# Patient Record
Sex: Male | Born: 1980 | Race: White | Hispanic: No | Marital: Single | Smoking: Former smoker
Health system: Southern US, Community
[De-identification: ages and names within clinical notes are randomized; demographics above are authoritative.]

## PROBLEM LIST (undated history)

## (undated) ENCOUNTER — Emergency Department (HOSPITAL_COMMUNITY): Admission: EM | Payer: Self-pay

## (undated) DIAGNOSIS — F99 Mental disorder, not otherwise specified: Secondary | ICD-10-CM

## (undated) DIAGNOSIS — K920 Hematemesis: Secondary | ICD-10-CM

## (undated) DIAGNOSIS — F329 Major depressive disorder, single episode, unspecified: Secondary | ICD-10-CM

## (undated) DIAGNOSIS — IMO0002 Reserved for concepts with insufficient information to code with codable children: Secondary | ICD-10-CM

## (undated) DIAGNOSIS — I10 Essential (primary) hypertension: Secondary | ICD-10-CM

## (undated) DIAGNOSIS — F32A Depression, unspecified: Secondary | ICD-10-CM

## (undated) HISTORY — PX: SHOULDER SURGERY: SHX246

## (undated) HISTORY — PX: WISDOM TOOTH EXTRACTION: SHX21

---

## 2001-12-02 ENCOUNTER — Emergency Department (HOSPITAL_COMMUNITY): Admission: EM | Admit: 2001-12-02 | Discharge: 2001-12-02 | Payer: Self-pay

## 2002-06-03 ENCOUNTER — Emergency Department (HOSPITAL_COMMUNITY): Admission: EM | Admit: 2002-06-03 | Discharge: 2002-06-03 | Payer: Self-pay | Admitting: Emergency Medicine

## 2002-06-03 ENCOUNTER — Encounter: Payer: Self-pay | Admitting: Emergency Medicine

## 2011-09-13 ENCOUNTER — Other Ambulatory Visit: Payer: Self-pay | Admitting: Physician Assistant

## 2011-09-14 NOTE — Telephone Encounter (Signed)
Please advise patient that he needs OV and labs for additional refills. csj

## 2011-09-15 NOTE — Telephone Encounter (Signed)
LMOM notifying patient msg.  Home number has been d/c'd.

## 2011-11-18 ENCOUNTER — Telehealth: Payer: Self-pay

## 2011-11-18 MED ORDER — METOPROLOL TARTRATE 50 MG PO TABS: 50.0000 mg | ORAL_TABLET | Freq: Two times a day (BID) | ORAL | Status: DC

## 2011-11-18 NOTE — Telephone Encounter (Signed)
GAVE PT MESSAGE

## 2011-11-18 NOTE — Telephone Encounter (Signed)
Pt not seen in epic. Pt came in to ask about bp rx refill. Last ov 10/01/10. Spoke with ryan dunn - pt is totally out of bp rx but cannot afford ov today. Alycia Rossetti stated to update pts info with preferred pharmacy and he would ok 1 month - before more pt will be required to see provider. Gave pt [facility] financial aid # for assistance and above information. Pt understands and appreciates help.   Pharmacy: [facility]

## 2011-11-18 NOTE — Telephone Encounter (Signed)
Med called in but we can not refill again without seeing.

## 2011-12-14 ENCOUNTER — Ambulatory Visit: Payer: Self-pay | Admitting: Emergency Medicine

## 2011-12-14 VITALS — BP 136/90 | HR 63 | Temp 98.0°F | Resp 16 | Ht 70.0 in | Wt 179.0 lb

## 2011-12-14 DIAGNOSIS — I10 Essential (primary) hypertension: Secondary | ICD-10-CM

## 2011-12-14 DIAGNOSIS — J329 Chronic sinusitis, unspecified: Secondary | ICD-10-CM

## 2011-12-14 DIAGNOSIS — R5383 Other fatigue: Secondary | ICD-10-CM

## 2011-12-14 DIAGNOSIS — K625 Hemorrhage of anus and rectum: Secondary | ICD-10-CM

## 2011-12-14 LAB — COMPREHENSIVE METABOLIC PANEL
ALT: 14 U/L (ref 0–53)
AST: 17 U/L (ref 0–37)
Albumin: 5.3 g/dL — ABNORMAL HIGH (ref 3.5–5.2)
Alkaline Phosphatase: 54 U/L (ref 39–117)
BUN: 12 mg/dL (ref 6–23)
CO2: 28 mEq/L (ref 19–32)
Calcium: 10 mg/dL (ref 8.4–10.5)
Chloride: 102 mEq/L (ref 96–112)
Creat: 0.8 mg/dL (ref 0.50–1.35)
Glucose, Bld: 86 mg/dL (ref 70–99)
Potassium: 4.4 mEq/L (ref 3.5–5.3)
Sodium: 140 mEq/L (ref 135–145)
Total Bilirubin: 0.4 mg/dL (ref 0.3–1.2)
Total Protein: 8 g/dL (ref 6.0–8.3)

## 2011-12-14 LAB — POCT CBC
Granulocyte percent: 78.3 %G (ref 37–80)
HCT, POC: 52.9 % (ref 43.5–53.7)
Hemoglobin: 18.2 g/dL — AB (ref 14.1–18.1)
Lymph, poc: 1.8 (ref 0.6–3.4)
MCH, POC: 31.7 pg — AB (ref 27–31.2)
MCHC: 34.4 g/dL (ref 31.8–35.4)
MCV: 92.2 fL (ref 80–97)
MID (cbc): 0.6 (ref 0–0.9)
MPV: 9.8 fL (ref 0–99.8)
POC Granulocyte: 8.5 — AB (ref 2–6.9)
POC LYMPH PERCENT: 16.6 %L (ref 10–50)
POC MID %: 5.1 %M (ref 0–12)
Platelet Count, POC: 257 10*3/uL (ref 142–424)
RBC: 5.74 M/uL (ref 4.69–6.13)
RDW, POC: 13.6 %
WBC: 10.9 10*3/uL — AB (ref 4.6–10.2)

## 2011-12-14 LAB — TSH: TSH: 0.69 u[IU]/mL (ref 0.350–4.500)

## 2011-12-14 LAB — IFOBT (OCCULT BLOOD): IFOBT: NEGATIVE

## 2011-12-14 MED ORDER — HYDROCORTISONE 2.5 % RE CREA: TOPICAL_CREAM | Freq: Two times a day (BID) | RECTAL | Status: AC

## 2011-12-14 MED ORDER — AMOXICILLIN 875 MG PO TABS: 875.0000 mg | ORAL_TABLET | Freq: Two times a day (BID) | ORAL | Status: AC

## 2011-12-14 NOTE — Progress Notes (Signed)
  Subjective:    Patient ID: Benjamin [name], male    DOB: 16-Apr-1981, 31 y.o.   MRN: [id number]  HPI patient enters with multiple complaints. First issue is he needs a refill of her pressure medication. Second issue is that the patient has had low energy level. He feels weak tired no energy. He feels like h and she recovered but he has continued to feel poorly.. he also has noted episodes of rectal bleeding over the last week or he states that when he wipes he sees bright red blood. These are associated with his bowel movements. e contracted a virus and his daughter also had    Review of Systemspatient is to history of heavy alcohol intake but not for a number of years.     Objective:   Physical Exam  Constitutional: He appears well-developed and well-nourished.  HENT:  Head: Normocephalic.  Eyes: Pupils are equal, round, and reactive to light.  Neck: No tracheal deviation present. No thyromegaly present.  Cardiovascular: Normal rate and regular rhythm.   Pulmonary/Chest: Effort normal and breath sounds normal. He has no wheezes. He has no rales.  Abdominal: Soft. He exhibits no distension. There is no tenderness.  Genitourinary:       Rectal exam reveals external hemorrhoids and internal hemorrhoids present. There are no masses felt on exam .    Results for orders placed in visit on 12/14/11  POCT CBC      Component Value Range   WBC 10.9 (*) 4.6 - 10.2 (K/uL)   Lymph, poc 1.8  0.6 - 3.4    POC LYMPH PERCENT 16.6  10 - 50 (%L)   MID (cbc) 0.6  0 - 0.9    POC MID % 5.1  0 - 12 (%M)   POC Granulocyte 8.5 (*) 2 - 6.9    Granulocyte percent 78.3  37 - 80 (%G)   RBC 5.74  4.69 - 6.13 (M/uL)   Hemoglobin 18.2 (*) 14.1 - 18.1 (g/dL)   HCT, POC 95.2  84.1 - 53.7 (%)   MCV 92.2  80 - 97 (fL)   MCH, POC 31.7 (*) 27 - 31.2 (pg)   MCHC 34.4  31.8 - 35.4 (g/dL)   RDW, POC 32.4     Platelet Count, POC 257  142 - 424 (K/uL)   MPV 9.8  0 - 99.8 (fL)  IFOBT (OCCULT BLOOD)      Component  Value Range   IFOBT Negative           assessement and plan        Patient most likely has a sinus infection. We'll treat with amoxicillin x10 days. We'll also give him some Anusol-HC cream to use for his rectal bleeding.

## 2011-12-15 ENCOUNTER — Encounter: Payer: Self-pay | Admitting: *Deleted

## 2011-12-17 ENCOUNTER — Other Ambulatory Visit: Payer: Self-pay

## 2011-12-17 NOTE — Telephone Encounter (Signed)
Pt came into 102 to ask about his RF for metoprolol. He was here for OV and his other Rxs were sent in to [facility], but the metoprolol that he needed new RFs for was not sent. Dr Cleta Alberts, do you want to send in RFs? I put in Rx #60 w/ 5 RFs if you want to OK that or change # of RFs and then sign?

## 2011-12-18 ENCOUNTER — Telehealth: Payer: Self-pay

## 2011-12-18 MED ORDER — METOPROLOL TARTRATE 50 MG PO TABS: 50.0000 mg | ORAL_TABLET | Freq: Two times a day (BID) | ORAL | Status: DC

## 2011-12-18 NOTE — Telephone Encounter (Signed)
Benjamin [name] is okay to refill his metoprolol tartrate 1 year.

## 2011-12-18 NOTE — Telephone Encounter (Signed)
Called [facility] and revised RFs that were sent over this morning to include 3 RFs instead of just 1 so that pt will have 1 yr of RFs.

## 2011-12-18 NOTE — Telephone Encounter (Signed)
Spoke w/pt who requests 90 day supply of metoprolol. Sent in Rx #90 1 RF to [facility]

## 2012-04-22 ENCOUNTER — Other Ambulatory Visit: Payer: Self-pay | Admitting: Physician Assistant

## 2012-06-06 ENCOUNTER — Emergency Department (HOSPITAL_COMMUNITY)
Admission: EM | Admit: 2012-06-06 | Discharge: 2012-06-06 | Disposition: A | Discharge status: Home / Self Care | Payer: Self-pay | Attending: Emergency Medicine | Admitting: Emergency Medicine

## 2012-06-06 ENCOUNTER — Encounter (HOSPITAL_COMMUNITY): Payer: Self-pay | Admitting: *Deleted

## 2012-06-06 ENCOUNTER — Emergency Department (HOSPITAL_COMMUNITY): Payer: Self-pay

## 2012-06-06 DIAGNOSIS — F411 Generalized anxiety disorder: Secondary | ICD-10-CM | POA: Insufficient documentation

## 2012-06-06 DIAGNOSIS — R072 Precordial pain: Secondary | ICD-10-CM | POA: Insufficient documentation

## 2012-06-06 DIAGNOSIS — I10 Essential (primary) hypertension: Secondary | ICD-10-CM | POA: Insufficient documentation

## 2012-06-06 DIAGNOSIS — R42 Dizziness and giddiness: Secondary | ICD-10-CM | POA: Insufficient documentation

## 2012-06-06 DIAGNOSIS — Z8739 Personal history of other diseases of the musculoskeletal system and connective tissue: Secondary | ICD-10-CM | POA: Insufficient documentation

## 2012-06-06 DIAGNOSIS — F17200 Nicotine dependence, unspecified, uncomplicated: Secondary | ICD-10-CM | POA: Insufficient documentation

## 2012-06-06 DIAGNOSIS — R9431 Abnormal electrocardiogram [ECG] [EKG]: Secondary | ICD-10-CM | POA: Insufficient documentation

## 2012-06-06 DIAGNOSIS — Z7982 Long term (current) use of aspirin: Secondary | ICD-10-CM | POA: Insufficient documentation

## 2012-06-06 DIAGNOSIS — Z79899 Other long term (current) drug therapy: Secondary | ICD-10-CM | POA: Insufficient documentation

## 2012-06-06 DIAGNOSIS — R002 Palpitations: Secondary | ICD-10-CM | POA: Insufficient documentation

## 2012-06-06 HISTORY — DX: Essential (primary) hypertension: I10

## 2012-06-06 HISTORY — DX: Reserved for concepts with insufficient information to code with codable children: IMO0002

## 2012-06-06 LAB — CBC WITH DIFFERENTIAL/PLATELET
Basophils Absolute: 0 10*3/uL (ref 0.0–0.1)
Eosinophils Absolute: 0.2 10*3/uL (ref 0.0–0.7)
Lymphocytes Relative: 18 % (ref 12–46)
Lymphs Abs: 1.9 10*3/uL (ref 0.7–4.0)
Neutrophils Relative %: 73 % (ref 43–77)
Platelets: 187 10*3/uL (ref 150–400)
RBC: 5.36 MIL/uL (ref 4.22–5.81)
RDW: 12.4 % (ref 11.5–15.5)
WBC: 10.4 10*3/uL (ref 4.0–10.5)

## 2012-06-06 LAB — BASIC METABOLIC PANEL
CO2: 25 mEq/L (ref 19–32)
GFR calc non Af Amer: >90 mL/min (ref >90)
Glucose, Bld: 104 mg/dL — ABNORMAL HIGH (ref 70–99)
Potassium: 4.1 mEq/L (ref 3.5–5.1)
Sodium: 137 mEq/L (ref 135–145)

## 2012-06-06 LAB — TROPONIN I: Troponin I: <0.3 ng/mL (ref <0.30)

## 2012-06-06 MED ORDER — ONDANSETRON HCL 4 MG/2ML IJ SOLN: 4.0000 mg | Freq: Once | INTRAMUSCULAR | Status: DC

## 2012-06-06 MED ORDER — ACETAMINOPHEN 325 MG PO TABS
650.0000 mg | ORAL_TABLET | Freq: Once | ORAL | Status: AC
  Administered 2012-06-06: 650 mg via ORAL

## 2012-06-06 MED ORDER — ACETAMINOPHEN 325 MG PO TABS
ORAL_TABLET | ORAL | Status: AC
  Filled 2012-06-06: qty 2

## 2012-06-06 MED ORDER — MECLIZINE HCL 25 MG PO TABS
25.0000 mg | ORAL_TABLET | Freq: Once | ORAL | Status: AC
  Administered 2012-06-06: 25 mg via ORAL
  Filled 2012-06-06: qty 1

## 2012-06-06 MED ORDER — OXYCODONE-ACETAMINOPHEN 5-325 MG PO TABS
1.0000 | ORAL_TABLET | Freq: Once | ORAL | Status: DC
  Filled 2012-06-06: qty 1

## 2012-06-06 NOTE — ED Notes (Signed)
Spoke with PA regarding patient CP. PA made aware of patient pain.

## 2012-06-06 NOTE — ED Provider Notes (Signed)
Patient states that for the past several weeks he felt "like I can't focus" when he moves his eyes he feels that the images not follow his eyes, though he denies blurred vision. He also reports anterior chest pain constant since awakening this morning, nonexertional and feeling of rapid heartbeat earlier today. On exam patient awake alert Glasgow Coma Score 15 heart regular rate and rhythm lungs clear auscultation neurologic cranial nerves 2 through 12 intact motor strength 5 over 5 overall finger to nose normal, tandem walk normal. Assessment chest pain highly atypical for acute coronary syndrome. Inability to "focus" may be suggestive of vertigo. Patient reports he feels like he is on a boat. MRI scan of brain offered which patient declines. I do not feel strongly the patient requires imaging. Neurologic referral given as outpatient  Doug Sou, MD 06/06/12 1625

## 2012-06-06 NOTE — ED Notes (Signed)
PT is here with lightheadedness, dizziness, and fast heart rate.  Pt sts started yesterday and states he has had this for years.

## 2012-06-06 NOTE — ED Provider Notes (Signed)
Medical screening examination/treatment/procedure(s) were conducted as a shared visit with non-physician practitioner(s) and myself.  I personally evaluated the patient during the encounter  Doug Sou, MD 06/06/12 1656

## 2012-06-06 NOTE — ED Notes (Signed)
Pt back from X-ray.  

## 2012-06-06 NOTE — ED Provider Notes (Signed)
History     CSN: [id number]  Arrival date & time 06/06/12  1044   First MD Initiated Contact with Patient 06/06/12 1116      Chief Complaint  Patient presents with  . Irregular Heart Beat  . Dizziness    (Consider location/radiation/quality/duration/timing/severity/associated sxs/prior treatment) HPI PCP: [facility]   Patient presents to the ER with complaints of dizziness for three days and heart palpitations when he woke up this morning. He says that he has felt dizzy as if he has been rocking on a boat without nausea and vomiting. He denies a history of the same. He admits that he has not been eating and drinking as much as he normally does and that he has a lot of stress "But not more than everyone else". He does not have a history of problems with anxiety.   He woke up this morning with substernal chest pain and his heart racing. The pains only lasted a few seconds as well as his heart racing. He admits he has had this before but has never gone to the hospital for it.   nad vss  Past Medical History  Diagnosis Date  . Herniated disc     x3  . Hypertension     Past Surgical History  Procedure Date  . Shoulder surgery     No family history on file.  History  Substance Use Topics  . Smoking status: Current Every Day Smoker -- 1.0 packs/day    Types: Cigarettes  . Smokeless tobacco: Not on file  . Alcohol Use: No      Review of Systems  Review of Systems  Gen: no weight loss, fevers, chills, night sweats  Eyes: no discharge or drainage, no occular pain or visual changes  Nose: no epistaxis or rhinorrhea  Mouth: no dental pain, no sore throat  Neck: no neck pain  Lungs:No wheezing, coughing or hemoptysis CV: + chest pains, + heart racing. no palpitations, dependent edema or orthopnea  Abd: no abdominal pain, nausea, vomiting  GU: no dysuria or gross hematuria  MSK:  No abnormalities  Neuro: no headache, no focal neurologic deficits, +  dizziness Skin: no abnormalities Psyche: negative.   Allergies  Review of patient's allergies indicates no known allergies.  Home Medications   Current Outpatient Rx  Name  Route  Sig  Dispense  Refill  . ASPIRIN EC 81 MG PO TBEC   Oral   Take 81 mg by mouth daily.         Marland Kitchen METOPROLOL TARTRATE 50 MG PO TABS      TAKE 1 TABLET BY MOUTH TWICE A DAY   60 tablet   2     BP 132/92  Pulse 62  Temp 97.9 F (36.6 C) (Oral)  Resp 14  SpO2 99%  Physical Exam  Nursing note and vitals reviewed. Constitutional: He is oriented to person, place, and time. He appears well-developed and well-nourished. No distress.  HENT:  Head: Normocephalic and atraumatic.  Eyes: Pupils are equal, round, and reactive to light.  Neck: Normal range of motion. Neck supple.  Cardiovascular: Normal rate and regular rhythm.   Pulmonary/Chest: Effort normal.  Abdominal: Soft.  Neurological: He is alert and oriented to person, place, and time. He has normal strength. No cranial nerve deficit or sensory deficit. He displays a negative Romberg sign.  Skin: Skin is warm and dry.    ED Course  Procedures (including critical care time)  Labs Reviewed  CBC WITH DIFFERENTIAL - Abnormal; Notable for the following:    Hemoglobin 17.1 (*)     MCHC 36.1 (*)     All other components within normal limits  BASIC METABOLIC PANEL - Abnormal; Notable for the following:    Glucose, Bld 104 (*)     All other components within normal limits  TROPONIN I   Dg Chest 2 View  06/06/2012  *RADIOLOGY REPORT*  Clinical Data: Sensation of palpitations  CHEST - 2 VIEW  Comparison: None  Findings: The heart size and mediastinal contours are within normal limits.  Both lungs are clear.  The visualized skeletal structures are unremarkable.  IMPRESSION: Negative exam.   Original Report Authenticated By: Signa Kell, M.D.      1. Dizziness       MDM   Date: 06/06/2012  Rate: 95  Rhythm: normal sinus rhythm  QRS  Axis: normal  Intervals: normal  ST/T Wave abnormalities: normal  Conduction Disutrbances:incomplete right bundle branch block  Narrative Interpretation: possible left atrial abnormality.  Old EKG Reviewed: none available  Pt given 1 L of fluids in the ER and Antivert without any resolution but his dizziness he describes as mild. He is ambulatory without difficulty and seems anxious. Dr. Ethelda Chick has seen patient has well and feels that the patient does not need any more imaging. He can come back to the ER and or follow-up with the PCP. Pt agrees with plan and is comfortable with it.  Pt has been advised of the symptoms that warrant their return to the ED. Patient has voiced understanding and has agreed to follow-up with the PCP or specialist.         Dorthula Matas, PA 06/06/12 1545

## 2012-06-06 NOTE — ED Notes (Signed)
Pt states he woke up and almost fell out of bed d/t lightheadedness. Reported 6/10 achey cp to central chest. C/o blurred vision, persistent lightheadedness, weak. Pt A&Ox4, ambulatory.

## 2012-09-05 ENCOUNTER — Emergency Department (HOSPITAL_COMMUNITY): Payer: Self-pay

## 2012-09-05 ENCOUNTER — Encounter (HOSPITAL_COMMUNITY): Payer: Self-pay

## 2012-09-05 ENCOUNTER — Emergency Department (HOSPITAL_COMMUNITY)
Admission: EM | Admit: 2012-09-05 | Discharge: 2012-09-07 | Disposition: A | Discharge status: Other Acute Inpatient Hospital | Payer: Self-pay | Attending: Emergency Medicine | Admitting: Emergency Medicine

## 2012-09-05 DIAGNOSIS — Z8739 Personal history of other diseases of the musculoskeletal system and connective tissue: Secondary | ICD-10-CM | POA: Insufficient documentation

## 2012-09-05 DIAGNOSIS — I10 Essential (primary) hypertension: Secondary | ICD-10-CM | POA: Insufficient documentation

## 2012-09-05 DIAGNOSIS — E860 Dehydration: Secondary | ICD-10-CM | POA: Insufficient documentation

## 2012-09-05 DIAGNOSIS — F329 Major depressive disorder, single episode, unspecified: Secondary | ICD-10-CM

## 2012-09-05 DIAGNOSIS — F29 Unspecified psychosis not due to a substance or known physiological condition: Secondary | ICD-10-CM | POA: Insufficient documentation

## 2012-09-05 DIAGNOSIS — F17200 Nicotine dependence, unspecified, uncomplicated: Secondary | ICD-10-CM | POA: Insufficient documentation

## 2012-09-05 DIAGNOSIS — F061 Catatonic disorder due to known physiological condition: Secondary | ICD-10-CM

## 2012-09-05 DIAGNOSIS — R29818 Other symptoms and signs involving the nervous system: Secondary | ICD-10-CM | POA: Insufficient documentation

## 2012-09-05 DIAGNOSIS — F1910 Other psychoactive substance abuse, uncomplicated: Secondary | ICD-10-CM | POA: Insufficient documentation

## 2012-09-05 DIAGNOSIS — F3289 Other specified depressive episodes: Secondary | ICD-10-CM | POA: Insufficient documentation

## 2012-09-05 LAB — COMPREHENSIVE METABOLIC PANEL
ALT: 19 U/L (ref 0–53)
AST: 29 U/L (ref 0–37)
Alkaline Phosphatase: 44 U/L (ref 39–117)
CO2: 28 mEq/L (ref 19–32)
Calcium: 10.1 mg/dL (ref 8.4–10.5)
Chloride: 100 mEq/L (ref 96–112)
GFR calc Af Amer: >90 mL/min (ref >90)
GFR calc non Af Amer: 87 mL/min — ABNORMAL LOW (ref >90)
Glucose, Bld: 89 mg/dL (ref 70–99)
Potassium: 4.6 mEq/L (ref 3.5–5.1)
Sodium: 145 mEq/L (ref 135–145)
Total Bilirubin: 1.2 mg/dL (ref 0.3–1.2)

## 2012-09-05 LAB — CBC WITH DIFFERENTIAL/PLATELET
Basophils Absolute: 0 10*3/uL (ref 0.0–0.1)
Eosinophils Relative: 1 % (ref 0–5)
Lymphocytes Relative: 20 % (ref 12–46)
Lymphs Abs: 1.3 10*3/uL (ref 0.7–4.0)
MCV: 90.4 fL (ref 78.0–100.0)
Neutro Abs: 4.6 10*3/uL (ref 1.7–7.7)
Platelets: 129 10*3/uL — ABNORMAL LOW (ref 150–400)
RBC: 6.24 MIL/uL — ABNORMAL HIGH (ref 4.22–5.81)
WBC: 6.5 10*3/uL (ref 4.0–10.5)

## 2012-09-05 LAB — URINALYSIS, ROUTINE W REFLEX MICROSCOPIC
Hgb urine dipstick: NEGATIVE
Ketones, ur: 40 mg/dL — AB
Nitrite: POSITIVE — AB
Protein, ur: 100 mg/dL — AB
Urobilinogen, UA: 1 mg/dL (ref 0.0–1.0)

## 2012-09-05 LAB — URINE MICROSCOPIC-ADD ON

## 2012-09-05 LAB — RAPID URINE DRUG SCREEN, HOSP PERFORMED
Barbiturates: NOT DETECTED
Cocaine: NOT DETECTED
Tetrahydrocannabinol: NOT DETECTED

## 2012-09-05 LAB — AMMONIA: Ammonia: 22 umol/L (ref 11–60)

## 2012-09-05 LAB — ETHANOL: Alcohol, Ethyl (B): <11 mg/dL (ref 0–11)

## 2012-09-05 MED ORDER — LORAZEPAM 2 MG/ML IJ SOLN
1.0000 mg | Freq: Once | INTRAMUSCULAR | Status: AC
  Administered 2012-09-05: 1 mg via INTRAVENOUS
  Filled 2012-09-05: qty 1

## 2012-09-05 MED ORDER — SODIUM CHLORIDE 0.9 % IV SOLN
INTRAVENOUS | Status: DC
  Administered 2012-09-05: 19:00:00 via INTRAVENOUS

## 2012-09-05 MED ORDER — ZIPRASIDONE MESYLATE 20 MG IM SOLR
10.0000 mg | Freq: Once | INTRAMUSCULAR | Status: AC
  Administered 2012-09-05: 10 mg via INTRAMUSCULAR
  Filled 2012-09-05: qty 20

## 2012-09-05 MED ORDER — SODIUM CHLORIDE 0.9 % IV BOLUS (SEPSIS)
1000.0000 mL | Freq: Once | INTRAVENOUS | Status: AC
  Administered 2012-09-05: 1000 mL via INTRAVENOUS

## 2012-09-05 NOTE — ED Notes (Signed)
Portable X-ray

## 2012-09-05 NOTE — ED Notes (Addendum)
Per EMS stated could not get a lot of information from wife. Patient is in a normal reclusive state, does not blink eyes, has a blank look to face this is all normal since Thanksgiving related to drug abuse. Today what is different patient could not or would not walk.

## 2012-09-05 NOTE — ED Notes (Signed)
ZOX:WRUE<AV> Expected date:09/05/12<BR> Expected time: 6:15 PM<BR> Means of arrival:<BR> Comments:<BR> htn

## 2012-09-05 NOTE — ED Provider Notes (Signed)
History     CSN: [id number]  Arrival date & time 09/05/12  8657   First MD Initiated Contact with Patient 09/05/12 1830      Chief Complaint  Patient presents with  . Weakness  level 5 caveat due to altered mental status.  (Consider location/radiation/quality/duration/timing/severity/associated sxs/prior treatment) Patient is a 32 y.o. male presenting with weakness. The history is provided by the patient.  Weakness  Additional symptoms include weakness.   patient was brought in for decreased mental status. Reportedly has been staring and minimally verbal since Thanksgiving. Reportedly does walk from her house and recent vital. Reportedly was due to drug abuse.  Today the patient is not walking. It is unclear if he has seen anyone for the symptoms until now.  Past Medical History  Diagnosis Date  . Herniated disc     x3  . Hypertension     Past Surgical History  Procedure Date  . Shoulder surgery     No family history on file.  History  Substance Use Topics  . Smoking status: Current Every Day Smoker -- 1.0 packs/day    Types: Cigarettes  . Smokeless tobacco: Not on file  . Alcohol Use: No      Review of Systems  Unable to perform ROS: Mental status change  Neurological: Positive for weakness.    Allergies  Review of patient's allergies indicates no known allergies.  Home Medications   Current Outpatient Rx  Name  Route  Sig  Dispense  Refill  . ASPIRIN EC 81 MG PO TBEC   Oral   Take 81 mg by mouth daily.         Marland Kitchen METOPROLOL TARTRATE 50 MG PO TABS      TAKE 1 TABLET BY MOUTH TWICE A DAY   60 tablet   2     BP 130/99  Pulse 73  Temp 98.6 F (37 C) (Oral)  Resp 14  SpO2 100%  Physical Exam  Constitutional: He appears well-developed and well-nourished.  HENT:  Head: Normocephalic.  Eyes: Pupils are equal, round, and reactive to light.       Patient will not look to voice. He does blink to visual threat.  Neck: Neck supple.   Cardiovascular: Normal rate and regular rhythm.   Pulmonary/Chest: Effort normal and breath sounds normal.  Abdominal: Soft. Bowel sounds are normal.  Musculoskeletal: Normal range of motion.  Neurological:       Patient is staring he will look around the room, however not necessary to voice. He is no response to pain. He will stop his right hand from hitting his face when it is held above his head and released. His left hand will him on the head. Toes are downgoing bilaterally. Normal patellar reflexes bilaterally. He does not have a gag reflex. He is breathing spontaneously..   Skin: Skin is warm.    ED Course  Procedures (including critical care time)  Labs Reviewed  CBC WITH DIFFERENTIAL - Abnormal; Notable for the following:    RBC 6.24 (*)     Hemoglobin 19.7 (*)     HCT 56.4 (*)     Platelets 129 (*)     All other components within normal limits  COMPREHENSIVE METABOLIC PANEL - Abnormal; Notable for the following:    BUN 36 (*)     Total Protein 8.4 (*)     GFR calc non Af Amer 87 (*)     All other components within normal limits  URINALYSIS, ROUTINE  W REFLEX MICROSCOPIC - Abnormal; Notable for the following:    Color, Urine ORANGE (*)  BIOCHEMICALS MAY BE AFFECTED BY COLOR   APPearance TURBID (*)     Specific Gravity, Urine 1.040 (*)     Bilirubin Urine LARGE (*)     Ketones, ur 40 (*)     Protein, ur 100 (*)     Nitrite POSITIVE (*)     All other components within normal limits  URINE MICROSCOPIC-ADD ON - Abnormal; Notable for the following:    Bacteria, UA MANY (*)     All other components within normal limits  URINE RAPID DRUG SCREEN (HOSP PERFORMED)  ETHANOL  AMMONIA  TSH   Ct Head Wo Contrast  09/05/2012  *RADIOLOGY REPORT*  Clinical Data: Altered mental status.  Combative.  CT HEAD WITHOUT CONTRAST  Technique:  Contiguous axial images were obtained from the base of the skull through the vertex without contrast.  Comparison: None.  Findings: No skull fracture  or intracranial hemorrhage.  No hydrocephalus.  No CT evidence of large acute infarct.  No intracranial mass lesion detected on this unenhanced exam.  Mastoid air cells, middle ear cavities and visualized sinuses are clear.  IMPRESSION: No acute abnormality.  Please see above.   Original Report Authenticated By: Lacy Duverney, M.D.    Dg Chest Port 1 View  09/05/2012  *RADIOLOGY REPORT*  Clinical Data: Altered mental status.  PORTABLE CHEST - 1 VIEW  Comparison: 06/06/2012.  Findings:  Cardiopericardial silhouette within normal limits. Mediastinal contours normal. Trachea midline.  No airspace disease or effusion.  Prominent vessel is present in the right midlung. Monitoring leads are projected over the chest.  IMPRESSION: No active cardiopulmonary disease.   Original Report Authenticated By: Andreas Newport, M.D.      1. Catatonia   2. Dehydration   3. Substance abuse       MDM  Patient presents with altered mental status. He has reportedly been confused and paranoid since Thanksgiving. Initially he was unresponsive and somewhat catatonic. Since then he is improved and will now speak. Per his wife/girlfriend he has been using bath salts and other synthetic drugs. She states it is at $80,000 on it. He is reportedly been paranoid not eating. Laboratory was some dehydration. Urine drug screen is negative. Patient initially did not have a gag reflex. Later he was taken to CT scan to not tolerate it. He woke up and moved around. 10 mg of IM Geodon and 2 mg of IV Ativan were given. His mental status and improved. He became verbal. His wife states he has not spoken in a couple months. Patient's been given more IV fluids. Started it here. He will get a telepsych consult. He is not taking care of himself. His wife states he will likely die or to himself. At this point I think he is not psychiatrically cleared and if he tries to leave he'll need an involuntary commitment.        Benjamin [name]. Benjamin Payor,  MD 09/05/12 2326

## 2012-09-05 NOTE — ED Notes (Addendum)
Pt's ex-girlfriend has arrived to ED. States that pt does "fake weed" and "bathsalts" and she suspects he needs psychological evaluation. States that pt is paranoid, locks himself in his home and that he didn't even attend the birth of his child 4 months ago. Prior to Sept girlfriend states that pt was very involved and interested in his children. [place], [employer]

## 2012-09-05 NOTE — ED Notes (Signed)
telepsych information faxed, call placed to call service and awaiting MD call back

## 2012-09-06 MED ORDER — ESCITALOPRAM OXALATE 10 MG PO TABS
10.0000 mg | ORAL_TABLET | Freq: Every day | ORAL | Status: DC
  Filled 2012-09-06: qty 1

## 2012-09-06 MED ORDER — LORAZEPAM 1 MG PO TABS: 2.0000 mg | ORAL_TABLET | Freq: Four times a day (QID) | ORAL | Status: DC | PRN

## 2012-09-06 MED ORDER — ALUM & MAG HYDROXIDE-SIMETH 200-200-20 MG/5ML PO SUSP: 30.0000 mL | ORAL | Status: DC | PRN

## 2012-09-06 MED ORDER — ACETAMINOPHEN 325 MG PO TABS: 650.0000 mg | ORAL_TABLET | ORAL | Status: DC | PRN

## 2012-09-06 MED ORDER — ZIPRASIDONE MESYLATE 20 MG IM SOLR: 20.0000 mg | Freq: Two times a day (BID) | INTRAMUSCULAR | Status: DC | PRN

## 2012-09-06 MED ORDER — NICOTINE 21 MG/24HR TD PT24: 21.0000 mg | MEDICATED_PATCH | Freq: Every day | TRANSDERMAL | Status: DC | PRN

## 2012-09-06 MED ORDER — LORAZEPAM 2 MG/ML IJ SOLN
2.0000 mg | Freq: Four times a day (QID) | INTRAMUSCULAR | Status: DC | PRN
  Filled 2012-09-06: qty 1

## 2012-09-06 MED ORDER — ONDANSETRON HCL 4 MG PO TABS: 4.0000 mg | ORAL_TABLET | Freq: Three times a day (TID) | ORAL | Status: DC | PRN

## 2012-09-06 NOTE — ED Notes (Signed)
Went in to assess patient again he would not answer any questions, looks into face/eyes but makes no statement

## 2012-09-06 NOTE — ED Provider Notes (Signed)
Telepsych eval completed by Dr. Jacky Kindle:  States pt has multiple vegetative signs/symptoms assoc with depression as well as paranoia, he believes he is being tracked by cellphones and that god is speaking to him, he has quit driving, maintaining his hygiene and not working.  Recommends IVC and inpatient psych unit admission, recommends to start:  lexapro 10mg  PO qdaily, ativan 2mg  PO/IM q6h prn anxiety, geodon 20mg  IM q12h prn agitation/psychosis.  IVC paperwork completed.  ACT/Social Worker will find placement.   Laray Anger, DO 09/06/12 [phone]

## 2012-09-06 NOTE — [facility] Counselor (Signed)
Pt pending review at [facility].  Evette Cristal, [place] Assessment Counselor

## 2012-09-06 NOTE — ED Notes (Signed)
Patient informed me that he is a vegetarian

## 2012-09-06 NOTE — ED Notes (Signed)
Patient continues to refuse to speak to staff, will shake head but will not speak.

## 2012-09-06 NOTE — ED Notes (Addendum)
Tried to do assessment, patient would not state his name, why he was here nor where he is right now. Did look out in the hallway at the ceiling trying to decide whether or not to speak, did not speak.

## 2012-09-06 NOTE — ED Notes (Addendum)
Refused to speak to mother, she was very tearful, left number for him to call. Given meal tray.

## 2012-09-06 NOTE — ED Notes (Signed)
Pt wanded by security and placed in blue scrubs.
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
## 2012-09-06 NOTE — ED Provider Notes (Signed)
Site looked through patient's labs and states he has not medically clear and that he is to hydrated and has urinary tract infection needs IV fluids and antibiotics. However I disagree with this. Patient has not been eating or drinking much and is heme concentrated few ketones in his urine however her urine is positive for nitrites but negative for white blood cells. Will recheck a UA and encourage by mouth fluids.  Vital signs have been stable without tachycardia and blood pressure in the low 100s which is unchanged.  Will hold metoprolol currently.  Gwyneth Sprout, MD 09/06/12 [id number]

## 2012-09-06 NOTE — ED Notes (Signed)
Belongings bag x 1 placed in locker #37.

## 2012-09-07 ENCOUNTER — Inpatient Hospital Stay (HOSPITAL_COMMUNITY)
Admission: AD | Admit: 2012-09-07 | Discharge: 2012-09-29 | DRG: 885 | Disposition: A | Discharge status: Home / Self Care | Payer: No Typology Code available for payment source | Source: Intra-hospital | Attending: Emergency Medicine | Admitting: Emergency Medicine

## 2012-09-07 ENCOUNTER — Encounter (HOSPITAL_COMMUNITY): Payer: Self-pay | Admitting: Behavioral Health

## 2012-09-07 DIAGNOSIS — F333 Major depressive disorder, recurrent, severe with psychotic symptoms: Principal | ICD-10-CM | POA: Diagnosis present

## 2012-09-07 DIAGNOSIS — F329 Major depressive disorder, single episode, unspecified: Secondary | ICD-10-CM

## 2012-09-07 DIAGNOSIS — F1911 Other psychoactive substance abuse, in remission: Secondary | ICD-10-CM | POA: Diagnosis present

## 2012-09-07 DIAGNOSIS — F940 Selective mutism: Secondary | ICD-10-CM | POA: Diagnosis not present

## 2012-09-07 DIAGNOSIS — I10 Essential (primary) hypertension: Secondary | ICD-10-CM | POA: Diagnosis present

## 2012-09-07 DIAGNOSIS — Z79899 Other long term (current) drug therapy: Secondary | ICD-10-CM

## 2012-09-07 DIAGNOSIS — F29 Unspecified psychosis not due to a substance or known physiological condition: Secondary | ICD-10-CM

## 2012-09-07 DIAGNOSIS — R001 Bradycardia, unspecified: Secondary | ICD-10-CM | POA: Diagnosis not present

## 2012-09-07 DIAGNOSIS — Z765 Malingerer [conscious simulation]: Secondary | ICD-10-CM

## 2012-09-07 DIAGNOSIS — F22 Delusional disorders: Secondary | ICD-10-CM | POA: Diagnosis present

## 2012-09-07 DIAGNOSIS — R638 Other symptoms and signs concerning food and fluid intake: Secondary | ICD-10-CM | POA: Diagnosis not present

## 2012-09-07 DIAGNOSIS — T730XXA Starvation, initial encounter: Secondary | ICD-10-CM

## 2012-09-07 HISTORY — DX: Major depressive disorder, single episode, unspecified: F32.9

## 2012-09-07 HISTORY — DX: Mental disorder, not otherwise specified: F99

## 2012-09-07 HISTORY — DX: Depression, unspecified: F32.A

## 2012-09-07 LAB — URINALYSIS, ROUTINE W REFLEX MICROSCOPIC
Glucose, UA: NEGATIVE mg/dL
Hgb urine dipstick: NEGATIVE
Leukocytes, UA: NEGATIVE
Protein, ur: NEGATIVE mg/dL
Specific Gravity, Urine: 1.004 — ABNORMAL LOW (ref 1.005–1.030)
Urobilinogen, UA: 0.2 mg/dL (ref 0.0–1.0)

## 2012-09-07 MED ORDER — LORAZEPAM 2 MG/ML IJ SOLN
1.0000 mg | Freq: Once | INTRAMUSCULAR | Status: AC
  Administered 2012-09-07: 1 mg via INTRAMUSCULAR

## 2012-09-07 MED ORDER — AMMONIA AROMATIC IN INHA
RESPIRATORY_TRACT | Status: AC
  Administered 2012-09-07: 13:00:00
  Filled 2012-09-07: qty 20

## 2012-09-07 MED ORDER — ESCITALOPRAM OXALATE 10 MG PO TABS: 10.0000 mg | ORAL_TABLET | Freq: Every day | ORAL | Status: DC

## 2012-09-07 MED ORDER — CIPROFLOXACIN HCL 250 MG PO TABS: 250.0000 mg | ORAL_TABLET | Freq: Two times a day (BID) | ORAL | Status: DC

## 2012-09-07 MED ORDER — LORAZEPAM 2 MG/ML IJ SOLN
1.0000 mg | Freq: Three times a day (TID) | INTRAMUSCULAR | Status: DC
  Filled 2012-09-07: qty 1

## 2012-09-07 MED ORDER — ALUM & MAG HYDROXIDE-SIMETH 200-200-20 MG/5ML PO SUSP: 30.0000 mL | ORAL | Status: DC | PRN

## 2012-09-07 MED ORDER — LORAZEPAM 2 MG/ML IJ SOLN
1.0000 mg | Freq: Three times a day (TID) | INTRAMUSCULAR | Status: DC
  Administered 2012-09-07 (×2): 1 mg via INTRAMUSCULAR
  Filled 2012-09-07: qty 1

## 2012-09-07 MED ORDER — MAGNESIUM HYDROXIDE 400 MG/5ML PO SUSP
30.0000 mL | Freq: Every day | ORAL | Status: DC | PRN
  Filled 2012-09-07: qty 30

## 2012-09-07 MED ORDER — NICOTINE 21 MG/24HR TD PT24: 21.0000 mg | MEDICATED_PATCH | Freq: Every day | TRANSDERMAL | Status: DC

## 2012-09-07 MED ORDER — ACETAMINOPHEN 325 MG PO TABS: 650.0000 mg | ORAL_TABLET | Freq: Four times a day (QID) | ORAL | Status: DC | PRN

## 2012-09-07 NOTE — ED Notes (Addendum)
Pt no  responsive to deep sternal rub as well as deep stimuli Dr Rockwell Alexandria aware.Mother called wanted to talk to EDMD # given to Dr Epifanio Lesches [phone] mother lives in [place] Mother stated that she has not talked to her son more than a week she is insisting to talk to her but son is catatonic No information given, just stated that son is unable to talk

## 2012-09-07 NOTE — ED Provider Notes (Signed)
Benjamin [name] is a 32 y.o. male who is here for evaluation of depression, vegetative symptoms, reclusive behavior and stated, delusions; he has been seen by Telepsych , who recommended starting Lexapro, and admit him to a psychiatric facility. 0830 He has been medically cleared here. TSH is normal. Urinalysis to be repeated today.  Exam- the patient appears alert. His eyes are open. He does not respond verbally or follow commands. Abdomen is soft and nontender. There is no mass in the suprapubic region. Muscular strength-he has normal tone. There is no flaccidity, rigidity. When his arms are dropped onto his face he appears to move the arm to avoid hitting his face. Oral muscle mass, asymmetry. Psychiatric-he appears depressed  He was confronted with ammonia, in each nostril; and exhibited facial grimacing and tearing, but do not remove the capsules from his nose.  [facility] is seeking placement.  12:25- the nurse is concerned that he is catatonic. I discussed the case with Dr. Elsie Saas. The patient was given Ativan 1 mg, to see if it improves his catatonic state.  16:05- the patient is now alert, and responsive, although he only speaks minimally, in answers to questions. He drank some milk, got up and went to the bathroom, then returned to his room. He asked for more milk. We will also try to get him to drink some Ensure. It will be important for him to get as close to 2000 calories in each day. I ordered Ativan, IM, q. 8 hours. Dr. Elsie Saas, would like to continue to manage him in the psychiatric ED, for now.    Flint Melter, MD 09/07/12 [phone]

## 2012-09-07 NOTE — Tx Team (Signed)
Initial Interdisciplinary Treatment Plan  PATIENT STRENGTHS: (choose at least two) Average or above average intelligence Capable of independent living General fund of knowledge Religious Affiliation Supportive family/friends  PATIENT STRESSORS: Health problems Legal issue Marital or family conflict Traumatic event   PROBLEM LIST: Problem List/Patient Goals Date to be addressed Date deferred Reason deferred Estimated date of resolution  Psychosis 09/07/2012     Anxiety 09/07/2012     Legal issues 09/07/2012                                          DISCHARGE CRITERIA:  Ability to meet basic life and health needs Adequate post-discharge living arrangements Improved stabilization in mood, thinking, and/or behavior Medical problems require only outpatient monitoring Reduction of life-threatening or endangering symptoms to within safe limits Safe-care adequate arrangements made  PRELIMINARY DISCHARGE PLAN: Attend aftercare/continuing care group Outpatient therapy Return to previous living arrangement  PATIENT/FAMIILY INVOLVEMENT: This treatment plan has been presented to and reviewed with the patient, Benjamin [name].  The patient and family have been given the opportunity to ask questions and make suggestions.  Angeline Slim M 09/07/2012, 11:44 PM

## 2012-09-07 NOTE — ED Notes (Signed)
Called report to [facility] [place], nurse Selena Batten will receive patient.  Call out to [employer] to escort patient to [facility]. Vital signs within normal limits. Awaiting officer for escort.

## 2012-09-07 NOTE — ED Notes (Signed)
Pt is catatonic refusing to eat took small sips of h20 but refusing to take any more.

## 2012-09-07 NOTE — ED Notes (Signed)
Pt was stimulated with NH3 with minimal results opening eyes and tearing no responds to verbal stimuli

## 2012-09-07 NOTE — Consult Note (Signed)
Reason for Consult: Depression with catatonia, and history of substance abuse Referring Physician: Dr. Roxy Cedar is an 32 y.o. male.  HPI: Patient was seen and chart reviewed. Patient came to the [facility] long emergency department with the involuntary commitment petitioned filed by his girlfriend and reportedly he was not feeding himself for 29 days and the drinking fluids for at least 10 days. Patient urine analysis showed ketones on nitrates as indication of for dehydration and fasting. Case was discussed with the ER physician and than recommended Ativan 1 mg intramuscular for catatonia. Patient was responded to this medication able to look at people directly blink his eyes move his head and able to take a sip of water, and the milk. Patient was able to walk without assistance to the restroom and able to use himself and than went back to his room. Reportedly patient has been catatonic, not responding until medication was given. Patient was not able to look in your eyes move any part of his body voluntarily. When his the upper extremities were held up, which has fallen down on his face and everywhere else without resistance or manipulation. Patient does not have rigid motor or waxy flexibility. Patient was unable to provide verbal history, but nodded his head saying is not feeling well and denied suicidal or homicidal ideation.  MSE: Patient was catatonic, but positively responded to intramuscular Ativan given during this afternoon. Patient will be admitted acute psychiatric floor for closer monitoring and appropriate treatment for the treatment.  Past Medical History  Diagnosis Date  . Herniated disc     x3  . Hypertension     Past Surgical History  Procedure Date  . Shoulder surgery     No family history on file.  Social History:  reports that he has been smoking Cigarettes.  He has been smoking about 1 pack per day. He does not have any smokeless tobacco history on file. He  reports that he does not drink alcohol or use illicit drugs.  Allergies: No Known Allergies  Medications: I have reviewed the patient's current medications.  Results for orders placed during the hospital encounter of 09/05/12 (from the past 48 hour(s))  CBC WITH DIFFERENTIAL     Status: Abnormal   Collection Time   09/05/12  6:30 PM      Component Value Range Comment   WBC 6.5  4.0 - 10.5 K/uL    RBC 6.24 (*) 4.22 - 5.81 MIL/uL    Hemoglobin 19.7 (*) 13.0 - 17.0 g/dL    HCT 16.1 (*) 09.6 - 52.0 %    MCV 90.4  78.0 - 100.0 fL    MCH 31.6  26.0 - 34.0 pg    MCHC 34.9  30.0 - 36.0 g/dL    RDW 04.5  40.9 - 81.1 %    Platelets 129 (*) 150 - 400 K/uL    Neutrophils Relative 71  43 - 77 %    Neutro Abs 4.6  1.7 - 7.7 K/uL    Lymphocytes Relative 20  12 - 46 %    Lymphs Abs 1.3  0.7 - 4.0 K/uL    Monocytes Relative 8  3 - 12 %    Monocytes Absolute 0.5  0.1 - 1.0 K/uL    Eosinophils Relative 1  0 - 5 %    Eosinophils Absolute 0.0  0.0 - 0.7 K/uL    Basophils Relative 0  0 - 1 %    Basophils Absolute 0.0  0.0 -  0.1 K/uL   COMPREHENSIVE METABOLIC PANEL     Status: Abnormal   Collection Time   09/05/12  6:30 PM      Component Value Range Comment   Sodium 145  135 - 145 mEq/L    Potassium 4.6  3.5 - 5.1 mEq/L    Chloride 100  96 - 112 mEq/L    CO2 28  19 - 32 mEq/L    Glucose, Bld 89  70 - 99 mg/dL    BUN 36 (*) 6 - 23 mg/dL    Creatinine, Ser 1.61  0.50 - 1.35 mg/dL    Calcium 09.6  8.4 - 10.5 mg/dL    Total Protein 8.4 (*) 6.0 - 8.3 g/dL    Albumin 5.0  3.5 - 5.2 g/dL    AST 29  0 - 37 U/L    ALT 19  0 - 53 U/L    Alkaline Phosphatase 44  39 - 117 U/L    Total Bilirubin 1.2  0.3 - 1.2 mg/dL    GFR calc non Af Amer 87 (*) >90 mL/min    GFR calc Af Amer >90  >90 mL/min   ETHANOL     Status: Normal   Collection Time   09/05/12  6:30 PM      Component Value Range Comment   Alcohol, Ethyl (B) <11  0 - 11 mg/dL   TSH     Status: Normal   Collection Time   09/05/12  6:30 PM       Component Value Range Comment   TSH 0.802  0.350 - 4.500 uIU/mL   AMMONIA     Status: Normal   Collection Time   09/05/12  6:47 PM      Component Value Range Comment   Ammonia 22  11 - 60 umol/L   URINALYSIS, ROUTINE W REFLEX MICROSCOPIC     Status: Abnormal   Collection Time   09/05/12  7:07 PM      Component Value Range Comment   Color, Urine ORANGE (*) YELLOW BIOCHEMICALS MAY BE AFFECTED BY COLOR   APPearance TURBID (*) CLEAR    Specific Gravity, Urine 1.040 (*) 1.005 - 1.030    pH 5.5  5.0 - 8.0    Glucose, UA NEGATIVE  NEGATIVE mg/dL    Hgb urine dipstick NEGATIVE  NEGATIVE    Bilirubin Urine LARGE (*) NEGATIVE    Ketones, ur 40 (*) NEGATIVE mg/dL    Protein, ur 045 (*) NEGATIVE mg/dL    Urobilinogen, UA 1.0  0.0 - 1.0 mg/dL    Nitrite POSITIVE (*) NEGATIVE    Leukocytes, UA NEGATIVE  NEGATIVE   URINE RAPID DRUG SCREEN (HOSP PERFORMED)     Status: Normal   Collection Time   09/05/12  7:07 PM      Component Value Range Comment   Opiates NONE DETECTED  NONE DETECTED    Cocaine NONE DETECTED  NONE DETECTED    Benzodiazepines NONE DETECTED  NONE DETECTED    Amphetamines NONE DETECTED  NONE DETECTED    Tetrahydrocannabinol NONE DETECTED  NONE DETECTED    Barbiturates NONE DETECTED  NONE DETECTED   URINE MICROSCOPIC-ADD ON     Status: Abnormal   Collection Time   09/05/12  7:07 PM      Component Value Range Comment   WBC, UA 0-2  <3 WBC/hpf    Bacteria, UA MANY (*) RARE    Urine-Other MUCOUS PRESENT       Ct Head  Wo Contrast  09/05/2012  *RADIOLOGY REPORT*  Clinical Data: Altered mental status.  Combative.  CT HEAD WITHOUT CONTRAST  Technique:  Contiguous axial images were obtained from the base of the skull through the vertex without contrast.  Comparison: None.  Findings: No skull fracture or intracranial hemorrhage.  No hydrocephalus.  No CT evidence of large acute infarct.  No intracranial mass lesion detected on this unenhanced exam.  Mastoid air cells, middle ear cavities and  visualized sinuses are clear.  IMPRESSION: No acute abnormality.  Please see above.   Original Report Authenticated By: Lacy Duverney, M.D.    Dg Chest Port 1 View  09/05/2012  *RADIOLOGY REPORT*  Clinical Data: Altered mental status.  PORTABLE CHEST - 1 VIEW  Comparison: 06/06/2012.  Findings:  Cardiopericardial silhouette within normal limits. Mediastinal contours normal. Trachea midline.  No airspace disease or effusion.  Prominent vessel is present in the right midlung. Monitoring leads are projected over the chest.  IMPRESSION: No active cardiopulmonary disease.   Original Report Authenticated By: Andreas Newport, M.D.     Positive for bad mood, depression, illegal drug usage and Decrease psychomotor activity and ADLs Blood pressure 102/67, pulse 64, temperature 97.8 F (36.6 C), temperature source Oral, resp. rate 18, SpO2 97.00%.   Assessment/Plan: Maj. 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 Patient was positively responded to Ativan IV given during the emergency department and recommended same medication 3 times a day along with his antidepressant medication Lexapro 10 mg a day. Recommended staff to push the fluids and soft diet.  Zarria Towell,JANARDHAHA R. 09/07/2012, 4:35 PM

## 2012-09-07 NOTE — ED Notes (Signed)
Pt  Eating and drinking and tolerating well alert and oriented

## 2012-09-07 NOTE — [facility] Counselor (Signed)
Per [facility], Pt accepted for treatment, attending--Dr. Daleen Bo, #[phone].

## 2012-09-07 NOTE — ED Notes (Signed)
Pt up walking to bathroom void refused urine at this time

## 2012-09-08 ENCOUNTER — Encounter (HOSPITAL_COMMUNITY): Payer: Self-pay | Admitting: Behavioral Health

## 2012-09-08 DIAGNOSIS — F1911 Other psychoactive substance abuse, in remission: Secondary | ICD-10-CM | POA: Diagnosis present

## 2012-09-08 DIAGNOSIS — F333 Major depressive disorder, recurrent, severe with psychotic symptoms: Secondary | ICD-10-CM | POA: Diagnosis present

## 2012-09-08 LAB — TSH: TSH: 2.908 u[IU]/mL (ref 0.350–4.500)

## 2012-09-08 MED ORDER — MAGNESIUM HYDROXIDE 400 MG/5ML PO SUSP: 30.0000 mL | Freq: Every day | ORAL | Status: DC | PRN

## 2012-09-08 MED ORDER — TRAZODONE HCL 50 MG PO TABS
50.0000 mg | ORAL_TABLET | Freq: Every evening | ORAL | Status: DC | PRN
  Filled 2012-09-08 (×12): qty 1

## 2012-09-08 MED ORDER — HALOPERIDOL 1 MG PO TABS
1.0000 mg | ORAL_TABLET | Freq: Two times a day (BID) | ORAL | Status: DC
  Administered 2012-09-08 – 2012-09-13 (×9): 1 mg via ORAL
  Filled 2012-09-08 (×12): qty 1

## 2012-09-08 MED ORDER — ENSURE COMPLETE PO LIQD
237.0000 mL | Freq: Every day | ORAL | Status: DC
  Administered 2012-09-08: 237 mL via ORAL

## 2012-09-08 MED ORDER — CITALOPRAM HYDROBROMIDE 20 MG PO TABS
20.0000 mg | ORAL_TABLET | Freq: Every day | ORAL | Status: DC
  Administered 2012-09-08 – 2012-09-14 (×7): 20 mg via ORAL
  Filled 2012-09-08 (×16): qty 1

## 2012-09-08 MED ORDER — ACETAMINOPHEN 325 MG PO TABS: 650.0000 mg | ORAL_TABLET | Freq: Four times a day (QID) | ORAL | Status: DC | PRN

## 2012-09-08 MED ORDER — BENZTROPINE MESYLATE 0.5 MG PO TABS
0.5000 mg | ORAL_TABLET | Freq: Two times a day (BID) | ORAL | Status: DC
  Administered 2012-09-08 – 2012-09-13 (×9): 0.5 mg via ORAL
  Filled 2012-09-08 (×12): qty 1

## 2012-09-08 MED ORDER — ALUM & MAG HYDROXIDE-SIMETH 200-200-20 MG/5ML PO SUSP: 30.0000 mL | ORAL | Status: DC | PRN

## 2012-09-08 MED ORDER — ADULT MULTIVITAMIN W/MINERALS CH
1.0000 | ORAL_TABLET | Freq: Every day | ORAL | Status: DC
  Administered 2012-09-08 – 2012-09-14 (×7): 1 via ORAL
  Filled 2012-09-08 (×23): qty 1

## 2012-09-08 MED ORDER — NICOTINE 14 MG/24HR TD PT24
14.0000 mg | MEDICATED_PATCH | Freq: Every day | TRANSDERMAL | Status: DC
  Filled 2012-09-08 (×3): qty 1

## 2012-09-08 MED ORDER — LORAZEPAM 1 MG PO TABS: 2.0000 mg | ORAL_TABLET | Freq: Four times a day (QID) | ORAL | Status: DC | PRN

## 2012-09-08 NOTE — Progress Notes (Signed)
The focus of this group is to help patients review their daily goal of treatment and discuss progress on daily workbooks. Pt attended the evening group but left group twice, missing most of the session, and did not speak while in group. Pt smiled during group, but hurriedly left both times upon exit.

## 2012-09-08 NOTE — Progress Notes (Signed)
32 y/o male who presents involuntarily for psychosis.  Patient states he had been fasting from food for 29 days and fasting from water for 10 days.  Patient is tangential and paranoid.  Patient is hard to follow.  Patient changes the subject frequently.  Patient states he is dealing with some legal issues but cannot articulate exactly what the legal issues are.  Patient did mention it had to do with his children.  Patient denies SI/HI and denies AVH.  POC explained, consents signed and fall safety plan reviewed, patient verbalized understanding.  Patient medical history HTN.  Patient had shoulder sx.  Foods and fluids offered and patient accepted  both.  Patient oriented and escorted to the unit by [facility] MHT.
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
## 2012-09-08 NOTE — Progress Notes (Signed)
  D) Patient pleasant and cooperative upon my assessment. Patient completed Patient Self Inventory, reports slept "fair," and  appetite is "poor." Patient rates hopeless feelings as  "always have hope"/10. Patient denies SI/HI, denies A/V hallucinations.   A) Patient offered support and encouragement, patient encouraged to discuss feelings/concerns with staff. Patient verbalized understanding. Patient monitored Q15 minutes for safety. Patient met with MD and treatment team to discuss today's goals and plan of care. Patient states "I stopped using my cell phone months ago because I know they are tracking me with it." Patient verbalizes "I don't want to talk about my magical powers."   R) Patient visible in milieu, attending groups in day room and meals in dining room. Patient appropriate with staff and peers.   Patient taking medications as ordered. Will continue to monitor.

## 2012-09-08 NOTE — Progress Notes (Signed)
[facility] Group Notes:  (Counselor/Nursing/MHT/Case Management/Adjunct)  06/18/2012 12:00 PM  Type of Therapy:  Group Therapy  Participation Level:  Active  Participation Quality:  Attentive  Affect:  Depressed  Cognitive:  Appropriate  Insight:  Improving  Engagement in Group:  Improving  Engagement in Therapy:  Improving  Modes of Intervention:  Discussion, Socialization and Support  Summary of Progress/Problems: MHA: Pt presented with depressed affect throughout group but remained engaged and interested in subject matter. Pt listened intently while speaker played guitar and thanked speaker at the end of group.   Damarys Speir N 06/18/2012, 12:00 PM

## 2012-09-08 NOTE — Treatment Plan (Signed)
Interdisciplinary Treatment Plan Update (Adult)  Date: 09/08/2012  Time Reviewed: 10:44 AM   Progress in Treatment: Attending groups: Yes Participating in groups: Yes Taking medication as prescribed: No: Yes Tolerating medication: Yes   Family/Significant other contact made:  No Patient understands diagnosis:  Yes As evidenced by asking for help with mood stabilization, paranoia Discussing patient identified problems/goals with staff:  Yes See below Medical problems stabilized or resolved:  Yes Denies suicidal/homicidal ideation: Yes  In tx team Issues/concerns per patient self-inventory:  Not filled out Other:  New problem(s) identified: N/A  Reason for Continuation of Hospitalization: Delusions  Depression Medication stabilization  Interventions implemented related to continuation of hospitalization:  Celexa, Haldol trial  Encourage group attendance and participation  Additional comments:  Estimated length of stay: 4-5 days  Discharge Plan: see below  New goal(s): N/A  Review of initial/current patient goals per problem list:   1.  Goal(s): Stabilize mood with the use of medication/therapeutic milieu  Met:  No  Target date:2/10  As evidenced ZO:[id number] currently presents as depressed.  He looks despondent, presents in a grim manner and endorses depression.  By d/c he will rate his depression at a 4 or less on a 10 scale.  2.  Goal (s): Eliminate psychosis with the use of medication/therapeutic milieu  Met:  No  Target date:2/10  As evidenced VW:[id number] is paranoid and delusional.  He believes someone is tracking him on his cell phone, and he quit driving because he believes he was being followed.  By d/c he will no longer be paranoid nor have these delusions.  3.  Goal(s): Identify dispositional plan, outpt provider  Met:  No  Target date:2/10  As evidenced JY:NWGNFA will figure out where he will stay at d/c, thus dictating where he will follow up for outpt  services  4.  Goal(s):  Met:  No  Target date:  As evidenced by:  Attendees: Patient: Benjamin [name]  09/08/2012 10:44AM  Family:     Physician:  Thedore Mins 09/08/2012 10:44 AM   Nursing:  Berneice Heinrich  09/08/2012 10:44 AM   Clinical Social Worker:  Richelle Ito 09/08/2012 10:44 AM   Extender:   09/08/2012 10:44 AM   Other:     Other:     Other:     Other:      Scribe for Treatment Team:   Ida Rogue, 09/08/2012 10:44 AM
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
## 2012-09-08 NOTE — Progress Notes (Signed)
Nutrition Brief Note  Pt meets criteria for severe MALNUTRITION in the context of social/environmental circumstances as evidenced by <50% estimated energy intake with 32.7% weight loss in the past month.  Intervention: - Will order daily multivitamin - Ensure Complete HS - Recommend MD monitor electrolytes for refeeding syndrome and replace accordingly (potassium, magnesium, and phosphorus) - potassium on 09/05/12 WNL - No further nutrition intervention indicated at this time  Patient identified on the Malnutrition Screening Tool (MST) Report  Body mass index is 23.09 kg/(m^2). Patient meets criteria for normal weight based on current BMI.   Pt reports prior to admission he was on a 30 day fast however he was only able to complete 29 days of it because he got distracted. Pt reports he was doing the fast "for a lot of reasons, I don't want to talk about it". Pt reports going for 29 days without eating and 10 days without drinking anything. Pt reports 50 pound weight loss during this time frame. Pt reports he has not fasted before. Noted pt with several empty Nutrigrain bar wrappers at bedside. Encouraged pt to gradually increase intake slowly.  Levon Hedger MS, RD, LDN [phone] Pager [phone] After Hours Pager
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
## 2012-09-09 DIAGNOSIS — F29 Unspecified psychosis not due to a substance or known physiological condition: Secondary | ICD-10-CM

## 2012-09-09 DIAGNOSIS — F22 Delusional disorders: Secondary | ICD-10-CM | POA: Diagnosis present

## 2012-09-09 MED ORDER — LORAZEPAM 2 MG/ML IJ SOLN
INTRAMUSCULAR | Status: AC
  Administered 2012-09-09: 2 mg
  Filled 2012-09-09: qty 2

## 2012-09-09 MED ORDER — LORAZEPAM 2 MG/ML IJ SOLN: 2.0000 mg | Freq: Four times a day (QID) | INTRAMUSCULAR | Status: DC | PRN

## 2012-09-09 MED ORDER — HYDROXYZINE HCL 25 MG PO TABS: 25.0000 mg | ORAL_TABLET | Freq: Four times a day (QID) | ORAL | Status: AC | PRN

## 2012-09-09 MED ORDER — DICYCLOMINE HCL 20 MG PO TABS: 20.0000 mg | ORAL_TABLET | Freq: Four times a day (QID) | ORAL | Status: AC | PRN

## 2012-09-09 MED ORDER — NAPROXEN 500 MG PO TABS: 500.0000 mg | ORAL_TABLET | Freq: Two times a day (BID) | ORAL | Status: AC | PRN

## 2012-09-09 MED ORDER — ONDANSETRON 4 MG PO TBDP: 4.0000 mg | ORAL_TABLET | Freq: Four times a day (QID) | ORAL | Status: AC | PRN

## 2012-09-09 MED ORDER — CLONIDINE HCL 0.1 MG PO TABS
0.1000 mg | ORAL_TABLET | Freq: Four times a day (QID) | ORAL | Status: DC
  Filled 2012-09-09 (×3): qty 1

## 2012-09-09 MED ORDER — METHOCARBAMOL 500 MG PO TABS: 500.0000 mg | ORAL_TABLET | Freq: Three times a day (TID) | ORAL | Status: AC | PRN

## 2012-09-09 MED ORDER — CLONIDINE HCL 0.1 MG PO TABS: 0.1000 mg | ORAL_TABLET | Freq: Every day | ORAL | Status: DC

## 2012-09-09 MED ORDER — CLONIDINE HCL 0.1 MG PO TABS: 0.1000 mg | ORAL_TABLET | ORAL | Status: DC

## 2012-09-09 MED ORDER — LOPERAMIDE HCL 2 MG PO CAPS: 2.0000 mg | ORAL_CAPSULE | ORAL | Status: AC | PRN

## 2012-09-09 MED ORDER — CLONIDINE HCL 0.1 MG PO TABS
0.1000 mg | ORAL_TABLET | Freq: Four times a day (QID) | ORAL | Status: DC
  Administered 2012-09-09: 0.1 mg via ORAL
  Filled 2012-09-09 (×8): qty 1

## 2012-09-09 NOTE — Progress Notes (Signed)
Psychoeducational Group Note  Date:  09/09/2012 Time:  0930  Group Topic/Focus:  Rediscovering Joy:   The focus of this group is to explore various ways to relieve stress in a positive manner.  Participation Level: Did Not Attend  Participation Quality:  Not Applicable  Affect:  Not Applicable  Cognitive:  Not Applicable  Insight:  Not Applicable  Engagement in Group: Not Applicable  Additional Comments:  Pt was asleep and could not attend group this morning.  Roseline Ebarb E 09/09/2012, 2:00 PM

## 2012-09-09 NOTE — Discharge Planning (Signed)
[facility] Group Notes:  (Counselor/Nursing/MHT/Case Management/Adjunct)  09/09/2012 2:35 PM   Type of Therapy:  Group Therapy  Participation Level:  Active  Participation Quality:  Attentive and Resistant  Affect:  Blunted  Cognitive:  Appropriate  Insight:  Improving  Engagement in Group:  Improving  Engagement in Therapy:  Improving  Modes of Intervention:  Activity, Discussion, Socialization and Support  Summary of Progress/Problems: Topic-Balance: The topic for group was balance in life. Pt listened intently to others as they spoke about what balance means to them, ways they fall out of balance, and ways to achieve a sense of balance. Pt participated in the ungame where he talked about a childhood memory about bedtime. He talked about how he would recite his prayers each night and the group processed ways to relate to his childhood bedtime experience.    Benjamin [name], Benjamin [name] 06/17/2012, 2:34 PM Lillianah Swartzentruber Nail, LCSW reviewed note.

## 2012-09-09 NOTE — Progress Notes (Signed)
Patient ID: Benjamin [name], male   DOB: 1980/10/06, 32 y.o.   MRN: [id number]  D:  Pt endorses AVH. Pt denies SI/HI. Pt is pleasant and cooperative.Pt  movement slow and catatonic like, but appears to be controlled by pt. Pt denies smoking anything before coming into ED, pt refused clonidine.    A: Pt was offered support and encouragement. Pt was not given scheduled medications, pt refused, pt states he does not need the medication. Pt was encourage to attend groups. Q 15 minute checks were done for safety.   R:Pt attends groups. Pt is not  taking medication. safety maintained on unit.

## 2012-09-09 NOTE — Progress Notes (Signed)
Patient ID: Benjamin [name], male   DOB: Aug 28, 1980, 32 y.o.   MRN: [id number]   D: Pt was pleasant, but affect was flat. Pt described his day as "off and on". However when the writer asked him to describe, pt stated, "I would rather not get into it". Pt did state he's been going to groups.  A: Support and encouragement was offered. 15 min checks continued for safety.  R: Pt remains safe.

## 2012-09-09 NOTE — Progress Notes (Signed)
[facility] MD Progress Note  09/09/2012 4:44 PM Benjamin [name]  MRN:  [id number] Subjective:  "I'm kinda rough"  Met with patient 1:1 to discuss his care.  He states he has been smoking large quantities of Kratom daily prior to admission.  Stopped all drugs in November. Diagnosis:  Drug induced psychosis  ADL's:  Impaired  Sleep: Poor  Appetite:  Poor  Suicidal Ideation:  denies Homicidal Ideation:  denies AEB (as evidenced by):Patient's affect, behavior and response to questions.  Psychiatric Specialty Exam: Review of Systems  Constitutional: Negative.  Negative for fever, chills, weight loss, malaise/fatigue and diaphoresis.  HENT: Negative for congestion and sore throat.   Eyes: Negative for blurred vision, double vision and photophobia.  Respiratory: Negative for cough, shortness of breath and wheezing.   Cardiovascular: Negative for chest pain, palpitations and PND.  Gastrointestinal: Negative for heartburn, nausea, vomiting, abdominal pain, diarrhea and constipation.  Musculoskeletal: Negative for myalgias, joint pain and falls.  Neurological: Negative for dizziness, tingling, tremors, sensory change, speech change, focal weakness, seizures, loss of consciousness, weakness and headaches.  Endo/Heme/Allergies: Negative for polydipsia. Does not bruise/bleed easily.  Psychiatric/Behavioral: Negative for depression, suicidal ideas, hallucinations, memory loss and substance abuse. The patient is not nervous/anxious and does not have insomnia.     Blood pressure 101/63, pulse 85, temperature 98.4 F (36.9 C), temperature source Oral, resp. rate 18, height 5' 8.25" (1.734 m), weight 69.4 kg (153 lb).Body mass index is 23.09 kg/(m^2).  General Appearance: Disheveled  Eye Solicitor::  Fair  Speech:  Clear and Coherent  Volume:  Decreased  Mood:  Anxious and Depressed  Affect:  Blunt  Thought Process:  Circumstantial  Orientation:  NA  Thought Content:  Negative  Suicidal Thoughts:  No   Homicidal Thoughts:  No  Memory:  Immediate;   Poor  Judgement:  Impaired  Insight:  Lacking  Psychomotor Activity:  Tremor  Concentration:  Poor  Recall:  Poor  Akathisia:  No  Handed:  Right  AIMS (if indicated):     Assets:  Communication Skills Desire for Improvement Housing Physical Health Social Support Talents/Skills  Sleep:  Number of Hours: 6.25    Current Medications: Current Facility-Administered Medications  Medication Dose Route Frequency Provider Last Rate Last Dose  . acetaminophen (TYLENOL) tablet 650 mg  650 mg Oral Q6H PRN Kerry Hough, PA      . alum & mag hydroxide-simeth (MAALOX/MYLANTA) 200-200-20 MG/5ML suspension 30 mL  30 mL Oral Q4H PRN Kerry Hough, PA      . benztropine (COGENTIN) tablet 0.5 mg  0.5 mg Oral BID Mojeed Akintayo   0.5 mg at 09/09/12 0727  . citalopram (CELEXA) tablet 20 mg  20 mg Oral Daily Mojeed Akintayo   20 mg at 09/09/12 0727  . feeding supplement (ENSURE COMPLETE) liquid 237 mL  237 mL Oral Q2000 Lavena Bullion, RD   237 mL at 09/08/12 2102  . haloperidol (HALDOL) tablet 1 mg  1 mg Oral BID Mojeed Akintayo   1 mg at 09/09/12 0726  . LORazepam (ATIVAN) injection 2 mg  2 mg Intramuscular Q6H PRN Mojeed Akintayo      . LORazepam (ATIVAN) tablet 2 mg  2 mg Oral Q6H PRN Mojeed Akintayo      . magnesium hydroxide (MILK OF MAGNESIA) suspension 30 mL  30 mL Oral Daily PRN Kerry Hough, PA      . multivitamin with minerals tablet 1 tablet  1 tablet Oral Daily Lavena Bullion,  RD   1 tablet at 09/09/12 0727  . traZODone (DESYREL) tablet 50 mg  50 mg Oral QHS,MR X 1 Kerry Hough, PA        Lab Results:  Results for orders placed during the hospital encounter of 09/07/12 (from the past 48 hour(s))  TSH     Status: Normal   Collection Time   09/08/12  6:22 AM      Component Value Range Comment   TSH 2.908  0.350 - 4.500 uIU/mL     Physical Findings: AIMS: Facial and Oral Movements Muscles of Facial Expression: None,  normal Lips and Perioral Area: None, normal Jaw: None, normal Tongue: None, normal,Extremity Movements Upper (arms, wrists, hands, fingers): None, normal Lower (legs, knees, ankles, toes): None, normal, Trunk Movements Neck, shoulders, hips: None, normal, Overall Severity Severity of abnormal movements (highest score from questions above): None, normal Incapacitation due to abnormal movements: None, normal Patient's awareness of abnormal movements (rate only patient's report): No Awareness, Dental Status Current problems with teeth and/or dentures?: No Does patient usually wear dentures?: No  CIWA:    COWS:     Treatment Plan Summary: Daily contact with patient to assess and evaluate symptoms and progress in treatment Medication management  Plan:  Medical Decision Making Problem Points:  Established problem, stable/improving (1), Review of last therapy session (1) and Review of psycho-social stressors (1) Data Points:  Review or order medicine tests (1) Review of medication regiment & side effects (2) 1. Continue crisis management and stabilization. 2. Medication management to reduce current symptoms to base line and improve the     patient's overall level of functioning 3. Treat health problems as indicated. 4. Develop treatment plan to decrease risk of relapse upon discharge and the need for     readmission. 5. Psycho-social education regarding relapse prevention and self care. 6. Health care follow up as needed for medical problems. 7. Restart home medications where appropriate. 8. Will start low dose Clonidine for Kratom withdrawal to help with symptoms.  I certify that inpatient services furnished can reasonably be expected to improve the patient's condition.  Rona Ravens. Liat Mayol RPAC 09/09/2012, 4:44 PM

## 2012-09-09 NOTE — Progress Notes (Signed)
[facility] LCSW Aftercare Discharge Planning Group Note  09/09/2012 9:38 AM  Participation Quality: DID NOT ATTEND    Keelie Zemanek, Ledell Peoples 09/09/2012, 9:38 AM

## 2012-09-10 MED ORDER — CLONIDINE HCL 0.1 MG PO TABS: 0.1000 mg | ORAL_TABLET | Freq: Three times a day (TID) | ORAL | Status: DC | PRN

## 2012-09-10 NOTE — Progress Notes (Signed)
  D) Patient quiet upon my assessment. Patient refusing to speak/acknowledge staff at times. Patient refused to  complete Patient Self Inventory. Patient denies SI/HI, denies A/V hallucinations.   A) Patient offered support and encouragement, patient encouraged to discuss feelings/concerns with staff. Patient verbalized understanding. Patient monitored Q15 minutes for safety. Patient met with MD  to discuss today's goals and plan of care.  R) Patient visible in milieu, attending groups in day room and meals in dining room. Patient appropriate with staff and peers.   Patient taking medications as ordered. Will continue to monitor.

## 2012-09-10 NOTE — Progress Notes (Signed)
[facility] LCSW Aftercare Discharge Planning Group Note  09/10/2012 9:25 AM  Participation Quality:  Inattentive and Resistant  Affect:  Blunted  Cognitive:  Appropriate  Insight:  Limited  Engagement in Group:  Lacking  Modes of Intervention:  Discussion, Problem-solving, Socialization and Support  Summary of Progress/Problems: Pt refused to consent when asked if SW could speak to his mother, who had left several messages. Pt unsure how his mother knew that he was at this hospital. He reported, "I'm doing," when asked how he was this morning. Pt resistant to further attempts at engaging conversation and presented with blunted affect.   Smart, Heather N 09/10/2012, 9:25 AM

## 2012-09-10 NOTE — Progress Notes (Signed)
Adult Psychoeducational Group Note  Date:  09/10/2012 Time:  11:41 AM  Group Topic/Focus:  Relapse Prevention Planning:   The focus of this group is to define relapse and discuss the need for planning to combat relapse.  Participation Level:  Did Not Attend  Participation Quality:    Affect:    Cognitive:    Insight:   Engagement in Group:    Modes of Intervention:    Additional Comments:  Pt refused to attend.  Isla Pence M 09/10/2012, 11:41 AM

## 2012-09-10 NOTE — Progress Notes (Signed)
Patient ID: [name], male   DOB: [birth year], 32 y.o.   MRN: [id number] [facility] MD Progress Note  09/10/2012 10:52 AM Benjamin [name]  MRN:  [id number]  Subjective: Patient remains depressed with low energy level, poor sleep and lack of motivation. Patient continues to report delusional thinking, he states that he does not trust anyone and does not want his information to be shared with his family. He denies psychosis today. He acknowledged he was doing a lot of drugs until November last year and did not report any withdrawal symptoms for now.  Diagnosis: MDD with psychosis                    History of polysubstance abuse.  ADL's:  Impaired  Sleep: fair  Appetite:  Poor  Suicidal Ideation:  denies Homicidal Ideation:  denies AEB (as evidenced by):Patient's affect, behavior and response to questions.  Psychiatric Specialty Exam: Review of Systems  Constitutional: Negative.  Negative for fever, chills, weight loss, malaise/fatigue and diaphoresis.  HENT: Negative for congestion and sore throat.   Eyes: Negative for blurred vision, double vision and photophobia.  Respiratory: Negative for cough, shortness of breath and wheezing.   Cardiovascular: Negative for chest pain, palpitations and PND.  Gastrointestinal: Negative for heartburn, nausea, vomiting, abdominal pain, diarrhea and constipation.  Musculoskeletal: Negative for myalgias, joint pain and falls.  Neurological: Negative for dizziness, tingling, tremors, sensory change, speech change, focal weakness, seizures, loss of consciousness, weakness and headaches.  Endo/Heme/Allergies: Negative for polydipsia. Does not bruise/bleed easily.  Psychiatric/Behavioral: Negative for depression, suicidal ideas, hallucinations, memory loss and substance abuse. The patient is not nervous/anxious and does not have insomnia.     Blood pressure 117/76, pulse 91, temperature 97.6 F (36.4 C), temperature source Oral, resp. rate 16, height 5'  8.25" (1.734 m), weight 69.4 kg (153 lb).Body mass index is 23.09 kg/(m^2).  General Appearance: Disheveled  Eye Solicitor::  Fair  Speech:  Clear and Coherent  Volume:  Decreased  Mood:  Anxious and Depressed  Affect:  Blunt  Thought Process:  Circumstantial  Orientation:  NA  Thought Content:  Negative  Suicidal Thoughts:  No  Homicidal Thoughts:  No  Memory:  Immediate;   Poor  Judgement:  Impaired  Insight:  Lacking  Psychomotor Activity:  Tremor  Concentration:  Poor  Recall:  Poor  Akathisia:  No  Handed:  Right  AIMS (if indicated):     Assets:  Communication Skills Desire for Improvement Housing Physical Health Social Support Talents/Skills  Sleep:  Number of Hours: 6    Current Medications: Current Facility-Administered Medications  Medication Dose Route Frequency Provider Last Rate Last Dose  . acetaminophen (TYLENOL) tablet 650 mg  650 mg Oral Q6H PRN Kerry Hough, PA      . alum & mag hydroxide-simeth (MAALOX/MYLANTA) 200-200-20 MG/5ML suspension 30 mL  30 mL Oral Q4H PRN Kerry Hough, PA      . benztropine (COGENTIN) tablet 0.5 mg  0.5 mg Oral BID Paden Kuras   0.5 mg at 09/10/12 0800  . citalopram (CELEXA) tablet 20 mg  20 mg Oral Daily Concepcion Gillott   20 mg at 09/10/12 0800  . cloNIDine (CATAPRES) tablet 0.1 mg  0.1 mg Oral Q8H PRN Deshon Koslowski      . dicyclomine (BENTYL) tablet 20 mg  20 mg Oral Q6H PRN Verne Spurr, PA-C      . feeding supplement (ENSURE COMPLETE) liquid 237 mL  237 mL Oral Q2000 Heather  Rutha Bouchard, RD   237 mL at 09/08/12 2102  . haloperidol (HALDOL) tablet 1 mg  1 mg Oral BID Eivin Mascio   1 mg at 09/10/12 0800  . hydrOXYzine (ATARAX/VISTARIL) tablet 25 mg  25 mg Oral Q6H PRN Verne Spurr, PA-C      . loperamide (IMODIUM) capsule 2-4 mg  2-4 mg Oral PRN Verne Spurr, PA-C      . LORazepam (ATIVAN) injection 2 mg  2 mg Intramuscular Q6H PRN Shanen Norris      . LORazepam (ATIVAN) tablet 2 mg  2 mg Oral Q6H PRN Takuya Lariccia      . magnesium hydroxide (MILK OF MAGNESIA) suspension 30 mL  30 mL Oral Daily PRN Kerry Hough, PA      . methocarbamol (ROBAXIN) tablet 500 mg  500 mg Oral Q8H PRN Verne Spurr, PA-C      . multivitamin with minerals tablet 1 tablet  1 tablet Oral Daily Lavena Bullion, RD   1 tablet at 09/10/12 0800  . naproxen (NAPROSYN) tablet 500 mg  500 mg Oral BID PRN Verne Spurr, PA-C      . ondansetron (ZOFRAN-ODT) disintegrating tablet 4 mg  4 mg Oral Q6H PRN Verne Spurr, PA-C      . traZODone (DESYREL) tablet 50 mg  50 mg Oral QHS,MR X 1 Kerry Hough, PA        Lab Results:  No results found for this or any previous visit (from the past 48 hour(s)).  Physical Findings: AIMS: Facial and Oral Movements Muscles of Facial Expression: None, normal Lips and Perioral Area: None, normal Jaw: None, normal Tongue: None, normal,Extremity Movements Upper (arms, wrists, hands, fingers): None, normal Lower (legs, knees, ankles, toes): None, normal, Trunk Movements Neck, shoulders, hips: None, normal, Overall Severity Severity of abnormal movements (highest score from questions above): None, normal Incapacitation due to abnormal movements: None, normal Patient's awareness of abnormal movements (rate only patient's report): No Awareness, Dental Status Current problems with teeth and/or dentures?: No Does patient usually wear dentures?: No  CIWA:  CIWA-Ar Total: 0  COWS:     Treatment Plan Summary: Daily contact with patient to assess and evaluate symptoms and progress in treatment Medication management  Plan:  Medical Decision Making Problem Points:  Established problem, stable/improving (1), Review of last therapy session (1) and Review of psycho-social stressors (1) Data Points:  Review or order medicine tests (1) Review of medication regiment & side effects (2) 1. Continue crisis management and stabilization. 2. Medication management to reduce current symptoms to base line  and improve the     patient's overall level of functioning 3. Treat health problems as indicated. 4. Develop treatment plan to decrease risk of relapse upon discharge and the need for     readmission. 5. Psycho-social education regarding relapse prevention and self care. 6. Health care follow up as needed for medical problems. 7. Restart home medications where appropriate.   I certify that inpatient services furnished can reasonably be expected to improve the patient's condition.  Benjamin Lamarque,MD 09/10/2012, 10:52 AM

## 2012-09-10 NOTE — Clinical Social Work Note (Signed)
[facility] LCSW Group Therapy  09/10/2012 1:49 PM   Type of Therapy:  Group Therapy  Participation Level:  Active  Participation Quality:  Attentive  Affect:  Appropriate  Cognitive:  Appropriate  Insight:  Improving  Engagement in Therapy:  Engaged  Modes of Intervention:  Clarification, Education, Exploration and Socialization  Summary of Progress/Problems: Today's group focused on relapse prevention.  We defined the term, and then brainstormed on ways to prevent relapse. Josh reluctantly came to group.  He did not partcipate spontaneously, and answered one or two questions with one word answers.  Appeared grim, disconnected.  He left at one point, returned 15 minutes later. Josh declined the opportunity to participate in game of charades but did watch as others played.   Daryel Gerald B 09/10/2012 , 1:49 PM

## 2012-09-10 NOTE — Progress Notes (Signed)
Patient ID: Benjamin [name], male   DOB: Dec 22, 1980, 32 y.o.   MRN: [id number]   D: Pt has remained sleep the majority of the shift. Pt observed sleeping in bed with eyes closed. RR even and unlabored. No distress noted  .  A: Q 15 minute checks were done for safety.  R: safety maintained on unit.

## 2012-09-10 NOTE — Progress Notes (Signed)
  D) Patient quiet and withdrawn upon my assessment. Patient states "I just don't want to talk to anybody." Patient completed Patient Self Inventory, reports slept "well," and  appetite is "poor." Patient rates depression as  1 /10, patient rates hopeless feelings as 1 /10. Patient denies SI/HI, denies A/V hallucinations.   A) Patient offered support and encouragement, patient encouraged to discuss feelings/concerns with staff. Patient verbalized understanding. Patient monitored Q15 minutes for safety. Patient met with MD  to discuss today's goals and plan of care.  R) Patient visible in milieu, attending groups in day room and meals in dining room. Patient guarded and suspicious appropriate with staff and peers.   Patient taking medications with maximum encouragement from staff.  Will continue to monitor.

## 2012-09-11 DIAGNOSIS — F323 Major depressive disorder, single episode, severe with psychotic features: Secondary | ICD-10-CM

## 2012-09-11 NOTE — Progress Notes (Signed)
Psychoeducational Group Note  Date:  09/11/2012 Time:  0945 am  Group Topic/Focus:  Identifying Needs:   The focus of this group is to help patients identify their personal needs that have been historically problematic and identify healthy behaviors to address their needs.  Participation Level:  Did Not Attend   Andrena Mews 09/11/2012,11:54 AM

## 2012-09-11 NOTE — Progress Notes (Signed)
D   Patient has isolated in his room all day   He just lays there staring up at the ceiling   He does respond to voice commands   He refused to take his medications from this rn this morning but did take them from a male rn  When asked questions he does not answer and this writer has not heard him utter a word  He has been refusing to eat A   Verbal support given   Medications administered and effectiveness monitored   Q 15 min checks   Offer snacks and other diet choices and ensure R   Pt safe at present

## 2012-09-11 NOTE — Progress Notes (Addendum)
Patient ID: Benjamin [name], male   DOB: 1980-11-01, 32 y.o.   MRN: [id number] [facility] MD Progress Note  09/11/2012 1:40 PM Benjamin [name]  MRN:  [id number]  Subjective:  Benjamin [name] was seen in his room lying in bed. He did not respond to assessment questions. He did not make eye contact. However, there were no obvious distress noted   ADL's:  Impaired  Sleep: 6.5 per documentation.  Appetite:  Poor  Suicidal Ideation:  Did not respond to the questions. N/a Homicidal Ideation: Did not respond to the questions. N/a AEB (as evidenced by):Patient's affect, behavior and mannerism.Marland Kitchen  Psychiatric Specialty Exam: Review of Systems  Constitutional: Negative.  Negative for fever, chills, weight loss, malaise/fatigue and diaphoresis.  HENT: Negative for congestion and sore throat.   Eyes: Negative for blurred vision, double vision and photophobia.  Respiratory: Negative for cough, shortness of breath and wheezing.   Cardiovascular: Negative for chest pain, palpitations and PND.  Gastrointestinal: Negative for heartburn, nausea, vomiting, abdominal pain, diarrhea and constipation.  Musculoskeletal: Negative for myalgias, joint pain and falls.  Neurological: Negative for dizziness, tingling, tremors, sensory change, speech change, focal weakness, seizures, loss of consciousness, weakness and headaches.  Endo/Heme/Allergies: Negative for polydipsia. Does not bruise/bleed easily.  Psychiatric/Behavioral: Negative for depression, suicidal ideas, hallucinations, memory loss and substance abuse. The patient is not nervous/anxious and does not have insomnia.     Blood pressure 107/70, pulse 90, temperature 97 F (36.1 C), temperature source Oral, resp. rate 20, height 5' 8.25" (1.734 m), weight 69.4 kg (153 lb).Body mass index is 23.08 kg/(m^2).  General Appearance: Disheveled  Eye Contact::  Fair  Speech:  Patient did not speak  Volume:  None  Mood:  No facial expressions.  Affect:  Restricted   Thought Process:  Incoherent  Orientation:  NA  Thought Content:  Negative  Suicidal Thoughts:  No  Homicidal Thoughts:  No  Memory:  Immediate;   Poor  Judgement:  Impaired  Insight:  Lacking  Psychomotor Activity:  Quiet, still  Concentration:  Poor  Recall:  None  Akathisia:  No  Handed:  Right  AIMS (if indicated):     Assets:  Communication Skills Desire for Improvement Housing Physical Health Social Support Talents/Skills  Sleep:  Number of Hours: 6.5   Current Medications: Current Facility-Administered Medications  Medication Dose Route Frequency Provider Last Rate Last Dose  . acetaminophen (TYLENOL) tablet 650 mg  650 mg Oral Q6H PRN Kerry Hough, PA      . alum & mag hydroxide-simeth (MAALOX/MYLANTA) 200-200-20 MG/5ML suspension 30 mL  30 mL Oral Q4H PRN Kerry Hough, PA      . benztropine (COGENTIN) tablet 0.5 mg  0.5 mg Oral BID Mojeed Akintayo   0.5 mg at 09/11/12 1015  . citalopram (CELEXA) tablet 20 mg  20 mg Oral Daily Mojeed Akintayo   20 mg at 09/11/12 1016  . cloNIDine (CATAPRES) tablet 0.1 mg  0.1 mg Oral Q8H PRN Mojeed Akintayo      . dicyclomine (BENTYL) tablet 20 mg  20 mg Oral Q6H PRN Verne Spurr, PA-C      . feeding supplement (ENSURE COMPLETE) liquid 237 mL  237 mL Oral Q2000 Lavena Bullion, RD   237 mL at 09/08/12 2102  . haloperidol (HALDOL) tablet 1 mg  1 mg Oral BID Mojeed Akintayo   1 mg at 09/11/12 1016  . hydrOXYzine (ATARAX/VISTARIL) tablet 25 mg  25 mg Oral Q6H PRN Verne Spurr, PA-C      .  loperamide (IMODIUM) capsule 2-4 mg  2-4 mg Oral PRN Verne Spurr, PA-C      . LORazepam (ATIVAN) injection 2 mg  2 mg Intramuscular Q6H PRN Mojeed Akintayo      . LORazepam (ATIVAN) tablet 2 mg  2 mg Oral Q6H PRN Mojeed Akintayo      . magnesium hydroxide (MILK OF MAGNESIA) suspension 30 mL  30 mL Oral Daily PRN Kerry Hough, PA      . methocarbamol (ROBAXIN) tablet 500 mg  500 mg Oral Q8H PRN Verne Spurr, PA-C      . multivitamin with  minerals tablet 1 tablet  1 tablet Oral Daily Lavena Bullion, RD   1 tablet at 09/11/12 1016  . naproxen (NAPROSYN) tablet 500 mg  500 mg Oral BID PRN Verne Spurr, PA-C      . ondansetron (ZOFRAN-ODT) disintegrating tablet 4 mg  4 mg Oral Q6H PRN Verne Spurr, PA-C      . traZODone (DESYREL) tablet 50 mg  50 mg Oral QHS,MR X 1 Kerry Hough, PA        Lab Results:  No results found for this or any previous visit (from the past 48 hour(s)).  Physical Findings: AIMS: Facial and Oral Movements Muscles of Facial Expression: None, normal Lips and Perioral Area: None, normal Jaw: None, normal Tongue: None, normal,Extremity Movements Upper (arms, wrists, hands, fingers): None, normal Lower (legs, knees, ankles, toes): None, normal, Trunk Movements Neck, shoulders, hips: None, normal, Overall Severity Severity of abnormal movements (highest score from questions above): None, normal Incapacitation due to abnormal movements: None, normal Patient's awareness of abnormal movements (rate only patient's report): No Awareness, Dental Status Current problems with teeth and/or dentures?: No Does patient usually wear dentures?: No  CIWA:  CIWA-Ar Total: 0 COWS:  COWS Total Score: 1  Treatment Plan Summary: Daily contact with patient to assess and evaluate symptoms and progress in treatment Medication management  Plan: Supportive approach/coping skills/stablization.. Encouraged out of room, participation in group sessions and application of coping skills when distressed. Will continue to monitor response to/adverse effects of medications in use to assure effectiveness. Continue to monitor mood, behavior and interaction with staff and other patients. Continue current plan of care.  Medical Decision Making Problem Points:  Established problem, stable/improving (1), Review of last therapy session (1) and Review of psycho-social stressors (1) Data Points:  Review or order medicine tests  (1) Review of medication regiment & side effects (2)  I certify that inpatient services furnished can reasonably be expected to improve the patient's condition.  Armandina Stammer  09/11/2012, 1:40 PM

## 2012-09-11 NOTE — Progress Notes (Signed)
Psychoeducational Group Note  Date:  09/11/2012 Time:0930am  Group Topic/Focus:  Identifying Needs:   The focus of this group is to help patients identify their personal needs that have been historically problematic and identify healthy behaviors to address their needs.  Participation Level:  Did Not Attend  Participation Quality:    Affect:   Cognitive:   Insight:  Engagement in Group:  Additional Comments:  Inventory group   [name] 09/11/2012,10:18 AM

## 2012-09-11 NOTE — Clinical Social Work Note (Signed)
[facility] Group Notes: (Clinical Social Work)   09/11/2012      Type of Therapy:  Group Therapy   Participation Level:  Did Not Attend    Ambrose Mantle, LCSW 09/11/2012, 1:01 PM

## 2012-09-12 NOTE — Progress Notes (Signed)
Patient ID: Benjamin [name], male   DOB: Dec 01, 1980, 32 y.o.   MRN: [id number] D. The patient laid in his bed all evening. No interaction in the milieu or with staff. Remains electively mute. Shook his head to respond instead of speaking. A. Encouraged to attend evening wrap up group. Offered evening snack. Attempted to administer HS medication. R. Remained in bed all evening. Did not attend group. Refused food and drinks. Refused medications.

## 2012-09-12 NOTE — Progress Notes (Signed)
Patient ID: Benjamin [name], male   DOB: [birth year], 32 y.o.   MRN: [id number] Patient ID: Benjamin [name], male   DOB: [birth year], 32 y.o.   MRN: [id number] [facility] MD Progress Note  09/12/2012 12:06 PM Maximum Reiland  MRN:  [id number]  Subjective:  Mr. Benjamin [name] was seen while sitting up on his bed in his room. He made no eye contact. He made no attempt to speak and or respond to assessment questions.   ADL's:  Impaired  Sleep: 6.75 per documentation.  Appetite:  Poor  Suicidal Ideation:  Did not respond to the questions. N/a Homicidal Ideation: Did not respond to the questions. N/a AEB (as evidenced by):Patient's affect, behavior and mannerism.Marland Kitchen  Psychiatric Specialty Exam: Review of Systems  Constitutional: Negative.  Negative for fever, chills, weight loss, malaise/fatigue and diaphoresis.  HENT: Negative for congestion and sore throat.   Eyes: Negative for blurred vision, double vision and photophobia.  Respiratory: Negative for cough, shortness of breath and wheezing.   Cardiovascular: Negative for chest pain, palpitations and PND.  Gastrointestinal: Negative for heartburn, nausea, vomiting, abdominal pain, diarrhea and constipation.  Musculoskeletal: Negative for myalgias, joint pain and falls.  Neurological: Negative for dizziness, tingling, tremors, sensory change, speech change, focal weakness, seizures, loss of consciousness, weakness and headaches.  Endo/Heme/Allergies: Negative for polydipsia. Does not bruise/bleed easily.  Psychiatric/Behavioral: Negative for depression, suicidal ideas, hallucinations, memory loss and substance abuse. The patient is not nervous/anxious and does not have insomnia.     Blood pressure 99/64, pulse 92, temperature 98.7 F (37.1 C), temperature source Oral, resp. rate 16, height 5' 8.25" (1.734 m), weight 69.4 kg (153 lb).Body mass index is 23.08 kg/(m^2).  General Appearance: Disheveled  Eye Contact::  Fair  Speech:  Patient did not speak   Volume:  None  Mood:  No facial expressions.  Affect:  Restricted  Thought Process:  Incoherent  Orientation:  NA  Thought Content:  Negative  Suicidal Thoughts:  No  Homicidal Thoughts:  No  Memory:  Immediate;   Poor  Judgement:  Impaired  Insight:  Lacking  Psychomotor Activity:  Quiet, still  Concentration:  Poor  Recall:  None  Akathisia:  No  Handed:  Right  AIMS (if indicated):     Assets:  Communication Skills Desire for Improvement Housing Physical Health Social Support Talents/Skills  Sleep:  Number of Hours: 6.75   Current Medications: Current Facility-Administered Medications  Medication Dose Route Frequency Provider Last Rate Last Dose  . acetaminophen (TYLENOL) tablet 650 mg  650 mg Oral Q6H PRN Kerry Hough, PA      . alum & mag hydroxide-simeth (MAALOX/MYLANTA) 200-200-20 MG/5ML suspension 30 mL  30 mL Oral Q4H PRN Kerry Hough, PA      . benztropine (COGENTIN) tablet 0.5 mg  0.5 mg Oral BID Mojeed Akintayo   0.5 mg at 09/12/12 0750  . citalopram (CELEXA) tablet 20 mg  20 mg Oral Daily Mojeed Akintayo   20 mg at 09/12/12 0750  . cloNIDine (CATAPRES) tablet 0.1 mg  0.1 mg Oral Q8H PRN Mojeed Akintayo      . dicyclomine (BENTYL) tablet 20 mg  20 mg Oral Q6H PRN Verne Spurr, PA-C      . feeding supplement (ENSURE COMPLETE) liquid 237 mL  237 mL Oral Q2000 Lavena Bullion, RD   237 mL at 09/08/12 2102  . haloperidol (HALDOL) tablet 1 mg  1 mg Oral BID Mojeed Akintayo   1 mg at 09/12/12 0751  . hydrOXYzine (  ATARAX/VISTARIL) tablet 25 mg  25 mg Oral Q6H PRN Verne Spurr, PA-C      . loperamide (IMODIUM) capsule 2-4 mg  2-4 mg Oral PRN Verne Spurr, PA-C      . LORazepam (ATIVAN) injection 2 mg  2 mg Intramuscular Q6H PRN Mojeed Akintayo      . LORazepam (ATIVAN) tablet 2 mg  2 mg Oral Q6H PRN Mojeed Akintayo      . magnesium hydroxide (MILK OF MAGNESIA) suspension 30 mL  30 mL Oral Daily PRN Kerry Hough, PA      . methocarbamol (ROBAXIN) tablet 500  mg  500 mg Oral Q8H PRN Verne Spurr, PA-C      . multivitamin with minerals tablet 1 tablet  1 tablet Oral Daily Lavena Bullion, RD   1 tablet at 09/12/12 0751  . naproxen (NAPROSYN) tablet 500 mg  500 mg Oral BID PRN Verne Spurr, PA-C      . ondansetron (ZOFRAN-ODT) disintegrating tablet 4 mg  4 mg Oral Q6H PRN Verne Spurr, PA-C      . traZODone (DESYREL) tablet 50 mg  50 mg Oral QHS,MR X 1 Kerry Hough, PA        Lab Results:  No results found for this or any previous visit (from the past 48 hour(s)).  Physical Findings: AIMS: Facial and Oral Movements Muscles of Facial Expression: None, normal Lips and Perioral Area: None, normal Jaw: None, normal Tongue: None, normal,Extremity Movements Upper (arms, wrists, hands, fingers): None, normal Lower (legs, knees, ankles, toes): None, normal, Trunk Movements Neck, shoulders, hips: None, normal, Overall Severity Severity of abnormal movements (highest score from questions above): None, normal Incapacitation due to abnormal movements: None, normal Patient's awareness of abnormal movements (rate only patient's report): No Awareness, Dental Status Current problems with teeth and/or dentures?: No Does patient usually wear dentures?: No  CIWA:  CIWA-Ar Total: 0 COWS:  COWS Total Score: 1  Treatment Plan Summary: Daily contact with patient to assess and evaluate symptoms and progress in treatment Medication management  Plan: Supportive approach/coping skills/stablization.. Encouraged out of room, participation in group sessions and application of coping skills when distressed. Will continue to monitor response to/adverse effects of medications in use to assure effectiveness. Continue to monitor mood, behavior and interaction with staff and other patients. Continue current plan of care.  Medical Decision Making Problem Points:  Established problem, stable/improving (1), Review of last therapy session (1) and Review of  psycho-social stressors (1) Data Points:  Review or order medicine tests (1) Review of medication regiment & side effects (2)  I certify that inpatient services furnished can reasonably be expected to improve the patient's condition.  Sanjuana Kava  09/12/2012, 12:06 PM

## 2012-09-12 NOTE — Progress Notes (Signed)
D   Pt isolates in his room  He does not interact with anyone  He does go to meals and eats   He does not talk to staff but does understand what you say to him because he was told that if he continues to refuse po medications the doctor would administered shots and he took the pills right away A   Verbal support given  Medications administered and effectiveness monitored   Q 15 min checks  R   Pt safe at present

## 2012-09-12 NOTE — Progress Notes (Signed)
[facility] Group Notes:  (Nursing/MHT/Case Management/Adjunct)  Date:  09/12/2012  Time:  2:20 PM  Type of Therapy:  MHT group   Participation Level:  Did Not Attend  Summary of Progress/Problems:  Andrena Mews 09/12/2012, 2:20 PM

## 2012-09-12 NOTE — Progress Notes (Signed)
Psychoeducational Group Note  Date:  09/12/2012 Time:  0945 am  Group Topic/Focus:  Making Healthy Choices:   The focus of this group is to help patients identify negative/unhealthy choices they were using prior to admission and identify positive/healthier coping strategies to replace them upon discharge.  Participation Level:  Did Not Attend  Andrena Mews 09/12/2012, 10:29 AM

## 2012-09-12 NOTE — Progress Notes (Signed)
Patient ID: Benjamin [name], male   DOB: May 05, 1981, 32 y.o.   MRN: [id number] Psychoeducational Group Note  Date:  09/12/2012 Time:  1000am  Group Topic/Focus:  Making Healthy Choices:   The focus of this group is to help patients identify negative/unhealthy choices they were using prior to admission and identify positive/healthier coping strategies to replace them upon discharge.  Participation Level:  None  Participation Quality:  Inattentive and Resistant  Affect:  Depressed  Cognitive:  Lacking  Insight:  None  Engagement in Group:  None  Additional Comments:  Inventory group   [name] 09/12/2012,9:54 AM

## 2012-09-12 NOTE — Clinical Social Work Note (Signed)
[facility] Group Notes: (Clinical Social Work)   09/12/2012      Type of Therapy:  Group Therapy   Participation Level:  Did Not Attend    Ambrose Mantle, LCSW 09/12/2012, 12:46 PM

## 2012-09-13 MED ORDER — HALOPERIDOL 2 MG PO TABS
2.0000 mg | ORAL_TABLET | Freq: Two times a day (BID) | ORAL | Status: DC
Start: 2012-09-13 — End: 2012-09-14
  Administered 2012-09-13 – 2012-09-14 (×2): 2 mg via ORAL
  Filled 2012-09-13 (×6): qty 1

## 2012-09-13 MED ORDER — TRAZODONE HCL 50 MG PO TABS: 50.0000 mg | ORAL_TABLET | Freq: Every evening | ORAL | Status: DC | PRN

## 2012-09-13 MED ORDER — BENZTROPINE MESYLATE 1 MG PO TABS
1.0000 mg | ORAL_TABLET | Freq: Two times a day (BID) | ORAL | Status: DC
  Administered 2012-09-13 – 2012-09-14 (×3): 1 mg via ORAL
  Filled 2012-09-13 (×30): qty 1

## 2012-09-13 NOTE — Progress Notes (Signed)
[facility] LCSW Aftercare Discharge Planning Group Note  09/13/2012 9:24 AM  Participation Quality:  Resistant  Affect:  Depressed  Cognitive:  Unknown  Did not speak  Insight:  Limited and Unknown  Did not speak  Engagement in Group:  Resistant  Modes of Intervention:  Discussion and Socialization  Summary of Progress/Problems: Declined to ask any questions.  Wearing hospital gowns-not showered or groomed.  Other patients state he has not been speaking to anyone, and that his brother visited over the weekend.  Daryel Gerald B 09/13/2012, 9:24 AM

## 2012-09-13 NOTE — Progress Notes (Signed)
Patient ID: Benjamin [name], male   DOB: 22-Nov-1980, 32 y.o.   MRN: [id number] D. The patient isolated in his room in bed all evening. No interaction in the milieu. When approached by staff, he makes eye contact and smiles, but remains mute. Answers questions by shaking his head with his lips tightly pressed together to emphasize he is not speaking.  A. Met with patient 1:1. Encouraged to join milieu in dayroom for evening group. Told he did not have to speak in group if he did not want to. Offered medication. R. Did not attend evening group. Refused both his Ensure and Trazodone. Will continue to monitor.

## 2012-09-13 NOTE — Clinical Social Work Note (Signed)
  Type of Therapy: Process Group Therapy  Participation Level:  Did Not Attend    Summary of Progress/Problems: Today's group addressed the issue of overcoming obstacles.  Patients were asked to identify their biggest obstacle post d/c that stands in the way of their on-going success, and then problem solve as to how to manage this.       Daryel Gerald B 09/13/2012   1:53 PM

## 2012-09-13 NOTE — Progress Notes (Signed)
Patient ID: Benjamin [name], male   DOB: 1981/07/08, 32 y.o.   MRN: [id number] [facility] MD Progress Note  09/13/2012 10:31 AM Benjamin [name]  MRN:  [id number]  Subjective: Patient continues to reports depressive symptoms, lack of motivation and poor energy level. He is socially withdrawn with minimal contact with peers and staffs. He reports that he does not trust anyone, he has been avoiding some food offered to him for fear of being "poisoned". He agreed to eat a lot of fruits and vegetables.  ADL's:  Impaired  Sleep: 6.75 per documentation.  Appetite:  Poor  Suicidal Ideation:  Did not respond to the questions. N/a Homicidal Ideation: Did not respond to the questions. N/a AEB (as evidenced by):Patient's affect, behavior and mannerism.Marland Kitchen  Psychiatric Specialty Exam: Review of Systems  Constitutional: Negative.  Negative for fever, chills, weight loss, malaise/fatigue and diaphoresis.  HENT: Negative for congestion and sore throat.   Eyes: Negative for blurred vision, double vision and photophobia.  Respiratory: Negative for cough, shortness of breath and wheezing.   Cardiovascular: Negative for chest pain, palpitations and PND.  Gastrointestinal: Negative for heartburn, nausea, vomiting, abdominal pain, diarrhea and constipation.  Musculoskeletal: Negative for myalgias, joint pain and falls.  Neurological: Negative for dizziness, tingling, tremors, sensory change, speech change, focal weakness, seizures, loss of consciousness, weakness and headaches.  Endo/Heme/Allergies: Negative for polydipsia. Does not bruise/bleed easily.  Psychiatric/Behavioral: Positive for depression. Negative for suicidal ideas, hallucinations, memory loss and substance abuse. The patient is nervous/anxious. The patient does not have insomnia.     Blood pressure 101/65, pulse 96, temperature 98.2 F (36.8 C), temperature source Oral, resp. rate 17, height 5' 8.25" (1.734 m), weight 69.4 kg (153 lb).Body mass index  is 23.08 kg/(m^2).  General Appearance: poor grooming  Eye Contact::  Fair  Speech:  minimal  Volume:  decreased  Mood:  depressed  Affect:  Restricted  Thought Process:  Incoherent  Orientation:  NA  Thought Content:  Negative  Suicidal Thoughts:  No  Homicidal Thoughts:  No  Memory:  Immediate;   Poor  Judgement:  Impaired  Insight:  Lacking  Psychomotor Activity:  Quiet, still  Concentration:  Poor  Recall:  None  Akathisia:  No  Handed:  Right  AIMS (if indicated):     Assets:  Communication Skills Desire for Improvement Housing Physical Health Social Support Talents/Skills  Sleep:  Number of Hours: 6.75   Current Medications: Current Facility-Administered Medications  Medication Dose Route Frequency Provider Last Rate Last Dose  . acetaminophen (TYLENOL) tablet 650 mg  650 mg Oral Q6H PRN Kerry Hough, PA      . alum & mag hydroxide-simeth (MAALOX/MYLANTA) 200-200-20 MG/5ML suspension 30 mL  30 mL Oral Q4H PRN Kerry Hough, PA      . benztropine (COGENTIN) tablet 1 mg  1 mg Oral BID Sacramento Monds      . citalopram (CELEXA) tablet 20 mg  20 mg Oral Daily Cassidie Veiga   20 mg at 09/13/12 0814  . cloNIDine (CATAPRES) tablet 0.1 mg  0.1 mg Oral Q8H PRN Esaw Knippel      . dicyclomine (BENTYL) tablet 20 mg  20 mg Oral Q6H PRN Verne Spurr, PA-C      . feeding supplement (ENSURE COMPLETE) liquid 237 mL  237 mL Oral Q2000 Lavena Bullion, RD   237 mL at 09/08/12 2102  . haloperidol (HALDOL) tablet 2 mg  2 mg Oral BID Jordyn Doane      .  hydrOXYzine (ATARAX/VISTARIL) tablet 25 mg  25 mg Oral Q6H PRN Verne Spurr, PA-C      . loperamide (IMODIUM) capsule 2-4 mg  2-4 mg Oral PRN Verne Spurr, PA-C      . LORazepam (ATIVAN) injection 2 mg  2 mg Intramuscular Q6H PRN Jeray Shugart      . LORazepam (ATIVAN) tablet 2 mg  2 mg Oral Q6H PRN Samik Balkcom      . magnesium hydroxide (MILK OF MAGNESIA) suspension 30 mL  30 mL Oral Daily PRN Kerry Hough, PA       . methocarbamol (ROBAXIN) tablet 500 mg  500 mg Oral Q8H PRN Verne Spurr, PA-C      . multivitamin with minerals tablet 1 tablet  1 tablet Oral Daily Lavena Bullion, RD   1 tablet at 09/13/12 1610  . naproxen (NAPROSYN) tablet 500 mg  500 mg Oral BID PRN Verne Spurr, PA-C      . ondansetron (ZOFRAN-ODT) disintegrating tablet 4 mg  4 mg Oral Q6H PRN Verne Spurr, PA-C      . traZODone (DESYREL) tablet 50 mg  50 mg Oral QHS PRN Phiona Ramnauth        Lab Results:  No results found for this or any previous visit (from the past 48 hour(s)).  Physical Findings: AIMS: Facial and Oral Movements Muscles of Facial Expression: None, normal Lips and Perioral Area: None, normal Jaw: None, normal Tongue: None, normal,Extremity Movements Upper (arms, wrists, hands, fingers): None, normal Lower (legs, knees, ankles, toes): None, normal, Trunk Movements Neck, shoulders, hips: None, normal, Overall Severity Severity of abnormal movements (highest score from questions above): None, normal Incapacitation due to abnormal movements: None, normal Patient's awareness of abnormal movements (rate only patient's report): No Awareness, Dental Status Current problems with teeth and/or dentures?: No Does patient usually wear dentures?: No  CIWA:  CIWA-Ar Total: 0 COWS:  COWS Total Score: 1  Treatment Plan Summary: Daily contact with patient to assess and evaluate symptoms and progress in treatment Medication management  Plan: Supportive approach/coping skills/stablization.. Encouraged out of room, participation in group sessions and application of coping skills when distressed. Will continue to monitor response to/adverse effects of medications in use to assure effectiveness. Continue to monitor mood, behavior and interaction with staff and other patients. Increase Haldol to 2mg  po BID for delusions.  Medical Decision Making Problem Points:  Established problem, stable/improving (1), Review of last  therapy session (1) and Review of psycho-social stressors (1) Data Points:  Review or order medicine tests (1) Review of medication regiment & side effects (2)  I certify that inpatient services furnished can reasonably be expected to improve the patient's condition.  Thedore Mins, MD 09/13/2012, 10:31 AM

## 2012-09-13 NOTE — Progress Notes (Signed)
Recreation Therapy Notes  09/13/2012         Time: 9:30am       Group Topic/Focus: Exercise Education/Wellness   Participation Level: Did not attend    Troyce Febo L Katlyn Muldrew, LRT/CTRS Chenelle Benning L 09/13/2012 12:04 PM 

## 2012-09-13 NOTE — Progress Notes (Signed)
Adult Psychoeducational Group Note  Date:  09/13/2012 Time:  2005  Group Topic/Focus:  Wrap-Up Group:   The focus of this group is to help patients review their daily goal of treatment and discuss progress on daily workbooks.  Participation Level:  None  Participation Quality:  None  Affect:  Flat  Cognitive:  None  Insight: None  Engagement in Group:  None  Modes of Intervention:  Discussion and Support  Additional Comments:  Pt. Did not participate in group discussion   Gwenevere Ghazi Patience 09/13/2012, 11:02 PM

## 2012-09-13 NOTE — Treatment Plan (Signed)
Interdisciplinary Treatment Plan Update (Adult)  Date: 09/13/2012  Time Reviewed: 12:55 PM   Progress in Treatment: Attending groups: Yes Participating in groups: No Taking medication as prescribed: No: Yes Tolerating medication: Yes   Family/Significant other contact made:  Yes  Mother and brother Patient understands diagnosis:  Yes As evidenced by asking for help with mood stabilization, paranoia Discussing patient identified problems/goals with staff:  Yes See below Medical problems stabilized or resolved:  Yes Denies suicidal/homicidal ideation: Yes  In tx team Issues/concerns per patient self-inventory:  Not filled out Other:  New problem(s) identified: N/A  Reason for Continuation of Hospitalization: Delusions  Depression Medication stabilization  Interventions implemented related to continuation of hospitalization:  Celexa, Double Haldol today Encourage group attendance and participation  Additional comments:  Estimated length of stay: 4-5 days  Discharge Plan: see below  New goal(s): N/A  Review of initial/current patient goals per problem list:   1.  Goal(s): Stabilize mood with the use of medication/therapeutic milieu  Met:  No  Target date:2/14  As evidenced IO:NGEXBM currently presents as depressed.  He looks despondent, presents in a grim manner and endorses depression.  By d/c he will rate his depression at a 4 or less on a 10 scale.  2.  Goal (s): Eliminate psychosis with the use of medication/therapeutic milieu  Met:  No  Target date:2/14 As evidenced WU:XLKGMW is paranoid and delusional.   He believes that staff are conspiring against him, so will only talk to the Dr.  He also is avoiding certain foods because he believes they are poison. 3.  Goal(s): Identify dispositional plan, outpt provider  Met:  No  Target date:2/14  As evidenced NU:[id number] will figure out where he will stay at d/c, thus dictating where he will follow up for outpt  services  4.  Goal(s):  Met:  No  Target date:  As evidenced by:  Attendees: Patient: Benjamin [name]  09/13/2012 10:44AM  Family:     Physician:  Thedore Mins 09/13/2012 12:55 PM   Nursing:  Liborio Nixon  09/13/2012 12:55 PM   Clinical Social Worker:  Richelle Ito 09/13/2012 12:55 PM   Extender:   09/13/2012 12:55 PM   Other:     Other:     Other:     Other:      Scribe for Treatment Team:   Ida Rogue, 09/13/2012 12:55 PM

## 2012-09-13 NOTE — Progress Notes (Signed)
D- Patient continues to exhibit paranoid behavior and is electively mute.  Unwilling to participate in group therapy.  Compliant with scheduled medications and no prn's requested.  Going to cafeteria with peers.  A- Guarded and paranoid. Adequate po intake.  Continue 15' checks for safety.

## 2012-09-14 MED ORDER — HALOPERIDOL 2 MG PO TABS
3.0000 mg | ORAL_TABLET | Freq: Every day | ORAL | Status: DC
  Filled 2012-09-14 (×2): qty 1

## 2012-09-14 MED ORDER — HALOPERIDOL 2 MG PO TABS
2.0000 mg | ORAL_TABLET | ORAL | Status: DC
  Filled 2012-09-14 (×2): qty 1

## 2012-09-14 NOTE — Clinical Social Work Note (Signed)
[facility] LCSW Group Therapy  09/14/2012 , 1:17 PM   Type of Therapy:  Group Therapy  Participation Level:  Did not attend    Summary of Progress/Problems: Today's group focused on the term Diagnosis.  Participants were asked to define the term, and then pronounce whether it is a negative, positive or neutral term.  Benjamin [name] B 09/14/2012 , 1:17 PM

## 2012-09-14 NOTE — Progress Notes (Signed)
Patient ID: Benjamin [name], male   DOB: August 15, 1980, 32 y.o.   MRN: [id number] Patient has not responded to staff today; he elects to be mute.  He sat in with nurse and PA for a meeting and did not respond to any questions.  He got up and walked out electing not to participate in meeting.  He has been compliant with his medication.  He does not attend group; does not participate in his care.  Cannot assess SI/HI/AVH at this time.

## 2012-09-14 NOTE — Progress Notes (Signed)
Patient ID: Benjamin [name], male   DOB: Feb 14, 1981, 32 y.o.   MRN: [id number] [facility] MD Progress Note  09/14/2012 1:50 PM Benjamin [name]  MRN:  [id number]  Subjective: Woke Benjamin [name] to speak with him 1:1 today. He came to the conference room without difficulty, but made poor eye contact, would not acknowledge this provider, and did not speak. After 5 minutes or so he left the room. ADL's:  Impaired disheveled  Sleep: 6.75 per documentation.  Appetite:  Poor  Suicidal Ideation:  Did not respond to the questions. N/a Homicidal Ideation: Did not respond to the questions. N/a AEB (as evidenced by):Patient's affect, behavior and mannerism.Marland Kitchen  Psychiatric Specialty Exam: Review of Systems  Constitutional: Negative.  Negative for fever, chills, weight loss, malaise/fatigue and diaphoresis.  HENT: Negative for congestion and sore throat.   Eyes: Negative for blurred vision, double vision and photophobia.  Respiratory: Negative for cough, shortness of breath and wheezing.   Cardiovascular: Negative for chest pain, palpitations and PND.  Gastrointestinal: Negative for heartburn, nausea, vomiting, abdominal pain, diarrhea and constipation.  Musculoskeletal: Negative for myalgias, joint pain and falls.  Neurological: Negative for dizziness, tingling, tremors, sensory change, speech change, focal weakness, seizures, loss of consciousness, weakness and headaches.  Endo/Heme/Allergies: Negative for polydipsia. Does not bruise/bleed easily.  Psychiatric/Behavioral: Positive for depression. Negative for suicidal ideas, hallucinations, memory loss and substance abuse. The patient is nervous/anxious. The patient does not have insomnia.     Blood pressure 98/67, pulse 101, temperature 98.7 F (37.1 C), temperature source Oral, resp. rate 16, height 5' 8.25" (1.734 m), weight 69.4 kg (153 lb).Body mass index is 23.08 kg/(m^2).  General Appearance: poor grooming  Eye Contact:: minimal  Speech:  minimal   Volume:  decreased  Mood:  depressed  Affect:  Restricted  Thought Process:  Incoherent  Orientation:  NA  Thought Content:  Negative  Suicidal Thoughts:  No  Homicidal Thoughts:  No  Memory:  Immediate;   Poor  Judgement:  Impaired  Insight:  Lacking  Psychomotor Activity:  Quiet, still  Concentration:  Poor  Recall:  None  Akathisia:  No  Handed:  Right  AIMS (if indicated):     Assets:  Communication Skills Desire for Improvement Housing Physical Health Social Support Talents/Skills  Sleep:  Number of Hours: 6.75   Current Medications: Current Facility-Administered Medications  Medication Dose Route Frequency Provider Last Rate Last Dose  . acetaminophen (TYLENOL) tablet 650 mg  650 mg Oral Q6H PRN Kerry Hough, PA      . alum & mag hydroxide-simeth (MAALOX/MYLANTA) 200-200-20 MG/5ML suspension 30 mL  30 mL Oral Q4H PRN Kerry Hough, PA      . benztropine (COGENTIN) tablet 1 mg  1 mg Oral BID Mojeed Akintayo   1 mg at 09/14/12 0834  . citalopram (CELEXA) tablet 20 mg  20 mg Oral Daily Mojeed Akintayo   20 mg at 09/14/12 0834  . cloNIDine (CATAPRES) tablet 0.1 mg  0.1 mg Oral Q8H PRN Mojeed Akintayo      . dicyclomine (BENTYL) tablet 20 mg  20 mg Oral Q6H PRN Verne Spurr, PA-C      . feeding supplement (ENSURE COMPLETE) liquid 237 mL  237 mL Oral Q2000 Lavena Bullion, RD   237 mL at 09/08/12 2102  . haloperidol (HALDOL) tablet 2 mg  2 mg Oral BID Mojeed Akintayo   2 mg at 09/14/12 0834  . hydrOXYzine (ATARAX/VISTARIL) tablet 25 mg  25 mg Oral Q6H PRN Lloyd Huger  Tahmir Kleckner, PA-C      . loperamide (IMODIUM) capsule 2-4 mg  2-4 mg Oral PRN Verne Spurr, PA-C      . LORazepam (ATIVAN) injection 2 mg  2 mg Intramuscular Q6H PRN Mojeed Akintayo      . LORazepam (ATIVAN) tablet 2 mg  2 mg Oral Q6H PRN Mojeed Akintayo      . magnesium hydroxide (MILK OF MAGNESIA) suspension 30 mL  30 mL Oral Daily PRN Kerry Hough, PA      . methocarbamol (ROBAXIN) tablet 500 mg  500 mg Oral  Q8H PRN Verne Spurr, PA-C      . multivitamin with minerals tablet 1 tablet  1 tablet Oral Daily Lavena Bullion, RD   1 tablet at 09/14/12 1610  . naproxen (NAPROSYN) tablet 500 mg  500 mg Oral BID PRN Verne Spurr, PA-C      . ondansetron (ZOFRAN-ODT) disintegrating tablet 4 mg  4 mg Oral Q6H PRN Verne Spurr, PA-C      . traZODone (DESYREL) tablet 50 mg  50 mg Oral QHS PRN Mojeed Akintayo        Lab Results:  No results found for this or any previous visit (from the past 48 hour(s)).  Physical Findings: AIMS: Facial and Oral Movements Muscles of Facial Expression: None, normal Lips and Perioral Area: None, normal Jaw: None, normal Tongue: None, normal,Extremity Movements Upper (arms, wrists, hands, fingers): None, normal Lower (legs, knees, ankles, toes): None, normal, Trunk Movements Neck, shoulders, hips: None, normal, Overall Severity Severity of abnormal movements (highest score from questions above): None, normal Incapacitation due to abnormal movements: None, normal Patient's awareness of abnormal movements (rate only patient's report): No Awareness, Dental Status Current problems with teeth and/or dentures?: No Does patient usually wear dentures?: No  CIWA:  CIWA-Ar Total: 0 COWS:  COWS Total Score: 1  Treatment Plan Summary: Daily contact with patient to assess and evaluate symptoms and progress in treatment Medication management  Plan: Supportive approach/coping skills/stablization.. Encouraged out of room, participation in group sessions and application of coping skills when distressed. Will continue to monitor response to/adverse effects of medications in use to assure effectiveness. Continue to monitor mood, behavior and interaction with staff and other patients. Increase Haldol to 2mg  po BID for delusions.  Medical Decision Making Problem Points:  Established problem, stable/improving (1), Review of last therapy session (1) and Review of psycho-social  stressors (1) Data Points:  Review or order medicine tests (1) Review of medication regiment & side effects (2)  I certify that inpatient services furnished can reasonably be expected to improve the patient's condition.   Rona Ravens. Della Homan RPAC 09/14/2012, 1:50 PM

## 2012-09-14 NOTE — Progress Notes (Signed)
Patient ID: Benjamin [name], male   DOB: 08/20/1980, 32 y.o.   MRN: [id number]  D:  Pt would not participate during the assessment. Pt lay on his back in bed with his eyes closed. Pt refused to answer questions or come up for scheduled ensure. However, MHT was able to get pt to briefly open his eyes. It was reported that pt attended evening group.  A: Encouragement and support was offered. 15 min checks continued for safety.  R: Pt remains safe.

## 2012-09-14 NOTE — Progress Notes (Signed)
Psychoeducational Group Note  Date:  09/14/2012 Time:  2000  Group Topic/Focus:  Wrap-Up Group:   The focus of this group is to help patients review their daily goal of treatment and discuss progress on daily workbooks.  Participation Level: Did Not Attend  Participation Quality:  Not Applicable  Affect:  Not Applicable  Cognitive:  Not Applicable  Insight:  Not Applicable  Engagement in Group: Not Applicable  Additional Comments:  Pt was invited to group, but did not respond when spoken to by the Writer.  Christ Kick 09/14/2012, 8:45 PM

## 2012-09-14 NOTE — Progress Notes (Signed)
[facility] LCSW Aftercare Discharge Planning Group Note  09/14/2012 1:15 PM  Participation Quality:  Resistant  Affect:  Blunted  Cognitive:  Unable to assess  Insight:  Unable to assess  Engagement in Group:  Resistant  Modes of Intervention:  Discussion, Exploration and Socialization  Summary of Progress/Problems: Crandall sat in group in silence.  He did not respond to my verbal questions, nor did he give me eye contact when I was talking to him.  He left about half way through group, and did not return.  Daryel Gerald B 09/14/2012, 1:15 PM

## 2012-09-14 NOTE — Progress Notes (Signed)
Patient ID: Unique Searfoss, male   DOB: 16-Jun-1981, 32 y.o.   MRN: [id number]   D: Initially pt lay in bed with eyes closed. Pt would not speak or look at the writer. However, after several minutes the writer re-entered the room and pt did open his eyes. However, pt still would not speak to the Clinical research associate. Writer attempted to encourage the pt to attend group.  A: Support and encouragement was offered. 15 min checks continued for safety.  R: Pt remains safe.

## 2012-09-15 DIAGNOSIS — F329 Major depressive disorder, single episode, unspecified: Secondary | ICD-10-CM

## 2012-09-15 DIAGNOSIS — F3289 Other specified depressive episodes: Secondary | ICD-10-CM

## 2012-09-15 MED ORDER — HALOPERIDOL 5 MG PO TABS
5.0000 mg | ORAL_TABLET | Freq: Two times a day (BID) | ORAL | Status: DC
  Filled 2012-09-15 (×13): qty 1

## 2012-09-15 NOTE — Progress Notes (Signed)
Psychoeducational Group Note  Date:  09/15/2012 Time: 2010  Group Topic/Focus:  Wrap-Up Group:   The focus of this group is to help patients review their daily goal of treatment and discuss progress on daily workbooks.  Participation Level: Did Not Attend  Participation Quality:  Not Applicable  Affect:  Not Applicable  Cognitive:  Not Applicable  Insight:  Not Applicable  Engagement in Group: Not Applicable  Additional Comments:    Benjamin [name] 09/15/2012, 11:31 PM

## 2012-09-15 NOTE — Progress Notes (Signed)
Recreation Therapy Notes  09/15/2012  Time: 9:30am   Group Topic/Focus: Time management   Participation Level:  Did not attend   Jearl Klinefelter, LRT/CTRS    Wyatt Thorstenson L 09/15/2012 10:08 AM

## 2012-09-15 NOTE — Progress Notes (Signed)
Pt is currently not speaking to staff. Pt avoids eye contact and will not acknowledge anyone. Pt in bed all morning. Pt is noncompliant with treatment. Pt do not appear to be in distress. Respirations are even and unlabored.   Medications offered as ordered per MD. Verbal support given. Pt encouraged to attend groups. 15 minute checks performed for safety.  Pt avoids staff. UTA pt mood. Depressed affect.

## 2012-09-15 NOTE — Progress Notes (Signed)
Patient ID: Benjamin [name], male   DOB: 1980/12/15, 32 y.o.   MRN: [id number] [facility] MD Progress Note  09/15/2012 10:41 AM Benjamin [name]  MRN:  [id number]  Subjective: Patient is non verbal today and only answer questions by shrugging his shoulders. Patient appears depressed, he is self isolated and withdrawn with minimal interaction with staffs and peer. Patient told me yesterday that he does not trust anyone including his family. He remains delusional.  ADL's:  Impaired  Sleep: 6.75 per documentation.  Appetite:  Poor  Suicidal Ideation:  Did not respond to the questions. N/a Homicidal Ideation: Did not respond to the questions. N/a AEB (as evidenced by):Patient's affect, behavior and mannerism.Marland Kitchen  Psychiatric Specialty Exam: Review of Systems  Constitutional: Negative.  Negative for fever, chills, weight loss, malaise/fatigue and diaphoresis.  HENT: Negative for congestion and sore throat.   Eyes: Negative for blurred vision, double vision and photophobia.  Respiratory: Negative for cough, shortness of breath and wheezing.   Cardiovascular: Negative for chest pain, palpitations and PND.  Gastrointestinal: Negative for heartburn, nausea, vomiting, abdominal pain, diarrhea and constipation.  Musculoskeletal: Negative for myalgias, joint pain and falls.  Neurological: Negative for dizziness, tingling, tremors, sensory change, speech change, focal weakness, seizures, loss of consciousness, weakness and headaches.  Endo/Heme/Allergies: Negative for polydipsia. Does not bruise/bleed easily.  Psychiatric/Behavioral: Positive for depression. Negative for suicidal ideas, hallucinations, memory loss and substance abuse. The patient is nervous/anxious. The patient does not have insomnia.     Blood pressure 111/64, pulse 52, temperature 98 F (36.7 C), temperature source Oral, resp. rate 16, height 5' 8.25" (1.734 m), weight 69.4 kg (153 lb).Body mass index is 23.08 kg/(m^2).  General  Appearance: poor grooming  Eye Contact::  Fair  Speech:  minimal  Volume:  decreased  Mood:  depressed  Affect:  Restricted  Thought Process:  Incoherent  Orientation:  NA  Thought Content:  Negative  Suicidal Thoughts:  No  Homicidal Thoughts:  No  Memory:  Immediate;   Poor  Judgement:  Impaired  Insight:  Lacking  Psychomotor Activity:  Quiet, still  Concentration:  Poor  Recall:  None  Akathisia:  No  Handed:  Right  AIMS (if indicated):     Assets:  Communication Skills Desire for Improvement Housing Physical Health Social Support Talents/Skills  Sleep:  Number of Hours: 6.75   Current Medications: Current Facility-Administered Medications  Medication Dose Route Frequency Provider Last Rate Last Dose  . acetaminophen (TYLENOL) tablet 650 mg  650 mg Oral Q6H PRN Kerry Hough, PA      . alum & mag hydroxide-simeth (MAALOX/MYLANTA) 200-200-20 MG/5ML suspension 30 mL  30 mL Oral Q4H PRN Kerry Hough, PA      . benztropine (COGENTIN) tablet 1 mg  1 mg Oral BID Taquana Bartley   1 mg at 09/14/12 1639  . citalopram (CELEXA) tablet 20 mg  20 mg Oral Daily Sapir Lavey   20 mg at 09/14/12 0834  . cloNIDine (CATAPRES) tablet 0.1 mg  0.1 mg Oral Q8H PRN Nhung Danko      . feeding supplement (ENSURE COMPLETE) liquid 237 mL  237 mL Oral Q2000 Lavena Bullion, RD   237 mL at 09/08/12 2102  . haloperidol (HALDOL) tablet 5 mg  5 mg Oral BID Brenan Modesto      . LORazepam (ATIVAN) injection 2 mg  2 mg Intramuscular Q6H PRN [name] Zetina      . LORazepam (ATIVAN) tablet 2 mg  2 mg Oral  Q6H PRN Luzmaria Devaux      . magnesium hydroxide (MILK OF MAGNESIA) suspension 30 mL  30 mL Oral Daily PRN Kerry Hough, PA      . multivitamin with minerals tablet 1 tablet  1 tablet Oral Daily Lavena Bullion, RD   1 tablet at 09/14/12 0834  . traZODone (DESYREL) tablet 50 mg  50 mg Oral QHS PRN Dajour Pierpoint        Lab Results:  No results found for this or any previous visit  (from the past 48 hour(s)).  Physical Findings: AIMS: Facial and Oral Movements Muscles of Facial Expression: None, normal Lips and Perioral Area: None, normal Jaw: None, normal Tongue: None, normal,Extremity Movements Upper (arms, wrists, hands, fingers): None, normal Lower (legs, knees, ankles, toes): None, normal, Trunk Movements Neck, shoulders, hips: None, normal, Overall Severity Severity of abnormal movements (highest score from questions above): None, normal Incapacitation due to abnormal movements: None, normal Patient's awareness of abnormal movements (rate only patient's report): No Awareness, Dental Status Current problems with teeth and/or dentures?: No Does patient usually wear dentures?: No  CIWA:  CIWA-Ar Total: 0 COWS:  COWS Total Score: 1  Treatment Plan Summary: Daily contact with patient to assess and evaluate symptoms and progress in treatment Medication management  Plan: Supportive approach/coping skills/stablization.. Encouraged out of room, participation in group sessions and application of coping skills when distressed. Will continue to monitor response to/adverse effects of medications in use to assure effectiveness. Continue to monitor mood, behavior and interaction with staff and other patients. Increase Haldol to 5mg  po BID for delusions.  Medical Decision Making Problem Points:  Established problem, stable/improving (1), Review of last therapy session (1) and Review of psycho-social stressors (1) Data Points:  Review or order medicine tests (1) Review of medication regiment & side effects (2)  I certify that inpatient services furnished can reasonably be expected to improve the patient's condition.  Thedore Mins, MD 09/15/2012, 10:41 AM

## 2012-09-15 NOTE — Progress Notes (Signed)
[facility] LCSW Aftercare Discharge Planning Group Note  09/15/2012 11:41 AM  Participation Quality:  Did not attend    Ida Rogue 09/15/2012, 11:41 AM

## 2012-09-15 NOTE — Progress Notes (Signed)
Patient ID: Benjamin [name], male   DOB: Apr 13, 1981, 32 y.o.   MRN: [id number]  D: Pt continues to lay in bed each evening during the assessment. Writer was unable to get pt to verbally answer questions, however pt did shake his head "no" to getting any meds. Pt turned to his side away from the writer.   A:  Support and encouragement was offered. 15 min checks continued for safety.  R: Pt remains safe.

## 2012-09-15 NOTE — Clinical Social Work Note (Signed)
[facility] Group Notes:  (Counselor/Nursing/MHT/Case Management/Adjunct)  06/18/2012 12:00 PM  Type of Therapy:  Group Therapy  Participation Level:  Did Not Attend  Smart, Heather N 06/18/2012, 12:00 PM  

## 2012-09-16 NOTE — Clinical Social Work Note (Signed)
[facility] Group Notes:  (Counselor/Nursing/MHT/Case Management/Adjunct)  06/17/2012 2:20 PM  Type of Therapy:  Group Therapy  Participation Level:  Did Not Attend    Summary of Progress/Problems: .balance: The topic for group was balance in life.  Pt participated in the discussion about when their life was in balance and out of balance and how this feels.  Pt discussed ways to get back in balance and short term goals they can work on to get where they want to be.    Vanessa Alesi B 06/17/2012, 2:20 PM  

## 2012-09-16 NOTE — Progress Notes (Signed)
[facility] Group Notes:  (Nursing/MHT/Case Management/Adjunct)  Date:  09/16/2012  Time:  4:12 PM  Type of Therapy:  Psychoeducational Skills  Participation Level:  Did Not Attend   Summary of Progress/Problems: Benjamin [name] did not attend group on sharing activity and support systems.  Wandra Scot 09/16/2012, 4:12 PM

## 2012-09-16 NOTE — Treatment Plan (Signed)
Interdisciplinary Treatment Plan Update (Adult)  Date: 09/16/2012  Time Reviewed: 11:17 AM   Progress in Treatment: Attending groups: No Participating in groups: No Taking medication as prescribed: No: No Tolerating medication: No   Family/Significant other contact made:  Yes  Mother and brother Patient understands diagnosis:  Yes As evidenced by asking for help with mood stabilization, paranoia Discussing patient identified problems/goals with staff:  Yes See below Medical problems stabilized or resolved:  Yes Denies suicidal/homicidal ideation: Unable to assess Issues/concerns per patient self-inventory:  Not filled out Other:  New problem(s) identified: N/A  Reason for Continuation of Hospitalization: Delusions  Depression Medication stabilization  Interventions implemented related to continuation of hospitalization:  Currently unresponsive verbally.  Staying in bed.  Leaving room only to go to cafeteria   Encourage group attendance and participation  Encourage medication by PO    Additional comments: Dr to ask for second opinion to force meds.  CSW to initiate referral to [facility]  Estimated length of stay: 4-5 days  Discharge Plan: see below  New goal(s): N/A  Review of initial/current patient goals per problem list:   1.  Goal(s): Stabilize mood with the use of medication/therapeutic milieu  Met:  No  Target date:2/18  As evidenced [id number] currently presents as depressed.  He looks despondent, presents in a grim manner and endorses depression.  By d/c he will rate his depression at a 4 or less on a 10 scale.  2.  Goal (s): Eliminate psychosis with the use of medication/therapeutic milieu  Met:  No  Target date:2/18 As evidenced [id number] is paranoid and delusional.   He believes that staff are conspiring against him, and is now not even talking to Dr    He also is avoiding certain foods because he believes they are poison. 3.  Goal(s): Identify dispositional plan,  outpt provider  Met:  No  Target date:2/18  As evidenced [id number] will figure out where he will stay at d/c, thus dictating where he will follow up for outpt services  He may go to [facility] from here  4.  Goal(s):  Met:  No  Target date:  As evidenced by:  Attendees: Patient: Benjamin [name]  09/16/2012 10:44AM  Family:     Physician:  Thedore Mins 09/16/2012 11:17 AM   Nursing:  Joslyn Devon  09/16/2012 11:17 AM   Clinical Social Worker:  Richelle Ito 09/16/2012 11:17 AM   Extender:   09/16/2012 11:17 AM   Other:     Other:     Other:     Other:      Scribe for Treatment Team:   Ida Rogue, 09/16/2012 11:17 AM

## 2012-09-16 NOTE — Progress Notes (Signed)
Patient ID: Benjamin [name], male   DOB: 27-Jul-1981, 32 y.o.   MRN: [id number] [facility] MD Progress Note  09/16/2012 12:07 PM Benjamin [name]  MRN:  [id number]  Subjective: Patient continues to be  non verbal  and only answer questions by shrugging his shoulders. He reports paranoid ideations on admission and  appears depressed, he is self isolated, and withdrawn with minimal interaction with staffs and peer.  ADL's:  Impaired  Sleep: 6.75 per documentation.  Appetite:  Poor  Suicidal Ideation:  Did not respond to the questions. N/a Homicidal Ideation: Did not respond to the questions. N/a AEB (as evidenced by):Patient's affect, behavior and mannerism.Marland Kitchen  Psychiatric Specialty Exam: Review of Systems  Constitutional: Negative.  Negative for fever, chills, weight loss, malaise/fatigue and diaphoresis.  HENT: Negative for congestion and sore throat.   Eyes: Negative for blurred vision, double vision and photophobia.  Respiratory: Negative for cough, shortness of breath and wheezing.   Cardiovascular: Negative for chest pain, palpitations and PND.  Gastrointestinal: Negative for heartburn, nausea, vomiting, abdominal pain, diarrhea and constipation.  Musculoskeletal: Negative for myalgias, joint pain and falls.  Neurological: Negative for dizziness, tingling, tremors, sensory change, speech change, focal weakness, seizures, loss of consciousness, weakness and headaches.  Endo/Heme/Allergies: Negative for polydipsia. Does not bruise/bleed easily.  Psychiatric/Behavioral: Positive for depression. Negative for suicidal ideas, hallucinations, memory loss and substance abuse. The patient is nervous/anxious. The patient does not have insomnia.     Blood pressure 98/67, pulse 87, temperature 98.7 F (37.1 C), temperature source Oral, resp. rate 14, height 5' 8.25" (1.734 m), weight 69.4 kg (153 lb).Body mass index is 23.08 kg/(m^2).  General Appearance: poor grooming  Eye Contact::  Fair  Speech:   minimal  Volume:  decreased  Mood:  depressed  Affect:  Restricted  Thought Process:  Incoherent  Orientation:  NA  Thought Content:  Negative  Suicidal Thoughts:  No  Homicidal Thoughts:  No  Memory:  Immediate;   Poor  Judgement:  Impaired  Insight:  Lacking  Psychomotor Activity:  Quiet, still  Concentration:  Poor  Recall:  None  Akathisia:  No  Handed:  Right  AIMS (if indicated):     Assets:  Communication Skills Desire for Improvement Housing Physical Health Social Support Talents/Skills  Sleep:  Number of Hours: 6.75   Current Medications: Current Facility-Administered Medications  Medication Dose Route Frequency Provider Last Rate Last Dose  . acetaminophen (TYLENOL) tablet 650 mg  650 mg Oral Q6H PRN Kerry Hough, PA      . alum & mag hydroxide-simeth (MAALOX/MYLANTA) 200-200-20 MG/5ML suspension 30 mL  30 mL Oral Q4H PRN Kerry Hough, PA      . benztropine (COGENTIN) tablet 1 mg  1 mg Oral BID Mekai Wilkinson   1 mg at 09/14/12 1639  . citalopram (CELEXA) tablet 20 mg  20 mg Oral Daily Joshu Furukawa   20 mg at 09/14/12 0834  . cloNIDine (CATAPRES) tablet 0.1 mg  0.1 mg Oral Q8H PRN Torion Hulgan      . feeding supplement (ENSURE COMPLETE) liquid 237 mL  237 mL Oral Q2000 Lavena Bullion, RD   237 mL at 09/08/12 2102  . haloperidol (HALDOL) tablet 5 mg  5 mg Oral BID Jacelyn Cuen      . LORazepam (ATIVAN) injection 2 mg  2 mg Intramuscular Q6H PRN Janine Reller      . LORazepam (ATIVAN) tablet 2 mg  2 mg Oral Q6H PRN Brian Kocourek      .  magnesium hydroxide (MILK OF MAGNESIA) suspension 30 mL  30 mL Oral Daily PRN Kerry Hough, PA      . multivitamin with minerals tablet 1 tablet  1 tablet Oral Daily Lavena Bullion, RD   1 tablet at 09/14/12 0834  . traZODone (DESYREL) tablet 50 mg  50 mg Oral QHS PRN Aris Moman        Lab Results:  No results found for this or any previous visit (from the past 48 hour(s)).  Physical Findings: AIMS:  Facial and Oral Movements Muscles of Facial Expression: None, normal Lips and Perioral Area: None, normal Jaw: None, normal Tongue: None, normal,Extremity Movements Upper (arms, wrists, hands, fingers): None, normal Lower (legs, knees, ankles, toes): None, normal, Trunk Movements Neck, shoulders, hips: None, normal, Overall Severity Severity of abnormal movements (highest score from questions above): None, normal Incapacitation due to abnormal movements: None, normal Patient's awareness of abnormal movements (rate only patient's report): No Awareness, Dental Status Current problems with teeth and/or dentures?: No Does patient usually wear dentures?: No  CIWA:  CIWA-Ar Total: 0 COWS:  COWS Total Score: 1  Treatment Plan Summary: Daily contact with patient to assess and evaluate symptoms and progress in treatment Medication management  Plan: Supportive approach/coping skills/stablization.. Encouraged out of room, participation in group sessions and application of coping skills when distressed. Will continue to monitor response to/adverse effects of medications in use to assure effectiveness. Continue to monitor mood, behavior and interaction with staff and other patients. Increase Haldol to 5mg  po BID for delusions.  Medical Decision Making Problem Points:  Established problem, stable/improving (1), Review of last therapy session (1) and Review of psycho-social stressors (1) Data Points:  Review or order medicine tests (1) Review of medication regiment & side effects (2)  I certify that inpatient services furnished can reasonably be expected to improve the patient's condition.  Thedore Mins, MD 09/16/2012, 12:07 PM

## 2012-09-16 NOTE — Progress Notes (Signed)
Patient ID: Benjamin [name], male   DOB: 05-18-1981, 32 y.o.   MRN: [id number] Patient remains unresponsive to staff with no interaction.  He is isolative to room and does not participate in any activities on the unit.  He has refused all medications today.  Patient remains in room with no interaction with staff or peers.  Patient's brother called and patient would not come to the phone.  His mother called, however, was not able to give any information due no consent on file.  Patient is refusing some of his meals.  Continue to assess and monitor medication management.  Safety checks continued every 15 minutes per protocol.  Patient cannot be assessed for SI/HI/AVH due to unresponsiveness.

## 2012-09-17 DIAGNOSIS — R638 Other symptoms and signs concerning food and fluid intake: Secondary | ICD-10-CM

## 2012-09-17 LAB — CBC
MCV: 88.9 fL (ref 78.0–100.0)
Platelets: 228 10*3/uL (ref 150–400)
RBC: 4.67 MIL/uL (ref 4.22–5.81)
RDW: 12.9 % (ref 11.5–15.5)
WBC: 6.1 10*3/uL (ref 4.0–10.5)

## 2012-09-17 LAB — COMPREHENSIVE METABOLIC PANEL
ALT: 27 U/L (ref 0–53)
AST: 15 U/L (ref 0–37)
Albumin: 3.6 g/dL (ref 3.5–5.2)
Alkaline Phosphatase: 31 U/L — ABNORMAL LOW (ref 39–117)
CO2: 26 mEq/L (ref 19–32)
Chloride: 102 mEq/L (ref 96–112)
GFR calc non Af Amer: >90 mL/min (ref >90)
Potassium: 3.6 mEq/L (ref 3.5–5.1)
Sodium: 138 mEq/L (ref 135–145)
Total Bilirubin: 0.4 mg/dL (ref 0.3–1.2)

## 2012-09-17 MED ORDER — ENSURE COMPLETE PO LIQD: 237.0000 mL | Freq: Two times a day (BID) | ORAL | Status: DC

## 2012-09-17 MED ORDER — ENSURE COMPLETE PO LIQD
237.0000 mL | Freq: Three times a day (TID) | ORAL | Status: DC
  Filled 2012-09-17 (×3): qty 237

## 2012-09-17 NOTE — Progress Notes (Signed)
D: Pt continues to acknowledge presence of others by opening eyes when initially spoken to but remains unresponsive. Implicitly refused nutritional supplement at 2000 by not speaking.  Facial expression is blank with absent affect.  Unable to assess for SI/HI and AVH due to refusal to respond to questions.  Remained in bed throughout evening shift except to use bathroom, at which time some of physical assessment was able to be completed through observation.  A: Pt encouraged to communicate in any way he wishes, with no response.  Was reminded that he may be discharged from [facility] if he continues to be resistant to all care.  No medications administered.  Q15 minute safety checks maintained as per unit protocol.  RN checked on Pt Q30 minutes as well due to lack of contract for safety.  R: Pt impervious to staff overtures.  Safety maintained. Dion Saucier RN

## 2012-09-17 NOTE — Clinical Social Work Note (Signed)
[facility] Group Notes:  (Counselor/Nursing/MHT/Case Management/Adjunct)  06/18/2012 12:00 PM  Type of Therapy:  Group Therapy  Participation Level:  Did Not Attend  Smart, Heather N 06/18/2012, 12:00 PM  

## 2012-09-17 NOTE — Progress Notes (Signed)
Psychoeducational Group Note  Date:  09/17/2012 Time: 2000 Group Topic/Focus:  Wrap-Up Group:   The focus of this group is to help patients review their daily goal of treatment and discuss progress on daily workbooks.  Participation Level: Did Not Attend  Participation Quality:  Not Applicable  Affect:  Not Applicable  Cognitive:  Not Applicable  Insight:  Not Applicable  Engagement in Group: Not Applicable  Additional Comments:  Pt. refused to attend wrap up group  Gwenevere Ghazi Patience 09/17/2012, 9:42 PM

## 2012-09-17 NOTE — Progress Notes (Signed)
Patient ID: Benjamin [name], male   DOB: 25-May-1981, 32 y.o.   MRN: [id number] [facility] MD Progress Note  09/17/2012 9:49 AM Merle Cirelli  MRN:  [id number]  Subjective: Patient is non-compliant with his medications and has not been attending other units activities. He is socially withdrawn with minimal interactions with staffs and peers. Patient remains depressed and paranoid, only eating fruits and drinking milk avoiding other food for "fear of being poisoned". He refused to sign consent for me to talk to his family members.  ADL's:  Impaired  Sleep: 6.75 per documentation.  Appetite:  Poor  Suicidal Ideation:  Did not respond to the questions. N/a Homicidal Ideation: Did not respond to the questions. N/a AEB (as evidenced by):Patient's affect, behavior and mannerism.Marland Kitchen  Psychiatric Specialty Exam: Review of Systems  Constitutional: Negative.  Negative for fever, chills, weight loss, malaise/fatigue and diaphoresis.  HENT: Negative for congestion and sore throat.   Eyes: Negative for blurred vision, double vision and photophobia.  Respiratory: Negative for cough, shortness of breath and wheezing.   Cardiovascular: Negative for chest pain, palpitations and PND.  Gastrointestinal: Negative for heartburn, nausea, vomiting, abdominal pain, diarrhea and constipation.  Musculoskeletal: Negative for myalgias, joint pain and falls.  Neurological: Negative for dizziness, tingling, tremors, sensory change, speech change, focal weakness, seizures, loss of consciousness, weakness and headaches.  Endo/Heme/Allergies: Negative for polydipsia. Does not bruise/bleed easily.  Psychiatric/Behavioral: Positive for depression. Negative for suicidal ideas, hallucinations, memory loss and substance abuse. The patient is nervous/anxious. The patient does not have insomnia.     Blood pressure 98/68, pulse 86, temperature 98.1 F (36.7 C), temperature source Oral, resp. rate 17, height 5' 8.25" (1.734 m), weight  69.4 kg (153 lb).Body mass index is 23.08 kg/(m^2).  General Appearance: poor grooming  Eye Contact::  Fair  Speech:  minimal  Volume:  decreased  Mood:  depressed  Affect:  Restricted  Thought Process:  Incoherent  Orientation:  NA  Thought Content:  paranoia  Suicidal Thoughts:  No  Homicidal Thoughts:  No  Memory:  Immediate;   Poor  Judgement:  Impaired  Insight:  Lacking  Psychomotor Activity:  Quiet, still  Concentration:  Poor  Recall:  None  Akathisia:  No  Handed:  Right  AIMS (if indicated):     Assets:  Communication Skills Desire for Improvement Housing Physical Health Social Support Talents/Skills  Sleep:  Number of Hours: 6.75   Current Medications: Current Facility-Administered Medications  Medication Dose Route Frequency Provider Last Rate Last Dose  . acetaminophen (TYLENOL) tablet 650 mg  650 mg Oral Q6H PRN Kerry Hough, PA      . alum & mag hydroxide-simeth (MAALOX/MYLANTA) 200-200-20 MG/5ML suspension 30 mL  30 mL Oral Q4H PRN Kerry Hough, PA      . benztropine (COGENTIN) tablet 1 mg  1 mg Oral BID Mcgregor Tinnon   1 mg at 09/14/12 1639  . citalopram (CELEXA) tablet 20 mg  20 mg Oral Daily Paitynn Mikus   20 mg at 09/14/12 0834  . cloNIDine (CATAPRES) tablet 0.1 mg  0.1 mg Oral Q8H PRN Jabari Swoveland      . feeding supplement (ENSURE COMPLETE) liquid 237 mL  237 mL Oral Q2000 Lavena Bullion, RD   237 mL at 09/08/12 2102  . haloperidol (HALDOL) tablet 5 mg  5 mg Oral BID Robel Wuertz      . LORazepam (ATIVAN) injection 2 mg  2 mg Intramuscular Q6H PRN Malli Falotico      .  LORazepam (ATIVAN) tablet 2 mg  2 mg Oral Q6H PRN Braylee Lal      . magnesium hydroxide (MILK OF MAGNESIA) suspension 30 mL  30 mL Oral Daily PRN Kerry Hough, PA      . multivitamin with minerals tablet 1 tablet  1 tablet Oral Daily Lavena Bullion, RD   1 tablet at 09/14/12 0834  . traZODone (DESYREL) tablet 50 mg  50 mg Oral QHS PRN Tahje Borawski         Lab Results:  No results found for this or any previous visit (from the past 48 hour(s)).  Physical Findings: AIMS: Facial and Oral Movements Muscles of Facial Expression: None, normal Lips and Perioral Area: None, normal Jaw: None, normal Tongue: None, normal,Extremity Movements Upper (arms, wrists, hands, fingers): None, normal Lower (legs, knees, ankles, toes): None, normal, Trunk Movements Neck, shoulders, hips: None, normal, Overall Severity Severity of abnormal movements (highest score from questions above): None, normal Incapacitation due to abnormal movements: None, normal Patient's awareness of abnormal movements (rate only patient's report): No Awareness, Dental Status Current problems with teeth and/or dentures?: No Does patient usually wear dentures?: No  CIWA:  CIWA-Ar Total: 0 COWS:  COWS Total Score: 1  Treatment Plan Summary: Daily contact with patient to assess and evaluate symptoms and progress in treatment Medication management  Plan: Supportive approach/coping skills/stablization.. Will continue to monitor response to/adverse effects of medications in use to assure effectiveness. Continue to monitor mood, behavior and interaction with staff and other patients. Continue Haldol  5mg  po BID for delusions. Encourage patient to attend group and other unit milieu. Medical Decision Making Problem Points:  Established problem, worsening (1), Review of last therapy session (1) and Review of psycho-social stressors (1) Data Points:  Review of medication regiment & side effects (2)  I certify that inpatient services furnished can reasonably be expected to improve the patient's condition.  Thedore Mins, MD 09/17/2012, 9:49 AM

## 2012-09-17 NOTE — Progress Notes (Signed)
Patient's mother, Jostin Rue, called.  Stated she did not have a code number but knew patient was already here. States patient has been here a while and that his admission was confirmed earlier in his stay.  Mother upset, states she has not spoken to anyone at [facility] and has not spoken to her son. Wants to speak to son. Told mother I could not give her any information about the patient without a code number.  I agreed to call mother back. Gave mother my name and phone number.  Spoke with Dr. Jannifer Franklin who states patient told him he did not want mother or brother to receive any information. Spoke with patient's RN and MHT. Both spoke with patient this am, telling him mother had called and would like to speak to him. Patient refused to respond. Per patient's RN, she spoke with patient's mother this am and explained to her patient was not responding to their request for patient to speak with her.   Attempted to call mother back to let her know patient has not given consent for treatment team to speak with her. Three attempts made to number provided by mother. No answer.   Rosey Bath, RN

## 2012-09-17 NOTE — Progress Notes (Signed)
Recreation Therapy Notes  09/17/2012  Time: 9:30am   Group Topic/Focus: Exercise   Participation Level:  Did not attend   Jearl Klinefelter, LRT/CTRS    Arica Bevilacqua L 09/17/2012 10:12 AM
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
## 2012-09-17 NOTE — Consult Note (Signed)
[facility] Medical Consultation  Benjamin [name] NGE:[id number] DOB: 11-11-1980 DOA: 09/07/2012 PCP: No primary provider on file.   Requesting physician: Dr Jannifer Franklin Date of consultation: 09/17/12 Reason for consultation: Patient refusing to eat/ nuitritional status  Impression/Recommendations Principal Problem:   Major depressive disorder, recurrent episode, severe, specified as with psychotic behavior Active Problems:   History of substance abuse   Psychosis   Decreased oral intake  #1. Decreased Oral intake Likely secondary to MDD with psychosis. Patient refusing to speak with decreased oral intake and only eating fruit and milk due to fear of poisioning per medical record. CMET from 09/05/12 with albumin of 5.0. CBC from 09/05/12 with nl WBC. TSH from 09/08/12 is 2.908 and WNL. Will repeat CBC, CMET. Check UA. Patient with no respiratory symptoms. Will recommend ensure with medications TID, Ice cream, increase fruit intake. Will also recommend a dietician consult. If patient not eating for few weeks may consider ethics consult.  #2. MDD with psychosis Per primary team.  #3. Hx substance abuse Per primary team.   Nothing further to add at this time. Will sign off,  please call with questions.   Thank you for this consultation.  Chief Complaint: Depression  HPI:  Patient refusing to speak and as such hx obtained from medical records and NP. Benjamin [name] is a 31yo WM with hx of depression, HTN, mental disorder who was admitted to [facility] on 09/08/12 for MDD with pshycosis. We were consulted to assess patient as over past few days he has been refusing to eat, and only eating fruit and milk due to fear of being poisoned. Patient refusing to speak, however ff commands.  Review of Systems:  Unable to assess as patient not talking.  Past Medical History  Diagnosis Date  . Herniated disc     x3  . Hypertension   . Mental disorder   . Depression    Past Surgical History  Procedure  Laterality Date  . Shoulder surgery     Social History:  reports that he quit smoking about 5 months ago. His smoking use included Cigarettes. He smoked 1.00 pack per day. He does not have any smokeless tobacco history on file. He reports that he does not drink alcohol or use illicit drugs.  No Known Allergies History reviewed. No pertinent family history.  Prior to Admission medications   Not on File   Physical Exam: Blood pressure 98/68, pulse 86, temperature 98.1 F (36.7 C), temperature source Oral, resp. rate 17, height 5' 8.25" (1.734 m), weight 69.4 kg (153 lb). Filed Vitals:   09/16/12 0740 09/17/12 0743 09/17/12 0744 09/17/12 0820  BP: 98/67 94/63 86/51  98/68  Pulse: 87 83 86   Temp:  98.1 F (36.7 C)    TempSrc:  Oral    Resp:  17    Height:      Weight:         General:  WDWN in NAD. Not speaking  Eyes: PEERLA. EOMI.  ENT: Orophyrnx clear, no lesions, no exudate.  Neck: Supple with no LAD. No JVD.  Cardiovascular: RRR no m/r/g. No LE edema  Respiratory: CTAB  Abdomen: Soft/NT/ND/+BS  Skin: No rashes or lesions  Musculoskeletal: 5/5 BUE strength, 5/5 BLE strength  Psychiatric: Depressed. Mute, flat affect. Depressed mood. Poor insight, poor judgement  Neurologic: Alert. Mute. CN 2-12 grossly intact. Moving extremities spontaneously.  Labs on Admission:  Basic Metabolic Panel: No results found for this basename: NA, K, CL, CO2, GLUCOSE, BUN, CREATININE, CALCIUM, MG, PHOS,  in the last 168 hours Liver Function Tests: No results found for this basename: AST, ALT, ALKPHOS, BILITOT, PROT, ALBUMIN,  in the last 168 hours No results found for this basename: LIPASE, AMYLASE,  in the last 168 hours No results found for this basename: AMMONIA,  in the last 168 hours CBC: No results found for this basename: WBC, NEUTROABS, HGB, HCT, MCV, PLT,  in the last 168 hours Cardiac Enzymes: No results found for this basename: CKTOTAL, CKMB, CKMBINDEX, TROPONINI,  in  the last 168 hours BNP: No components found with this basename: POCBNP,  CBG: No results found for this basename: GLUCAP,  in the last 168 hours  Radiological Exams on Admission: No results found.  EKG: None  Time spent: 50 mins  [facility] Pager [phone]  If 7PM-7AM, please contact night-coverage [email or web address] Password [facility] 09/17/2012, 1:38 PM

## 2012-09-17 NOTE — Progress Notes (Signed)
Nutrition Note  Consult.  Patient last seen by RD on admit 2/5.  Dx with Severe Malnutrition at that time.  There are no new weights.    According to chart patient is refusing to eat because he is afraid that someone is trying to poison him.  Will only eat fruit and drink milk.  Considering Ethics consult if patient continues not to eat.  Per RD note 2/5, patient reported that he had been fasting for 29 days prior to admit.  Attempted to see patient x 2 today but patient was sleeping.  Called patient's name and patient opened eyes but would not respond.    Will increase Ensure Complete po to four times daily, each supplement provides 350 kcal and 13 grams of protein.  Continue MVI.    Please consult if further nutrition needs. Will begin to follow. Consider Enteral Nutrition if patient continues to refuse meals and supplements. Recommend obtain a current weight.  Oran Rein, RD, LDN Clinical Inpatient Dietitian Pager:  [phone] Weekend and after hours pager:  [phone]

## 2012-09-17 NOTE — Progress Notes (Signed)
Pt in room sitting on side of the bed. Pt continues to not respond to Clinical research associate or any other staff on the unit.   Medications offered as ordered per MD. B/p rechecked manually by writer. Verbal support given. Pt encouraged to drink fluids. MD made aware of pt B/p this morning. B/p 98/68.   Pt avoids staff and isolates in his room. Pt noncompliant with treatment. Pt do attend meals.

## 2012-09-18 DIAGNOSIS — R001 Bradycardia, unspecified: Secondary | ICD-10-CM | POA: Diagnosis not present

## 2012-09-18 NOTE — Progress Notes (Signed)
Patient ID: Benjamin [name], male   DOB: [birth year], 32 y.o.   MRN: [id number] D. The patient remained in bed all evening. He avoids any eye contact and does not speak. When he did not get out of bed to have his blood drawn, the lab tech was brought to his room.   A. Attempt made to engage patient in a 1:1 conversation. Spoke to patient about the necessity of having his blood drawn. Offered the patient Ensure as well as other options for snack. R. After speaking to the patient about having his blood drawn, he held his arm out straight and cooperated with the lab tech. He refused Ensure and did not consume any other food or beverage. Will continue to monitor.

## 2012-09-18 NOTE — Progress Notes (Signed)
D: pt in bed resting quietly with eyes closed on approach. Opened eyes spontaneously to name. Appears obtunded and is mute. He did not acknowledge writers assessment questions and refused to take his medications.  A: Safety has been maintained with Q15 minute observation. Support and encouragement provided. Encouraged conversation and medication compliance. Offered Ensure but he refused.  R: Pt remains safe. Will continue Q15 minute obs, medication reinforcement and current POC.

## 2012-09-18 NOTE — Progress Notes (Signed)
Patient ID: Benjamin [name], male   DOB: 1980/10/22, 32 y.o.   MRN: [id number] Psychoeducational Group Note  Date:  09/18/2012 Time:0930am  Group Topic/Focus:  Identifying Needs:   The focus of this group is to help patients identify their personal needs that have been historically problematic and identify healthy behaviors to address their needs.  Participation Level:  Did Not Attend  Participation Quality:    Affect:  Cognitive:   Insight:  Engagement in Group:  Additional Comments:  Inventory group   [name] 09/18/2012,10:19 AM

## 2012-09-18 NOTE — Progress Notes (Signed)
Adult Psychoeducational Group Note  Date:  09/18/2012 Time:  10:20  Group Topic/Focus:  Healthy Coping Skills  Participation Level:  Did Not Attend  Participation Quality:    Affect:   Cognitive:    Insight:   Engagement in Group:    Modes of Intervention:    Additional Comments:   Kinleigh Nault Sean 09/18/2012, 10:40 AM 

## 2012-09-18 NOTE — Progress Notes (Signed)
Psychoeducational Group Note  Date:  09/18/2012 Time:  2000  Group Topic/Focus:  Wrap-Up Group:   The focus of this group is to help patients review their daily goal of treatment and discuss progress on daily workbooks.  Participation Level: Did Not Attend  Participation Quality:  Not Applicable  Affect:  Not Applicable  Cognitive:  Not Applicable  Insight:  Not Applicable  Engagement in Group: Not Applicable  Additional Comments:  The patient slept in his bed while group was being conducted.   Amana Bouska S 09/18/2012, 10:14 PM

## 2012-09-18 NOTE — Clinical Social Work Note (Signed)
[facility] Group Notes: (Clinical Social Work)   09/18/2012      Type of Therapy:  Group Therapy   Participation Level:  Did Not Attend    Ambrose Mantle, LCSW 09/18/2012, 1:18 PM

## 2012-09-18 NOTE — Progress Notes (Signed)
I certify that inpatient services furnished can reasonably be expected to improve the patient's condition.  Furthermore, forced medications are in order.  Form finished. Orson Aloe, MD, [facility]

## 2012-09-18 NOTE — Progress Notes (Addendum)
Patient ID: Benjamin [name], male   DOB: 1981-03-25, 32 y.o.   MRN: [id number] [facility] MD Progress Note  09/18/2012 11:33 AM Benjamin [name]  MRN:  [id number]  Subjective: Met with the patient 1:1 today to discuss his progress and reviewed his chart. He is non-verbal, with poor eye contact. He does not speak at all and stares into space. He has not been taking any medication and does not attend groups. He was evaluated yesterday by IM due to his poor oral intake. Labs were ordered and reviewed and all are within normal limits. Today his vitals show a Pulse of 48 this morning. On retake it was 46. Patient was examined.  ADL's:  Impaired  Sleep: 6.75 per documentation.  Appetite:  Poor  Suicidal Ideation:  Did not respond to the questions. N/a Homicidal Ideation: Did not respond to the questions. N/a AEB (as evidenced by):Patient's affect, behavior and mannerism.Marland Kitchen  Psychiatric Specialty Exam: Review of Systems  Constitutional: Negative.  Negative for fever, chills, weight loss, malaise/fatigue and diaphoresis.  HENT: Negative for congestion and sore throat.   Eyes: Negative for blurred vision, double vision and photophobia.  Respiratory: Negative for cough, shortness of breath and wheezing.   Cardiovascular: Negative for chest pain, palpitations and PND.  Gastrointestinal: Negative for heartburn, nausea, vomiting, abdominal pain, diarrhea and constipation.  Musculoskeletal: Negative for myalgias, joint pain and falls.  Neurological: Negative for dizziness, tingling, tremors, sensory change, speech change, focal weakness, seizures, loss of consciousness, weakness and headaches.  Endo/Heme/Allergies: Negative for polydipsia. Does not bruise/bleed easily.  Psychiatric/Behavioral: Positive for depression. Negative for suicidal ideas, hallucinations, memory loss and substance abuse. The patient is nervous/anxious. The patient does not have insomnia.     Blood pressure 106/66, pulse 48, temperature  97.7 F (36.5 C), temperature source Oral, resp. rate 16, height 5' 8.25" (1.734 m), weight 69.4 kg (153 lb).Body mass index is 23.08 kg/(m^2).  General Appearance: poor grooming  Eye Contact:: poor stares into space  Speech:  none  Volume:  none  Mood:  depressed  Affect:  Restricted  Thought Process:  Unable to assess does not appear to respond to internal stimulation.  Orientation:  NA  Thought Content:  paranoia  Suicidal Thoughts: no answer  Homicidal Thoughts: no answer  Memory:  Immediate;   Poor  Judgement:  Impaired  Insight:  Lacking  Psychomotor Activity:  + for psychomotor retardation  Concentration:  Poor  Recall:  None  Akathisia:  No  Handed:  Right  AIMS (if indicated):     Assets:  Communication Skills Desire for Improvement Housing Physical Health Social Support Talents/Skills  Sleep:  Number of Hours: 6.75   Current Medications: Current Facility-Administered Medications  Medication Dose Route Frequency Provider Last Rate Last Dose  . acetaminophen (TYLENOL) tablet 650 mg  650 mg Oral Q6H PRN Kerry Hough, PA      . alum & mag hydroxide-simeth (MAALOX/MYLANTA) 200-200-20 MG/5ML suspension 30 mL  30 mL Oral Q4H PRN Kerry Hough, PA      . benztropine (COGENTIN) tablet 1 mg  1 mg Oral BID Mojeed Akintayo   1 mg at 09/14/12 1639  . citalopram (CELEXA) tablet 20 mg  20 mg Oral Daily Mojeed Akintayo   20 mg at 09/14/12 0834  . cloNIDine (CATAPRES) tablet 0.1 mg  0.1 mg Oral Q8H PRN Mojeed Akintayo      . feeding supplement (ENSURE COMPLETE) liquid 237 mL  237 mL Oral TID WC & HS Jeoffrey Massed,  RD      . haloperidol (HALDOL) tablet 5 mg  5 mg Oral BID Mojeed Akintayo      . LORazepam (ATIVAN) injection 2 mg  2 mg Intramuscular Q6H PRN Mojeed Akintayo      . LORazepam (ATIVAN) tablet 2 mg  2 mg Oral Q6H PRN Mojeed Akintayo      . magnesium hydroxide (MILK OF MAGNESIA) suspension 30 mL  30 mL Oral Daily PRN Kerry Hough, PA      . multivitamin with  minerals tablet 1 tablet  1 tablet Oral Daily Lavena Bullion, RD   1 tablet at 09/14/12 0834  . traZODone (DESYREL) tablet 50 mg  50 mg Oral QHS PRN Mojeed Akintayo        Lab Results:  Results for orders placed during the [name] encounter of 09/07/12 (from the past 48 hour(s))  CBC     Status: None   Collection Time    09/17/12  7:56 PM      Result Value Range   WBC 6.1  4.0 - 10.5 K/uL   RBC 4.67  4.22 - 5.81 MIL/uL   Hemoglobin 14.6  13.0 - 17.0 g/dL   HCT 16.1  09.6 - 04.5 %   MCV 88.9  78.0 - 100.0 fL   MCH 31.3  26.0 - 34.0 pg   MCHC 35.2  30.0 - 36.0 g/dL   RDW 40.9  81.1 - 91.4 %   Platelets 228  150 - 400 K/uL  COMPREHENSIVE METABOLIC PANEL     Status: Abnormal   Collection Time    09/17/12  7:56 PM      Result Value Range   Sodium 138  135 - 145 mEq/L   Potassium 3.6  3.5 - 5.1 mEq/L   Chloride 102  96 - 112 mEq/L   CO2 26  19 - 32 mEq/L   Glucose, Bld 82  70 - 99 mg/dL   BUN 17  6 - 23 mg/dL   Creatinine, Ser 7.82  0.50 - 1.35 mg/dL   Calcium 9.1  8.4 - 95.6 mg/dL   Total Protein 6.7  6.0 - 8.3 g/dL   Albumin 3.6  3.5 - 5.2 g/dL   AST 15  0 - 37 U/L   ALT 27  0 - 53 U/L   Alkaline Phosphatase 31 (*) 39 - 117 U/L   Total Bilirubin 0.4  0.3 - 1.2 mg/dL   GFR calc non Af Amer >90  >90 mL/min   GFR calc Af Amer >90  >90 mL/min   Comment:            The eGFR has been calculated     using the CKD EPI equation.     This calculation has not been     validated in all clinical     situations.     eGFR's persistently     <90 mL/min signify     possible Chronic Kidney Disease.    Physical Findings: COR: Regular rate but slow without RMBG. AIMS: Facial and Oral Movements Muscles of Facial Expression: None, normal Lips and Perioral Area: None, normal Jaw: None, normal Tongue: None, normal,Extremity Movements Upper (arms, wrists, hands, fingers): None, normal Lower (legs, knees, ankles, toes): None, normal, Trunk Movements Neck, shoulders, hips: None, normal,  Overall Severity Severity of abnormal movements (highest score from questions above): None, normal Incapacitation due to abnormal movements: None, normal Patient's awareness of abnormal movements (rate only patient's report): No Awareness, Dental  Status Current problems with teeth and/or dentures?: No Does patient usually wear dentures?: No  CIWA:  CIWA-Ar Total: 0 COWS:  COWS Total Score: 1  Treatment Plan Summary: Daily contact with patient to assess and evaluate symptoms and progress in treatment Medication management  Plan:  Will get EKG to document bradycardia. Will get 2nd opinion to force medication as symptoms are worsening Supportive approach/coping skills/stablization.. Will continue to monitor response to/adverse effects of medications in use to assure effectiveness. Continue to monitor mood, behavior and interaction with staff and other patients. Will leave current medications on chart and add to force meds after 2nd opinion is completed. Continue Haldol  5mg  po BID for delusions. Encourage patient to attend group and other unit milieu. Medical Decision Making Problem Points:  Established problem, worsening (1), Review of last therapy session (1) and Review of psycho-social stressors (1) Data Points:  Review of medication regiment & side effects (2)  I certify that inpatient services furnished can reasonably be expected to improve the patient's condition.   Rona Ravens. Hisayo Delossantos RPAC 09/18/2012, 11:33 AM

## 2012-09-19 MED ORDER — LORAZEPAM 1 MG PO TABS: 1.0000 mg | ORAL_TABLET | Freq: Once | ORAL | Status: AC

## 2012-09-19 MED ORDER — OLANZAPINE 10 MG PO TBDP
10.0000 mg | ORAL_TABLET | ORAL | Status: AC
  Filled 2012-09-19 (×2): qty 1

## 2012-09-19 MED ORDER — OLANZAPINE 10 MG IM SOLR
10.0000 mg | INTRAMUSCULAR | Status: AC
  Administered 2012-09-19: 10 mg via INTRAMUSCULAR
  Filled 2012-09-19 (×2): qty 10

## 2012-09-19 MED ORDER — RISPERIDONE 0.5 MG PO TBDP
ORAL_TABLET | ORAL | Status: AC
  Filled 2012-09-19: qty 1

## 2012-09-19 MED ORDER — LORAZEPAM 2 MG/ML IJ SOLN
1.0000 mg | Freq: Once | INTRAMUSCULAR | Status: AC
  Administered 2012-09-19: 1 mg via INTRAMUSCULAR

## 2012-09-19 MED ORDER — RISPERIDONE 0.5 MG PO TBDP
0.5000 mg | ORAL_TABLET | Freq: Every day | ORAL | Status: DC
  Filled 2012-09-19 (×3): qty 1

## 2012-09-19 NOTE — Progress Notes (Signed)
D- Patient is in bed in room and is unable/unwilling to ambulate.  No solids/fluids taken and I&O initiated.  Skin check done to assess breakdown and is WNL.  Unable to assess cognition. A- Report previous findings tp provider.  Continue I&O,15' checks for safety and encouragement of solids/fluids.  Continue current POC and evaluation of treatment goals.  R- No changes from previous assessment.

## 2012-09-19 NOTE — Consult Note (Signed)
MEDICATION RELATED CONSULT NOTE - INITIAL   Pharmacy Consult for Medications that do not cause bradycardia   No Known Allergies  Patient Measurements: Height: 5' 8.25" (173.4 cm) Weight: 153 lb (69.4 kg) IBW/kg (Calculated) : 68.98   Vital Signs: Temp: 97 F (36.1 C) (02/16 0752) Temp src: Oral (02/16 0752) BP: 102/61 mmHg (02/16 0753) Pulse Rate: 58 (02/16 0753) Intake/Output from previous day:   Intake/Output from this shift:    Labs:  Recent Labs  09/17/12 1956  WBC 6.1  HGB 14.6  HCT 41.5  PLT 228  CREATININE 0.63  ALBUMIN 3.6  PROT 6.7  AST 15  ALT 27  ALKPHOS 31*  BILITOT 0.4   Estimated Creatinine Clearance: 128.2 ml/min (by C-G formula based on Cr of 0.63).   Microbiology: No results found for this or any previous visit (from the past 720 hour(s)).  Medical History: Past Medical History  Diagnosis Date  . Herniated disc     x3  . Hypertension   . Mental disorder   . Depression     Medications:  Scheduled:  . benztropine  1 mg Oral BID  . citalopram  20 mg Oral Daily  . feeding supplement  237 mL Oral TID WC & HS  . haloperidol  5 mg Oral BID  . multivitamin with minerals  1 tablet Oral Daily    Assessment:  Both Haldol and Trazadone can cause  arrhythmias   Goal of Therapy:  Medications that will not cause bradycardia  Plan:  Zyprexa and Risperidone both can also cause tachycardia and severe hypotension.  Pamala Duffel L 09/19/2012,11:56 AM

## 2012-09-19 NOTE — Progress Notes (Addendum)
Patient ID: Benjamin [name], male   DOB: [birth year], 32 y.o.   MRN: [id number] [facility] MD Progress Note  09/19/2012 12:32 PM Benjamin [name]  MRN:  [id number]  Subjective: Met with the patient 1:1 today to discuss his progress and reviewed his chart. He remains secluded in his room, selectively mute, with poor eye contact.  Objective: He was evaluated yesterday by IM due to his poor oral intake, and a force medication 2nd opinion was completed and is on the chart.   He has developed a bradycardia in the last 36 hours, and has received an EKG, which showed normal sinus brady rhythm.  Anti-psychotic medication was not started.  Consulted with IM again this AM, who encouraged physical activity, but the patient refuses to get out of bed continuing to stare into space. ADL's:  Impaired  Sleep: 6.75 per documentation.  Appetite:  Poor  Suicidal Ideation:  Did not respond to the questions. N/a Homicidal Ideation: Did not respond to the questions. N/a AEB (as evidenced by):Patient's affect, behavior and mannerism.Marland Kitchen  Psychiatric Specialty Exam: Review of Systems  Constitutional: Negative.  Negative for fever, chills, weight loss, malaise/fatigue and diaphoresis.  HENT: Negative for congestion and sore throat.   Eyes: Negative for blurred vision, double vision and photophobia.  Respiratory: Negative for cough, shortness of breath and wheezing.   Cardiovascular: Negative for chest pain, palpitations and PND.  Gastrointestinal: Negative for heartburn, nausea, vomiting, abdominal pain, diarrhea and constipation.  Musculoskeletal: Negative for myalgias, joint pain and falls.  Neurological: Negative for dizziness, tingling, tremors, sensory change, speech change, focal weakness, seizures, loss of consciousness, weakness and headaches.  Endo/Heme/Allergies: Negative for polydipsia. Does not bruise/bleed easily.  Psychiatric/Behavioral: Positive for depression. Negative for suicidal ideas, hallucinations,  memory loss and substance abuse. The patient is nervous/anxious. The patient does not have insomnia.     Blood pressure 102/61, pulse 58, temperature 97 F (36.1 C), temperature source Oral, resp. rate 16, height 5' 8.25" (1.734 m), weight 69.4 kg (153 lb).Body mass index is 23.08 kg/(m^2).  General Appearance: poor grooming  Eye Contact:: poor stares into space  Speech:  none  Volume:  none  Mood:  depressed  Affect:  Restricted  Thought Process:  Unable to assess does not appear to respond to internal stimulation.  Orientation:  NA  Thought Content:  paranoia  Suicidal Thoughts: no answer  Homicidal Thoughts: no answer  Memory:  Immediate;   Poor  Judgement:  Impaired  Insight:  Lacking  Psychomotor Activity:  + for psychomotor retardation  Concentration:  Poor  Recall:  None  Akathisia:  No  Handed:  Right  AIMS (if indicated):     Assets:  Communication Skills Desire for Improvement Housing Physical Health Social Support Talents/Skills  Sleep:  Number of Hours: 6.25   Current Medications: Current Facility-Administered Medications  Medication Dose Route Frequency Provider Last Rate Last Dose  . acetaminophen (TYLENOL) tablet 650 mg  650 mg Oral Q6H PRN Kerry Hough, PA      . alum & mag hydroxide-simeth (MAALOX/MYLANTA) 200-200-20 MG/5ML suspension 30 mL  30 mL Oral Q4H PRN Kerry Hough, PA      . benztropine (COGENTIN) tablet 1 mg  1 mg Oral BID Mojeed Akintayo   1 mg at 09/14/12 1639  . citalopram (CELEXA) tablet 20 mg  20 mg Oral Daily Mojeed Akintayo   20 mg at 09/14/12 0834  . feeding supplement (ENSURE COMPLETE) liquid 237 mL  237 mL Oral TID WC & HS  Jeoffrey Massed, RD      . haloperidol (HALDOL) tablet 5 mg  5 mg Oral BID Mojeed Akintayo      . LORazepam (ATIVAN) injection 2 mg  2 mg Intramuscular Q6H PRN Mojeed Akintayo      . LORazepam (ATIVAN) tablet 2 mg  2 mg Oral Q6H PRN Mojeed Akintayo      . magnesium hydroxide (MILK OF MAGNESIA) suspension 30 mL  30  mL Oral Daily PRN Kerry Hough, PA      . multivitamin with minerals tablet 1 tablet  1 tablet Oral Daily Lavena Bullion, RD   1 tablet at 09/14/12 0834  . traZODone (DESYREL) tablet 50 mg  50 mg Oral QHS PRN Mojeed Akintayo        Lab Results:  Results for orders placed during the hospital encounter of 09/07/12 (from the past 48 hour(s))  CBC     Status: None   Collection Time    09/17/12  7:56 PM      Result Value Range   WBC 6.1  4.0 - 10.5 K/uL   RBC 4.67  4.22 - 5.81 MIL/uL   Hemoglobin 14.6  13.0 - 17.0 g/dL   HCT 45.4  09.8 - 11.9 %   MCV 88.9  78.0 - 100.0 fL   MCH 31.3  26.0 - 34.0 pg   MCHC 35.2  30.0 - 36.0 g/dL   RDW 14.7  82.9 - 56.2 %   Platelets 228  150 - 400 K/uL  COMPREHENSIVE METABOLIC PANEL     Status: Abnormal   Collection Time    09/17/12  7:56 PM      Result Value Range   Sodium 138  135 - 145 mEq/L   Potassium 3.6  3.5 - 5.1 mEq/L   Chloride 102  96 - 112 mEq/L   CO2 26  19 - 32 mEq/L   Glucose, Bld 82  70 - 99 mg/dL   BUN 17  6 - 23 mg/dL   Creatinine, Ser 1.30  0.50 - 1.35 mg/dL   Calcium 9.1  8.4 - 86.5 mg/dL   Total Protein 6.7  6.0 - 8.3 g/dL   Albumin 3.6  3.5 - 5.2 g/dL   AST 15  0 - 37 U/L   ALT 27  0 - 53 U/L   Alkaline Phosphatase 31 (*) 39 - 117 U/L   Total Bilirubin 0.4  0.3 - 1.2 mg/dL   GFR calc non Af Amer >90  >90 mL/min   GFR calc Af Amer >90  >90 mL/min   Comment:            The eGFR has been calculated     using the CKD EPI equation.     This calculation has not been     validated in all clinical     situations.     eGFR's persistently     <90 mL/min signify     possible Chronic Kidney Disease.    Physical Findings: COR: Regular rate but slow without RMBG. AIMS: Facial and Oral Movements Muscles of Facial Expression: None, normal Lips and Perioral Area: None, normal Jaw: None, normal Tongue: None, normal,Extremity Movements Upper (arms, wrists, hands, fingers): None, normal Lower (legs, knees, ankles, toes):  None, normal, Trunk Movements Neck, shoulders, hips: None, normal, Overall Severity Severity of abnormal movements (highest score from questions above): None, normal Incapacitation due to abnormal movements: None, normal Patient's awareness of abnormal movements (rate only patient's report):  No Awareness, Dental Status Current problems with teeth and/or dentures?: No Does patient usually wear dentures?: No  CIWA:  CIWA-Ar Total: 0 COWS:  COWS Total Score: 1  Treatment Plan Summary: Daily contact with patient to assess and evaluate symptoms and progress in treatment Medication management  Assessment: 1. 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 2. Oral intake is worsening. Plan:  1. I will discuss this issue again with IM MD and suggest that he be medically admitted for IV fluid rehydration and I&O monitoring. I spoke with Dr. Janee Morn who felt that the Zyprexa wouldn't be problem given that it likely could cause Tachycardia. He also felt that the patient should get up and walk to increase his heart rate. And finally if it seems that he is dehydrated that he could be taken to the ED for IV fluids if we felt that it was necessary.  Dr. Janee Morn will continue to monitor the patient and vital signs. 2. Had a pharmacy consult done for the use of antipsychotics primarily risperdal, haldol, and zyprexa. But am reluctant to start any medication due to the bradycardia and the risk of arrhythmia with anti psychotics, and his poor oral intake. Supportive approach/coping skills/stablization.. Will continue to monitor response to/adverse effects of medications in use to assure effectiveness. Continue to monitor mood, behavior and interaction with staff and other patients. Medical Decision Making Problem Points:  Established problem, worsening (1), Review of last therapy session (1) and Review of psycho-social  stressors (1) Data Points:  Review of medication regiment & side effects (2)  I certify that inpatient services furnished can reasonably be expected to improve the patient's condition.   Benjamin [name]. Sloan Galentine RPAC 09/19/2012, 12:32 PM

## 2012-09-19 NOTE — Progress Notes (Signed)
2300-0700 RN Note   D: Pt resting in bed, eyes closed, respirations even and unlabored.   A: Will continue to monitor. Continue Q15 minute safety checks as per unit protocol.  R: In no apparent distress. Shaindy Reader RN 

## 2012-09-19 NOTE — Progress Notes (Signed)
Psychoeducational Group Note  Date:  09/19/2012 Time:  2000  Group Topic/Focus:  Managing Feelings:   The focus of this group is to identify what feelings patients have difficulty handling and develop a plan to handle them in a healthier way upon discharge. Wrap-Up Group:   The focus of this group is to help patients review their daily goal of treatment and discuss progress on daily workbooks.  Participation Level: Did Not Attend  Participation Quality:  Not Applicable  Affect:  Not Applicable  Cognitive:  Not Applicable  Insight:  Not Applicable  Engagement in Group: Not Applicable  Additional Comments:  The patient rested in his bed during group.   Hazle Coca S 09/19/2012, 9:06 PM

## 2012-09-19 NOTE — Clinical Social Work Note (Signed)
[facility] Group Notes: (Clinical Social Work)   09/19/2012      Type of Therapy:  Group Therapy   Participation Level:  Did Not Attend    Ambrose Mantle, LCSW 09/19/2012, 12:06 PM

## 2012-09-19 NOTE — Progress Notes (Signed)
Patient ID: Benjamin [name], male   DOB: [birth year], 32 y.o.   MRN: [id number] Psychoeducational Group Note  Date:  09/19/2012 Time:  1000am  Group Topic/Focus:  Making Healthy Choices:   The focus of this group is to help patients identify negative/unhealthy choices they were using prior to admission and identify positive/healthier coping strategies to replace them upon discharge.  Participation Level:  Did Not Attend  Participation Quality:    Affect:   Cognitive:  Insight:   Engagement in Group:  Additional Comments:  Inventory group   [name] 09/19/2012,9:59 AM

## 2012-09-19 NOTE — Progress Notes (Addendum)
Patient ID: Benjamin [name], male   DOB: 1981/03/19, 32 y.o.   MRN: [id number]  Addendum:  I have spoken with Dr. Janee Morn who is monitoring the patient's vitals via EMR, and also Dr. Dan Humphreys to discuss options for treating this patient.  Durelle was unwilling to get up without assistance, but once seated on the side of the bed with legs down to floor, his heart rate did increase to 88 taken manually.  He was given juice with a straw and refused to take any oral liquids.  He has not been out of bed all day, and has not eaten or voided.  Discussed the situation again with Dr. Janee Morn who felt that this is all psychiatric and it is fine to give antipsychotic medications.  I spoke with Dr. Dan Humphreys who recommended Risperdone 0.5 IM, which is not available, so risperdal 0.5mg  M tab will be given.  Ativan 1 mg IM will be given as well.  Karam will be taken to the ED for hydration with IV fluids at [facility] when we have more staff available to accompany him.  Rona Ravens. Jamoni Broadfoot [facility] 09/19/2012  5:13 PM  09/19/2012 Patient was given 1mg  IM by RN with assistance to maintain patient safety. He was responsive and forceful but received the injection without incident. He would not take the 0.5 Risperdal M tab. He will be offered this again when the medication has had some effect.  Due to his current ability IV fluids are not warranted at this time. Will consider IM Zyprexa for psychosis if patient remains in the current state. Rona Ravens. Sumire Halbleib PAC

## 2012-09-20 ENCOUNTER — Other Ambulatory Visit: Payer: Self-pay

## 2012-09-20 ENCOUNTER — Encounter (HOSPITAL_COMMUNITY): Payer: Self-pay | Admitting: Emergency Medicine

## 2012-09-20 DIAGNOSIS — E860 Dehydration: Secondary | ICD-10-CM | POA: Insufficient documentation

## 2012-09-20 DIAGNOSIS — F5000 Anorexia nervosa, unspecified: Secondary | ICD-10-CM | POA: Insufficient documentation

## 2012-09-20 DIAGNOSIS — Z87891 Personal history of nicotine dependence: Secondary | ICD-10-CM | POA: Insufficient documentation

## 2012-09-20 DIAGNOSIS — I498 Other specified cardiac arrhythmias: Secondary | ICD-10-CM | POA: Insufficient documentation

## 2012-09-20 LAB — COMPREHENSIVE METABOLIC PANEL
BUN: 21 mg/dL (ref 6–23)
Calcium: 9.1 mg/dL (ref 8.4–10.5)
Creatinine, Ser: 0.75 mg/dL (ref 0.50–1.35)
GFR calc Af Amer: >90 mL/min (ref >90)
Glucose, Bld: 65 mg/dL — ABNORMAL LOW (ref 70–99)
Total Protein: 7.2 g/dL (ref 6.0–8.3)

## 2012-09-20 LAB — POCT I-STAT, CHEM 8
BUN: 26 mg/dL — ABNORMAL HIGH (ref 6–23)
Calcium, Ion: 1.11 mmol/L — ABNORMAL LOW (ref 1.12–1.23)
Creatinine, Ser: 1 mg/dL (ref 0.50–1.35)
TCO2: 25 mmol/L (ref 0–100)

## 2012-09-20 LAB — MAGNESIUM: Magnesium: 2.3 mg/dL (ref 1.5–2.5)

## 2012-09-20 MED ORDER — LORAZEPAM 2 MG/ML IJ SOLN
1.0000 mg | Freq: Once | INTRAMUSCULAR | Status: AC
  Administered 2012-09-20: 10:00:00 via INTRAMUSCULAR
  Filled 2012-09-20: qty 1

## 2012-09-20 MED ORDER — LORAZEPAM 1 MG PO TABS
1.0000 mg | ORAL_TABLET | Freq: Three times a day (TID) | ORAL | Status: DC | PRN
  Administered 2012-09-20: 1 mg via ORAL
  Filled 2012-09-20: qty 1

## 2012-09-20 MED ORDER — SODIUM CHLORIDE 0.9 % IV SOLN
1000.0000 mL | Freq: Once | INTRAVENOUS | Status: AC
  Administered 2012-09-20: 1000 mL via INTRAVENOUS

## 2012-09-20 MED ORDER — SODIUM CHLORIDE 0.9 % IV SOLN: 1000.0000 mL | INTRAVENOUS | Status: DC

## 2012-09-20 MED ORDER — SODIUM CHLORIDE 0.9 % IV SOLN: 1000.0000 mL | Freq: Once | INTRAVENOUS | Status: DC

## 2012-09-20 NOTE — Progress Notes (Signed)
PHARMACIST - PHYSICIAN COMMUNICATION DR:   Lynelle Doctor CONCERNING:  Consult for medication review for induced bradycardia  Pt seen in ED 2/2 for catatonia, tx to [facility] 2/4, now back in [facility] for concern for bradycardia, weightloss (weight 153-->145lb).   PMHx: HTN, herniated disc, MDD  HR: 49 (documented 40s-50s since 2/15)  Pt has been refusing all medications since 2/11. Only documented administrations were as follows:   2/16,17: Ativan1mg  IM x 1 2/16: Olanzapine 10mg  IM x1  Neither drug includes bradycardia as a specific ADE.    Per Lexi-comp, ADEs related to cardivascular/GI include:  Ativan:1-10%: hypotension, may cause changes in appetite, nausea Olanzapine: Somnolence (dose dependent), 1-10% CP, HTN, orthostatic hypotension  Please call with further questions or clarifications.    Thanks! Haynes Hoehn, PharmD 09/20/2012 12:39 PM  Pager: [phone]

## 2012-09-20 NOTE — ED Notes (Deleted)
Pt from [facility] here via ems .Staff stated that pt has not been eating or drinking and had low heart of 40s pt sent here  to be evaluated medically.Pysch Hx

## 2012-09-20 NOTE — ED Notes (Signed)
[id number]> Expected date:<BR> Expected time:<BR> Means of arrival:<BR> Comments:<BR> 31 yom bradycardia hypotension

## 2012-09-20 NOTE — ED Notes (Signed)
Patient given a total of 4 milks(patient states he drinks a lot of milk)and peanut butter and graham crackers.

## 2012-09-20 NOTE — ED Notes (Signed)
Awake talking to aunt on the phone

## 2012-09-20 NOTE — ED Provider Notes (Signed)
History     CSN: [id number]  Arrival date & time 09/20/12  4098   First MD Initiated Contact with Patient 09/20/12 [phone]      Chief Complaint  Patient presents with  . Bradycardia  . Medical Clearance   Level V caveat for catatonia  (Consider location/radiation/quality/duration/timing/severity/associated sxs/prior treatment) HPI  Patient was originally seen in the emergency department on February 2 for catatonia, he was transferred to behavior health on February 4. Evidently he has not been eating or drinking.It was felt he has lost some weight, however he has not been weighed. There was also some concern that his heart rate has been getting into the 40s. When I enter the room patient just stares ahead. He will not follow commands or verbally acknowledge anyone .  Past Medical History  Diagnosis Date  . Herniated disc     x3  . Hypertension   . Mental disorder   . Depression     Past Surgical History  Procedure Laterality Date  . Shoulder surgery      History reviewed. No pertinent family history.  History  Substance Use Topics  . Smoking status: Former Smoker -- 1.00 packs/day    Types: Cigarettes    Quit date: 04/04/2012  . Smokeless tobacco: Not on file  . Alcohol Use: No   Unable to obtain   Review of Systems  Unable to perform ROS: Psychiatric disorder    Allergies  Review of patient's allergies indicates no known allergies.  Home Medications  No current outpatient prescriptions on file.  BP 109/71  Pulse 49  Temp(Src) 97.9 F (36.6 C) (Axillary)  Resp 18  Ht 5' 8.25" (1.734 m)  Wt 145 lb (65.772 kg)  BMI 21.87 kg/m2  SpO2 98%  Vital signs normal except bradycardia   Physical Exam  Nursing note and vitals reviewed. Constitutional: He appears well-developed and well-nourished.  Non-toxic appearance. He does not appear ill. No distress.  HENT:  Head: Normocephalic and atraumatic.  Right Ear: External ear normal.  Left Ear: External ear  normal.  Nose: Nose normal. No mucosal edema or rhinorrhea.  Mouth/Throat: Mucous membranes are normal. No dental abscesses or edematous.  Patient refuses to open his mouth  Eyes: Conjunctivae and EOM are normal. Pupils are equal, round, and reactive to light.  Neck: Normal range of motion and full passive range of motion without pain. Neck supple.  Cardiovascular: Regular rhythm and normal heart sounds.  Bradycardia present.  Exam reveals no gallop and no friction rub.   No murmur heard. Pulmonary/Chest: Effort normal and breath sounds normal. No respiratory distress. He has no wheezes. He has no rhonchi. He has no rales. He exhibits no tenderness and no crepitus.  Abdominal: Soft. Normal appearance and bowel sounds are normal. He exhibits no distension. There is no tenderness. There is no rebound and no guarding.  Musculoskeletal: Normal range of motion. He exhibits no edema and no tenderness.  Moves all extremities well.   Neurological: He is alert. He has normal strength.  Patient will not cooperate for neuro exam. He does not answer any orientation questions  Skin: Skin is warm, dry and intact. No rash noted. No erythema. No pallor.  Psychiatric: His speech is normal. His mood appears not anxious.  Flat affect with minimal movement    ED Course  Procedures (including critical care time)  Medications  0.9 %  sodium chloride infusion (not administered)    Followed by  0.9 %  sodium chloride infusion (1,000  mLs Intravenous New Bag/Given 09/20/12 1127)    Followed by  0.9 %  sodium chloride infusion (not administered)  atiavan 1 mg IV   On the cardiac monitor patient heart rate has been in the mid to low 50s with some brief episodes were he drops into the high 40s. His blood pressure however has been stable.  Nurse tech reports his prior weight is was listed as 153 pounds, today he weighs 145 pounds.  Recheck 10:30 pt now moves his eyes to look around room, has some minor  movement  Recheck 12:35 pt alert, talking, has been eating. HR now 75, states he had been fasting for 30 days prior to coming to the ED on 2/2 and he had stopped eating again the past week. States he had ringing in his ears earlier today and couldn't hear anything. He is getting his second liter of IV fluids. I have asked the pharmacist to review his medications to make sure they are not causing the bradycardia.  He should be able to be transferred back to [facility].   Pharmacy reviewed patient's medications and it appears that none of his medications would cause bradycardia. Patient's bradycardia may have been a metabolic slowing due to his self-induced starvation. He has improved greatly with IV fluids. He is felt ready to go back to behavioral health to complete his psychiatric care.  I spent to Aggie,  psychiatric PA at 1354 she is accepting patient back to their facility for Dr Corky Crafts  COMPREHENSIVE METABOLIC PANEL      Result Value Range   Sodium 137  135 - 145 mEq/L   Potassium 4.4  3.5 - 5.1 mEq/L   Chloride 102  96 - 112 mEq/L   CO2 23  19 - 32 mEq/L   Glucose, Bld 65 (*) 70 - 99 mg/dL   BUN 21  6 - 23 mg/dL   Creatinine, Ser 1.61  0.50 - 1.35 mg/dL   Calcium 9.1  8.4 - 09.6 mg/dL   Total Protein 7.2  6.0 - 8.3 g/dL   Albumin 3.8  3.5 - 5.2 g/dL   AST 22  0 - 37 U/L   ALT 25  0 - 53 U/L   Alkaline Phosphatase 32 (*) 39 - 117 U/L   Total Bilirubin 0.6  0.3 - 1.2 mg/dL   GFR calc non Af Amer >90  >90 mL/min   GFR calc Af Amer >90  >90 mL/min  MAGNESIUM      Result Value Range   Magnesium 2.3  1.5 - 2.5 mg/dL  POCT I-STAT, CHEM 8      Result Value Range   Sodium 141  135 - 145 mEq/L   Potassium 4.4  3.5 - 5.1 mEq/L   Chloride 108  96 - 112 mEq/L   BUN 26 (*) 6 - 23 mg/dL   Creatinine, Ser 0.45  0.50 - 1.35 mg/dL   Glucose, Bld 71  70 - 99 mg/dL   Calcium, Ion 4.09 (*) 1.12 - 1.23 mmol/L   TCO2 25  0 - 100 mmol/L   Hemoglobin 16.0  13.0 - 17.0 g/dL   HCT 81.1  91.4 - 78.2 %    Laboratory interpretation all normal except low glucose     Date: 09/20/2012  Rate: 52  Rhythm: sinus bradycardia  QRS Axis: normal  Intervals: normal  ST/T Wave abnormalities: normal  Conduction Disutrbances:none  Narrative Interpretation:   Old EKG Reviewed: unchanged from 09/19/2011, but changed from 06/06/2012 when had  HR 95 and IRBBB     1. Major depressive disorder, recurrent episode, severe, specified as with psychotic behavior   2. History of substance abuse   3. Psychosis   4. Decreased oral intake   5. Bradycardia   6. Starvation     Plan return to [facility]  Devoria Albe, MD, FACEP   MDM          Ward Givens, MD 09/20/12 [phone]

## 2012-09-20 NOTE — Progress Notes (Addendum)
Patient ID: Benjamin [name], male   DOB: January 27, 1981, 32 y.o.   MRN: [id number] Patient returned from ED.  Patient is ambulatory; requested milk.  He reports some dizziness and ringing in the ears.  Patient received fluids and nutrition in the ED.  He received an injection of ativan for his catatonia.  Patient is cooperative and responsive to staff.  He is requesting another injection of ativan.  Spoke with NP and received order for ativan 1 mg po q 8 h prn.  Continue to assess patient and provide fluids and nutrition.  Patient is receptive to treatment. Patient given 1 mg ativan po at 1603.  1700: patient refused scheduled medications.  Refused ensure.

## 2012-09-20 NOTE — Progress Notes (Signed)
Patient ID: Benjamin [name], male   DOB: 05/19/1981, 32 y.o.   MRN: [id number] Patient continues to have low HR; this morning his apical pulse was 46.  His bp was 107/71.  Patient continues to refuse to eat or drink.  He appears frail and malnurished.  He is refusing his medications except for the forced medication injections.  From the nursing report this morning, there has been a concern over the weekend of his low heart rate and blood pressure.  Patient has been unwilling to get out of bed for an assessment; does not respond to staff in any manner.  He elects to be mute and unwilling to participate.  Spoke with Dr. Jannifer Franklin this morning and received order to immediately transfer patient to ED for evaluation due to low HR and patient refusing fluids.  Called [phone] nonemergent and EMS arrived to transport patient to ED.  Report called to [facility] charge nurse French Ana.

## 2012-09-20 NOTE — ED Notes (Signed)
Patient states he is unable to urinate at this time. 

## 2012-09-20 NOTE — ED Notes (Signed)
Patient weight in system prior to arrival said 153 lbs, I weighed patient and patient now weighs 145 lbs

## 2012-09-20 NOTE — ED Notes (Signed)
Talking eating and drinking

## 2012-09-20 NOTE — Progress Notes (Signed)
D: Pt remained in bed throughout shift.  No intake of food or fluids observed.  Pt remains electively mute and virtually nonresponsive.  Avoids eye contact with blank facial expression.  Pt shows no affect whatsoever and mood is apathetic.  Unable to assess speech or thought process or content due to elective mutism.  Pt is essentially totally resistant to care.  When presented with PO Zyprexa, Pt refused.  However, accepted Zyprexa 10mg  IM injection with resignation and no resistance @ 2042.  (Skin check performed during injection; skin on back and buttocks remains intact with no signs of breakdown.  However, with gown removed, Pt appears very thin.)  Pt continues to be bradycardic; VS @2115  99/58, HR 45, RR 16, T 97.73F.  MD on call notified of VS.  Manual radial pulse x1 minute @ 2400=48; @0105 =40.  Apical pulse taken x 1 minute @0115 , HR=40.  MD on call notified of HR @0120 .  Unable to assess Pt for SI/HI due to elective mutism; Pt has made no attempts to physically harm himself, was not admitted with SI.    A: Pt given much verbal support and encouragement from multiple staff members to take nourishment of choice as well as medication via PO route.  Refused scheduled  Nutritional supplement multiple times.  Observed frequently throughout shift by RN's to monitor HR; Q15 minute safety checks also maintained as per unit protocol.  Pt not observed to void on shift, appeared to be in same position in bed each time observed except for after moved for apical pulse. Pt is now MODERATE FALL RISK; all preventive measures in place.  R: Pt remains resistant to care, declining all food and liquids and ignoring ADLs.

## 2012-09-21 DIAGNOSIS — F940 Selective mutism: Secondary | ICD-10-CM | POA: Diagnosis not present

## 2012-09-21 MED ORDER — OLANZAPINE 10 MG IM SOLR
10.0000 mg | Freq: Two times a day (BID) | INTRAMUSCULAR | Status: DC
  Administered 2012-09-22 – 2012-09-27 (×11): 10 mg via INTRAMUSCULAR
  Filled 2012-09-21 (×15): qty 10

## 2012-09-21 MED ORDER — OLANZAPINE 10 MG IM SOLR
10.0000 mg | Freq: Two times a day (BID) | INTRAMUSCULAR | Status: DC
  Administered 2012-09-21: 10 mg via INTRAMUSCULAR
  Filled 2012-09-21 (×6): qty 10

## 2012-09-21 MED ORDER — LORAZEPAM 2 MG/ML IJ SOLN
1.0000 mg | Freq: Two times a day (BID) | INTRAMUSCULAR | Status: DC
  Filled 2012-09-21: qty 1

## 2012-09-21 MED ORDER — LORAZEPAM 1 MG PO TABS: 1.0000 mg | ORAL_TABLET | Freq: Two times a day (BID) | ORAL | Status: DC

## 2012-09-21 MED ORDER — LORAZEPAM 2 MG/ML IJ SOLN
1.0000 mg | Freq: Two times a day (BID) | INTRAMUSCULAR | Status: DC
Start: 2012-09-21 — End: 2012-09-21
  Administered 2012-09-21: 1 mg via INTRAMUSCULAR
  Filled 2012-09-21: qty 1

## 2012-09-21 MED ORDER — LORAZEPAM 1 MG PO TABS
1.0000 mg | ORAL_TABLET | Freq: Two times a day (BID) | ORAL | Status: DC
  Filled 2012-09-21: qty 1

## 2012-09-21 NOTE — Progress Notes (Addendum)
D:Patient in his room in bed and appears to be asleep.  Patient respirations even and unlabored and appears to be in no apparent distress.  Patient does start to arouse while writer is in the room but he does not Printmaker.  Patient did later get up out of bed and walk in the hallway.  Patient was awake and was seen sitting on the side of his bed.  Patient was asked by writer if he needed anything and patient did shake his head no.  Patient would not speak and patient got back in the bed.  Patient would not answer if he was having suicidal ideations, homicidal ideations or auditory/visual hallucinations.  A: Staff to monitor Q 15 mins for safety.  Encouragement and support offered.  No medications administered tonight.  Patients night time dose of Zyprexa was discontinued.  Patient refusing everything.   R: Patient remains safe on the unit.  Patient in bed and still remain catatonic.  Patient will not speak with staff.  Patient will not accept medications.  Patient did not have group tonight.

## 2012-09-21 NOTE — Progress Notes (Signed)
[facility] LCSW Aftercare Discharge Planning Group Note  09/21/2012 10:29 AM  Participation Quality:  Did not attend    Ida Rogue 09/21/2012, 10:29 AM

## 2012-09-21 NOTE — Progress Notes (Signed)
Patient ID: Crosley Stejskal, male   DOB: 05/03/1981, 32 y.o.   MRN: [id number] Patient lying in bed this am non responsive to staff.  He has his eyes closed and will not open his eyes to acknowledge that he is being spoken to.  He is refusing his medications by not responding to staff.  Patient has forced medication order with injectables.  He was given IM 10 mg zyprexa at 1045 without incident.  His po medications were cancelled due to refusals.  Patient is also refusing his ensures.  Cannot assess his SI/HI/AVH status due to unresponsiveness.    Continue to assess patient and monitor medication management.  Safety checks continued every 15 minutes per protocol.  Encourage and support patient as necessary.

## 2012-09-21 NOTE — Progress Notes (Signed)
[facility] MD Progress Note  09/21/2012 10:19 AM Benjamin [name]  MRN:  [id number] Subjective:  Patient lying in bed, staring at ceiling. Refuses to respond.  Chart review shows that he went to the ED for IV fluids and was given an injection of ativan. He was up talking to staff and responding to questions, was even able to use the phone to call his aunt.  Upon returning to the unit the patient was asking about his next ativan injection and when he would receive it. Today he is as noted. His vital signs are stable and his pulse is in the 70's. 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  Suicidal Ideation:  Patient does not respond Homicidal Ideation:  Patient does not respond AEB (as evidenced by): patient's behavior, affect, and verbal response.  Psychiatric Specialty Exam: Review of Systems  Unable to perform ROS: acuity of condition  Constitutional: Negative.  Negative for fever, chills, weight loss, malaise/fatigue and diaphoresis.  HENT: Negative for congestion and sore throat.   Eyes: Negative for blurred vision, double vision and photophobia.  Respiratory: Negative for cough, shortness of breath and wheezing.   Cardiovascular: Negative for chest pain, palpitations and PND.  Gastrointestinal: Negative for heartburn, nausea, vomiting, abdominal pain, diarrhea and constipation.  Musculoskeletal: Negative for myalgias, joint pain and falls.  Neurological: Negative for dizziness, tingling, tremors, sensory change, speech change, focal weakness, seizures, loss of consciousness, weakness and headaches.  Endo/Heme/Allergies: Negative for polydipsia. Does not bruise/bleed easily.  Psychiatric/Behavioral: Negative for depression, suicidal ideas, hallucinations, memory loss and substance abuse. The patient is not nervous/anxious and  does not have insomnia.     Blood pressure 118/74, pulse 78, temperature 97.9 F (36.6 C), temperature source Axillary, resp. rate 18, height 5' 8.25" (1.734 m), weight 65.772 kg (145 lb), SpO2 100.00%.Body mass index is 21.87 kg/(m^2).  General Appearance: Disheveled and laying in bed with blank stare refuses to speak.  Eye Contact::  None  Speech:  none  Volume:  none  Mood:  flat  Affect:  Flat  Thought Process:  Negative  Orientation:  Negative  Thought Content:  Negative  Suicidal Thoughts:  Unable to assess  Homicidal Thoughts:  Unable to assess  Memory:  Unable to assess  Judgement:  Impaired  Insight:  Lacking  Psychomotor Activity:  Decreased  Concentration:  unable to assess  Recall:  NA  Akathisia:  No  Handed:  Right  AIMS (if indicated):     Assets:    Sleep:  Number of Hours: 6.75   Current Medications: Current Facility-Administered Medications  Medication Dose Route Frequency Provider Last Rate Last Dose  . acetaminophen (TYLENOL) tablet 650 mg  650 mg Oral Q6H PRN Kerry Hough, PA      . alum & mag hydroxide-simeth (MAALOX/MYLANTA) 200-200-20 MG/5ML suspension 30 mL  30 mL Oral Q4H PRN Kerry Hough, PA      . benztropine (COGENTIN) tablet 1 mg  1 mg Oral BID Mojeed Akintayo   1 mg at 09/14/12 1639  . citalopram (CELEXA) tablet 20 mg  20 mg Oral Daily Mojeed Akintayo   20 mg at 09/14/12 0834  . feeding supplement (ENSURE COMPLETE) liquid 237 mL  237 mL Oral TID WC & HS  Jeoffrey Massed, RD      . LORazepam (ATIVAN) tablet 1 mg  1 mg Oral Q8H PRN Sanjuana Kava, NP   1 mg at 09/20/12 1603  . magnesium hydroxide (MILK OF MAGNESIA) suspension 30 mL  30 mL Oral Daily PRN Kerry Hough, PA      . multivitamin with minerals tablet 1 tablet  1 tablet Oral Daily Lavena Bullion, RD   1 tablet at 09/14/12 0834  . risperiDONE (RISPERDAL M-TABS) disintegrating tablet 0.5 mg  0.5 mg Oral Daily Verne Spurr, PA-C      . traZODone (DESYREL) tablet 50 mg  50 mg Oral QHS  PRN Mojeed Akintayo        Lab Results:  Results for orders placed during the hospital encounter of 09/07/12 (from the past 48 hour(s))  COMPREHENSIVE METABOLIC PANEL     Status: Abnormal   Collection Time    09/20/12  9:55 AM      Result Value Range   Sodium 137  135 - 145 mEq/L   Potassium 4.4  3.5 - 5.1 mEq/L   Comment: SLIGHT HEMOLYSIS     HEMOLYSIS AT THIS LEVEL MAY AFFECT RESULT   Chloride 102  96 - 112 mEq/L   CO2 23  19 - 32 mEq/L   Glucose, Bld 65 (*) 70 - 99 mg/dL   BUN 21  6 - 23 mg/dL   Creatinine, Ser 1.19  0.50 - 1.35 mg/dL   Calcium 9.1  8.4 - 14.7 mg/dL   Total Protein 7.2  6.0 - 8.3 g/dL   Albumin 3.8  3.5 - 5.2 g/dL   AST 22  0 - 37 U/L   ALT 25  0 - 53 U/L   Alkaline Phosphatase 32 (*) 39 - 117 U/L   Total Bilirubin 0.6  0.3 - 1.2 mg/dL   GFR calc non Af Amer >90  >90 mL/min   GFR calc Af Amer >90  >90 mL/min   Comment:            The eGFR has been calculated     using the CKD EPI equation.     This calculation has not been     validated in all clinical     situations.     eGFR's persistently     <90 mL/min signify     possible Chronic Kidney Disease.  MAGNESIUM     Status: None   Collection Time    09/20/12  9:55 AM      Result Value Range   Magnesium 2.3  1.5 - 2.5 mg/dL  POCT I-STAT, CHEM 8     Status: Abnormal   Collection Time    09/20/12 10:13 AM      Result Value Range   Sodium 141  135 - 145 mEq/L   Potassium 4.4  3.5 - 5.1 mEq/L   Chloride 108  96 - 112 mEq/L   BUN 26 (*) 6 - 23 mg/dL   Creatinine, Ser 8.29  0.50 - 1.35 mg/dL   Glucose, Bld 71  70 - 99 mg/dL   Calcium, Ion 5.62 (*) 1.12 - 1.23 mmol/L   TCO2 25  0 - 100 mmol/L   Hemoglobin 16.0  13.0 - 17.0 g/dL   HCT 13.0  86.5 - 78.4 %    Physical Findings: AIMS: Facial and Oral Movements Muscles of Facial Expression: None, normal Lips and Perioral Area: None, normal Jaw: None, normal Tongue: None, normal,Extremity Movements Upper (arms, wrists,  hands, fingers): None,  normal Lower (legs, knees, ankles, toes): None, normal, Trunk Movements Neck, shoulders, hips: None, normal, Overall Severity Severity of abnormal movements (highest score from questions above): None, normal Incapacitation due to abnormal movements: None, normal Patient's awareness of abnormal movements (rate only patient's report): No Awareness, Dental Status Current problems with teeth and/or dentures?: No Does patient usually wear dentures?: No  CIWA:  CIWA-Ar Total: 0 COWS:  COWS Total Score: 1  Treatment Plan Summary: Daily contact with patient to assess and evaluate symptoms and progress in treatment Medication management  Plan: 1. All medications have been reviewed. 2. Will give Zyprexa 10mg  IM BID for psychosis. 3. Will give Ativan 1mg  IM BID for psychosis. 4. Will continue to offer food and liquids, nutritional supplements including Ensure. 5. Will assess for a higher level of care if continues to decline. 6. Will continue to monitor vitals.  Medical Decision Making Problem Points:  Established problem, stable/improving (1) Data Points:  Review or order medicine tests (1) Review and summation of old records (2)  I certify that inpatient services furnished can reasonably be expected to improve the patient's condition.    Rona Ravens. Judah Carchi [facility] 09/21/2012 11:24 AM

## 2012-09-22 NOTE — Progress Notes (Signed)
Psychoeducational Group Note  Date:  09/22/2012 Time:  2000  Group Topic/Focus:  Wrap-Up Group:   The focus of this group is to help patients review their daily goal of treatment and discuss progress on daily workbooks.  Participation Level: Did Not Attend  Participation Quality:  Not Applicable  Affect:  Not Applicable  Cognitive:  Not Applicable  Insight:  Not Applicable  Engagement in Group: Not Applicable  Additional Comments:  Pt did not attend group, still not responding to staff.  Christ Kick 09/22/2012, 9:00 PM

## 2012-09-22 NOTE — Progress Notes (Signed)
Patient is electively being mute with the staff and peers and he responds if you ask him to open his eyes; patient declined self inventory  Patient has been offered fluids and food repeatedly; patient was administered intramuscular injection per physician order; patient encouraged to participate with staff in order to help with treatment  Patient has only responded by opening his eyes on commands; patient has refused all oral medications; patient uncooperative with treatment at this time; nurse will continue to monitor intake and output and has made PA aware of concern

## 2012-09-22 NOTE — Progress Notes (Addendum)
D: Patient in bed resting with eyes closed on first approach.  Writer called patients named and patient opened his eyes a looked a Clinical research associate.  Writer explained the medications patient would be getting tonight.  Patient closed his eyes. After writer administered Zyprexa 10mg  IM patient got out of bed and walked to his trash can and pulled out an empty carton of milk to show Clinical research associate.  Patient was smiling at the time so writer asked patient if there was anything else he wanted and patient pointed to graham crackers and peanut butter.  When writer can back to patients room with his snack patient was sitting up in bed smiling but remained mute but would shake his head yes and no to questions about food.  Patient drank two cartons on milk at this time and patient ate 3 packs of of crackers and two small containers of peanut butter.  Patient smiled and Clinical research associate and did make some sound but did not articulate words.   Blood pressure had to be taken manually around 2200.  B/P lying 84/48 heartrate 56.  Spencer PA notified.  Patient was given an additional carton of milk to drink and Clinical research associate offered Gatorade to patient but patient refused.  Patient would not respond when RN asked him if he had SI/HI or AVH.    A: Staff to monitor Q 15 mins for safety.  Encouragement and support offered.  Scheduled Zyprexa administered per orders.  Ativan not administered per [facility] PA patient blood pressures low along with his heart rate.  Blood pressure had to be taken manually.  B/P lying 84/48 heart rate 56.  Patient refused ensure but would drink milk.  Patient encouraged to notify staff with concerns.  R: Patient remains safe on the unit.  Patient did not attend group.  Patient remains nonverbal/mute, blunted, depressed. Patient  Does smile occasionally but will not speak.  Patient followed commands of the nurse.  Patient turned over for his IM injection as well as he sat up in bed when RN asked him to to drink his milk.  RN/Writer has not  seen patient get up to use the rest room will continue to observe and monitor.

## 2012-09-22 NOTE — Progress Notes (Signed)
Patient ID: Benjamin [name], male   DOB: 1980-12-31, 32 y.o.   MRN: [id number] [facility] MD Progress Note  09/22/2012 10:39 AM Mateo Overbeck  MRN:  [id number] Subjective: Patient remains withdrawn in his room, refusing to interact with staff and peers. He refuses to answer questions, he is oppositional and selectively mute. However, it was reported that when patient was transported to the emergency room on Monday for treatment of bradycardia he was talking, joking with people and making phone calls. His vital signs are stable and his pulse is in the 70's.  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  Sleep: Good  Appetite:  Poor patient continues to have poor oral intake.  Suicidal Ideation:  Patient does not respond Homicidal Ideation:  Patient does not respond AEB (as evidenced by): patient's behavior, affect, and verbal response.  Psychiatric Specialty Exam: Review of Systems  Unable to perform ROS: acuity of condition  Constitutional: Negative.  Negative for fever, chills, weight loss, malaise/fatigue and diaphoresis.  HENT: Negative for congestion and sore throat.   Eyes: Negative for blurred vision, double vision and photophobia.  Respiratory: Negative for cough, shortness of breath and wheezing.   Cardiovascular: Negative for chest pain, palpitations and PND.  Gastrointestinal: Negative for heartburn, nausea, vomiting, abdominal pain, diarrhea and constipation.  Musculoskeletal: Negative for myalgias, joint pain and falls.  Neurological: Negative for dizziness, tingling, tremors, sensory change, speech change, focal weakness, seizures, loss of consciousness, weakness and headaches.  Endo/Heme/Allergies: Negative for polydipsia. Does not bruise/bleed easily.  Psychiatric/Behavioral: Negative for depression, suicidal ideas, hallucinations, memory loss and substance abuse. The  patient is not nervous/anxious and does not have insomnia.     Blood pressure 104/68, pulse 84, temperature 97.6 F (36.4 C), temperature source Oral, resp. rate 18, height 5' 8.25" (1.734 m), weight 65.772 kg (145 lb), SpO2 100.00%.Body mass index is 21.87 kg/(m^2).  General Appearance: Disheveled and laying in bed with blank stare refuses to speak.  Eye Contact::  None  Speech:  none  Volume:  none  Mood:  flat  Affect:  Flat  Thought Process:  Negative  Orientation:  Negative  Thought Content:  Negative  Suicidal Thoughts:  Unable to assess  Homicidal Thoughts:  Unable to assess  Memory:  Unable to assess  Judgement:  Impaired  Insight:  Lacking  Psychomotor Activity:  Decreased  Concentration:  unable to assess  Recall:  NA  Akathisia:  No  Handed:  Right  AIMS (if indicated):     Assets:    Sleep:  Number of Hours: 6.75   Current Medications: Current Facility-Administered Medications  Medication Dose Route Frequency Provider Last Rate Last Dose  . acetaminophen (TYLENOL) tablet 650 mg  650 mg Oral Q6H PRN Kerry Hough, PA      . alum & mag hydroxide-simeth (MAALOX/MYLANTA) 200-200-20 MG/5ML suspension 30 mL  30 mL Oral Q4H PRN Kerry Hough, PA      . benztropine (COGENTIN) tablet 1 mg  1 mg Oral BID Adreanne Yono   1 mg at 09/14/12 1639  . feeding supplement (ENSURE COMPLETE) liquid 237 mL  237 mL Oral TID WC & HS Jeoffrey Massed, RD      . LORazepam (ATIVAN) tablet 1 mg  1 mg Oral Q12H Verne Spurr, PA-C  Or  . LORazepam (ATIVAN) injection 1 mg  1 mg Intramuscular Q12H Verne Spurr, PA-C      . magnesium hydroxide (MILK OF MAGNESIA) suspension 30 mL  30 mL Oral Daily PRN Kerry Hough, PA      . multivitamin with minerals tablet 1 tablet  1 tablet Oral Daily Lavena Bullion, RD   1 tablet at 09/14/12 0834  . OLANZapine (ZYPREXA) injection 10 mg  10 mg Intramuscular BID Verne Spurr, PA-C   10 mg at 09/22/12 0905  . traZODone (DESYREL) tablet 50 mg  50 mg  Oral QHS PRN Raelene Trew        Lab Results:  No results found for this or any previous visit (from the past 48 hour(s)).  Physical Findings: AIMS: Facial and Oral Movements Muscles of Facial Expression: None, normal Lips and Perioral Area: None, normal Jaw: None, normal Tongue: None, normal,Extremity Movements Upper (arms, wrists, hands, fingers): None, normal Lower (legs, knees, ankles, toes): None, normal, Trunk Movements Neck, shoulders, hips: None, normal, Overall Severity Severity of abnormal movements (highest score from questions above): None, normal Incapacitation due to abnormal movements: None, normal Patient's awareness of abnormal movements (rate only patient's report): No Awareness, Dental Status Current problems with teeth and/or dentures?: No Does patient usually wear dentures?: No  CIWA:  CIWA-Ar Total: 0 COWS:  COWS Total Score: 1  Treatment Plan Summary: Daily contact with patient to assess and evaluate symptoms and progress in treatment Medication management  Plan: 1. All medications have been reviewed. 2. Will continue Zyprexa 10mg  PO/ IM BID for psychosis. 3. Will continue Ativan 1mg  PO/ IM BID . 4. Will continue to offer food and liquids, nutritional supplements including Ensure. 5. Will assess for a higher level of care if continues to decline. 6. Will continue to monitor vitals.  Medical Decision Making Problem Points:  Established problem, stable/improving (1) Data Points:  Review or order medicine tests (1) Review and summation of old records (2)  I certify that inpatient services furnished can reasonably be expected to improve the patient's condition.    Shirlette Scarber,MD 09/22/2012 10:39 AM

## 2012-09-22 NOTE — Progress Notes (Signed)
[facility] LCSW Aftercare Discharge Planning Group Note  09/22/2012 11:37 AM  Participation Quality:  Did not attend   Ida Rogue 09/22/2012, 11:37 AM

## 2012-09-22 NOTE — Clinical Social Work Note (Signed)
[facility] Group Notes:  (Counselor/Nursing/MHT/Case Management/Adjunct)  06/18/2012 12:00 PM  Type of Therapy:  Group Therapy  Participation Level:  Did Not Attend  Smart, Heather N 06/18/2012, 12:00 PM  

## 2012-09-22 NOTE — Treatment Plan (Signed)
  Interdisciplinary Treatment Plan Update   Date Reviewed:  09/22/2012  Time Reviewed:  10:00 AM  Progress in Treatment:   Attending groups: No Participating in groups: No Taking medication as prescribed: No,  Forced meds Tolerating medication: Yes Family/Significant other contact made: Yes  Patient understands diagnosis: Yes    Discussing patient identified problems/goals with staff: Yes Medical problems stabilized or resolved:  Denies suicidal/homicidal ideation: No  Selective mutism   Patient has not harmed self or others: Yes  For review of initial/current patient goals, please see plan of care.  Estimated Length of Stay:  2-3 days  Reason for Continuation of Hospitalization: Medication stabilization  New Problems/Goals identified:  N/A  Discharge Plan or Barriers:   Unknown  Additional Comments:  Shawndell was sent to the ED on Monday due to lack of nutritional intake.  He responded positively there; interacting, eating, drinking and making a phone call.  He returned and is once again staying in bed and selectively mute.  Attendees:  Signature: Thedore Mins, MD 09/22/2012 10:00 AM   Signature: Richelle Ito, LCSW 09/22/2012 10:00 AM  Signature: Verne Spurr, PA 09/22/2012 10:00 AM  Signature: Nestor Ramp, RN 09/22/2012 10:00 AM  Signature: Leighton Parody 09/22/2012 10:00 AM  Signature:  09/22/2012 10:00 AM  Signature:   09/22/2012 10:00 AM  Signature:    Signature:    Signature:    Signature:    Signature:    Signature:      Scribe for Treatment Team:   Richelle Ito, LCSW  09/22/2012 10:00 AM

## 2012-09-22 NOTE — Progress Notes (Signed)
[facility] ED CM provided a referral to [facility] for community care liaison , Benjamin [name]

## 2012-09-22 NOTE — Progress Notes (Signed)
Recreation Therapy Notes  Date: 02.19.2014  Time: 9:30am      Group Topic/Focus: Leisure Education  Participation Level: Did not attend   Znya Albino L Christinamarie Tall, LRT/CTRS 

## 2012-09-22 NOTE — Progress Notes (Signed)
Nutrition Follow-up  Diet Regular with Ensure Complete four times daily.    Patient is oppositional and selectively mute.  Talked and joked during recent ER visit Monday.  Patient opened eyes but would not speak to me today.  Continues to refuse PO intake except for occasional milk.  Patient was on a 29 day fast without food and 8.5 days without water prior to admit.  Patient has now been admitted for 15 days with minimal intake.  Wt:  09/07/12=153 lbs.   Wt Readings from Last 10 Encounters:  09/20/12 145 lb (65.772 kg)  12/14/11 179 lb (81.194 kg)   5% weight loss in the past 2 weeks, 19% weight loss in the past 9 months.  Patient remains severely malnourished.  CMP     Component Value Date/Time   NA 141 09/20/2012 1013   K 4.4 09/20/2012 1013   CL 108 09/20/2012 1013   CO2 23 09/20/2012 0955   GLUCOSE 71 09/20/2012 1013   BUN 26* 09/20/2012 1013   CREATININE 1.00 09/20/2012 1013   CREATININE 0.80 12/14/2011 1306   CALCIUM 9.1 09/20/2012 0955   PROT 7.2 09/20/2012 0955   ALBUMIN 3.8 09/20/2012 0955   AST 22 09/20/2012 0955   ALT 25 09/20/2012 0955   ALKPHOS 32* 09/20/2012 0955   BILITOT 0.6 09/20/2012 0955   GFRNONAA >90 09/20/2012 0955   GFRAA >90 09/20/2012 0955    Will continue to monitor.  Please consult if more aggressive treatment warranted.  Oran Rein, RD, LDN Clinical Inpatient Dietitian Pager:  [phone] Weekend and after hours pager:  [phone]

## 2012-09-23 NOTE — Progress Notes (Signed)
Patient refused labs this am.  Patient kept jerking his arm away from the phlebotomist.  Benjamin [name] notified and Labs rescheduled for tomorrow 09/24/12 in the AM.

## 2012-09-23 NOTE — Progress Notes (Signed)
Patient ID: Benjamin [name], male   DOB: August 18, 1980, 32 y.o.   MRN: [id number] Patient continues to be electively mute; does not respond to staff.  He has come up to staff pointing to packets of peanut butter.  He takes the packets of peanut butter out of his scrubs to show that he wants some more.  He has been eating peanut butter and graham crackers today.  He has had one injection of zyprexa 10 mg im.  The ativan im has been discontinued; he continues to refuse po medications and labs.  Discharge planning is for Monday morning.  Continue to assess and maintain safety.

## 2012-09-23 NOTE — Progress Notes (Signed)
Dustin MHTnotifiedwriter that patient came out of his room and pointed to a carton of milk so dustin MHT gave patient a carton of milk.  When writer went in patient's room around 2200 patient had an empty carton of milk on his nightstand.

## 2012-09-23 NOTE — Progress Notes (Signed)
Patient ID: Benjamin [name], male   DOB: 1981/04/12, 32 y.o.   MRN: [id number]  D: Pt denies SI/HI/AVH. 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 Pt communicates by having empty containers of what he wants. Pt behavior appears to be selective. Pt just nods head when asking questions.   A: Pt was offered support and encouragement. Pt was given scheduled medications. Pt was encourage to attend groups. Q 15 minute checks were done for safety.   R:. Pt is taking medication .Pt receptive to treatment and safety maintained on unit.

## 2012-09-23 NOTE — Progress Notes (Signed)
Psychoeducational Group Note  Date:  09/23/2012 Time:  0930  Group Topic/Focus:  Self Esteem Action Plan:   The focus of this group is to help patients create a plan to continue to build self-esteem after discharge.  Participation Level: Did Not Attend  Participation Quality:  Not Applicable  Affect:  Not Applicable  Cognitive:  Not Applicable  Insight:  Not Applicable  Engagement in Group: Not Applicable  Additional Comments:  Pt did not respond when prompted to attend group.  Dasie Chancellor E 09/23/2012, 11:29 AM

## 2012-09-23 NOTE — Clinical Social Work Note (Signed)
Spoke to brother about lack of progress.  He expressed concern, but is not able to be helpful to the pt.  He lives in [place] and alluded to the fact that he is having to do some degree of care taking for pt's mother as she has not been doing well.  He wants [place] [facility], which I assured him we had done, but also explained that he has only been on the wait list for 4 days, which means at least another 28 days with [place], which the Dr is not willing to do, given that some of his behaviors appear to be volitional.  His only other suggestion was to contact the mother of pt's children-will discuss in tx team.

## 2012-09-23 NOTE — Progress Notes (Signed)
Patient ID: Benjamin [name], male   DOB: 1981-06-10, 32 y.o.   MRN: [id number] [facility] MD Progress Note  09/23/2012 4:26 PM Benjamin [name]  MRN:  [id number] Subjective: Patient remains withdrawn in his room, refusing to interact with staff and peers. He refuses to answer questions, he is oppositional and selectively mute. He is still able to eat and drink and gestures to graham crackers and milk, he holds on to a peanut butter packet today and demonstrates intentional and spontaneous movement. He will not make eye contact.  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  Sleep: Good  Appetite:  Poor patient continues to have poor oral intake.  Suicidal Ideation:  Patient does not respond Homicidal Ideation:  Patient does not respond AEB (as evidenced by): patient's behavior, affect, and verbal response.  Psychiatric Specialty Exam: Review of Systems  Unable to perform ROS: acuity of condition  Constitutional: Negative.  Negative for fever, chills, weight loss, malaise/fatigue and diaphoresis.  HENT: Negative for congestion and sore throat.   Eyes: Negative for blurred vision, double vision and photophobia.  Respiratory: Negative for cough, shortness of breath and wheezing.   Cardiovascular: Negative for chest pain, palpitations and PND.  Gastrointestinal: Negative for heartburn, nausea, vomiting, abdominal pain, diarrhea and constipation.  Musculoskeletal: Negative for myalgias, joint pain and falls.  Neurological: Negative for dizziness, tingling, tremors, sensory change, speech change, focal weakness, seizures, loss of consciousness, weakness and headaches.  Endo/Heme/Allergies: Negative for polydipsia. Does not bruise/bleed easily.  Psychiatric/Behavioral: Negative for depression, suicidal ideas, hallucinations, memory loss and substance abuse. The patient is not nervous/anxious  and does not have insomnia.     Blood pressure 99/60, pulse 52, temperature 97.3 F (36.3 C), temperature source Oral, resp. rate 18, height 5' 8.25" (1.734 m), weight 65.772 kg (145 lb), SpO2 100.00%.Body mass index is 21.87 kg/(m^2).  General Appearance: Disheveled and laying in bed with blank stare refuses to speak.  Eye Contact::  None  Speech:  none  Volume:  none  Mood:  flat  Affect:  Flat  Thought Process:  Negative  Orientation:  Negative  Thought Content:  Negative  Suicidal Thoughts:  Unable to assess  Homicidal Thoughts:  Unable to assess  Memory:  Unable to assess  Judgement:  Impaired  Insight:  Lacking  Psychomotor Activity:  Spontaneous and intentional today.  Concentration:  unable to assess  Recall:  NA  Akathisia:  No  Handed:  Right  AIMS (if indicated):     Assets:    Sleep:  Number of Hours: 6.75   Current Medications: Current Facility-Administered Medications  Medication Dose Route Frequency Provider Last Rate Last Dose  . acetaminophen (TYLENOL) tablet 650 mg  650 mg Oral Q6H PRN Kerry Hough, PA      . alum & mag hydroxide-simeth (MAALOX/MYLANTA) 200-200-20 MG/5ML suspension 30 mL  30 mL Oral Q4H PRN Kerry Hough, PA      . benztropine (COGENTIN) tablet 1 mg  1 mg Oral BID Mojeed Akintayo   1 mg at 09/14/12 1639  . feeding supplement (ENSURE COMPLETE) liquid 237 mL  237 mL Oral TID WC & HS Jeoffrey Massed, RD      . magnesium hydroxide (MILK OF MAGNESIA) suspension 30 mL  30 mL Oral Daily PRN Kerry Hough, PA      .  multivitamin with minerals tablet 1 tablet  1 tablet Oral Daily Lavena Bullion, RD   1 tablet at 09/14/12 0834  . OLANZapine (ZYPREXA) injection 10 mg  10 mg Intramuscular BID Verne Spurr, PA-C   10 mg at 09/23/12 1610  . traZODone (DESYREL) tablet 50 mg  50 mg Oral QHS PRN Mojeed Akintayo        Lab Results:  No results found for this or any previous visit (from the past 48 hour(s)).  Physical Findings: AIMS: Facial and Oral  Movements Muscles of Facial Expression: None, normal Lips and Perioral Area: None, normal Jaw: None, normal Tongue: None, normal,Extremity Movements Upper (arms, wrists, hands, fingers): None, normal Lower (legs, knees, ankles, toes): None, normal, Trunk Movements Neck, shoulders, hips: None, normal, Overall Severity Severity of abnormal movements (highest score from questions above): None, normal Incapacitation due to abnormal movements: None, normal Patient's awareness of abnormal movements (rate only patient's report): No Awareness, Dental Status Current problems with teeth and/or dentures?: No Does patient usually wear dentures?: No  CIWA:  CIWA-Ar Total: 0 COWS:  COWS Total Score: 1  Treatment Plan Summary: Daily contact with patient to assess and evaluate symptoms and progress in treatment Medication management  Plan: 1. All medications have been reviewed. 2. Will continue Zyprexa 10mg  PO/ IM BID for psychosis. 3. Will discontinue Ativan 1mg  PO/ IM BID . 4. Will continue to offer food and liquids, nutritional supplements including Ensure. 5. Will follow improvement with a goal of discharge. 6. Will continue to monitor vitals.  Medical Decision Making Problem Points:  Established problem, stable/improving (1) Data Points:  Review or order medicine tests (1) Review and summation of old records (2)  I certify that inpatient services furnished can reasonably be expected to improve the patient's condition.   Rona Ravens. Iktan Aikman RPAC 4:29 PM 09/23/2012

## 2012-09-23 NOTE — Progress Notes (Signed)
[facility] LCSW Aftercare Discharge Planning Group Note  09/23/2012 2:19 PM  Participation Quality:  Did not attend    Ida Rogue 09/23/2012, 2:19 PM

## 2012-09-23 NOTE — Clinical Social Work Note (Signed)
[facility] Group Notes:  (Counselor/Nursing/MHT/Case Management/Adjunct)  06/17/2012 2:20 PM  Type of Therapy:  Group Therapy  Participation Level:  Did Not Attend    Summary of Progress/Problems: .balance: The topic for group was balance in life.  Pt participated in the discussion about when their life was in balance and out of balance and how this feels.  Pt discussed ways to get back in balance and short term goals they can work on to get where they want to be.    Vivianne Carles B 06/17/2012, 2:20 PM  

## 2012-09-24 NOTE — Progress Notes (Signed)
[facility] LCSW Aftercare Discharge Planning Group Note  09/24/2012 9:14 AM  Participation Quality:  Invited, but did not attend    Ida Rogue 09/24/2012, 9:14 AM

## 2012-09-24 NOTE — Progress Notes (Signed)
D) Pt's affect is flat and has poor eye contact. Pt did allow this writer to sit on his bed and moved over so this writer could be comfortable. Pt then grabbed a peanut butter cup and pointed to it. Never said anything but was pointing to the peanut butter cup and indicated he wanted more. He then stood up and walked with this Clinical research associate to the snack room. Pt took some milk and several peanut butter cups back to his room with him. At one point Pt did grab unto this writers arm to steady himself. A) Given support. Talking with Pt in a quiet, gentle voice and giving much encouragement. Different attempts made at engaging Pt to talk without success. R) Pt did respond minimally to this writer, without words, but got his point across.

## 2012-09-24 NOTE — Clinical Social Work Note (Signed)
[facility] LCSW Group Therapy  09/24/2012 2:13 PM   Type of Therapy:  Group Therapy  Participation Level:  Did not attend      Summary of Progress/Problems: Today's group focused on relapse prevention.  We defined the term, and then brainstormed on ways to prevent relapse.  Daryel Gerald B 09/24/2012 , 2:13 PM

## 2012-09-24 NOTE — Progress Notes (Signed)
Recreation Therapy Notes  09/24/2012  Time: 9:30am   Group Topic/Focus: Self Expression   Participation Level:  Did not attend    Jearl Klinefelter, LRT/CTRS

## 2012-09-24 NOTE — Progress Notes (Signed)
D-Patient went down to eat dinner this evening. He continues to refuse the speak with staff. Patient will attempt to respond to some questions using his nonverbal communication.  A-Patient encouraged to speak with Clinical research associate. Patient did come to medication window when asked but went back to room before medication could be given. He then refused it when writer brought to his room. Patient did allow zyprexa injection to be given this evening without resistance. R-Will attempt to engage patient in his treatment at every opportunity.

## 2012-09-24 NOTE — Progress Notes (Signed)
Patient ID: Benjamin [name], male   DOB: Nov 01, 1980, 32 y.o.   MRN: [id number] [facility] MD Progress Note  09/24/2012 9:39 AM Benjamin [name]  MRN:  [id number] Subjective: Patient remains withdrawn in his room, refusing to interact with staff and peers. He refuses to answer questions, he is oppositional and selectively mute. He sleeps well, able to eat but has refused self care and grooming. I am unable to determine the patient current symptoms as he only answer questions by shrugging his shoulders. He has not report any adverse reactions to his current medications.  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  Sleep: Good  Appetite:  Poor patient continues to have poor oral intake.  Suicidal Ideation:  Patient does not respond Homicidal Ideation:  Patient does not respond AEB (as evidenced by): patient's behavior, affect, and verbal response.  Psychiatric Specialty Exam: Review of Systems  Unable to perform ROS: acuity of condition  Constitutional: Negative.  Negative for fever, chills, weight loss, malaise/fatigue and diaphoresis.  HENT: Negative for congestion and sore throat.   Eyes: Negative for blurred vision, double vision and photophobia.  Respiratory: Negative for cough, shortness of breath and wheezing.   Cardiovascular: Negative for chest pain, palpitations and PND.  Gastrointestinal: Negative for heartburn, nausea, vomiting, abdominal pain, diarrhea and constipation.  Musculoskeletal: Negative for myalgias, joint pain and falls.  Neurological: Negative for dizziness, tingling, tremors, sensory change, speech change, focal weakness, seizures, loss of consciousness, weakness and headaches.  Endo/Heme/Allergies: Negative for polydipsia. Does not bruise/bleed easily.  Psychiatric/Behavioral: Negative for depression, suicidal ideas, hallucinations, memory loss and substance  abuse. The patient is not nervous/anxious and does not have insomnia.     Blood pressure 116/74, pulse 86, temperature 98 F (36.7 C), temperature source Oral, resp. rate 16, height 5' 8.25" (1.734 m), weight 65.772 kg (145 lb), SpO2 100.00%.Body mass index is 21.87 kg/(m^2).  General Appearance: Disheveled and laying in bed with blank stare refuses to speak.  Eye Contact::  None  Speech:  none  Volume:  none  Mood:  flat  Affect:  Flat  Thought Process:  Negative  Orientation:  Negative  Thought Content:  Negative  Suicidal Thoughts:  Unable to assess  Homicidal Thoughts:  Unable to assess  Memory:  Unable to assess  Judgement:  Impaired  Insight:  Lacking  Psychomotor Activity:  Spontaneous and intentional today.  Concentration:  unable to assess  Recall:  NA  Akathisia:  No  Handed:  Right  AIMS (if indicated):     Assets:    Sleep:  Number of Hours: 6.75   Current Medications: Current Facility-Administered Medications  Medication Dose Route Frequency Provider Last Rate Last Dose  . acetaminophen (TYLENOL) tablet 650 mg  650 mg Oral Q6H PRN Kerry Hough, PA      . alum & mag hydroxide-simeth (MAALOX/MYLANTA) 200-200-20 MG/5ML suspension 30 mL  30 mL Oral Q4H PRN Kerry Hough, PA      . benztropine (COGENTIN) tablet 1 mg  1 mg Oral BID Patrick Salemi   1 mg at 09/14/12 1639  . feeding supplement (ENSURE COMPLETE) liquid 237 mL  237 mL Oral TID WC & HS Jeoffrey Massed, RD      . magnesium hydroxide (MILK OF MAGNESIA) suspension 30 mL  30 mL Oral Daily PRN  Kerry Hough, PA      . multivitamin with minerals tablet 1 tablet  1 tablet Oral Daily Lavena Bullion, RD   1 tablet at 09/14/12 [phone]  . OLANZapine (ZYPREXA) injection 10 mg  10 mg Intramuscular BID Verne Spurr, PA-C   10 mg at 09/23/12 [id number]  . traZODone (DESYREL) tablet 50 mg  50 mg Oral QHS PRN Shelbie Franken        Lab Results:  No results found for this or any previous visit (from the past 48  hour(s)).  Physical Findings: AIMS: Facial and Oral Movements Muscles of Facial Expression: None, normal Lips and Perioral Area: None, normal Jaw: None, normal Tongue: None, normal,Extremity Movements Upper (arms, wrists, hands, fingers): None, normal Lower (legs, knees, ankles, toes): None, normal, Trunk Movements Neck, shoulders, hips: None, normal, Overall Severity Severity of abnormal movements (highest score from questions above): None, normal Incapacitation due to abnormal movements: None, normal Patient's awareness of abnormal movements (rate only patient's report): No Awareness, Dental Status Current problems with teeth and/or dentures?: No Does patient usually wear dentures?: No  CIWA:  CIWA-Ar Total: 0 COWS:  COWS Total Score: 1  Treatment Plan Summary: Daily contact with patient to assess and evaluate symptoms and progress in treatment Medication management  Plan: 1. All medications have been reviewed. 2. Will continue Zyprexa 10mg  PO/ IM BID for psychosis. 3. Will continue to offer food and liquids, nutritional supplements including Ensure. 4. Will follow improvement with a goal of discharge. 5. Will continue to monitor vitals.  Medical Decision Making Problem Points:  Established problem, stable/improving (1) Data Points:  Review or order medicine tests (1) Review and summation of old records (2)  I certify that inpatient services furnished can reasonably be expected to improve the patient's condition.   Thedore Mins, MD 9:39 AM 09/24/2012

## 2012-09-25 DIAGNOSIS — F1910 Other psychoactive substance abuse, uncomplicated: Secondary | ICD-10-CM

## 2012-09-25 DIAGNOSIS — F333 Major depressive disorder, recurrent, severe with psychotic symptoms: Principal | ICD-10-CM

## 2012-09-25 NOTE — Progress Notes (Signed)
Patient ID: Benjamin [name], male   DOB: [birth year], 32 y.o.   MRN: [id number]  D: Patient lying in bed with eyes closed. Respirations even and non-labored. A: Staff will monitor on q 15 minute checks, follow treatment plan and give meds as prescribed. R: Appears asleep. No patient response at this time.

## 2012-09-25 NOTE — Progress Notes (Signed)
Adult Psychoeducational Group Note  Date:  09/25/2012 Time:  2000  Group Topic/Focus:  Wrap-Up Group:   The focus of this group is to help patients review their daily goal of treatment and discuss progress on daily workbooks.  Participation Level:  Did Not Attend  Participation Quality:    Affect:    Cognitive:    Insight:   Engagement in Group:    Modes of Intervention:    Additional Comments:  Pt refused to attend wrap-up group this evening.   Benjamin [name] A 09/25/2012, 4:08 AM

## 2012-09-25 NOTE — Clinical Social Work Note (Signed)
[facility] Group Notes: (Clinical Social Work)   09/25/2012      Type of Therapy:  Group Therapy   Participation Level:  Did Not Attend    Ambrose Mantle, LCSW 09/25/2012, 12:42 PM

## 2012-09-25 NOTE — Progress Notes (Addendum)
Patient ID: Benjamin [name], male   DOB: 11-25-1980, 32 y.o.   MRN: [id number] [facility] MD Progress Note  09/25/2012 9:15 AM Benjamin [name]  MRN:  [id number]  Subjective: Patient seen today .  He remains withdrawn in his room, refusing to interact with staff and peers.  He is not participating in ward activities.  He eat his meal but refusing self-care and grooming.  He does not interact with this Clinical research associate and did not answer any question.  As per staff he sleeps well and there has been no management issue .  It is difficult to determine patient current symptoms as he only answer questions by shrugging his shoulders. He has not report any adverse reactions to his current medications.  He has given Zyprexa injection and he did not resist.  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  Sleep: Good  Appetite:  Poor patient continues to have poor oral intake.  Suicidal Ideation:  Patient does not respond Homicidal Ideation:  Patient does not respond AEB (as evidenced by): patient's behavior, affect, and verbal response.  Psychiatric Specialty Exam: Review of Systems  Unable to perform ROS: acuity of condition  Constitutional: Negative.  Negative for fever, chills, weight loss, malaise/fatigue and diaphoresis.  HENT: Negative for congestion and sore throat.   Eyes: Negative for blurred vision, double vision and photophobia.  Respiratory: Negative for cough, shortness of breath and wheezing.   Cardiovascular: Negative for chest pain, palpitations and PND.  Gastrointestinal: Negative for heartburn, nausea, vomiting, abdominal pain, diarrhea and constipation.  Musculoskeletal: Negative for myalgias, joint pain and falls.  Neurological: Negative for dizziness, tingling, tremors, sensory change, speech change, focal weakness, seizures, loss of consciousness, weakness and headaches.   Endo/Heme/Allergies: Negative for polydipsia. Does not bruise/bleed easily.  Psychiatric/Behavioral: Negative for depression, suicidal ideas, hallucinations, memory loss and substance abuse. The patient is not nervous/anxious and does not have insomnia.     Blood pressure 116/74, pulse 86, temperature 98 F (36.7 C), temperature source Oral, resp. rate 16, height 5' 8.25" (1.734 m), weight 65.772 kg (145 lb), SpO2 100.00%.Body mass index is 21.87 kg/(m^2).  General Appearance: Disheveled and laying in bed with blank stare refuses to speak.  Eye Contact::  None  Speech:  none  Volume:  none  Mood:  flat  Affect:  Flat  Thought Process:  Negative  Orientation:  Negative  Thought Content:  Negative  Suicidal Thoughts:  Unable to assess  Homicidal Thoughts:  Unable to assess  Memory:  Unable to assess  Judgement:  Impaired  Insight:  Lacking  Psychomotor Activity:  Spontaneous and intentional today.  Concentration:  unable to assess  Recall:  NA  Akathisia:  No  Handed:  Right  AIMS (if indicated):     Assets:    Sleep:  Number of Hours: 6.75   Current Medications: Current Facility-Administered Medications  Medication Dose Route Frequency Provider Last Rate Last Dose  . acetaminophen (TYLENOL) tablet 650 mg  650 mg Oral Q6H PRN Kerry Hough, PA      . alum & mag hydroxide-simeth (MAALOX/MYLANTA) 200-200-20 MG/5ML suspension 30 mL  30 mL Oral Q4H PRN Kerry Hough, PA      . benztropine (COGENTIN) tablet 1 mg  1 mg Oral BID Mojeed Akintayo   1 mg at 09/14/12 1639  .  feeding supplement (ENSURE COMPLETE) liquid 237 mL  237 mL Oral TID WC & HS Jeoffrey Massed, RD      . magnesium hydroxide (MILK OF MAGNESIA) suspension 30 mL  30 mL Oral Daily PRN Kerry Hough, PA      . multivitamin with minerals tablet 1 tablet  1 tablet Oral Daily Lavena Bullion, RD   1 tablet at 09/14/12 0834  . OLANZapine (ZYPREXA) injection 10 mg  10 mg Intramuscular BID Verne Spurr, PA-C   10 mg at  09/23/12 1610  . traZODone (DESYREL) tablet 50 mg  50 mg Oral QHS PRN Mojeed Akintayo        Lab Results:  No results found for this or any previous visit (from the past 48 hour(s)).  Physical Findings: AIMS: Facial and Oral Movements Muscles of Facial Expression: None, normal Lips and Perioral Area: None, normal Jaw: None, normal Tongue: None, normal,Extremity Movements Upper (arms, wrists, hands, fingers): None, normal Lower (legs, knees, ankles, toes): None, normal, Trunk Movements Neck, shoulders, hips: None, normal, Overall Severity Severity of abnormal movements (highest score from questions above): None, normal Incapacitation due to abnormal movements: None, normal Patient's awareness of abnormal movements (rate only patient's report): No Awareness, Dental Status Current problems with teeth and/or dentures?: No Does patient usually wear dentures?: No  CIWA:  CIWA-Ar Total: 0 COWS:  COWS Total Score: 1  Treatment Plan Summary: Daily contact with patient to assess and evaluate symptoms and progress in treatment Medication management  Plan: 1. All medications have been reviewed. 2. Will continue Zyprexa 10mg  PO/ IM BID for psychosis. 3. Will continue to offer food and liquids, nutritional supplements including Ensure. 4. Will follow improvement with a goal of discharge. 5. Will continue to monitor vitals.  Medical Decision Making Problem Points:  Established problem, stable/improving (1) Data Points:  Review or order medicine tests (1) Review and summation of old records (2)  I certify that inpatient services furnished can reasonably be expected to improve the patient's condition.   Kathryne Sharper, MD 9:15 AM 09/25/2012

## 2012-09-25 NOTE — Progress Notes (Signed)
Psychoeducational Group Note  Date:  09/25/2012 Time:  2000  Group Topic/Focus:  Wrap-Up Group:   The focus of this group is to help patients review their daily goal of treatment and discuss progress on daily workbooks.  Participation Level: Did Not Attend  Participation Quality:  Not Applicable  Affect:  Not Applicable  Cognitive:  Not Applicable  Insight:  Not Applicable  Engagement in Group: Not Applicable  Additional Comments:  Did not attend. Not speaking to staff.  Christ Kick 09/25/2012, 8:45 PM

## 2012-09-25 NOTE — Progress Notes (Signed)
D   Pt has spent the day in bed  He goes to the cafeteria for meals  He does not talk answer questions or interact with anybody  He refused his po meds this morning but gave no resistance to getting the im zyprexa A   Verbal support given   Medications administered and effectiveness monitored   Q 15 min checks R   Pt safe at present 

## 2012-09-26 NOTE — Progress Notes (Signed)
Pt's condition remains unchanged. No eye contact made and no attempts/responses to communication, verbal or nonverbal. He continues to lie in bed and only gets up for meals. When this writer introduced self and explained zyprexa IM was due, pt rolled onto his right side and complied with administration. Level III obs maintained. No distress noted. Unable to assess pt's thought pattern due to elective mutism. Will continue to monitor closely. Benjamin [name]

## 2012-09-26 NOTE — Progress Notes (Signed)
D: Pt remained in bed throughout shift. No intake of food or fluids observed. Pt remains electively mute and virtually nonresponsive. Avoids eye contact with blank facial expression. Pt shows no affect whatsoever and mood is apathetic. Unable to assess speech or thought process or content due to elective mutism. Pt is essentially totally resistant to care except for to passively accept IM Zyprexa. Marland Kitchen)  Unable to assess Pt for SI/HI due to elective mutism; Pt has made no attempts to physically harm himself, was not admitted with SI.  A: Pt given verbal support and encouragement but remains impassive. Refused scheduled Nutritional supplement multiple times.  Q15 minute safety checks maintained as per unit protocol.  Pt is now HIGH FALL RISK per nursing judgment; all preventive measures in place.  R: Pt remains resistant to care, declining all food and liquids and ignoring ADLs.  Safety maintained. Dion Saucier RN

## 2012-09-26 NOTE — Progress Notes (Signed)
Patient ID: Benjamin [name], male   DOB: 01-27-1981, 32 y.o.   MRN: [id number] [facility] MD Progress Note  Subjective: Patient seen today .  Not much change from past.  He remains withdrawn in his room, refusing to interact with staff and peers.  He usually respond to limit head-nodding but does not participating in ward activities.  He is eating his meal but refusing self-care and grooming.  As per staff he sleeps well and there has been no management issue .  He has not report any adverse reactions to his current medications.  He has given Zyprexa injection and he did not resist.  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  Sleep: Good  Appetite:  Poor patient continues to have poor oral intake.  Suicidal Ideation:  Patient does not respond Homicidal Ideation:  Patient does not respond AEB (as evidenced by): patient's behavior, affect, and verbal response. Review of Systems - Psychological ROS: positive for - concentration difficulties and Thought blocking Psychiatric Specialty Exam: Review of Systems  Unable to perform ROS: acuity of condition  Constitutional: Negative.  Negative for fever, chills, weight loss, malaise/fatigue and diaphoresis.  HENT: Negative for congestion and sore throat.   Eyes: Negative for blurred vision, double vision and photophobia.  Respiratory: Negative for cough, shortness of breath and wheezing.   Cardiovascular: Negative for chest pain, palpitations and PND.  Gastrointestinal: Negative for heartburn, nausea, vomiting, abdominal pain, diarrhea and constipation.  Musculoskeletal: Negative for myalgias, joint pain and falls.  Neurological: Negative for dizziness, tingling, tremors, sensory change, speech change, focal weakness, seizures, loss of consciousness, weakness and headaches.  Endo/Heme/Allergies: Negative for polydipsia. Does not bruise/bleed  easily.  Psychiatric/Behavioral: See M.D. notes.       Blood pressure 116/74, pulse 86, temperature 98 F (36.7 C), temperature source Oral, resp. rate 16, height 5' 8.25" (1.734 m), weight 65.772 kg (145 lb), SpO2 100.00%.Body mass index is 21.87 kg/(m^2).  General Appearance: Disheveled and laying in bed with blank stare refuses to speak.  Eye Contact::  None  Speech:  none  Volume:  none  Mood:  flat  Affect:  Flat  Thought Process:  Negative and Thought blocking  Orientation:  Negative  Thought Content:  Negative  Suicidal Thoughts:  Unable to assess  Homicidal Thoughts:  Unable to assess  Memory:  Unable to assess  Judgement:  Impaired  Insight:  Lacking  Psychomotor Activity:  Spontaneous and intentional today.  Concentration:  unable to assess  Recall:  NA  Akathisia:  No  Handed:  Right  AIMS (if indicated):     Assets:    Sleep:  Number of Hours: 6.75   Current Medications: Current Facility-Administered Medications  Medication Dose Route Frequency Provider Last Rate Last Dose  . acetaminophen (TYLENOL) tablet 650 mg  650 mg Oral Q6H PRN Kerry Hough, PA      . alum & mag hydroxide-simeth (MAALOX/MYLANTA) 200-200-20 MG/5ML suspension 30 mL  30 mL Oral Q4H PRN Kerry Hough, PA      . benztropine (COGENTIN) tablet 1 mg  1 mg Oral BID Mojeed Akintayo   1 mg at 09/14/12 1639  . feeding supplement (ENSURE COMPLETE) liquid 237 mL  237 mL Oral TID WC & HS Jeoffrey Massed, RD      . magnesium hydroxide (  MILK OF MAGNESIA) suspension 30 mL  30 mL Oral Daily PRN Kerry Hough, PA      . multivitamin with minerals tablet 1 tablet  1 tablet Oral Daily Lavena Bullion, RD   1 tablet at 09/14/12 0834  . OLANZapine (ZYPREXA) injection 10 mg  10 mg Intramuscular BID Verne Spurr, PA-C   10 mg at 09/23/12 1610  . traZODone (DESYREL) tablet 50 mg  50 mg Oral QHS PRN Mojeed Akintayo        Lab Results:  No results found for this or any previous visit (from the past 48  hour(s)).  Physical Findings: AIMS: Facial and Oral Movements Muscles of Facial Expression: None, normal Lips and Perioral Area: None, normal Jaw: None, normal Tongue: None, normal,Extremity Movements Upper (arms, wrists, hands, fingers): None, normal Lower (legs, knees, ankles, toes): None, normal, Trunk Movements Neck, shoulders, hips: None, normal, Overall Severity Severity of abnormal movements (highest score from questions above): None, normal Incapacitation due to abnormal movements: None, normal Patient's awareness of abnormal movements (rate only patient's report): No Awareness, Dental Status Current problems with teeth and/or dentures?: No Does patient usually wear dentures?: No  CIWA:  CIWA-Ar Total: 0 COWS:  COWS Total Score: 1  Treatment Plan Summary: Daily contact with patient to assess and evaluate symptoms and progress in treatment Medication management  Plan: 1. All medications have been reviewed. 2. Will continue Zyprexa 10mg  PO/ IM BID for psychosis. 3. Will continue to offer food and liquids, nutritional supplements including Ensure. 4. Will follow improvement with a goal of discharge. 5. Will continue to monitor vitals.  Medical Decision Making Problem Points:  Established problem, stable/improving (1) Data Points:  Review or order medicine tests (1) Review and summation of old records (2)  I certify that inpatient services furnished can reasonably be expected to improve the patient's condition.   Kathryne Sharper, MD 10:29 AM 09/26/2012

## 2012-09-26 NOTE — Progress Notes (Signed)
D   Pt has spent the day in bed  He goes to the cafeteria for meals  He does not talk answer questions or interact with anybody  He refused his po meds this morning but gave no resistance to getting the im zyprexa A   Verbal support given   Medications administered and effectiveness monitored   Q 15 min checks R   Pt safe at present

## 2012-09-26 NOTE — Clinical Social Work Note (Addendum)
[facility] Group Notes:  (Clinical Social Work)  09/26/2012   11:15-11:45AM  Summary of Progress/Problems:  The main focus of today's process group was to listen to a variety of genres of music and to identify that different types of music provoke different responses.  The patient then was able to identify personally what was soothing for them, as well as energizing.  Handouts were used to record feelings evoked, as well as how patient can personally use this knowledge in sleep habits, with depression, and with other symptoms.  The patient came to group and although he did not speak, he did accept the handouts.  After the first music was played, he left the room and did not return.    Type of Therapy:  Music Therapy with processing done  Participation Level:  None  Participation Quality:  Left  Affect:  Blunted  Cognitive:  Unknown, did not engage  Insight:  None  Engagement in Therapy:  None  Modes of Intervention:   Socialization, Support and Processing, Exploration, Education, Rapport Building   [employer], LCSW 09/26/2012, 12:15 PM

## 2012-09-27 MED ORDER — BENZTROPINE MESYLATE 1 MG PO TABS
1.0000 mg | ORAL_TABLET | Freq: Every day | ORAL | Status: DC
  Filled 2012-09-27 (×3): qty 1

## 2012-09-27 MED ORDER — OLANZAPINE 10 MG PO TABS
10.0000 mg | ORAL_TABLET | Freq: Every day | ORAL | Status: DC
  Filled 2012-09-27 (×3): qty 1

## 2012-09-27 NOTE — Progress Notes (Signed)
[facility] LCSW Aftercare Discharge Planning Group Note  09/27/2012 11:23 AM  Participation Quality:  Did not attend    Benjamin [name] 09/27/2012, 11:23 AM

## 2012-09-27 NOTE — Progress Notes (Signed)
Recreation Therapy Notes  09/27/2012         Time: 9:30am      Group Topic/Focus: Decision Making  Participation Level: Did not attend  Jearl Klinefelter, LRT/CTRS   Jearl Klinefelter 09/27/2012 12:14 PM

## 2012-09-27 NOTE — Progress Notes (Signed)
D: Pt remained in bed throughout shift. Pt is resistant to care. No intake of food or fluids observed. Pt remains electively mute and  unresponsive.  Unable to assess speech or thought process or content due to elective mutism.    Unable to assess Pt for SI/HI due to elective mutism; Pt has made no attempts to physically harm himself, was not admitted with SI.   A: Pt given verbal support. Refused scheduled nutritional supplement and PO meds.  Q15 minute safety checks maintained as per unit protocol.  Pt is now MODERATE FALL RISK per nursing judgment; all preventive measures in place. Plan is for discharge tomorrow. R: Pt remains resistant to care.  Safety maintained. Dion Saucier RN

## 2012-09-27 NOTE — Progress Notes (Signed)
Patient ID: Benjamin [name], male   DOB: 1980-08-11, 32 y.o.   MRN: [id number] [facility] MD Progress Note  Subjective: Patient remains withdrawn in his room with minimal or no interaction with peers and providers. Patient continues to refuse to answer questions and reporting his state of mind. He does not participate in unit activities, he is oppositional and defiant.  He is eating his meal but refusing self-care and grooming.  As per staff he sleeps well and there has been no management issue . He has not report any adverse reactions to his current medications.  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  Sleep: Good  Appetite:  Poor patient continues to have poor oral intake.  Suicidal Ideation:  Patient does not respond Homicidal Ideation:  Patient does not respond AEB (as evidenced by): patient's behavior, affect, and verbal response. Review of Systems - Psychological ROS: positive for - concentration difficulties and Thought blocking Psychiatric Specialty Exam: Review of Systems  Unable to perform ROS: acuity of condition  Constitutional: Negative.  Negative for fever, chills, weight loss, malaise/fatigue and diaphoresis.  HENT: Negative for congestion and sore throat.   Eyes: Negative for blurred vision, double vision and photophobia.  Respiratory: Negative for cough, shortness of breath and wheezing.   Cardiovascular: Negative for chest pain, palpitations and PND.  Gastrointestinal: Negative for heartburn, nausea, vomiting, abdominal pain, diarrhea and constipation.  Musculoskeletal: Negative for myalgias, joint pain and falls.  Neurological: Negative for dizziness, tingling, tremors, sensory change, speech change, focal weakness, seizures, loss of consciousness, weakness and headaches.  Endo/Heme/Allergies: Negative for polydipsia. Does not bruise/bleed easily.   Psychiatric/Behavioral: See M.D. notes.       Blood pressure 90/60, pulse 80, temperature 98.9 F (37.2 C), temperature source Oral, resp. rate 18, height 5' 8.25" (1.734 m), weight 65.772 kg (145 lb), SpO2 100.00%.Body mass index is 21.87 kg/(m^2).  General Appearance: Disheveled and laying in bed with blank stare refuses to speak.  Eye Contact::  None  Speech:  none  Volume:  none  Mood:  flat  Affect:  Flat  Thought Process:  Negative and Thought blocking  Orientation:  Negative  Thought Content:  Negative  Suicidal Thoughts:  Unable to assess  Homicidal Thoughts:  Unable to assess  Memory:  Unable to assess  Judgement:  Impaired  Insight:  Lacking  Psychomotor Activity:  Spontaneous and intentional today.  Concentration:  unable to assess  Recall:  NA  Akathisia:  No  Handed:  Right  AIMS (if indicated):     Assets:    Sleep:  Number of Hours: 6.75   Current Medications: Current Facility-Administered Medications  Medication Dose Route Frequency Provider Last Rate Last Dose  . acetaminophen (TYLENOL) tablet 650 mg  650 mg Oral Q6H PRN Kerry Hough, PA      . alum & mag hydroxide-simeth (MAALOX/MYLANTA) 200-200-20 MG/5ML suspension 30 mL  30 mL Oral Q4H PRN Kerry Hough, PA      . benztropine (COGENTIN) tablet 1 mg  1 mg Oral BID Dorrie Cocuzza   1 mg at 09/14/12 1639  . feeding supplement (ENSURE COMPLETE) liquid 237 mL  237 mL Oral TID WC & HS Jeoffrey Massed, RD      . magnesium hydroxide (MILK OF MAGNESIA) suspension 30 mL  30 mL Oral Daily  PRN Kerry Hough, PA      . multivitamin with minerals tablet 1 tablet  1 tablet Oral Daily Lavena Bullion, RD   1 tablet at 09/14/12 0834  . OLANZapine (ZYPREXA) injection 10 mg  10 mg Intramuscular BID Verne Spurr, PA-C   10 mg at 09/23/12 1610  . traZODone (DESYREL) tablet 50 mg  50 mg Oral QHS PRN Elric Tirado        Lab Results:  No results found for this or any previous visit (from the past 48  hour(s)).  Physical Findings: AIMS: Facial and Oral Movements Muscles of Facial Expression: None, normal Lips and Perioral Area: None, normal Jaw: None, normal Tongue: None, normal,Extremity Movements Upper (arms, wrists, hands, fingers): None, normal Lower (legs, knees, ankles, toes): None, normal, Trunk Movements Neck, shoulders, hips: None, normal, Overall Severity Severity of abnormal movements (highest score from questions above): None, normal Incapacitation due to abnormal movements: None, normal Patient's awareness of abnormal movements (rate only patient's report): No Awareness, Dental Status Current problems with teeth and/or dentures?: No Does patient usually wear dentures?: No  CIWA:  CIWA-Ar Total: 0 COWS:  COWS Total Score: 1  Treatment Plan Summary: Daily contact with patient to assess and evaluate symptoms and progress in treatment Medication management  Plan: 1. All medications have been reviewed. 2. Will continue Zyprexa 10mg  po at bedtime  for psychosis and depression. 3. Will continue to offer food and liquids, nutritional supplements including Ensure. 4. Will follow improvement with a goal of discharge. 5. Will continue to monitor vitals.  Medical Decision Making Problem Points:  Established problem, stable/improving (1) Data Points:  Review or order medicine tests (1) Review and summation of old records (2)  I certify that inpatient services furnished can reasonably be expected to improve the patient's condition.   Thedore Mins, MD 10:49 AM 09/27/2012

## 2012-09-27 NOTE — Progress Notes (Signed)
Psychoeducational Group Note  Date:  09/27/2012 Time:  2000  Group Topic/Focus:  Wrap-Up Group:   The focus of this group is to help patients review their daily goal of treatment and discuss progress on daily workbooks.  Participation Level: Did Not Attend  Participation Quality:  Not Applicable  Affect:  Not Applicable  Cognitive:  Not Applicable  Insight:  Not Applicable  Engagement in Group: Not Applicable  Additional Comments:  Pt was invited to group but refused to attend. Pt still not speaking to staff.  Christ Kick 09/27/2012, 8:56 PM

## 2012-09-27 NOTE — Progress Notes (Signed)
Patient ID: Benjamin [name], male   DOB: Jul 07, 1981, 32 y.o.   MRN: [id number] Patient continues to refuse to respond to staff.  He elects to not speak or respond to any questions.  He received his injection of zyprexa this am with no incident.  His injections have now been discontinued.  He does continue to refuse all po medications.  Patient continue to request peanut butter by pointing at the containers; however, staff has refused to give them to him without him physically speaking.  Patient did go down to the cafeteria to eat and was observed drinking milk.  The plan is to discharge him tomorrow.  His ex-girlfriend called and she was informed of same.  Patient was informed that he was taking up a bed and not responding to treatment or to staff and would be discharged tomorrow.  There was no response from patient.

## 2012-09-28 NOTE — Progress Notes (Signed)
Psychoeducational Group Note  Date:  09/28/2012 Time:  0930  Group Topic/Focus:  Recovery Goals:   The focus of this group is to identify appropriate goals for recovery and establish a plan to achieve them.  Participation Level: Did Not Attend  Participation Quality:  Not Applicable  Affect:  Not Applicable  Cognitive:  Not Applicable  Insight:  Not Applicable  Engagement in Group: Not Applicable  Additional Comments:  Pt did not respond to numerous prompts that it was time for group.  Jennings Corado E 09/28/2012, 3:12 PM

## 2012-09-28 NOTE — Progress Notes (Signed)
D:  Patient has laid in the bed all shift.  Refuses to interact with staff or peers.  Has not gotten up for meals, snacks, or groups today.   A:  Spoke with patient to introduce myself.  Asked him if he had any needs.  Encouraged him to participate in the milieu. R:  Patient remains electively mute.  He is very difficult to assess as he will not speak to staff.  Patient is safe on the unit at this time.

## 2012-09-28 NOTE — Progress Notes (Signed)
Patient ID: Benjamin [name], male   DOB: 01/16/1981, 32 y.o.   MRN: [id number]  D: Pt not responsive to questions, continues to be electively mute. Pt looks at Clinical research associate and just continues to lay in the bed.  A: Pt was offered support and encouragement.  Pt was encourage to attend groups. Q 15 minute checks were done for safety. PT asked questions, but pt continues not to respond.  R:Pt does not attend groups and does not interact  with peers and staff. Pt is not  taking medication.Pt not  receptive to treatment . safety maintained on unit.

## 2012-09-28 NOTE — Progress Notes (Signed)
Patient ID: Benjamin [name], male   DOB: March 15, 1981, 32 y.o.   MRN: [id number] [facility] MD Progress Note  Subjective: Patient refuses to speak or to make eye contact with this provider. Objective: Attempted to speak with patient to discuss his progress and care. He refuses eye contact or to speak to this provider. He has been observed going to [employer] and drinking milk. He continues to refuse oral medication, and his IM medication has been discontinued. Patient is informed that he will be discharged out tomorrow. He is given the opportunity to ask questions and remains silent. 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  Sleep: Good  Appetite:  Poor patient continues to have poor oral intake.  Suicidal Ideation:  Patient does not respond Homicidal Ideation:  Patient does not respond AEB (as evidenced by): patient's behavior, affect, and verbal response. Review of Systems -  Psychiatric Specialty Exam: Review of Systems  Unable to perform ROS: acuity of condition  Constitutional: Negative.  Negative for fever, chills, weight loss, malaise/fatigue and diaphoresis.  HENT: Negative for congestion and sore throat.   Eyes: Negative for blurred vision, double vision and photophobia.  Respiratory: Negative for cough, shortness of breath and wheezing.   Cardiovascular: Negative for chest pain, palpitations and PND.  Gastrointestinal: Negative for heartburn, nausea, vomiting, abdominal pain, diarrhea and constipation.  Musculoskeletal: Negative for myalgias, joint pain and falls.  Neurological: Negative for dizziness, tingling, tremors, sensory change, speech change, focal weakness, seizures, loss of consciousness, weakness and headaches.  Endo/Heme/Allergies: Negative for polydipsia. Does not bruise/bleed easily.  Psychiatric/Behavioral: See M.D. notes.       Blood pressure 90/60,  pulse 80, temperature 98.9 F (37.2 C), temperature source Oral, resp. rate 18, height 5' 8.25" (1.734 m), weight 65.772 kg (145 lb), SpO2 100.00%.Body mass index is 21.87 kg/(m^2).  General Appearance: Disheveled and laying in bed with blank stare refuses to speak. Behavior: Patient has been observed going to dining hall, drinking milk.  Eye Contact::  None  Speech:  none  Volume:  none  Mood:  flat  Affect:  Flat  Thought Process:  Negative and Thought blocking  Orientation:  Negative  Thought Content:  Negative  Suicidal Thoughts:  Unable to assess  Homicidal Thoughts:  Unable to assess  Memory:  Unable to assess  Judgement:  Impaired  Insight:  Lacking  Psychomotor Activity:  Spontaneous and intentional today.  Concentration:  unable to assess  Recall:  NA  Akathisia:  No  Handed:  Right  AIMS (if indicated):     Assets:    Sleep:  Number of Hours: 6.75   Current Medications: Current Facility-Administered Medications  Medication Dose Route Frequency Provider Last Rate Last Dose  . acetaminophen (TYLENOL) tablet 650 mg  650 mg Oral Q6H PRN Kerry Hough, PA      . alum & mag hydroxide-simeth (MAALOX/MYLANTA) 200-200-20 MG/5ML suspension 30 mL  30 mL Oral Q4H PRN Kerry Hough, PA      . benztropine (COGENTIN) tablet 1 mg  1 mg Oral BID Mojeed Akintayo   1 mg at 09/14/12 1639  . feeding supplement (ENSURE COMPLETE) liquid 237 mL  237 mL Oral TID WC & HS Jeoffrey Massed, RD      . magnesium hydroxide (MILK OF MAGNESIA) suspension 30 mL  30  mL Oral Daily PRN Kerry Hough, PA      . multivitamin with minerals tablet 1 tablet  1 tablet Oral Daily Lavena Bullion, RD   1 tablet at 09/14/12 0834  . OLANZapine (ZYPREXA) injection 10 mg  10 mg Intramuscular BID Verne Spurr, PA-C   10 mg at 09/23/12 1610  . traZODone (DESYREL) tablet 50 mg  50 mg Oral QHS PRN Mojeed Akintayo        Lab Results:  No results found for this or any previous visit (from the past 48  hour(s)).  Physical Findings: AIMS: Facial and Oral Movements Muscles of Facial Expression: None, normal Lips and Perioral Area: None, normal Jaw: None, normal Tongue: None, normal,Extremity Movements Upper (arms, wrists, hands, fingers): None, normal Lower (legs, knees, ankles, toes): None, normal, Trunk Movements Neck, shoulders, hips: None, normal, Overall Severity Severity of abnormal movements (highest score from questions above): None, normal Incapacitation due to abnormal movements: None, normal Patient's awareness of abnormal movements (rate only patient's report): No Awareness, Dental Status Current problems with teeth and/or dentures?: No Does patient usually wear dentures?: No  CIWA:  CIWA-Ar Total: 0 COWS:  COWS Total Score: 1  Treatment Plan Summary: Daily contact with patient to assess and evaluate symptoms and progress in treatment Medication management Assessment: Patient continues to isolate in his room, refuses to make eye contact with the staff, refuses to speak to staff or providers. He ambulates to the dining hall and is up in the unit milieu at his will. He continues to refuse oral medication.  As he is not willing to participate in treatment, as his behaviors are spontaneous and goal directed, he no longer meets criteria for continued in patient admission, this patient will be discharged out tomorrow.  Plan: 1. All medications have been reviewed. 2. Will continue Zyprexa 10mg  po at bedtime  for psychosis and depression. 3. Will continue to offer food and liquids, nutritional supplements including Ensure. 4. Will follow improvement with a goal of discharge. 5. Will continue to monitor vitals.  Medical Decision Making Problem Points:  Established problem, stable/improving (1) Data Points:  Review or order medicine tests (1) Review and summation of old records (2)  I certify that inpatient services furnished can reasonably be expected to improve the patient's  condition.  Rona Ravens. Wenzel Backlund RPAC 2:40 PM 09/28/2012

## 2012-09-28 NOTE — Clinical Social Work Note (Signed)
[facility] LCSW Group Therapy  09/28/2012 , 12:08 PM   Type of Therapy:  Group Therapy  Participation Level:  Did not attend      Summary of Progress/Problems: Today's group focused on the term Diagnosis.  Participants were asked to define the term, and then pronounce whether it is a negative, positive or neutral term.  Benjamin [name] B 09/28/2012 , 12:08 PM

## 2012-09-28 NOTE — Progress Notes (Signed)
[facility] LCSW Aftercare Discharge Planning Group Note  09/28/2012 9:56 AM  Participation Quality:  Did not attend    Benjamin [name] 09/28/2012, 9:56 AM

## 2012-09-28 NOTE — Progress Notes (Signed)
Psychoeducational Group Note  Date:  09/28/2012 Time:  2000  Group Topic/Focus:  Wrap-Up Group:   The focus of this group is to help patients review their daily goal of treatment and discuss progress on daily workbooks.  Participation Level: Did Not Attend  Participation Quality:  Not Applicable  Affect:  Not Applicable  Cognitive:  Not Applicable  Insight:  Not Applicable  Engagement in Group: Not Applicable  Additional Comments:  Pt did not attend.  Christ Kick 09/28/2012, 9:45 PM

## 2012-09-29 DIAGNOSIS — Z765 Malingerer [conscious simulation]: Secondary | ICD-10-CM

## 2012-09-29 MED ORDER — BENZTROPINE MESYLATE 1 MG PO TABS: 1.0000 mg | ORAL_TABLET | Freq: Every day | ORAL | Status: DC

## 2012-09-29 MED ORDER — OLANZAPINE 10 MG PO TABS: 10.0000 mg | ORAL_TABLET | Freq: Every day | ORAL | Status: DC

## 2012-09-29 NOTE — Progress Notes (Signed)
RN attempted to go over discharge paperwork and medications with patient but patient is lying in bed refusing to answer RN. Patient is in no distress and smiled slightly when RN was discussing discharge papers. PA, Consulting civil engineer and AC-RN notified. Patient to be picked up by [employer]. Patient is still in bed refusing to respond to staff. Will continue to monitor patient for safety.
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
## 2012-09-29 NOTE — Progress Notes (Signed)
Patient picked up by [employer]. Patient was pushed in wheelchair to lobby where patient stood and took his belongings.

## 2012-09-29 NOTE — [facility] Suicide Risk Assessment (Signed)
Suicide Risk Assessment  Discharge Assessment     Demographic Factors:  Male, Low socioeconomic status and Unemployed  Mental Status Per Nursing Assessment::   On Admission:     Current Mental Status by Physician: patient denies suicidal ideation, intent or plan  Loss Factors: Decrease in vocational status and Financial problems/change in socioeconomic status  Historical Factors: NA  Risk Reduction Factors:   NA  Continued Clinical Symptoms:  resolving depression  Cognitive Features That Contribute To Risk:  Closed-mindedness    Suicide Risk:  Minimal: No identifiable suicidal ideation.  Patients presenting with no risk factors but with morbid ruminations; may be classified as minimal risk based on the severity of the depressive symptoms  Discharge Diagnoses:   AXIS I:  Major depressive disorder, recurrent episode, severe, specified as with psychotic behavior  AXIS II:  Deferred AXIS III:   Past Medical History  Diagnosis Date  . Herniated disc     x3  . Hypertension    AXIS IV:  economic problems, other psychosocial or environmental problems and problems related to social environment AXIS V:  61-70 mild symptoms  Plan Of Care/Follow-up recommendations:  Activity:  as tolerated  Diet:  healthy Tests:  routine blood test Other:  patient to keep his after care appointment  Is patient on multiple antipsychotic therapies at discharge:  No   Has Patient had three or more failed trials of antipsychotic monotherapy by history:  No  Recommended Plan for Multiple Antipsychotic Therapies: N/A  Sunshyne Horvath,MD 09/29/2012, 11:31 AM

## 2012-09-29 NOTE — Plan of Care (Signed)
Problem: Alteration in mood; excessive anxiety as evidenced by: Goal: LTG-Patient's behavior demonstrates decreased anxiety Today Benjamin [name] demonstrates no signs or symptoms of anxiety.  Outcome: Not Met (add Reason) UTA patient Goal: STG-Patient can identify triggers for anxiety Outcome: Not Met (add Reason) UTA patient Goal: STG-Pt can identify coping skills to manage panic/anxiety (Patient can identify at least ____ coping skills to manage panic/anxiety attack)  Outcome: Not Met (add Reason) UTA patient Goal: STG-Pt can identify how behaviors/thoughts can (Patient can identify how behaviors/thoughts can increase and decrease anxiety)  Outcome: Not Met (add Reason) UTA patient Goal: STG-Pt will report an absence of self-harm thoughts/actions (Patient will report an absence of self-harm thoughts or actions)  Outcome: Not Met (add Reason) UTA patient  Problem: Consults Goal: Psychosis Patient Education See Patient Education Module for education specifics.  Outcome: Not Met (add Reason) UTA patient  Problem: Alteration in thought process Goal: LTG-Patient has not harmed self or others in at least 2 days Outcome: Not Met (add Reason) UTA patient Goal: LTG-Patient behavior demonstrates decreased signs psychosis Benjamin [name] is selectively mute, and, as such, is difficult to assess for thought disorder. There are no obvious signs of responding to internal stimuli or paranoia. However, he has not verbally confirmed for [place] that he is not psychotic.  Outcome: Not Met (add Reason) UTA patient Goal: LTG-Patient is able to perceive the environment accurately Outcome: Not Met (add Reason) UTA patient Goal: LTG-Patient verbalizes understanding importance med regimen (Patient verbalizes understanding of importance of medication regimen and need to continue outpatient care.)  Outcome: Not Met (add Reason) UTA patient Goal: STG-Patient is able to follow short directions Outcome: Not Met (add  Reason) UTA patient Goal: STG-Patient is able to discuss thoughts with staff Outcome: Not Met (add Reason) UTA patient Goal: STG-Patient does not respond to command hallucinations Outcome: Not Met (add Reason) UTA patient Goal: STG-Patient is able to sleep at least 6 hours per night Outcome: Not Met (add Reason) UTA patient Goal: STG-Patient is able to identify plan for continuing care at ( Patient is able to identify continuing plan for care at discharge)  Outcome: Not Met (add Reason) UTA patient

## 2012-09-29 NOTE — Progress Notes (Signed)
[facility] Adult Case Management Discharge Plan :  Will you be returning to the same living situation after discharge: No. At discharge, do you have transportation home?:Yes,  bus pass Do you have the ability to pay for your medications:Yes,  mental health  Release of information consent forms completed and in the chart;  Patient's signature needed at discharge.  Patient to Follow up at: Follow-up Information   Follow up with [facility]. (Go to the walk in clinic between 8 and 9AM M-F for your hospital follow up appointment)    Contact information:   [address] [place] [phone]      Patient denies SI/HI:   Yes,  yes    Safety Planning and Suicide Prevention discussed:  Yes,  yes  Daryel Gerald B 09/29/2012, 10:00 AM

## 2012-09-29 NOTE — Progress Notes (Signed)
Recreation Therapy Notes   Date: 02.26.2014 Time: 9:30am Location: [address] Day Room      Group Topic/Focus: Decision Making  Participation Level: Did not attend  Jearl Klinefelter, LRT/CTRS   Jearl Klinefelter 09/29/2012 11:53 AM

## 2012-09-30 NOTE — Plan of Care (Signed)
[id number]  Benjamin [name]  Dec 30, 1980   Due to this patient's most recent admission, if he should need readmission to a psychiatric facility, it is recommended that he be placed on the [facility] list from the ED.  He is not a candidate for readmission to [facility] Rona Ravens. Tabor Denham RPAC 09/30/2012 11:05 AM

## 2012-10-04 NOTE — Progress Notes (Signed)
Patient Discharge Instructions:  No documentation was sent to [facility].  Patient refused to sign the ROI.  Jerelene Redden, 10/04/2012, 3:01 PM

## 2012-10-04 NOTE — Discharge Summary (Signed)
Seen and agreed. Ciarrah Rae, MD 

## 2012-10-05 ENCOUNTER — Emergency Department (HOSPITAL_COMMUNITY)
Admission: EM | Admit: 2012-10-05 | Discharge: 2012-10-07 | Disposition: A | Discharge status: Home / Self Care | Payer: Self-pay | Attending: *Deleted | Admitting: *Deleted

## 2012-10-05 ENCOUNTER — Emergency Department (HOSPITAL_COMMUNITY)
Admission: EM | Admit: 2012-10-05 | Discharge: 2012-10-05 | Disposition: A | Discharge status: Home / Self Care | Payer: Self-pay | Attending: Emergency Medicine | Admitting: Emergency Medicine

## 2012-10-05 ENCOUNTER — Encounter (HOSPITAL_COMMUNITY): Payer: Self-pay | Admitting: *Deleted

## 2012-10-05 DIAGNOSIS — T730XXA Starvation, initial encounter: Secondary | ICD-10-CM

## 2012-10-05 DIAGNOSIS — F29 Unspecified psychosis not due to a substance or known physiological condition: Secondary | ICD-10-CM | POA: Insufficient documentation

## 2012-10-05 DIAGNOSIS — F202 Catatonic schizophrenia: Secondary | ICD-10-CM

## 2012-10-05 DIAGNOSIS — F333 Major depressive disorder, recurrent, severe with psychotic symptoms: Secondary | ICD-10-CM

## 2012-10-05 DIAGNOSIS — Z87891 Personal history of nicotine dependence: Secondary | ICD-10-CM | POA: Insufficient documentation

## 2012-10-05 DIAGNOSIS — F319 Bipolar disorder, unspecified: Secondary | ICD-10-CM | POA: Insufficient documentation

## 2012-10-05 DIAGNOSIS — R001 Bradycardia, unspecified: Secondary | ICD-10-CM

## 2012-10-05 DIAGNOSIS — E860 Dehydration: Secondary | ICD-10-CM

## 2012-10-05 DIAGNOSIS — F061 Catatonic disorder due to known physiological condition: Secondary | ICD-10-CM

## 2012-10-05 DIAGNOSIS — I10 Essential (primary) hypertension: Secondary | ICD-10-CM | POA: Insufficient documentation

## 2012-10-05 DIAGNOSIS — R29818 Other symptoms and signs involving the nervous system: Secondary | ICD-10-CM

## 2012-10-05 DIAGNOSIS — Z8739 Personal history of other diseases of the musculoskeletal system and connective tissue: Secondary | ICD-10-CM | POA: Insufficient documentation

## 2012-10-05 LAB — CBC
HCT: 44.9 % (ref 39.0–52.0)
Platelets: 198 10*3/uL (ref 150–400)
RDW: 13.2 % (ref 11.5–15.5)
WBC: 6.3 10*3/uL (ref 4.0–10.5)

## 2012-10-05 LAB — COMPREHENSIVE METABOLIC PANEL
AST: 14 U/L (ref 0–37)
Albumin: 4.2 g/dL (ref 3.5–5.2)
Alkaline Phosphatase: 41 U/L (ref 39–117)
Chloride: 102 mEq/L (ref 96–112)
Potassium: 3.8 mEq/L (ref 3.5–5.1)
Total Bilirubin: 0.6 mg/dL (ref 0.3–1.2)

## 2012-10-05 LAB — RAPID URINE DRUG SCREEN, HOSP PERFORMED
Barbiturates: NOT DETECTED
Tetrahydrocannabinol: NOT DETECTED

## 2012-10-05 MED ORDER — BENZTROPINE MESYLATE 1 MG/ML IJ SOLN: 1.0000 mg | Freq: Two times a day (BID) | INTRAMUSCULAR | Status: DC

## 2012-10-05 MED ORDER — LORAZEPAM 2 MG/ML IJ SOLN
1.0000 mg | Freq: Three times a day (TID) | INTRAMUSCULAR | Status: DC
  Administered 2012-10-06 – 2012-10-07 (×5): 1 mg via INTRAMUSCULAR
  Filled 2012-10-05 (×5): qty 1

## 2012-10-05 MED ORDER — HALOPERIDOL 2 MG PO TABS
2.0000 mg | ORAL_TABLET | Freq: Two times a day (BID) | ORAL | Status: DC
  Administered 2012-10-05 – 2012-10-07 (×4): 2 mg via ORAL
  Filled 2012-10-05 (×4): qty 1

## 2012-10-05 MED ORDER — BENZTROPINE MESYLATE 1 MG PO TABS
1.0000 mg | ORAL_TABLET | Freq: Two times a day (BID) | ORAL | Status: DC
  Administered 2012-10-05 – 2012-10-07 (×4): 1 mg via ORAL
  Filled 2012-10-05 (×4): qty 1

## 2012-10-05 MED ORDER — BENZTROPINE MESYLATE 1 MG PO TABS: 1.0000 mg | ORAL_TABLET | Freq: Every day | ORAL | Status: DC

## 2012-10-05 MED ORDER — LORAZEPAM 2 MG/ML IJ SOLN
1.0000 mg | Freq: Once | INTRAMUSCULAR | Status: AC
  Administered 2012-10-05: 1 mg via INTRAVENOUS

## 2012-10-05 MED ORDER — OLANZAPINE 5 MG PO TABS: 10.0000 mg | ORAL_TABLET | Freq: Every day | ORAL | Status: DC

## 2012-10-05 MED ORDER — LORAZEPAM 1 MG PO TABS
1.0000 mg | ORAL_TABLET | Freq: Once | ORAL | Status: DC
  Filled 2012-10-05: qty 1

## 2012-10-05 MED ORDER — LORAZEPAM 1 MG PO TABS: 1.0000 mg | ORAL_TABLET | Freq: Three times a day (TID) | ORAL | Status: DC | PRN

## 2012-10-05 MED ORDER — DEXTROSE 50 % IV SOLN
1.0000 | Freq: Once | INTRAVENOUS | Status: AC
  Administered 2012-10-05: 50 mL via INTRAVENOUS

## 2012-10-05 MED ORDER — HALOPERIDOL LACTATE 5 MG/ML IJ SOLN: 2.0000 mg | Freq: Two times a day (BID) | INTRAMUSCULAR | Status: DC

## 2012-10-05 NOTE — ED Notes (Signed)
One bag in locker 39 

## 2012-10-05 NOTE — ED Notes (Signed)
Patient made aware of need for urine. Patient unable to urinate at this time.

## 2012-10-05 NOTE — ED Notes (Signed)
Pt removed IV; uncooperative at time of restart; d50% given; will recheck cbg shortly

## 2012-10-05 NOTE — ED Notes (Addendum)
Pt transferred from [facility] to room 23. Pt undressed into hospital gown. Pt still hasn't spoke but was cooperative and allowing to be undressed from his clothes and into hospital gown.

## 2012-10-05 NOTE — ED Notes (Signed)
Per EMS, pt found catatonic-has been in the ED for same in the past.  Pt is non-verbal.

## 2012-10-05 NOTE — ED Provider Notes (Addendum)
History     CSN: [id number]  Arrival date & time 10/05/12  1318   First MD Initiated Contact with Patient 10/05/12 1411      Chief Complaint  Patient presents with  . Medical Clearance    (Consider location/radiation/quality/duration/timing/severity/associated sxs/prior treatment) The history is provided by the patient and the EMS personnel. The history is limited by the condition of the patient.  pt w hx catatonic behavior, psychosis, ?bipolar disorder w psychosis, presents via ems. Pt noted be withdrawn, catatonic. Pt not responding verbally to questions - level 5 caveat.      Past Medical History  Diagnosis Date  . Herniated disc     x3  . Hypertension   . Mental disorder   . Depression     Past Surgical History  Procedure Laterality Date  . Shoulder surgery      No family history on file.  History  Substance Use Topics  . Smoking status: Former Smoker -- 1.00 packs/day    Types: Cigarettes    Quit date: 04/04/2012  . Smokeless tobacco: Not on file  . Alcohol Use: No      Review of Systems  Unable to perform ROS: Psychiatric disorder  level 5 caveat  Allergies  Review of patient's allergies indicates no known allergies.  Home Medications   Current Outpatient Rx  Name  Route  Sig  Dispense  Refill  . benztropine (COGENTIN) 1 MG tablet   Oral   Take 1 tablet (1 mg total) by mouth at bedtime. For possible side effects.   30 tablet   0   . OLANZapine (ZYPREXA) 10 MG tablet   Oral   Take 1 tablet (10 mg total) by mouth at bedtime. For psychosis and depression.   30 tablet   0     BP 114/68  Pulse 68  Temp(Src) 0 F (-17.8 C)  Resp 16  SpO2 99%  Physical Exam  Nursing note and vitals reviewed. Constitutional: He appears well-developed and well-nourished. No distress.  HENT:  Head: Atraumatic.  Nose: Nose normal.  Mouth/Throat: Oropharynx is clear and moist.  Eyes: Conjunctivae are normal. Pupils are equal, round, and reactive to  light. No scleral icterus.  Neck: Neck supple. No tracheal deviation present.  No stiffness or rigidity  Cardiovascular: Normal rate, regular rhythm, normal heart sounds and intact distal pulses.   Pulmonary/Chest: Effort normal and breath sounds normal. No accessory muscle usage. No respiratory distress.  Abdominal: Soft. He exhibits no distension. There is no tenderness.  Musculoskeletal: Normal range of motion. He exhibits no edema and no tenderness.  Neurological: He is alert.  Alert, sitting upright, arms folded, stares forward. Makes purposeful movement w bil extremities but not cooperative w exam, not following commands, not verbally responsive  Skin: Skin is warm and dry. He is not diaphoretic.  Psychiatric:  Alert appearing, content. Catatonic.     ED Course  Procedures (including critical care time)     MDM  Labs.  Act team called.  Reviewed nursing notes and prior charts for additional history.   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   Ativan 1 mg po.   Labs still pending. Pt calm, alert. Act eval pending.   Signed out to Dr Ranae Palms to check labs when back, recheck pt, f/u with act team, dispo appropriately.        Suzi Roots, MD 10/05/12 1554  Suzi Roots, MD 10/11/12 [phone]

## 2012-10-05 NOTE — BH Assessment (Signed)
Assessment Note   Benjamin [name] is an 32 y.o. male. Patient with history of catatonic behavior presents to [facility] via EMS. Information given is limited as patient appears to be in a catatonic state. Writer attempted to assess patient, however; their was no eye contact or body movement. Patient laid in the hospital not making any eye contact. He did not answer any questions nor make any gestures to communicate. Unable to confirm/deny if patient is SI, HI, or experiencing any AVH's. Patient was recently hospitalized at [facility] due to the similar behaviors. Patients drug use is unknown as he will not provide details regarding use if any at all. He also refuses to provide a urine specimen, therefore; his UDS is incomplete.   Previous notes from [facility] assessment completed 09/06/2012. The following information was added in order to provide more details and insight of patients history and symptoms: "Pt was brought in via EMS accompanied by ex-girlfriend and was put under IVC by ED Physician. Per ED notes, ex-g/f reports pt has no longer maintains hygiene, isn't eating, and no longer spends time with his small children. She says he has lost 50 lbs in 3 mos and has quit driving. She reports he hasn't spoken in a couple of months. Pt reports he hasn't eaten for 29 days and has had any liquids in 10 days. He says his reason for fasting is"I'm trying to get focused. Be a better person". "I wanted a new start". Pt gives vague answers and provides no detail despite additional questioning. He denies SI and HI. He describes mood as "very peaceful" and his affect is blunted. He denies any depressive symptoms. Pt oriented to self but thinks it is January of 2013. 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 Pt reports VH during his time of fasting. He says he saw Jesus and "signs of what to do". When  asked if anyone was out to harm him, pt replies yes and "too much has gone on". When questioned further, pt says "no one gives straight answers" and "you can move from state to state and the same things happen". He says he last showered one week ago. Pt reports no hx of outpatient MH treatment."   Axis I: Psychotic Disorder NOS Axis II: Deferred Axis III:  Past Medical History  Diagnosis Date  . Herniated disc     x3  . Hypertension   . Mental disorder   . Depression    Axis IV: other psychosocial or environmental problems, problems related to social environment, problems with access to health care services and problems with primary support group Axis V: 21-30 behavior considerably influenced by delusions or hallucinations OR serious impairment in judgment, communication OR inability to function in almost all areas  Past Medical History:  Past Medical History  Diagnosis Date  . Herniated disc     x3  . Hypertension   . Mental disorder   . Depression     Past Surgical History  Procedure Laterality Date  . Shoulder surgery      Family History: No family history on file.  Social History:  reports that he quit smoking about 6 months ago. His smoking use included Cigarettes. He smoked 1.00 pack per day. He does not have any smokeless tobacco history on file. He reports that he does not drink alcohol or use illicit drugs.  Additional Social History:  Alcohol / Drug Use Pain Medications: SEE MAR Prescriptions: SEE MAR Over the Counter: SEE MAR History of alcohol / drug use?: Yes Substance #1 Name of Substance 1: Patient appears catatonic and will not respond when asked about alcohol or drug use. Prevous notes from 09/06/2012 indicate that patient has a history of using bath salts and synthetic THC.  1 - Age of First Use: n/a 1 - Amount (size/oz): n/a 1 - Frequency: n/a 1 - Duration: n/a 1 - Last Use / Amount: prevous notes state that patient has not used bath salts or synthetic  marijuana since 04/2012.  CIWA: CIWA-Ar BP: 114/68 mmHg Pulse Rate: 68 COWS:    Allergies: No Known Allergies  Home Medications:  (Not in a hospital admission)  OB/GYN Status:  No LMP for male patient.  General Assessment Data Location of Assessment: [facility] Living Arrangements: Alone;Other (Comment) (per previous notes) Can pt return to current living arrangement?: Yes Admission Status: Involuntary Is patient capable of signing voluntary admission?: Yes Transfer from: Acute Hospital Referral Source: Self/Family/Friend  Education Status Is patient currently in school?: No  Risk to self Suicidal Ideation: No (unable to determine; pt is in a catatonic state) Suicidal Intent: No Is patient at risk for suicide?: No Suicidal Plan?: No Access to Means: No What has been your use of drugs/alcohol within the last 12 months?:  (n/a) Previous Attempts/Gestures:  (unk; patient is in a catatonic state, none noted in hx) How many times?:  (0) Other Self Harm Risks:  (n/a) Triggers for Past Attempts: Other (Comment) (n/a) Intentional Self Injurious Behavior: None Family Suicide History: No;See progress notes (patient unable to respond he is in a catatonic state) Recent stressful life event(s): Other (Comment) (unk; patient does not provide a answer) Persecutory voices/beliefs?: No Depression: No (patient does not provide a response; he is in a catatonic st) Depression Symptoms:  (none reported nor observed) Substance abuse history and/or treatment for substance abuse?: No Suicide prevention information given to non-admitted patients: Not applicable  Risk to Others Homicidal Ideation: No (patient unable to confirm or deny; he is in a catatonic stat) Thoughts of Harm to Others: No Current Homicidal Intent: No Current Homicidal Plan: No Access to Homicidal Means: No Identified Victim:  (none reported) History of harm to others?: No Assessment of Violence: None Noted Violent Behavior  Description:  (patient is calm and cooperative) Does patient have access to weapons?: No Criminal Charges Pending?: No Does patient have a court date: No  Psychosis Hallucinations: None noted (unable to determine; pt does not provide a answer) Delusions: None noted (prevous hx of visual and persecutory dellusions)  Mental Status Report Appear/Hygiene: Disheveled Eye Contact: Poor Motor Activity: Unable to assess (p does not move during the assessment; catatonic) Speech: Elective mutism Level of Consciousness: Quiet/awake Mood: Other (Comment) (patient has elective mutism and unable to respond) Affect: Blunted Anxiety Level: None Thought Processes: Coherent Judgement: Impaired Orientation: Unable to assess (patient is in a catatonic state) Obsessive Compulsive Thoughts/Behaviors: None  Cognitive Functioning Concentration: Decreased Memory: Recent Impaired;Remote Impaired IQ: Average Insight: Poor Impulse Control:  (unable to determine) Appetite:  (unable to determine) Weight Loss:  (none reported) Weight Gain:  (none reported) Sleep:  (unable to determine) Total Hours of Sleep:  (unk) Vegetative Symptoms: None  ADLScreening [facility]) Patient's cognitive ability adequate to safely complete daily activities?: No Patient able to express need for assistance with ADLs?: No Independently performs ADLs?: No  Abuse/Neglect [facility]) Physical Abuse:  (unk; patient doesn't  respond to questions asked) Verbal Abuse:  (unk; patient does not respond to questions asked) Sexual Abuse: Denies (unk; patient does not respond to questions asked)  Prior Inpatient Therapy Prior Inpatient Therapy: Yes Prior Therapy Dates:  (patient unable to provide; per  EPIC notes 09/2012) Prior Therapy Facilty/Hosanna Betley(s): [facility] ([facility]; [facility] 09/2012) Reason for Treatment: treatment for alcohol and methadone  Prior Outpatient Therapy Prior Outpatient  Therapy: No Prior Therapy Dates: na Prior Therapy Facilty/Maudry Zeidan(s): na Reason for Treatment: na  ADL Screening (condition at time of admission) Patient's cognitive ability adequate to safely complete daily activities?: No Patient able to express need for assistance with ADLs?: No Independently performs ADLs?: No Communication:  (Patient is in a catotonic state and not responding.) Dressing (OT): Needs assistance (Per ED notes staff assisted pt with changing upon arrival) Feeding: Independent Bathing:  (unk; patient is in a catotonic state; unable to provide info) Toileting: Independent (per previous notes patient ambulated to the bathroom ok) In/Out Bed: Independent (no issues noted by nurse or EDP) Walks in Home: Independent (no issues noted by nurse or EDP) Weakness of Legs: None Weakness of Arms/Hands: None  Home Assistive Devices/Equipment Home Assistive Devices/Equipment: None    Abuse/Neglect Assessment (Assessment to be complete while patient is alone) Physical Abuse:  (unk; patient doesn't respond to questions asked) Verbal Abuse:  (unk; patient does not respond to questions asked) Sexual Abuse: Denies (unk; patient does not respond to questions asked) Exploitation of patient/patient's resources: Denies Self-Neglect: Denies Values / Beliefs Cultural Requests During Hospitalization: None Spiritual Requests During Hospitalization: None Consults Spiritual Care Consult Needed: No Social Work Consult Needed: No Merchant navy officer (For Healthcare) Advance Directive: Patient does not have advance directive (patient unable to respond when asked due to current mental h) Nutrition Screen- [facility] Adult/[facility] Patient's home diet: Regular Have you recently lost weight without trying?: Patient is unsure (patient does not respond as he appears to be catatonic) Have you been eating poorly because of a decreased appetite?:  (patient does not respond to this question but has a hx  )  Additional Information 1:1 In Past 12 Months?: No CIRT Risk: No Elopement Risk: No Does patient have medical clearance?: Yes     Disposition:  Disposition Initial Assessment Completed: Yes Disposition of Patient: Inpatient treatment program Type of inpatient treatment program: Adult  On Site Evaluation by:   Reviewed with Physician:     Octaviano Batty 10/05/2012 7:30 PM

## 2012-10-05 NOTE — ED Notes (Signed)
Patient refused blood draw for labs.RN Carrie made aware 

## 2012-10-05 NOTE — Treatment Plan (Addendum)
Pt is declined by [facility] administration secondary to malingering.  Please contact pt's mother so she may come pick him up as she has been looking for him.  Her name is Sports administrator and her telephone number is [phone]

## 2012-10-05 NOTE — ED Provider Notes (Signed)
Agree with IM dosing of medication per psychiatry recommendations   Loren Racer, MD 10/05/12 [phone]

## 2012-10-05 NOTE — Consult Note (Signed)
Reason for Consult: Catatonia and schizophrenia questionable noncompliant with medication Referring Physician: Dr. [name] is an 32 y.o. male.  HPI: Patient was seen and chart reviewed. Patient was known to this provider from his previous Benjamin [name] long emergency visits. Patient was unable to verbally respond unable to hold hand in a and has slow motion of uncrossing his legs.   MSE: Patient was catatonic lying down on his back eyes were open but not falling the finger dissection. He has a no or limited response to the verbal stimuli. Patient does not have any distress.  Past Medical History  Diagnosis Date  . Herniated disc     x3  . Hypertension   . Mental disorder   . Depression     Past Surgical History  Procedure Laterality Date  . Shoulder surgery      No family history on file.  Social History:  reports that he quit smoking about 6 months ago. His smoking use included Cigarettes. He smoked 1.00 pack per day. He does not have any smokeless tobacco history on file. He reports that he does not drink alcohol or use illicit drugs.  Allergies: No Known Allergies  Medications: I have reviewed the patient's current medications.  No results found for this or any previous visit (from the past 48 hour(s)).  No results found.  Positive for catatonia Blood pressure 114/68, pulse 68, temperature 0 F (-17.8 C), resp. rate 16, SpO2 99.00%.   Assessment/Plan: Catatonic schizophrenia, recurrence Questionable compliance with medication  Recommended forced medication Haldol 2 mg IM or by mouth twice daily and Ativan 1 mg intramuscular 2 times daily for catatonia. Patient needed acute psychiatric hospitalization for crisis stabilization and treatment. Spoke with doctor Ranae Palms who agreed to second opinion for forced medication.  [name],Benjamin R. 10/05/2012, 5:59 PM

## 2012-10-05 NOTE — ED Notes (Signed)
Staff attempted to take pt's vitals and was not successful because pt refused to have vitals taken.

## 2012-10-06 DIAGNOSIS — F39 Unspecified mood [affective] disorder: Secondary | ICD-10-CM

## 2012-10-06 NOTE — Clinical Social Work Note (Signed)
CSW spoke to pt mom, [place]oy [place] at [phone].  Mom very anxious and afraid that pt had committed suicide.  Eber Jones stated pt dad committed suicide aprox 12 years ago.  Carolynn has not heard from pt in aprox 1 month.  Per mom, pt is from [place], visiting/moved to [place] to be with his 2 small children.  Pt mother reports relationship between children's mom and pt is toxic.  Mom states she is willing to accept responsibility of pt care if/when medically/psychiatrically stable to d/c.  Pt mom states that she will support with housing, transportation, f/u psychiatric care, whatever the pt needs.  Eber Jones requests pt to call her.  CSW will relay this message.  Pt mother thankful and appreciative of CSW call to confirm pt's whereabouts.    Vickii Penna, LCSWA [phone]  Clinical Social Work

## 2012-10-06 NOTE — ED Notes (Signed)
Pt awake and alert this morning, pt able to verbalize needs and has asked for juice, milk, peanut butter and crackers. Pt received medications without incident. Pt has been up to use the bathroom and has not verbalized any complaints.

## 2012-10-06 NOTE — Clinical Social Work Note (Signed)
CSW faxed pt information for review to:  [facility] ph: [phone]  [place] ph: [phone]  [place] ph: [phone]  [facility] Fetter ph: [phone]  Yvetta Coder [phone]   The following hospitals did not have beds available:  [facility] ph: [phone]  [facility] ph: [phone]  [facility] ph: [phone]  [place]: [phone]  [facility] ph: [phone]  [facility] ph: [phone]  [facility] ph: [phone]  Vickii Penna, LCSWA [phone]  Clinical Social Work

## 2012-10-06 NOTE — Clinical Social Work Note (Signed)
CSW assessed pt at bedside.  Pt sat up and was speaking to this CSW.  Pt was alert, appropriate and oritented x4.  Pt provided clear insight into his situation and spoke about his future as hopeful in "turning around".  Pt stated that he "was against" medication until he realized the meds given to him last night and this morning "worked".  Pt verbalized the meds may be a little strong bec pt stated "they knocked me out".  Pt stated that his mother was a great support for him and may be a good d/c plan for him.  Pt stated he was willing to go to [place] and f/u with outpatient psychiatric care.  Pt stated that his "whole life is turned upside down".  Though he only gave CSW very little insight into what he considers to be "turned upside down", the pt provided positive insight and reasoning into his situation.  CSW encouraged pt to concentrate on the smaller picture vs. The grand picture of all his problems.  Pt agreeable that it would help to concentrate on his greatest need: self-care, being compliant with his meds, obtaining stability in housing/employment.  CSW gave pt his mom's phone number and encouraged pt to call.  Pt excited to have this information and thanked CSW for everything.  Vickii Penna, LCSWA [phone]  Clinical Social Work

## 2012-10-06 NOTE — ED Notes (Signed)
Patient talking time he came into department. Patient asking for milk and grape juice. Patient responds appropriately to writers questions.

## 2012-10-06 NOTE — Consult Note (Signed)
Reason for Consult: Catatonia noncompliance and schizophrenia Referring Physician: Dr. Shon Hough is an 32 y.o. male.  HPI: Patient was appeared sitting in his bed and eating his snack drinking his milk. Patient was able to communicate well without having difficulties and he has no symptoms of full catatonia or psychotic symptoms. Patient reported his medications helping him especially at bedtime but to haldol making him sedated. Patient stated the left to the behavioral health hospital and went to the motel and staying there and started fasting to complete 40 days of fasting which he started earlier. He is not taking his medication since he discharged from the hospital. Patient reported he does not believing in his medication but now willing to take his medication which are helping him. Patient stated he broke up with his four years old daughter's mother about few months ago and not able to Access the home when he went back. Patient has a history for polysubstance abuse versus dependence. Reportedly smoking legal herbal chemical. Patient was never received outpatient psychiatry services from the [facility] as scheduled or planned.   MSE: Patient appeared skinny young Caucasian male who is disheveled and unshaven beard. Patient stated mood is some feeling better mouth and his affect was appropriate bright. Patient has normal rate rhythm and volume of speech his thought process linear and goal-directed. Patient has no suicidal onset ideation or evidence of psychotic symptoms.  Past Medical History  Diagnosis Date  . Herniated disc     x3  . Hypertension   . Mental disorder   . Depression     Past Surgical History  Procedure Laterality Date  . Shoulder surgery      No family history on file.  Social History:  reports that he quit smoking about 6 months ago. His smoking use included Cigarettes. He smoked 1.00 pack per day. He does not have any smokeless tobacco history on file. He  reports that he does not drink alcohol or use illicit drugs.  Allergies: No Known Allergies  Medications: I have reviewed the patient's current medications.  Results for orders placed during the hospital encounter of 10/05/12 (from the past 48 hour(s))  CBC     Status: None   Collection Time    10/05/12  5:55 PM      Result Value Range   WBC 6.3  4.0 - 10.5 K/uL   RBC 4.90  4.22 - 5.81 MIL/uL   Hemoglobin 15.6  13.0 - 17.0 g/dL   HCT 16.1  09.6 - 04.5 %   MCV 91.6  78.0 - 100.0 fL   MCH 31.8  26.0 - 34.0 pg   MCHC 34.7  30.0 - 36.0 g/dL   RDW 40.9  81.1 - 91.4 %   Platelets 198  150 - 400 K/uL  COMPREHENSIVE METABOLIC PANEL     Status: Abnormal   Collection Time    10/05/12  5:55 PM      Result Value Range   Sodium 142  135 - 145 mEq/L   Potassium 3.8  3.5 - 5.1 mEq/L   Chloride 102  96 - 112 mEq/L   CO2 23  19 - 32 mEq/L   Glucose, Bld 63 (*) 70 - 99 mg/dL   BUN 24 (*) 6 - 23 mg/dL   Creatinine, Ser 7.82  0.50 - 1.35 mg/dL   Calcium 9.7  8.4 - 95.6 mg/dL   Total Protein 7.6  6.0 - 8.3 g/dL   Albumin 4.2  3.5 -  5.2 g/dL   AST 14  0 - 37 U/L   ALT 18  0 - 53 U/L   Alkaline Phosphatase 41  39 - 117 U/L   Total Bilirubin 0.6  0.3 - 1.2 mg/dL   GFR calc non Af Amer >90  >90 mL/min   GFR calc Af Amer >90  >90 mL/min   Comment:            The eGFR has been calculated     using the CKD EPI equation.     This calculation has not been     validated in all clinical     situations.     eGFR's persistently     <90 mL/min signify     possible Chronic Kidney Disease.  ETHANOL     Status: None   Collection Time    10/05/12  5:55 PM      Result Value Range   Alcohol, Ethyl (B) <11  0 - 11 mg/dL   Comment:            LOWEST DETECTABLE LIMIT FOR     SERUM ALCOHOL IS 11 mg/dL     FOR MEDICAL PURPOSES ONLY  GLUCOSE, CAPILLARY     Status: Abnormal   Collection Time    10/05/12  9:12 PM      Result Value Range   Glucose-Capillary 182 (*) 70 - 99 mg/dL  URINE RAPID DRUG SCREEN  (HOSP PERFORMED)     Status: None   Collection Time    10/05/12  9:49 PM      Result Value Range   Opiates NONE DETECTED  NONE DETECTED   Cocaine NONE DETECTED  NONE DETECTED   Benzodiazepines NONE DETECTED  NONE DETECTED   Amphetamines NONE DETECTED  NONE DETECTED   Tetrahydrocannabinol NONE DETECTED  NONE DETECTED   Barbiturates NONE DETECTED  NONE DETECTED   Comment:            DRUG SCREEN FOR MEDICAL PURPOSES     ONLY.  IF CONFIRMATION IS NEEDED     FOR ANY PURPOSE, NOTIFY LAB     WITHIN 5 DAYS.                LOWEST DETECTABLE LIMITS     FOR URINE DRUG SCREEN     Drug Class       Cutoff (ng/mL)     Amphetamine      1000     Barbiturate      200     Benzodiazepine   200     Tricyclics       300     Opiates          300     Cocaine          300     THC              50    No results found.  Positive for anorexia, anxiety, bad mood, behavior problems, illegal drug usage, sleep disturbance and Catatonia. Questionable malingering Blood pressure 108/70, pulse 78, temperature 98 F (36.7 C), temperature source Oral, resp. rate 16, SpO2 98.00%.   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 Patient does not meet criteria for acute psychiatric hospitalization at this time. Patient will be discharged as soon as disposition plans were made appropriately, social services is working  with the patient mother who was supportive and lives in [place]. No medication changes made during this visit.   Amalio Loe,JANARDHAHA R. 10/06/2012, 4:19 PM

## 2012-10-06 NOTE — Progress Notes (Addendum)
CSW met with pt at bedside to discuss pt discharge plans. Pt shared that he is interested in going back home to [place] and staying with patient mother.   Pt stated that he realized the importance of having medications and fasting in the future that allowed him to continue to remain compliant with medications. Pt reports that he feels better on the medications because they scare him, however the other paranoid thoughts he experienced before were worse. Pt reports that if God asks him to fast again, he will however hopes to have a fasting plan that allows him to remain stable and healthy.   Pt reported that he had not spoken to his mother and stated that he wanted to talk to her tomorrow. Pt gave CSW permission to speak with mom regarding a discharge plan for tomorrow. CSW attempted 4x to reach pt mother however no answer and unable to leave a message.  CSW will continue to try to reach patient mother in order to help with an appropriate discharge plan.   CSW spoke with [facility] regarding patient returning to [place] and needing follow up care. Day CSW to call in the morning to schedule a discharge appointment in order for patient to have follow up psychiatric care and medication assistance in [place].   Catha Gosselin, LCSWA  [phone] .10/06/2012 1758pm

## 2012-10-06 NOTE — ED Provider Notes (Signed)
Patient seen in [facility] ED.  Patient is sleeping, resting comfortably without issues.  Awaiting repeat psychiatric evaluation to determine disposition/need for placement.   Gilda Crease, MD 10/06/12 [phone]

## 2012-10-07 NOTE — Progress Notes (Signed)
Patient discharged home to patient mother. Pt plans to follow up with previous psychiatric outpatient resources that patient mother is very familiar with. CSW and pt also discuss [facility] center that can assist with medications. Patient has 2 week supply of medications from previous [facility] admission and prescription with discharge medications. Pt transportation provided by [facility].   .No further Clinical Social Work needs, signing off.   Catha Gosselin, LCSWA  [phone] .10/07/2012 1705pm

## 2012-10-07 NOTE — Clinical Social Work Note (Addendum)
2:11pm- CSW received a call from [facility] stating their pickup time will be between 2:45pm and 3pm.  CSW made RN and EDP aware.    1:20pm- [facility] requested CSW to email the transportation request to [email or web address].  CSW fulfilled this request.  CSW is waiting on a call back from [facility] to advise of departure time.  12:54pm- CSW met with pt at bedside to confirm d/c plans to [place].  Pt requests to be transferred to [place], but states he has no money or means to get there.  CSW stated that we would be assisting with this need, but advised this assistance is only available this one time.  Pt was agreeable and acknowledged understanding.  CSW called [facility] at [phone] to request transportation.  Chanetta Marshall will need to speak to his accountant and call me back regarding the billing process.     CSW spoke with pt at bedside re: disposition.  CSW informed pt that pt will soon be up for d/c and wanted to make sure that we have appropriate d/c plans in place for the pt.  Per Chief Executive Officer, CSW department is willing to transport the pt to [employer] via [employer].   CSW will continue to assist with these plans.  Vickii Penna, LCSWA [phone]  Clinical Social Work

## 2012-10-07 NOTE — ED Provider Notes (Signed)
8:18 AM Patient sleeping on AM rounds.  Per RN, there were no notable events overnight.  He continues to have catatonia-like behavior.  Filed Vitals:   10/07/12 0540  BP: 99/53  Pulse: 65  Temp: 97.7 F (36.5 C)  Resp: 18     The patient's plan is being evaluated by SW and his mother. Dispo pending.  Gerhard Munch, MD 10/07/12 [phone]

## 2012-10-07 NOTE — BHH Counselor (Signed)
The mother of patients children called stating, "I need to speak with Ivin Booty.Marland KitchenMarland KitchenWe need to discuss his legal issues and these children". She was informed that phone calls start at 9am and he would be given the message to call her asap.  The mother of patients children also stated that patients mother wanted to speak with him. Writer asked her to notify patients mother and ask her to call ACT or SW regarding patients discharge arrangements. SW has made multiple calls to get in touch with patients mother with no success.

## 2012-10-07 NOTE — Clinical Social Work Note (Addendum)
12:35pm - CSW attempted to call pt mom for a 4th time (number listed below).  Carolyn answered.  CSW transferred call to nurse's station where pt could speak to mother about coming to [place].    11:02am- CSW attempted for a 3rd time to contact mom at the number listed below.  Not able to leave message.  ACT team also attempted to contact mom- no success.  9:17am- CSW attempted for a 2nd time to contact mom at the number listed below.  Not able to leave message.   CSW attempted to contact mom at [phone].  There is no option to leave a message.  CSW will continue to f/u with mom and pt re: d/c plan.  Vickii Penna, LCSWA [phone]  Clinical Social Work

## 2016-07-08 ENCOUNTER — Emergency Department (HOSPITAL_COMMUNITY)
Admission: EM | Admit: 2016-07-08 | Discharge: 2016-07-11 | Disposition: A | Discharge status: Home / Self Care | Payer: No Typology Code available for payment source | Attending: Emergency Medicine | Admitting: Emergency Medicine

## 2016-07-08 DIAGNOSIS — Z79899 Other long term (current) drug therapy: Secondary | ICD-10-CM | POA: Insufficient documentation

## 2016-07-08 DIAGNOSIS — F333 Major depressive disorder, recurrent, severe with psychotic symptoms: Secondary | ICD-10-CM | POA: Diagnosis present

## 2016-07-08 DIAGNOSIS — F061 Catatonic disorder due to known physiological condition: Secondary | ICD-10-CM | POA: Diagnosis present

## 2016-07-08 DIAGNOSIS — F339 Major depressive disorder, recurrent, unspecified: Secondary | ICD-10-CM

## 2016-07-08 DIAGNOSIS — R401 Stupor: Secondary | ICD-10-CM | POA: Diagnosis present

## 2016-07-08 DIAGNOSIS — F332 Major depressive disorder, recurrent severe without psychotic features: Secondary | ICD-10-CM

## 2016-07-08 DIAGNOSIS — I10 Essential (primary) hypertension: Secondary | ICD-10-CM | POA: Insufficient documentation

## 2016-07-08 DIAGNOSIS — Z87891 Personal history of nicotine dependence: Secondary | ICD-10-CM | POA: Insufficient documentation

## 2016-07-08 NOTE — ED Triage Notes (Signed)
BP 122/78, HR 60 BPM O2 Sat 95% room air CBG 77 pupils PERRLA. Pt no responsive to verbal pt had made previous statement to family member about death and the after life current in semi catatonic state and has minimal response to all stimuli.

## 2016-07-08 NOTE — ED Notes (Signed)
Bed: [id number] Expected date:  Expected time:  Means of arrival:  Comments: EMS  IVC

## 2016-07-09 ENCOUNTER — Encounter (HOSPITAL_COMMUNITY): Payer: Self-pay | Admitting: Emergency Medicine

## 2016-07-09 ENCOUNTER — Inpatient Hospital Stay (HOSPITAL_COMMUNITY): Admission: AD | Admit: 2016-07-09 | Payer: No Typology Code available for payment source | Admitting: Psychiatry

## 2016-07-09 DIAGNOSIS — F332 Major depressive disorder, recurrent severe without psychotic features: Secondary | ICD-10-CM | POA: Diagnosis not present

## 2016-07-09 DIAGNOSIS — Z9889 Other specified postprocedural states: Secondary | ICD-10-CM | POA: Diagnosis not present

## 2016-07-09 DIAGNOSIS — F061 Catatonic disorder due to known physiological condition: Secondary | ICD-10-CM | POA: Diagnosis not present

## 2016-07-09 DIAGNOSIS — R401 Stupor: Secondary | ICD-10-CM | POA: Diagnosis present

## 2016-07-09 DIAGNOSIS — Z87891 Personal history of nicotine dependence: Secondary | ICD-10-CM | POA: Diagnosis not present

## 2016-07-09 LAB — COMPREHENSIVE METABOLIC PANEL
ALK PHOS: 41 U/L (ref 38–126)
ALT: 26 U/L (ref 17–63)
ANION GAP: 13 (ref 5–15)
AST: 22 U/L (ref 15–41)
Albumin: 5.3 g/dL — ABNORMAL HIGH (ref 3.5–5.0)
BUN: 25 mg/dL — ABNORMAL HIGH (ref 6–20)
CALCIUM: 9.9 mg/dL (ref 8.9–10.3)
CHLORIDE: 106 mmol/L (ref 101–111)
CO2: 22 mmol/L (ref 22–32)
CREATININE: 0.94 mg/dL (ref 0.61–1.24)
Glucose, Bld: 82 mg/dL (ref 65–99)
Potassium: 3.7 mmol/L (ref 3.5–5.1)
SODIUM: 141 mmol/L (ref 135–145)
Total Bilirubin: 1.3 mg/dL — ABNORMAL HIGH (ref 0.3–1.2)
Total Protein: 8.3 g/dL — ABNORMAL HIGH (ref 6.5–8.1)

## 2016-07-09 LAB — RAPID URINE DRUG SCREEN, HOSP PERFORMED
AMPHETAMINES: NOT DETECTED
BENZODIAZEPINES: NOT DETECTED
Barbiturates: NOT DETECTED
COCAINE: NOT DETECTED
Opiates: NOT DETECTED
Tetrahydrocannabinol: POSITIVE — AB

## 2016-07-09 LAB — URINALYSIS, ROUTINE W REFLEX MICROSCOPIC
BACTERIA UA: NONE SEEN
Bilirubin Urine: NEGATIVE
Glucose, UA: NEGATIVE mg/dL
Hgb urine dipstick: NEGATIVE
KETONES UR: 80 mg/dL — AB
Leukocytes, UA: NEGATIVE
Nitrite: NEGATIVE
PROTEIN: 30 mg/dL — AB
SQUAMOUS EPITHELIAL / LPF: NONE SEEN
Specific Gravity, Urine: 1.03 (ref 1.005–1.030)
pH: 5 (ref 5.0–8.0)

## 2016-07-09 LAB — I-STAT CHEM 8, ED
BUN: 24 mg/dL — ABNORMAL HIGH (ref 6–20)
BUN: 20 mg/dL (ref 6–20)
CALCIUM ION: 1.22 mmol/L (ref 1.15–1.40)
CREATININE: 0.9 mg/dL (ref 0.61–1.24)
Calcium, Ion: 1.24 mmol/L (ref 1.15–1.40)
Chloride: 105 mmol/L (ref 101–111)
Chloride: 108 mmol/L (ref 101–111)
Creatinine, Ser: 0.9 mg/dL (ref 0.61–1.24)
GLUCOSE: 82 mg/dL (ref 65–99)
Glucose, Bld: 78 mg/dL (ref 65–99)
HCT: 49 % (ref 39.0–52.0)
HEMATOCRIT: 50 % (ref 39.0–52.0)
HEMOGLOBIN: 17 g/dL (ref 13.0–17.0)
HEMOGLOBIN: 16.7 g/dL (ref 13.0–17.0)
POTASSIUM: 3.7 mmol/L (ref 3.5–5.1)
Potassium: 4.1 mmol/L (ref 3.5–5.1)
SODIUM: 145 mmol/L (ref 135–145)
Sodium: 145 mmol/L (ref 135–145)
TCO2: 22 mmol/L (ref 0–100)
TCO2: 20 mmol/L (ref 0–100)

## 2016-07-09 LAB — CBC WITH DIFFERENTIAL/PLATELET
Basophils Absolute: 0 10*3/uL (ref 0.0–0.1)
Basophils Relative: 0 %
EOS ABS: 0.1 10*3/uL (ref 0.0–0.7)
EOS PCT: 1 %
HCT: 50.4 % (ref 39.0–52.0)
Hemoglobin: 17.4 g/dL — ABNORMAL HIGH (ref 13.0–17.0)
LYMPHS ABS: 1.7 10*3/uL (ref 0.7–4.0)
LYMPHS PCT: 17 %
MCH: 30.9 pg (ref 26.0–34.0)
MCHC: 34.5 g/dL (ref 30.0–36.0)
MCV: 89.5 fL (ref 78.0–100.0)
MONOS PCT: 6 %
Monocytes Absolute: 0.6 10*3/uL (ref 0.1–1.0)
Neutro Abs: 7.9 10*3/uL — ABNORMAL HIGH (ref 1.7–7.7)
Neutrophils Relative %: 76 %
PLATELETS: 244 10*3/uL (ref 150–400)
RBC: 5.63 MIL/uL (ref 4.22–5.81)
RDW: 12.8 % (ref 11.5–15.5)
WBC: 10.3 10*3/uL (ref 4.0–10.5)

## 2016-07-09 LAB — ETHANOL

## 2016-07-09 LAB — ACETAMINOPHEN LEVEL

## 2016-07-09 LAB — SALICYLATE LEVEL

## 2016-07-09 MED ORDER — LORAZEPAM 2 MG/ML IJ SOLN
2.0000 mg | Freq: Once | INTRAMUSCULAR | Status: AC
  Administered 2016-07-09: 2 mg via INTRAMUSCULAR
  Filled 2016-07-09: qty 1

## 2016-07-09 MED ORDER — ONDANSETRON HCL 4 MG PO TABS
4.0000 mg | ORAL_TABLET | Freq: Three times a day (TID) | ORAL | Status: DC | PRN
Start: 2016-07-09 — End: 2016-07-11

## 2016-07-09 MED ORDER — DEXTROSE 5 % AND 0.45 % NACL IV BOLUS
1000.0000 mL | Freq: Once | INTRAVENOUS | Status: AC
  Administered 2016-07-09: 1000 mL via INTRAVENOUS

## 2016-07-09 MED ORDER — OLANZAPINE 10 MG PO TABS: 10.0000 mg | ORAL_TABLET | Freq: Every day | ORAL | Status: DC

## 2016-07-09 MED ORDER — LORAZEPAM 2 MG/ML IJ SOLN
2.0000 mg | INTRAMUSCULAR | Status: DC
  Administered 2016-07-09: 2 mg via INTRAVENOUS
  Filled 2016-07-09: qty 1

## 2016-07-09 MED ORDER — NALOXONE HCL 2 MG/2ML IJ SOSY
2.0000 mg | PREFILLED_SYRINGE | Freq: Once | INTRAMUSCULAR | Status: DC
  Filled 2016-07-09: qty 2

## 2016-07-09 MED ORDER — OLANZAPINE 5 MG PO TABS: 5.0000 mg | ORAL_TABLET | Freq: Two times a day (BID) | ORAL | Status: DC

## 2016-07-09 MED ORDER — ALUM & MAG HYDROXIDE-SIMETH 200-200-20 MG/5ML PO SUSP: 30.0000 mL | ORAL | Status: DC | PRN

## 2016-07-09 MED ORDER — LORAZEPAM 0.5 MG PO TABS: 0.5000 mg | ORAL_TABLET | Freq: Three times a day (TID) | ORAL | Status: DC

## 2016-07-09 MED ORDER — DEXTROSE-NACL 5-0.45 % IV SOLN
INTRAVENOUS | Status: DC
  Administered 2016-07-09: 16:00:00 via INTRAVENOUS

## 2016-07-09 MED ORDER — AMMONIA AROMATIC IN INHA
RESPIRATORY_TRACT | Status: AC
  Administered 2016-07-09: 01:00:00
  Filled 2016-07-09: qty 20

## 2016-07-09 MED ORDER — ZOLPIDEM TARTRATE 5 MG PO TABS: 5.0000 mg | ORAL_TABLET | Freq: Every evening | ORAL | Status: DC | PRN

## 2016-07-09 MED ORDER — LORAZEPAM 1 MG PO TABS
2.0000 mg | ORAL_TABLET | ORAL | Status: DC
Start: 2016-07-09 — End: 2016-07-10
  Administered 2016-07-09 – 2016-07-10 (×2): 2 mg via ORAL
  Filled 2016-07-09 (×2): qty 2

## 2016-07-09 MED ORDER — IBUPROFEN 200 MG PO TABS: 600.0000 mg | ORAL_TABLET | Freq: Three times a day (TID) | ORAL | Status: DC | PRN

## 2016-07-09 MED ORDER — BENZTROPINE MESYLATE 1 MG PO TABS: 1.0000 mg | ORAL_TABLET | Freq: Every day | ORAL | Status: DC

## 2016-07-09 MED ORDER — LORAZEPAM 2 MG/ML IJ SOLN: 2.0000 mg | INTRAMUSCULAR | Status: DC

## 2016-07-09 MED ORDER — ARTIFICIAL TEARS OP OINT
TOPICAL_OINTMENT | Freq: Once | OPHTHALMIC | Status: AC
  Administered 2016-07-09: 16:00:00 via OPHTHALMIC
  Filled 2016-07-09: qty 3.5

## 2016-07-09 MED ORDER — ACETAMINOPHEN 325 MG PO TABS: 650.0000 mg | ORAL_TABLET | ORAL | Status: DC | PRN

## 2016-07-09 MED ORDER — SODIUM CHLORIDE 0.9 % IV BOLUS (SEPSIS)
1000.0000 mL | Freq: Once | INTRAVENOUS | Status: AC
  Administered 2016-07-09: 1000 mL via INTRAVENOUS

## 2016-07-09 MED ORDER — CITALOPRAM HYDROBROMIDE 10 MG PO TABS: 10.0000 mg | ORAL_TABLET | Freq: Every day | ORAL | Status: DC

## 2016-07-09 NOTE — ED Notes (Signed)
Pt attempted to be verbal. Responded to voice. Pt able to use urinal by self.

## 2016-07-09 NOTE — Progress Notes (Signed)
ED CM left pt uninsured [place] resources in his locker #32 CM spoke with pt who confirms uninsured [place] resident with no pcp.  CM provided written information to assist pt with determining choice for uninsured accepting pcps, discussed the importance of pcp vs EDP services for f/u care, www.needymeds.org, www.goodrx.com, discounted pharmacies and other [place] resources such as [facility] , [facility], affordable care act, financial assistance, uninsured dental services, [place] med assist, DSS and  health department  Provided resources for [place] uninsured accepting pcps like [facility], family medicine at [address], [facility], [facility], local urgent care centers, [facility], [facility], general medical clinics, [facility], [facility] plus others, medication resources, [facility] out patient pharmacies and housing Provided [facility] contact information  

## 2016-07-09 NOTE — BHH Counselor (Signed)
Dr. Nicanor AlconPalumbo was in agreement that a CPS report needed to be made. Clinician called CPS after hours abuse hotline to make a report. Clinician received a call back from [place], at DSS to make the report. The CPS reported was made because in the IVC paperwork, it noted that the pt has not been tending to his children who are 4 & 8 and the children has been taking care of themselves for the past two days. Lannette DonathKacie wanted the office number in the event she had additional questions about the children.   Gwinda Passereylese D Bennett, MS, [facility], [facility] Triage Specialist [phone]

## 2016-07-09 NOTE — ED Notes (Addendum)
Pt came in catatonic. Pt is now blinking on own, and occasionally moves extremities. Still non-verbal, and not eating/ drinking. Pt calm, NAD noted. Vitals stable.

## 2016-07-09 NOTE — BH Assessment (Signed)
BHH Assessment Progress Note  Per Thedore MinsMojeed Akintayo, MD, this pt requires psychiatric hospitalization.  Lillia AbedLindsay, RN, [id number]; the bed is currently occupied, and Lillia AbedLindsay will call when it is available.  Pt presents under IVC which Dr Jannifer FranklinAkintayo has upheld, and [facility].  Pt's nurse, Verlon AuLeslie, has been notified, and agrees to call report to [phone] when the time comes.  Pt is to be transported via [employer].   Doylene Canninghomas Maudine Kluesner, MA Triage Specialist [phone]

## 2016-07-09 NOTE — BHH Counselor (Signed)
Benjamin [name] (children's maternal grandmother) contacted clinician to give an update on pt's behaviors. Per Benjamin, pt's was despondent, it started Sunday after Thanksgiving she called and left a message. Per Benjamin, she could tell the pt was anxious, when picking up the children for school because pt had to go to work. Benjamin reported, the pt wouldn't speak face to face he would just look at you. Cancace reported she sent the pt a text message checking in and telling him no one is upset with him. Benjamin reported, she never heard back from the pt. Benjamin reported, this Monday and Tuesday her grandchildren did not go to school/daycare and she received a call from the Director of her grandsons daycare. Benjamin reported, she tried calling her granddaughters school to see if she went but was not given any information. Benjamin reported, there is an ongoing CPS case against her daughter (childrens mother), it was noted her daughter has a 50B. Benjamin reported, daycare called CPS to do a welfare check however the pt did not answer the door. Benjamin reported, the police were called and the pt still did not come to the door. Benjamin reported, the super at their apartment complex opened the door the pt was reading a book. Benjamin reported, the pt would not respond to anyone and it was recommended she IVC the pt. Benjamin reported, her grandchildren were eating peanut better and jelly sandwiches. Benjamin reported, this occurred in 2014 when the pt were catatonic and unresponsive.   Benjamin Passereylese D Bennett, MS, [facility], [facility] Triage Specialist [phone]

## 2016-07-09 NOTE — ED Notes (Signed)
This RN spoke with Bunn[facility] Pionori[facility]. It is unclear if patient is still appropriate to [facility] now that patient is no longer in a catatonic state. [facility]HH is holding a bed for patient pending another psychiatric evaluation in the morning.

## 2016-07-09 NOTE — Consult Note (Signed)
  ECT: Spoke by telephone with psychiatric nurse practitioner. Chart reviewed. 35 year old man with a past diagnosis of schizophrenia and a history of recurrent episodes of catatonia who is presenting with what appears to be other acute catatonic episode. Nothing in the chart suggests any contraindication to ECT. The request was made to [facility] with a plan to proceed with ECT treatment. I am happy to be of assistance. The earliest we would be able to begin ECT is Monday, December 11. If patient were transferred to our hospital currently he would need to be on the medical service because of his need for intravenous hydration and nursing care. He should continue on appropriate medication in the meanwhile. If he is still catatonic on Monday we could certainly get started with treatment then. He would be best if we could speak to some relative who is capable of giving consent. If the transfer proceeds we will try to pursue this with social work. Thank you for the consult.

## 2016-07-09 NOTE — ED Notes (Signed)
Introduced self to patient.  Pt staring straight ahead with no response to verbal stimulus.  No response to pain.  Pt eyes are equal and reactive and corneal reflex intact.  Flaccid arm and leg.  No resistance to falling.  Does not follow commands.  Pt not voiding, eating, drinking or taking meds.  Notified Dr. Rhunette CroftNanavati.  No change in condition with collaboration with previous RN assessment and care. Continue to monitor.

## 2016-07-09 NOTE — ED Provider Notes (Signed)
Notified by nursing that patient is now eating and moving after Ativan. His catatonia seems to have markedly improved. I have re-consult TTS as patient likely no longer needs [facility].   Shaune Pollackameron Sheera Illingworth, MD 07/09/16 2329

## 2016-07-09 NOTE — BHH Counselor (Addendum)
At 0350, clinician contacted the petitioner [name](Benjamin [name]) of the IVC paperwork to gather more information. Clinician left a HIPPA compliant message.  Benjamin Passereylese D Bennett, MS, [facility], [facility] Triage Specialist [phone]

## 2016-07-09 NOTE — BH Assessment (Addendum)
Tele Assessment Note   Benjamin [name] is an 35 y.o. male, who presents involuntarily and unaccompanied to [facility]. Pt did not speak to clinician during assessment, information obtained through IVC paperwork and chart notes.   Pts father of grandchilden took out IVC paperwork. Per IVC paperwork: "Respondent has been in the bed for at least two days. He will not tend to his children who are 4 & 8. The children are taking care of themselves. He is not talking, eating, drinking or bathing. He speaks of death in the afterlife. He will not put the bible and continue to read from it for the last 2 days."   Per Dr. Sharen Hones note: "This is a chronic problem. The current episode started more than 1 week ago. The problem occurs constantly. The problem has been rapidly worsening. Pertinent negatives include no abdominal pain. Nothing aggravates the symptoms. Nothing relieves the symptoms. He has tried nothing for the symptoms. The treatment provided no relief. Benjamin [name] is a 35 y.o. male who presents to the Emergency Department, here due to SI and unresponsiveness. Per EMS, [employer] and medical records, pt is mentally ill, under IVC taken out by family member, and has been in bed for 2 days not tending to children, not talking, eating, drinking or bathing, and has spoken about death and the after life. Per [employer], pt had to be dragged from his bed and did not speak."  Clinician was unable to assess the pt's abuse history. Pt's UDS is positive for marijuana. Clinician was unable to assess if pt has previous inpatient admissions.   Pt was lying in the bed looking at the ceiling in scrubs with elective mutism. Pt's eye contact was poor. Clinician was unable to assess pt's judgement, thought process, orientation, concentration, memory, insight and impulse control.   Diagnosis: Selective mutism  Past Medical History:  Past Medical History:  Diagnosis Date  . Depression   . Herniated disc    x3  . Hypertension   .  Mental disorder     Past Surgical History:  Procedure Laterality Date  . SHOULDER SURGERY      Family History: No family history on file.  Social History:  reports that he quit smoking about 4 years ago. His smoking use included Cigarettes. He smoked 1.00 pack per day. He has never used smokeless tobacco. He reports that he does not drink alcohol or use drugs.  Additional Social History:  Alcohol / Drug Use Pain Medications: See MAR Prescriptions: See MAR Over the Counter: See MAR History of alcohol / drug use?: No history of alcohol / drug abuse  CIWA: CIWA-Ar BP: 132/80 Pulse Rate: 67 COWS:    PATIENT STRENGTHS: (choose at least two) Average or above average intelligence Supportive family/friends  Allergies: No Known Allergies  Home Medications:  (Not in a hospital admission)  OB/GYN Status:  No LMP for male patient.  General Assessment Data Location of Assessment: [facility] TTS Assessment: In system Is this a Tele or Face-to-Face Assessment?: Face-to-Face Is this an Initial Assessment or a Re-assessment for this encounter?: Initial Assessment Marital status:  Rich Reining) Maiden name: NA Is patient pregnant?: No Pregnancy Status: No Living Arrangements:  (UTA) Can pt return to current living arrangement?: Yes Admission Status: Involuntary Referral Source:  ([employer]) Insurance type: Self-pay     Crisis Care Plan Living Arrangements:  (UTA) Legal Guardian: Other: (Self) Name of Psychiatrist: UTA Name of Therapist: UTA  Education Status Is patient currently in school?:  (UTA) Current Grade:  NA Highest grade of school patient has completed: NA Name of school: NA Contact person: NA  Risk to self with the past 6 months Suicidal Ideation: Yes-Currently Present (Per pt's chart.) Has patient been a risk to self within the past 6 months prior to admission? :  (UTA) Suicidal Intent:  (UTA) Has patient had any suicidal intent within the past 6 months prior to admission? :  Other (comment) (UTA) Is patient at risk for suicide?: Yes Suicidal Plan?: No Has patient had any suicidal plan within the past 6 months prior to admission? : Other (comment) (UTA) Access to Means:  (UTA) What has been your use of drugs/alcohol within the last 12 months?: Pt's UDS is positive for THC. Previous Attempts/Gestures:  (UTA) How many times?:  (UTA) Other Self Harm Risks: UTA Triggers for Past Attempts: Unknown Intentional Self Injurious Behavior:  (UTA) Family Suicide History: Unable to assess Recent stressful life event(s): Other (Comment) (UTA) Persecutory voices/beliefs?:  (UTA) Depression: Yes (Per chart) Substance abuse history and/or treatment for substance abuse?:  (UTA) Suicide prevention information given to non-admitted patients: Not applicable  Risk to Others within the past 6 months Homicidal Ideation:  (UTA) Does patient have any lifetime risk of violence toward others beyond the six months prior to admission? : Unknown Thoughts of Harm to Others:  (UTA) Current Homicidal Intent:  (UTA) Current Homicidal Plan:  (UTA) Access to Homicidal Means:  (UTA) Identified Victim: NA History of harm to others?:  (UTA) Assessment of Violence:  (UTA) Violent Behavior Description: NA Does patient have access to weapons?:  (UTA) Criminal Charges Pending?:  (UTA) Does patient have a court date:  (UTA) Is patient on probation?: Unknown  Psychosis Hallucinations:  (UTA) Delusions:  (UTA)  Mental Status Report Appearance/Hygiene: In scrubs Eye Contact: Poor Motor Activity: Unable to assess Speech: Elective mutism Level of Consciousness: Unresponsive (To pain or command) Mood:  (UTA) Affect: Flat Anxiety Level:  (UTA) Thought Processes: Unable to Assess Judgement: Unable to Assess Orientation: Unable to assess Obsessive Compulsive Thoughts/Behaviors: Unable to Assess  Cognitive Functioning Concentration: Unable to Assess Memory: Unable to Assess IQ:  Average Insight: Unable to Assess Impulse Control: Unable to Assess Appetite:  (UTA) Weight Loss:  (UTA) Weight Gain:  (UTA) Sleep: Unable to Assess Total Hours of Sleep:  (UTA) Vegetative Symptoms: Unable to Assess  ADLScreening [facility] Assessment Services) Patient's cognitive ability adequate to safely complete daily activities?: Yes Patient able to express need for assistance with ADLs?: Yes Independently performs ADLs?: Yes (appropriate for developmental age)  Prior Inpatient Therapy Prior Inpatient Therapy:  (UTA) Prior Therapy Dates: UTA Prior Therapy Facilty/Provider(s): UTA Reason for Treatment: UTA  Prior Outpatient Therapy Prior Outpatient Therapy:  (UTA) Prior Therapy Dates: UTA Prior Therapy Facilty/Provider(s): UTA Reason for Treatment: UTA Does patient have an ACCT team?: Unknown Does patient have Intensive In-House Services?  : Unknown Does patient have [facility] services? : Unknown Does patient have P4CC services?: Unknown  ADL Screening (condition at time of admission) Patient's cognitive ability adequate to safely complete daily activities?: Yes Is the patient deaf or have difficulty hearing?:  (UTA) Does the patient have difficulty seeing, even when wearing glasses/contacts?:  (UTA) Does the patient have difficulty concentrating, remembering, or making decisions?:  (UTA) Patient able to express need for assistance with ADLs?: Yes Does the patient have difficulty dressing or bathing?:  (UTA) Independently performs ADLs?: Yes (appropriate for developmental age) Does the patient have difficulty walking or climbing stairs?:  (UTA) Weakness of Legs:  (UTA)  Weakness of Arms/Hands:  (UTA)       Abuse/Neglect Assessment (Assessment to be complete while patient is alone) Physical Abuse:  (UTA) Verbal Abuse:  (UTA) Sexual Abuse:  (UTA) Exploitation of patient/patient's resources:  Industrial/product designer(UTA)     Merchant navy officerAdvance Directives (For Healthcare) Does Patient Have a Medical  Advance Directive?: No    Additional Information 1:1 In Past 12 Months?: No CIRT Risk: No Elopement Risk: No Does patient have medical clearance?: Yes     Disposition: Donell SievertSpencer Simon, PA recommends inpatient treatment. Disposition discussed with Dr. Nicanor AlconPalumbo and Mardella LaymanLindsey, RN. [facility] T[facility] to seek placement.  Disposition Initial Assessment Completed for this Encounter: Yes Disposition of Patient: Inpatient treatment program Type of inpatient treatment program: Adult  Gwinda Passereylese D Bennett 07/09/2016 4:06 AM   Gwinda Passereylese D Bennett, MS, [facility], [facility] Triage Specialist [phone]

## 2016-07-09 NOTE — ED Notes (Signed)
Pt finished meal tray and is now verbal and able to move extremities x4. Pt calm, and cooperative. NAD noted.

## 2016-07-09 NOTE — ED Triage Notes (Signed)
Pt is IVC for med clearance and psych eval.

## 2016-07-09 NOTE — ED Provider Notes (Signed)
[facility]-EMERGENCY DEPT Provider Note   CSN: [id number] Arrival date & time: 07/08/16  2353   By signing my name below, I, Teofilo PodMatthew P. Jamison, attest that this documentation has been prepared under the direction and in the presence of Bryor Rami, MD . Electronically Signed: Teofilo PodMatthew P. Jamison, ED Scribe. 07/09/2016. 12:27 AM.   History   Chief Complaint Chief Complaint  Patient presents with  . Suicidal   LEVEL 5 CAVEAT DUE TO mutism The history is provided by the EMS personnel, the police and medical records. No language interpreter was used.  Depression  This is a chronic problem. The current episode started more than 1 week ago. The problem occurs constantly. The problem has been rapidly worsening. Pertinent negatives include no abdominal pain. Nothing aggravates the symptoms. Nothing relieves the symptoms. He has tried nothing for the symptoms. The treatment provided no relief.   HPI Comments:  Benjamin [name] is a 35 y.o. male who presents to the Emergency Department, here due to SI and unresponsiveness. Per EMS, [employer] and medical records, pt is mentally ill, under IVC taken out by family member, and has been in bed for 2 days not tending to children, not talking, eating, drinking or bathing, and has spoken about death and the after life. Per [employer], pt had to be dragged from his bed and did not speak.    Past Medical History:  Diagnosis Date  . Depression   . Herniated disc    x3  . Hypertension   . Mental disorder     Patient Active Problem List   Diagnosis Date Noted  . Malingering 09/29/2012  . 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 Bradycardia 09/18/2012  . Decreased oral intake 09/17/2012  . Psychosis 09/09/2012  . Major depressive disorder, recurrent episode, severe, specified as with psychotic behavior 09/08/2012  . History of substance abuse 09/08/2012    Past Surgical History:  Procedure Laterality Date  . SHOULDER SURGERY         Home Medications    Prior to  Admission medications   Medication Sig Start Date End Date Taking? Authorizing Provider  benztropine (COGENTIN) 1 MG tablet Take 1 tablet (1 mg total) by mouth at bedtime. For possible side effects. 09/29/12   Tamala JulianNeil T Mashburn, PA-C  OLANZapine (ZYPREXA) 10 MG tablet Take 1 tablet (10 mg total) by mouth at bedtime. For psychosis and depression. 09/29/12   Tamala JulianNeil T Mashburn, PA-C    Family History No family history on file.  Social History Social History  Substance Use Topics  . Smoking status: Former Smoker    Packs/day: 1.00    Types: Cigarettes    Quit date: 04/04/2012  . Smokeless tobacco: Not on file  . Alcohol use No     Allergies   Patient has no known allergies.   Review of Systems Review of Systems  Unable to perform ROS: Psychiatric disorder  Gastrointestinal: Negative for abdominal pain.  Psychiatric/Behavioral: Positive for depression.   LEVEL 5 CAVEAT DUE TO UNRESPONSIVENESS   Physical Exam Updated Vital Signs BP 144/87 (BP Location: Right Arm)   Pulse 62   Temp 98.3 F (36.8 C) (Oral)   Resp 14   SpO2 98%   Physical Exam  Constitutional: He appears well-developed and well-nourished. No distress.  HENT:  Head: Normocephalic and atraumatic.  Nose: Nose normal.  Mouth/Throat: Oropharynx is clear and moist.  Trachea midline  Eyes: Conjunctivae and EOM are normal. Pupils are equal, round, and reactive to light.  Pupils borderline pinpoint  Neck: Trachea normal and normal range of motion. Neck supple. No JVD present. Carotid bruit is not present.  Cardiovascular: Normal rate, regular rhythm, normal heart sounds and intact distal pulses.  Exam reveals no gallop and no friction rub.   No murmur heard. Pulmonary/Chest: Effort normal and breath sounds normal. No stridor. He has no wheezes. He has no rales.  Lungs clear.  Abdominal: Soft. Bowel sounds are normal. He exhibits no mass. There is no tenderness. There is no rebound and no guarding.  No hepatomegaly or  splenomegaly.   Musculoskeletal: Normal range of motion.  Lymphadenopathy:    He has no cervical adenopathy.  Neurological: He is alert. He has normal reflexes. He displays normal reflexes. No cranial nerve deficit. He exhibits normal muscle tone. Coordination normal. GCS eye subscore is 4. GCS verbal subscore is 1. GCS motor subscore is 6.  Cranial nerves 2-12 intact  Skin: Skin is warm and dry. Capillary refill takes less than 2 seconds. He is not diaphoretic.  Psychiatric: He has a normal mood and affect. His behavior is normal.     ED Treatments / Results   Vitals:   07/09/16 0100 07/09/16 0200  BP: 143/84 131/78  Pulse: 60   Resp:  15  Temp:      DIAGNOSTIC STUDIES:  Oxygen Saturation is 100% on RA, normal by my interpretation.   Results for orders placed or performed during the hospital encounter of 07/08/16  CBC with Differential/Platelet  Result Value Ref Range   WBC 10.3 4.0 - 10.5 K/uL   RBC 5.63 4.22 - 5.81 MIL/uL   Hemoglobin 17.4 (H) 13.0 - 17.0 g/dL   HCT 16.150.4 09.639.0 - 04.552.0 %   MCV 89.5 78.0 - 100.0 fL   MCH 30.9 26.0 - 34.0 pg   MCHC 34.5 30.0 - 36.0 g/dL   RDW 40.912.8 81.111.5 - 91.415.5 %   Platelets 244 150 - 400 K/uL   Neutrophils Relative % 76 %   Neutro Abs 7.9 (H) 1.7 - 7.7 K/uL   Lymphocytes Relative 17 %   Lymphs Abs 1.7 0.7 - 4.0 K/uL   Monocytes Relative 6 %   Monocytes Absolute 0.6 0.1 - 1.0 K/uL   Eosinophils Relative 1 %   Eosinophils Absolute 0.1 0.0 - 0.7 K/uL   Basophils Relative 0 %   Basophils Absolute 0.0 0.0 - 0.1 K/uL  Comprehensive metabolic panel  Result Value Ref Range   Sodium 141 135 - 145 mmol/L   Potassium 3.7 3.5 - 5.1 mmol/L   Chloride 106 101 - 111 mmol/L   CO2 22 22 - 32 mmol/L   Glucose, Bld 82 65 - 99 mg/dL   BUN 25 (H) 6 - 20 mg/dL   Creatinine, Ser 7.820.94 0.61 - 1.24 mg/dL   Calcium 9.9 8.9 - 95.610.3 mg/dL   Total Protein 8.3 (H) 6.5 - 8.1 g/dL   Albumin 5.3 (H) 3.5 - 5.0 g/dL   AST 22 15 - 41 U/L   ALT 26 17 - 63 U/L    Alkaline Phosphatase 41 38 - 126 U/L   Total Bilirubin 1.3 (H) 0.3 - 1.2 mg/dL   GFR calc non Af Amer >60 >60 mL/min   GFR calc Af Amer >60 >60 mL/min   Anion gap 13 5 - 15  Rapid urine drug screen (hospital performed)  Result Value Ref Range   Opiates NONE DETECTED NONE DETECTED   Cocaine NONE DETECTED NONE DETECTED   Benzodiazepines NONE DETECTED NONE DETECTED  Amphetamines NONE DETECTED NONE DETECTED   Tetrahydrocannabinol POSITIVE (A) NONE DETECTED   Barbiturates NONE DETECTED NONE DETECTED  Urinalysis, Routine w reflex microscopic  Result Value Ref Range   Color, Urine YELLOW YELLOW   APPearance HAZY (A) CLEAR   Specific Gravity, Urine 1.030 1.005 - 1.030   pH 5.0 5.0 - 8.0   Glucose, UA NEGATIVE NEGATIVE mg/dL   Hgb urine dipstick NEGATIVE NEGATIVE   Bilirubin Urine NEGATIVE NEGATIVE   Ketones, ur 80 (A) NEGATIVE mg/dL   Protein, ur 30 (A) NEGATIVE mg/dL   Nitrite NEGATIVE NEGATIVE   Leukocytes, UA NEGATIVE NEGATIVE   RBC / HPF 0-5 0 - 5 RBC/hpf   WBC, UA 0-5 0 - 5 WBC/hpf   Bacteria, UA NONE SEEN NONE SEEN   Squamous Epithelial / LPF NONE SEEN NONE SEEN   Mucous PRESENT   Ethanol  Result Value Ref Range   Alcohol, Ethyl (B) <5 <5 mg/dL  Acetaminophen level  Result Value Ref Range   Acetaminophen (Tylenol), Serum <10 (L) 10 - 30 ug/mL  Salicylate level  Result Value Ref Range   Salicylate Lvl <7.0 2.8 - 30.0 mg/dL  I-stat chem 8, ed  Result Value Ref Range   Sodium 145 135 - 145 mmol/L   Potassium 3.7 3.5 - 5.1 mmol/L   Chloride 105 101 - 111 mmol/L   BUN 24 (H) 6 - 20 mg/dL   Creatinine, Ser 1.61 0.61 - 1.24 mg/dL   Glucose, Bld 78 65 - 99 mg/dL   Calcium, Ion 0.96 0.45 - 1.40 mmol/L   TCO2 22 0 - 100 mmol/L   Hemoglobin 17.0 13.0 - 17.0 g/dL   HCT 40.9 81.1 - 91.4 %   No results found.  COORDINATION OF CARE:  12:26 AM Pt was unresponsive and was unable to agree to plan.     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  Patient is under IVC by family and will need inpatient stabilization.  He is medically clear    I personally performed the services described in this documentation, which was scribed in my presence. The recorded information has been reviewed and is accurate.       Cy Blamer, MD 07/09/16 [phone]

## 2016-07-09 NOTE — [facility] Counselor (Addendum)
Clinician noted from [place], [facility] per Donell SievertSpencer Simon, PA recommends pt is observed overnight and seen in the morning by psychiatry, if inpatient treatment is recommended [facility]. Clinician provided an updated disposition to Irving BurtonEmily, Charity fundraiserN. Clinician expressed to Irving BurtonEmily, RN pt was seen by psychiatry this morning as well. Bunnie Pionori, AC has discussed pt's disposition with Irving BurtonEmily, RN as well. Clinician asked Irving Burtonmily, RN if the TTS consult can be removed as pt's case was reviewed by extender and AC.   Gwinda Passereylese D Bennett, MS, [facility], [facility] Triage Specialist [phone]

## 2016-07-09 NOTE — Consult Note (Signed)
[facility] Consult   Reason for Consult:  Depression with catatonia  Referring Physician:  EDP Patient Identification: Benjamin [name] MRN:  [id number] Principal Diagnosis: Major depressive disorder, recurrent episode, severe with catatonia ([place]) Diagnosis:   Patient Active Problem List   Diagnosis Date Noted  . Major depressive disorder, recurrent episode, severe with catatonia ([place]) [F33.2, F06.1] 07/09/2016    Priority: High  . 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 Malingering [Z76.5] 09/29/2012  . 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 Bradycardia [R00.1] 09/18/2012  . Decreased oral intake [R63.8] 09/17/2012  . Psychosis [F29] 09/09/2012  . Severe episode of recurrent major depressive disorder, with psychotic features ([place]) [F33.3] 09/08/2012  . History of substance abuse [Z87.898] 09/08/2012    Total Time spent with patient: 45 minutes  Subjective:   Benjamin [name] is a 35 y.o. male patient admitted with catatonia.  HPI:  35 yo male who was sent to the ED with catatonia.  He is not eating, drinking, moving, etc.  Benjamin [name] has not urinated since last night when he was catheterized.  Ativan IM given with little response.  Medial needs are more pertinent at this time.  Past Psychiatric History: depression, catatonia   Risk to Self: Suicidal Ideation: Yes-Currently Present (Per pt's chart.) Suicidal Intent:  (UTA) Is patient at risk for suicide?: Yes Suicidal Plan?: No Access to Means:  (UTA) What has been your use of drugs/alcohol within the last 12 months?: Pt's UDS is positive for THC. How many times?:  (UTA) Other Self Harm Risks: UTA Triggers for Past Attempts: Unknown Intentional Self Injurious Behavior:  (UTA) Risk to Others: Homicidal Ideation:  (UTA) Thoughts of Harm to Others:  (UTA) Current Homicidal Intent:  (UTA) Current Homicidal Plan:  (UTA) Access to Homicidal Means:  (UTA) Identified Victim: NA History of harm to others?:   (UTA) Assessment of Violence:  (UTA) Violent Behavior Description: NA Does patient have access to weapons?:  (UTA) Criminal Charges Pending?:  (UTA) Does patient have a court date:  (UTA) Prior Inpatient Therapy: Prior Inpatient Therapy:  (UTA) Prior Therapy Dates: UTA Prior Therapy Facilty/Provider(s): UTA Reason for Treatment: UTA Prior Outpatient Therapy: Prior Outpatient Therapy:  (UTA) Prior Therapy Dates: UTA Prior Therapy Facilty/Provider(s): UTA Reason for Treatment: UTA Does patient have an ACCT team?: Unknown Does patient have Intensive In-House Services?  : Unknown Does patient have [facility] services? : Unknown Does patient have P4CC services?: Unknown  Past Medical History:  Past Medical History:  Diagnosis Date  . Depression   . Herniated disc    x3  . Hypertension   . Mental disorder     Past Surgical History:  Procedure Laterality Date  . SHOULDER SURGERY     Family History: No family history on file. Family Psychiatric  History: unknown Social History:  History  Alcohol Use No     History  Drug Use No    Social History   Social History  . Marital status: Single    Spouse name: N/A  . Number of children: N/A  . Years of education: N/A   Social History Main Topics  . Smoking status: Former Smoker    Packs/day: 1.00    Types: Cigarettes    Quit date: 04/04/2012  . Smokeless tobacco: Never Used  . Alcohol use No  . Drug use: No  . Sexual activity: No   Other Topics Concern  . None   Social History Narrative  . None   Additional Social History:  Allergies:  No Known Allergies  Labs:  Results for orders placed or performed during the hospital encounter of 07/08/16 (from the past 48 hour(s))  CBC with Differential/Platelet     Status: Abnormal   Collection Time: 07/09/16 12:29 AM  Result Value Ref Range   WBC 10.3 4.0 - 10.5 K/uL   RBC 5.63 4.22 - 5.81 MIL/uL   Hemoglobin 17.4 (H) 13.0 - 17.0 g/dL   HCT 50.4 39.0 - 52.0 %   MCV  89.5 78.0 - 100.0 fL   MCH 30.9 26.0 - 34.0 pg   MCHC 34.5 30.0 - 36.0 g/dL   RDW 12.8 11.5 - 15.5 %   Platelets 244 150 - 400 K/uL   Neutrophils Relative % 76 %   Neutro Abs 7.9 (H) 1.7 - 7.7 K/uL   Lymphocytes Relative 17 %   Lymphs Abs 1.7 0.7 - 4.0 K/uL   Monocytes Relative 6 %   Monocytes Absolute 0.6 0.1 - 1.0 K/uL   Eosinophils Relative 1 %   Eosinophils Absolute 0.1 0.0 - 0.7 K/uL   Basophils Relative 0 %   Basophils Absolute 0.0 0.0 - 0.1 K/uL  Comprehensive metabolic panel     Status: Abnormal   Collection Time: 07/09/16 12:29 AM  Result Value Ref Range   Sodium 141 135 - 145 mmol/L   Potassium 3.7 3.5 - 5.1 mmol/L   Chloride 106 101 - 111 mmol/L   CO2 22 22 - 32 mmol/L   Glucose, Bld 82 65 - 99 mg/dL   BUN 25 (H) 6 - 20 mg/dL   Creatinine, Ser 0.94 0.61 - 1.24 mg/dL   Calcium 9.9 8.9 - 10.3 mg/dL   Total Protein 8.3 (H) 6.5 - 8.1 g/dL   Albumin 5.3 (H) 3.5 - 5.0 g/dL   AST 22 15 - 41 U/L   ALT 26 17 - 63 U/L   Alkaline Phosphatase 41 38 - 126 U/L   Total Bilirubin 1.3 (H) 0.3 - 1.2 mg/dL   GFR calc non Af Amer >60 >60 mL/min   GFR calc Af Amer >60 >60 mL/min    Comment: (NOTE) The eGFR has been calculated using the CKD EPI equation. This calculation has not been validated in all clinical situations. eGFR's persistently <60 mL/min signify possible Chronic Kidney Disease.    Anion gap 13 5 - 15  Rapid urine drug screen (hospital performed)     Status: Abnormal   Collection Time: 07/09/16 12:29 AM  Result Value Ref Range   Opiates NONE DETECTED NONE DETECTED   Cocaine NONE DETECTED NONE DETECTED   Benzodiazepines NONE DETECTED NONE DETECTED   Amphetamines NONE DETECTED NONE DETECTED   Tetrahydrocannabinol POSITIVE (A) NONE DETECTED   Barbiturates NONE DETECTED NONE DETECTED    Comment:        DRUG SCREEN FOR MEDICAL PURPOSES ONLY.  IF CONFIRMATION IS NEEDED FOR ANY PURPOSE, NOTIFY LAB WITHIN 5 DAYS.        LOWEST DETECTABLE LIMITS FOR URINE DRUG  SCREEN Drug Class       Cutoff (ng/mL) Amphetamine      1000 Barbiturate      200 Benzodiazepine   259 Tricyclics       563 Opiates          300 Cocaine          300 THC              50   Urinalysis, Routine w reflex microscopic     Status:  Abnormal   Collection Time: 07/09/16 12:29 AM  Result Value Ref Range   Color, Urine YELLOW YELLOW   APPearance HAZY (A) CLEAR   Specific Gravity, Urine 1.030 1.005 - 1.030   pH 5.0 5.0 - 8.0   Glucose, UA NEGATIVE NEGATIVE mg/dL   Hgb urine dipstick NEGATIVE NEGATIVE   Bilirubin Urine NEGATIVE NEGATIVE   Ketones, ur 80 (A) NEGATIVE mg/dL   Protein, ur 30 (A) NEGATIVE mg/dL   Nitrite NEGATIVE NEGATIVE   Leukocytes, UA NEGATIVE NEGATIVE   RBC / HPF 0-5 0 - 5 RBC/hpf   WBC, UA 0-5 0 - 5 WBC/hpf   Bacteria, UA NONE SEEN NONE SEEN   Squamous Epithelial / LPF NONE SEEN NONE SEEN   Mucous PRESENT   Ethanol     Status: None   Collection Time: 07/09/16 12:29 AM  Result Value Ref Range   Alcohol, Ethyl (B) <5 <5 mg/dL    Comment:        LOWEST DETECTABLE LIMIT FOR SERUM ALCOHOL IS 5 mg/dL FOR MEDICAL PURPOSES ONLY   Acetaminophen level     Status: Abnormal   Collection Time: 07/09/16 12:29 AM  Result Value Ref Range   Acetaminophen (Tylenol), Serum <10 (L) 10 - 30 ug/mL    Comment:        THERAPEUTIC CONCENTRATIONS VARY SIGNIFICANTLY. A RANGE OF 10-30 ug/mL MAY BE AN EFFECTIVE CONCENTRATION FOR MANY PATIENTS. HOWEVER, SOME ARE BEST TREATED AT CONCENTRATIONS OUTSIDE THIS RANGE. ACETAMINOPHEN CONCENTRATIONS >150 ug/mL AT 4 HOURS AFTER INGESTION AND >50 ug/mL AT 12 HOURS AFTER INGESTION ARE OFTEN ASSOCIATED WITH TOXIC REACTIONS.   Salicylate level     Status: None   Collection Time: 07/09/16 12:29 AM  Result Value Ref Range   Salicylate Lvl <3.0 2.8 - 30.0 mg/dL  I-stat chem 8, ed     Status: Abnormal   Collection Time: 07/09/16  1:02 AM  Result Value Ref Range   Sodium 145 135 - 145 mmol/L   Potassium 3.7 3.5 - 5.1 mmol/L    Chloride 105 101 - 111 mmol/L   BUN 24 (H) 6 - 20 mg/dL   Creatinine, Ser 0.90 0.61 - 1.24 mg/dL   Glucose, Bld 78 65 - 99 mg/dL   Calcium, Ion 1.24 1.15 - 1.40 mmol/L   TCO2 22 0 - 100 mmol/L   Hemoglobin 17.0 13.0 - 17.0 g/dL   HCT 50.0 39.0 - 52.0 %    Current Facility-Administered Medications  Medication Dose Route Frequency Provider Last Rate Last Dose  . acetaminophen (TYLENOL) tablet 650 mg  650 mg Oral Q4H PRN April Palumbo, MD      . alum & mag hydroxide-simeth (MAALOX/MYLANTA) 200-200-20 MG/5ML suspension 30 mL  30 mL Oral PRN April Palumbo, MD      . ibuprofen (ADVIL,MOTRIN) tablet 600 mg  600 mg Oral Q8H PRN April Palumbo, MD      . ondansetron [facility]) tablet 4 mg  4 mg Oral Q8H PRN April Palumbo, MD       Current Outpatient Prescriptions  Medication Sig Dispense Refill  . benztropine (COGENTIN) 1 MG tablet Take 1 tablet (1 mg total) by mouth at bedtime. For possible side effects. 30 tablet 0  . OLANZapine (ZYPREXA) 10 MG tablet Take 1 tablet (10 mg total) by mouth at bedtime. For psychosis and depression. 30 tablet 0    Musculoskeletal: Strength & Muscle Tone: flaccid Gait & Station: unable to stand Patient leans: N/A  Psychiatric Specialty Exam: Physical Exam  Constitutional:  He appears well-developed and well-nourished.  Psychiatric: Cognition and memory are impaired. He exhibits a depressed mood. He is noncommunicative.  Assessment based on his history    Review of Systems  Psychiatric/Behavioral: Positive for depression.    Blood pressure 129/80, pulse 70, temperature 98.9 F (37.2 C), temperature source Oral, resp. rate 16, SpO2 94 %.There is no height or weight on file to calculate BMI.  General Appearance: Casual  Eye Contact:  None  Speech:  Negative  Volume:  no speech  Mood:  Depressed based on history  Affect:  Flat  Thought Process:  Unable to assess  Orientation:  Unable to assess  Thought Content:  Unable to assess  Suicidal Thoughts:   Unable to assess  Homicidal Thoughts:  Unable to assess  Memory:  Unable to assess  Judgement:  Unable to assess  Insight:  Unable to assess  Psychomotor Activity:  Unable to assess  Concentration:  Unable to assess  Recall:  Unable to assess  Fund of Knowledge:  Unable to assess  Language:  Negative  Akathisia:  Unable to assess  Handed:  Unable to assess  AIMS (if indicated):     Assets:  Housing Leisure Time Physical Health Resilience Social Support  ADL's:  Impaired  Cognition:  Impaired,  Severe  Sleep:        Treatment Plan Summary: Daily contact with patient to assess and evaluate symptoms and progress in treatment, Medication management and Plan major depressive disorder, severe, with catatonia:  -Crisis stabilization -Medication management:  Ativan 2 mg IM for catatonia, not drinking or eating -Individual counseling  Disposition: Medical admission with psychiatric consult  Waylan Boga, NP 07/09/2016 1:39 PM  Patient seen face-to-face for psychiatric evaluation, chart reviewed and case discussed with the physician extender and developed treatment plan. Reviewed the information documented and agree with the treatment plan. Corena Pilgrim, MD

## 2016-07-09 NOTE — ED Notes (Signed)
We offer him breakfast; from which we obtain no response. I continually offer him kind, quiet reassurance and remind him at any time to let us know if he needs anything.

## 2016-07-09 NOTE — ED Provider Notes (Signed)
I was asked to sign the paper recommending oral psych meds for the patient. Pt is catatonic. Vitals:   07/09/16 0404 07/09/16 0539  BP: 132/79 147/82  Pulse: 63 70  Resp: 15 16  Temp:  98.2 F (36.8 C)   Psych team wants pt to get zyprexa and ativan. I agree that the risk outweigh the benefit on side effects and that the medicine will potentially have significant clinical benefits.   Derwood KaplanAnkit Saren Corkern, MD 07/09/16 1126

## 2016-07-10 ENCOUNTER — Inpatient Hospital Stay
Admission: EM | Admit: 2016-07-10 | Payer: No Typology Code available for payment source | Source: Intra-hospital | Admitting: Psychiatry

## 2016-07-10 MED ORDER — LORAZEPAM 1 MG PO TABS: 2.0000 mg | ORAL_TABLET | ORAL | Status: DC

## 2016-07-10 MED ORDER — LORAZEPAM 2 MG/ML IJ SOLN: 2.0000 mg | INTRAMUSCULAR | Status: DC | PRN

## 2016-07-10 MED ORDER — LORAZEPAM 1 MG PO TABS
2.0000 mg | ORAL_TABLET | ORAL | Status: DC | PRN
  Administered 2016-07-10 (×4): 2 mg via ORAL
  Filled 2016-07-10 (×5): qty 2

## 2016-07-10 MED ORDER — OLANZAPINE 10 MG PO TBDP: 10.0000 mg | ORAL_TABLET | Freq: Two times a day (BID) | ORAL | Status: DC

## 2016-07-10 NOTE — BH Assessment (Signed)
[facility] been [facility].  Accepting physician is Dr. Toni Amendlapacs.  Attending Physician will be Dr. Toni Amendlapacs.  Patient [facility] to room 310, [facility] RoselandPhyllis.  Call report to [phone]. Representative/Transfer Coordinator is Kallon Caylor. [facility] Access Byrd Hesselbach([place]).   [facility] ER Staff Maisie Fus(Thomas, H., TTS) made aware of acceptance. [facility] Staff Lillia Abed([place] made aware of acceptance.

## 2016-07-10 NOTE — Consult Note (Signed)
[facility] Consult   Reason for Consult:  Depression with catatonia  Referring Physician:  EDP Patient Identification: Benjamin [name] MRN:  [id number] Principal Diagnosis: Major depressive disorder, recurrent episode, severe with catatonia ([place]) Diagnosis:   Patient Active Problem List   Diagnosis Date Noted  . Major depressive disorder, recurrent episode, severe with catatonia ([place]) [F33.2, F06.1] 07/09/2016    Priority: High  . 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 Malingering [Z76.5] 09/29/2012  . 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 Bradycardia [R00.1] 09/18/2012  . Decreased oral intake [R63.8] 09/17/2012  . Psychosis [F29] 09/09/2012  . Severe episode of recurrent major depressive disorder, with psychotic features ([place]) [F33.3] 09/08/2012  . History of substance abuse [Z87.898] 09/08/2012    Total Time spent with patient: 45 minutes  Subjective:   Benjamin [name] is a 35 y.o. male patient admitted with catatonia.  HPI:  34 yo male who was sent to the ED with catatonia.  He was started on Ativan yesterday and started eating, drinking, taking medications, and urinating in his urinal.  Benjamin [name] is weak from not moving but is gradually improving.  One person assist for ambulation but is not attempting to initiate ambulation without assistance.  Still nonverbal.  Past Psychiatric History: depression, catatonia   Risk to Self: Suicidal Ideation: Yes-Currently Present (Per pt's chart.) Suicidal Intent:  (UTA) Is patient at risk for suicide?: Yes Suicidal Plan?: No Access to Means:  (UTA) What has been your use of drugs/alcohol within the last 12 months?: Pt's UDS is positive for THC. How many times?:  (UTA) Other Self Harm Risks: UTA Triggers for Past Attempts: Unknown Intentional Self Injurious Behavior:  (UTA) Risk to Others: Homicidal Ideation:  (UTA) Thoughts of Harm to Others:  (UTA) Current Homicidal Intent:  (UTA) Current Homicidal Plan:   (UTA) Access to Homicidal Means:  (UTA) Identified Victim: NA History of harm to others?:  (UTA) Assessment of Violence:  (UTA) Violent Behavior Description: NA Does patient have access to weapons?:  (UTA) Criminal Charges Pending?:  (UTA) Does patient have a court date:  (UTA) Prior Inpatient Therapy: Prior Inpatient Therapy:  (UTA) Prior Therapy Dates: UTA Prior Therapy Facilty/Provider(s): UTA Reason for Treatment: UTA Prior Outpatient Therapy: Prior Outpatient Therapy:  (UTA) Prior Therapy Dates: UTA Prior Therapy Facilty/Provider(s): UTA Reason for Treatment: UTA Does patient have an ACCT team?: Unknown Does patient have Intensive In-House Services?  : Unknown Does patient have [facility] services? : Unknown Does patient have P4CC services?: Unknown  Past Medical History:  Past Medical History:  Diagnosis Date  . Depression   . Herniated disc    x3  . Hypertension   . Mental disorder     Past Surgical History:  Procedure Laterality Date  . SHOULDER SURGERY     Family History: No family history on file. Family Psychiatric  History: unknown Social History:  History  Alcohol Use No     History  Drug Use No    Social History   Social History  . Marital status: Single    Spouse name: N/A  . Number of children: N/A  . Years of education: N/A   Social History Main Topics  . Smoking status: Former Smoker    Packs/day: 1.00    Types: Cigarettes    Quit date: 04/04/2012  . Smokeless tobacco: Never Used  . Alcohol use No  . Drug use: No  . Sexual activity: No   Other Topics Concern  . None   Social History Narrative  .  None   Additional Social History:    Allergies:  No Known Allergies  Labs:  Results for orders placed or performed during the hospital encounter of 07/08/16 (from the past 48 hour(s))  CBC with Differential/Platelet     Status: Abnormal   Collection Time: 07/09/16 12:29 AM  Result Value Ref Range   WBC 10.3 4.0 - 10.5 K/uL   RBC 5.63  4.22 - 5.81 MIL/uL   Hemoglobin 17.4 (H) 13.0 - 17.0 g/dL   HCT 50.4 39.0 - 52.0 %   MCV 89.5 78.0 - 100.0 fL   MCH 30.9 26.0 - 34.0 pg   MCHC 34.5 30.0 - 36.0 g/dL   RDW 12.8 11.5 - 15.5 %   Platelets 244 150 - 400 K/uL   Neutrophils Relative % 76 %   Neutro Abs 7.9 (H) 1.7 - 7.7 K/uL   Lymphocytes Relative 17 %   Lymphs Abs 1.7 0.7 - 4.0 K/uL   Monocytes Relative 6 %   Monocytes Absolute 0.6 0.1 - 1.0 K/uL   Eosinophils Relative 1 %   Eosinophils Absolute 0.1 0.0 - 0.7 K/uL   Basophils Relative 0 %   Basophils Absolute 0.0 0.0 - 0.1 K/uL  Comprehensive metabolic panel     Status: Abnormal   Collection Time: 07/09/16 12:29 AM  Result Value Ref Range   Sodium 141 135 - 145 mmol/L   Potassium 3.7 3.5 - 5.1 mmol/L   Chloride 106 101 - 111 mmol/L   CO2 22 22 - 32 mmol/L   Glucose, Bld 82 65 - 99 mg/dL   BUN 25 (H) 6 - 20 mg/dL   Creatinine, Ser 0.94 0.61 - 1.24 mg/dL   Calcium 9.9 8.9 - 10.3 mg/dL   Total Protein 8.3 (H) 6.5 - 8.1 g/dL   Albumin 5.3 (H) 3.5 - 5.0 g/dL   AST 22 15 - 41 U/L   ALT 26 17 - 63 U/L   Alkaline Phosphatase 41 38 - 126 U/L   Total Bilirubin 1.3 (H) 0.3 - 1.2 mg/dL   GFR calc non Af Amer >60 >60 mL/min   GFR calc Af Amer >60 >60 mL/min    Comment: (NOTE) The eGFR has been calculated using the CKD EPI equation. This calculation has not been validated in all clinical situations. eGFR's persistently <60 mL/min signify possible Chronic Kidney Disease.    Anion gap 13 5 - 15  Rapid urine drug screen (hospital performed)     Status: Abnormal   Collection Time: 07/09/16 12:29 AM  Result Value Ref Range   Opiates NONE DETECTED NONE DETECTED   Cocaine NONE DETECTED NONE DETECTED   Benzodiazepines NONE DETECTED NONE DETECTED   Amphetamines NONE DETECTED NONE DETECTED   Tetrahydrocannabinol POSITIVE (A) NONE DETECTED   Barbiturates NONE DETECTED NONE DETECTED    Comment:        DRUG SCREEN FOR MEDICAL PURPOSES ONLY.  IF CONFIRMATION IS NEEDED FOR ANY  PURPOSE, NOTIFY LAB WITHIN 5 DAYS.        LOWEST DETECTABLE LIMITS FOR URINE DRUG SCREEN Drug Class       Cutoff (ng/mL) Amphetamine      1000 Barbiturate      200 Benzodiazepine   034 Tricyclics       742 Opiates          300 Cocaine          300 THC              50   Urinalysis,  Routine w reflex microscopic     Status: Abnormal   Collection Time: 07/09/16 12:29 AM  Result Value Ref Range   Color, Urine YELLOW YELLOW   APPearance HAZY (A) CLEAR   Specific Gravity, Urine 1.030 1.005 - 1.030   pH 5.0 5.0 - 8.0   Glucose, UA NEGATIVE NEGATIVE mg/dL   Hgb urine dipstick NEGATIVE NEGATIVE   Bilirubin Urine NEGATIVE NEGATIVE   Ketones, ur 80 (A) NEGATIVE mg/dL   Protein, ur 30 (A) NEGATIVE mg/dL   Nitrite NEGATIVE NEGATIVE   Leukocytes, UA NEGATIVE NEGATIVE   RBC / HPF 0-5 0 - 5 RBC/hpf   WBC, UA 0-5 0 - 5 WBC/hpf   Bacteria, UA NONE SEEN NONE SEEN   Squamous Epithelial / LPF NONE SEEN NONE SEEN   Mucous PRESENT   Ethanol     Status: None   Collection Time: 07/09/16 12:29 AM  Result Value Ref Range   Alcohol, Ethyl (B) <5 <5 mg/dL    Comment:        LOWEST DETECTABLE LIMIT FOR SERUM ALCOHOL IS 5 mg/dL FOR MEDICAL PURPOSES ONLY   Acetaminophen level     Status: Abnormal   Collection Time: 07/09/16 12:29 AM  Result Value Ref Range   Acetaminophen (Tylenol), Serum <10 (L) 10 - 30 ug/mL    Comment:        THERAPEUTIC CONCENTRATIONS VARY SIGNIFICANTLY. A RANGE OF 10-30 ug/mL MAY BE AN EFFECTIVE CONCENTRATION FOR MANY PATIENTS. HOWEVER, SOME ARE BEST TREATED AT CONCENTRATIONS OUTSIDE THIS RANGE. ACETAMINOPHEN CONCENTRATIONS >150 ug/mL AT 4 HOURS AFTER INGESTION AND >50 ug/mL AT 12 HOURS AFTER INGESTION ARE OFTEN ASSOCIATED WITH TOXIC REACTIONS.   Salicylate level     Status: None   Collection Time: 07/09/16 12:29 AM  Result Value Ref Range   Salicylate Lvl <1.8 2.8 - 30.0 mg/dL  I-stat chem 8, ed     Status: Abnormal   Collection Time: 07/09/16  1:02 AM  Result  Value Ref Range   Sodium 145 135 - 145 mmol/L   Potassium 3.7 3.5 - 5.1 mmol/L   Chloride 105 101 - 111 mmol/L   BUN 24 (H) 6 - 20 mg/dL   Creatinine, Ser 0.90 0.61 - 1.24 mg/dL   Glucose, Bld 78 65 - 99 mg/dL   Calcium, Ion 1.24 1.15 - 1.40 mmol/L   TCO2 22 0 - 100 mmol/L   Hemoglobin 17.0 13.0 - 17.0 g/dL   HCT 50.0 39.0 - 52.0 %  I-stat chem 8, ed     Status: None   Collection Time: 07/09/16  2:45 PM  Result Value Ref Range   Sodium 145 135 - 145 mmol/L   Potassium 4.1 3.5 - 5.1 mmol/L   Chloride 108 101 - 111 mmol/L   BUN 20 6 - 20 mg/dL   Creatinine, Ser 0.90 0.61 - 1.24 mg/dL   Glucose, Bld 82 65 - 99 mg/dL   Calcium, Ion 1.22 1.15 - 1.40 mmol/L   TCO2 20 0 - 100 mmol/L   Hemoglobin 16.7 13.0 - 17.0 g/dL   HCT 49.0 39.0 - 52.0 %    Current Facility-Administered Medications  Medication Dose Route Frequency Provider Last Rate Last Dose  . acetaminophen (TYLENOL) tablet 650 mg  650 mg Oral Q4H PRN April Palumbo, MD      . alum & mag hydroxide-simeth (MAALOX/MYLANTA) 200-200-20 MG/5ML suspension 30 mL  30 mL Oral PRN April Palumbo, MD      . ibuprofen (ADVIL,MOTRIN) tablet 600 mg  600 mg Oral Q8H PRN April Palumbo, MD      . LORazepam (ATIVAN) injection 2 mg  2 mg Intramuscular Q4H PRN Patrecia Pour, NP       Or  . LORazepam (ATIVAN) tablet 2 mg  2 mg Oral Q4H PRN Patrecia Pour, NP   2 mg at 07/10/16 1304  . LORazepam (ATIVAN) tablet 2 mg  2 mg Oral Q4H Patrecia Pour, NP      . ondansetron [facility]) tablet 4 mg  4 mg Oral Q8H PRN April Palumbo, MD       Current Outpatient Prescriptions  Medication Sig Dispense Refill  . benztropine (COGENTIN) 1 MG tablet Take 1 tablet (1 mg total) by mouth at bedtime. For possible side effects. 30 tablet 0  . OLANZapine (ZYPREXA) 10 MG tablet Take 1 tablet (10 mg total) by mouth at bedtime. For psychosis and depression. 30 tablet 0    Musculoskeletal: Strength & Muscle Tone: flaccid Gait & Station: unable to stand Patient leans:  N/A  Psychiatric Specialty Exam: Physical Exam  Constitutional: He appears well-developed and well-nourished.  Respiratory: Effort normal.  Neurological: He is alert.  Psychiatric: Cognition and memory are impaired. He exhibits a depressed mood. He is noncommunicative.  Assessment based on his history    Review of Systems  Psychiatric/Behavioral: Positive for depression.    Blood pressure 130/84, pulse 80, temperature 98 F (36.7 C), resp. rate 18, SpO2 98 %.There is no height or weight on file to calculate BMI.  General Appearance: Casual  Eye Contact:  None  Speech:  Negative  Volume:  no speech  Mood:  Depressed based on history  Affect:  Flat  Thought Process:  Unable to assess  Orientation:  Unable to assess  Thought Content:  Unable to assess  Suicidal Thoughts:  Unable to assess  Homicidal Thoughts:  Unable to assess  Memory:  Unable to assess  Judgement:  Unable to assess  Insight:  Unable to assess  Psychomotor Activity:  Unable to assess  Concentration:  Unable to assess  Recall:  Unable to assess  Fund of Knowledge:  Unable to assess  Language:  Negative  Akathisia:  Unable to assess  Handed:  Unable to assess  AIMS (if indicated):     Assets:  Housing Leisure Time Physical Health Resilience Social Support  ADL's:  Impaired  Cognition:  Impaired,  Severe  Sleep:        Treatment Plan Summary: Daily contact with patient to assess and evaluate symptoms and progress in treatment, Medication management and Plan major depressive disorder, severe, with catatonia:  -Crisis stabilization -Medication management:  Continue Ativan 2 mg IM for catatonia -Individual counseling  Disposition: Medical admission with psychiatric consult  Waylan Boga, NP 07/10/2016 1:04 PM  Patient seen face-to-face for psychiatric evaluation, chart reviewed and case discussed with the physician extender and developed treatment plan. Reviewed the information documented and agree  with the treatment plan. Corena Pilgrim, MD

## 2016-07-10 NOTE — ED Notes (Signed)
Pt has not distress at this time.  He explains his situation to me, no SI or HI.  He states that he just has these catatonic stress responses, he has been feeling much better and feels that the ativan which has been given regularly has been helping.  Pt is ambulatory in room, unit and to the bathroom where he is currently showering.  I offered the pt a shower which he was very happy to take, gave him soap, deodorant, toothpaste, lotion and new paper scrubs.  Pt ate a late night snack and is feeling well now

## 2016-07-10 NOTE — BH Assessment (Signed)
BHH Assessment Progress Note  Per Thedore MinsMojeed Akintayo, MD, this pt requires psychiatric hospitalization.  Lillia AbedLindsay, RN, [facility]; she will call when they are ready for pt.  Pt presents under IVC upheld by Dr Jannifer FranklinAkintayo, and [facility].  Pt's nurse has been notified, and agrees to call report to [phone].  Pt is to be transported via [employer].   Doylene Canninghomas Jacki Couse, MA Triage Specialist [phone]

## 2016-07-10 NOTE — BH Assessment (Signed)
BHH Assessment Progress Note  Per Thedore MinsMojeed Akintayo, MD, this pt requires psychiatric hospitalization.  Lillia AbedLindsay RN, [facility] reports that [facility] has been accepted to [facility] by Dr Toni Amendlapacs.  Pt presents under IVC, and IVC documents have been faxed to to [facility].  Pt's nurse has been notified, and agrees to call report to [phone].  [place]   Doylene Canninghomas Keylee Shrestha, MA Triage Specialist [phone]

## 2016-07-10 NOTE — ED Notes (Signed)
Pt is still a 2 assist with walking due to unsteadiness. TTS made aware and will re-eval in the morning. Halma called and updated.

## 2016-07-10 NOTE — ED Notes (Signed)
Sheriff called for transport to [facility]. It will be a few hours before they are available.

## 2016-07-10 NOTE — ED Notes (Signed)
Pt very weak and unable to stand and walk without assistance. Charge nurse notified and transport cancelled.

## 2016-07-10 NOTE — ED Notes (Signed)
Walked with patient and determined he was too weak to move back to [facility]. Notified charge nurse.

## 2016-07-10 NOTE — BH Assessment (Signed)
Writer [facility]) informed by [facility] Raoul Pitch(Sierra), that the patient's transfer/[facility] (07/10/2016) was "cancelled" for today (07/10/2016) by their medical staff. He will remain in their ER overnight and be evaluated tomorrow (07/11/2016) for possible [facility].  Writer informed [facility] RN of the conversation Jamesetta So(Phyllis).

## 2016-07-11 DIAGNOSIS — F332 Major depressive disorder, recurrent severe without psychotic features: Secondary | ICD-10-CM

## 2016-07-11 DIAGNOSIS — Z79899 Other long term (current) drug therapy: Secondary | ICD-10-CM | POA: Diagnosis not present

## 2016-07-11 DIAGNOSIS — F061 Catatonic disorder due to known physiological condition: Secondary | ICD-10-CM | POA: Diagnosis not present

## 2016-07-11 DIAGNOSIS — Z87891 Personal history of nicotine dependence: Secondary | ICD-10-CM

## 2016-07-11 MED ORDER — CITALOPRAM HYDROBROMIDE 10 MG PO TABS
10.0000 mg | ORAL_TABLET | Freq: Every day | ORAL | Status: DC
  Administered 2016-07-11: 10 mg via ORAL
  Filled 2016-07-11: qty 1

## 2016-07-11 MED ORDER — CITALOPRAM HYDROBROMIDE 10 MG PO TABS: 10.0000 mg | ORAL_TABLET | Freq: Every day | ORAL | 0 refills | Status: DC

## 2016-07-11 NOTE — Progress Notes (Signed)
Responding to request from nursing.  Pt in need of jacket for discharge.     Spiritual care delivered [facility].    Belva CromeStalnaker, Concettina Leth Wayne MDiv

## 2016-07-11 NOTE — ED Notes (Signed)
Patient is alert and oriented. Calm and corporative. Patient is wanting to go home. No SI The patient is up walking around with no problems stable on his feet. Has made several phone calls.

## 2016-07-11 NOTE — BH Assessment (Addendum)
BHH Assessment Progress Note  Per Thedore MinsMojeed Akintayo, MD, this pt does not require psychiatric hospitalization at this time.  Pt presents [facility] which Dr Jannifer FranklinAkintayo has rescinded.  [facility].  This has been included in pt's discharge instructions.  Pt's nurse, has been notified.  Doylene Canninghomas Satya Buttram, MA Triage Specialist [phone]   Addendum:  After speaking with pt's nurse, a bus pass was provided for the pt, and homeless shelter information was added to pt's discharge instructions.  Doylene Canninghomas Dalis Beers, MA Triage Specialist [phone]

## 2016-07-11 NOTE — BHH Suicide Risk Assessment (Signed)
Suicide Risk Assessment  Discharge Assessment   [facility]   Principal Problem: Major depressive disorder, recurrent episode, severe with catatonia [facility](HCC) Discharge Diagnoses:  Patient Active Problem List   Diagnosis Date Noted  . 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 Malingering [Z76.5] 09/29/2012  . 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 Bradycardia [R00.1] 09/18/2012  . Decreased oral intake [R63.8] 09/17/2012  . Psychosis [F29] 09/09/2012  . Severe episode of recurrent major depressive disorder, with psychotic features (HCC) [F33.3] 09/08/2012  . History of substance abuse [Z87.898] 09/08/2012    Total Time spent with patient: 15 minutes  Musculoskeletal: Strength & Muscle Tone: within normal limits Gait & Station: normal Patient leans: N/A  Psychiatric Specialty Exam:   Blood pressure 129/76, pulse 102, temperature 97.9 F (36.6 C), temperature source Oral, resp. rate 20, SpO2 94 %.There is no height or weight on file to calculate BMI.   General Appearance: Fairly Groomed  Eye Contact:  Good  Speech:  Clear and Coherent and Normal Rate  Volume:  Normal  Mood:  Pleasant, calm and cooperative  Affect:  Congruent  Thought Process:  Coherent and Goal Directed  Orientation:  Full (Time, Place, and Person)  Thought Content:  Logical  Suicidal Thoughts:  No  Homicidal Thoughts:  No  Memory:  Immediate;   Good Recent;   Good  Judgement:  Intact  Insight:  Fair  Psychomotor Activity:  Normal  Concentration:  Concentration: Good and Attention Span: Good  Recall:  Good  Fund of Knowledge:  Good  Language:  Good  Akathisia:  No  Handed:    AIMS (if indicated):     Assets:  Communication Skills Desire for Improvement Physical Health Resilience  ADL's:  Intact  Cognition:  WNL  Sleep:      Mental Status Per Nursing Assessment::    On Admission:     Demographic Factors:  Male, Caucasian and Unemployed  Loss Factors: Legal issues and Financial problems/change in socioeconomic status  Historical Factors: NA  Risk Reduction Factors:   Responsible for children under 10918 years of age, Sense of responsibility to family and Living with another person, especially a relative  Continued Clinical Symptoms:  Depression, mild  Cognitive Features That Contribute To Risk:  None    Suicide Risk:  Minimal: No identifiable suicidal ideation.  Patients presenting with no risk factors but with morbid ruminations; may be classified as minimal risk based on the severity of the depressive symptoms   Plan Of Care/Follow-up recommendations:  Activity:  As tolerated Diet:  Regular  1.Take all your medications as prescribed.  2. Report any adverse side effects to your medication to your outpatient provider. 3. Do not use alcohol or illegal drugs while taking prescription medications. 4. In the event of worsening symptoms, call 911, the crisis hotline or go to nearest emergency room for evaluation of symptoms.  Benjamin SamFran Lycan Davee, FNP-BC [facility] 07/11/2016, 11:08 AM

## 2016-07-11 NOTE — Discharge Instructions (Addendum)
Major Depressive Disorder, Adult Major depressive disorder (MDD) is a mental health condition. It may also be called clinical depression or unipolar depression. MDD usually causes feelings of sadness, hopelessness, or helplessness. MDD can also cause physical symptoms. It can interfere with work, school, relationships, and other everyday activities. MDD may be mild, moderate, or severe. It may occur once (single episode major depressive disorder) or it may occur multiple times (recurrent major depressive disorder). What are the causes? The exact cause of this condition is not known. MDD is most likely caused by a combination of things, which may include:  Genetic factors. These are traits that are passed along from parent to child.  Individual factors. Your personality, your behavior, and the way you handle your thoughts and feelings may contribute to MDD. This includes personality traits and behaviors learned from others.  Physical factors, such as:  Differences in the part of your brain that controls emotion. This part of your brain may be different than it is in people who do not have MDD.  Long-term (chronic) medical or psychiatric illnesses.  Social factors. Traumatic experiences or major life changes may play a role in the development of MDD. What increases the risk? This condition is more likely to develop in women. The following factors may also make you more likely to develop MDD:  A family history of depression.  Troubled family relationships.  Abnormally low levels of certain brain chemicals.  Traumatic events in childhood, especially abuse or the loss of a parent.  Being under a lot of stress, or long-term stress, especially from upsetting life experiences or losses.  A history of:  Chronic physical illness.  Other mental health disorders.  Substance abuse.  Poor living conditions.  Experiencing social exclusion or discrimination on a regular basis. What are  the signs or symptoms? The main symptoms of MDD typically include:  Constant depressed or irritable mood.  Loss of interest in things and activities. MDD symptoms may also include:  Sleeping or eating too much or too little.  Unexplained weight change.  Fatigue or low energy.  Feelings of worthlessness or guilt.  Difficulty thinking clearly or making decisions.  Thoughts of suicide or of harming others.  Physical agitation or weakness.  Isolation. Severe cases of MDD may also occur with other symptoms, such as:  Delusions or hallucinations, in which you imagine things that are not real (psychotic depression).  Low-level depression that lasts at least a year (chronic depression or persistent depressive disorder).  Extreme sadness and hopelessness (melancholic depression).  Trouble speaking and moving (catatonic depression). How is this diagnosed? This condition may be diagnosed based on:  Your symptoms.  Your medical history, including your mental health history. This may involve tests to evaluate your mental health. You may be asked questions about your lifestyle, including any drug and alcohol use, and how long you have had symptoms of MDD.  A physical exam.  Blood tests to rule out other conditions. You must have a depressed mood and at least four other MDD symptoms most of the day, nearly every day in the same 2-week timeframe before your health care provider can confirm a diagnosis of MDD. How is this treated? This condition is usually treated by mental health professionals, such as psychologists, psychiatrists, and clinical social workers. You may need more than one type of treatment. Treatment may include:  Psychotherapy. This is also called talk therapy or counseling. Types of psychotherapy include:  Cognitive behavioral therapy (CBT). This type of  therapy teaches you to recognize unhealthy feelings, thoughts, and behaviors, and replace them with positive  thoughts and actions.  Interpersonal therapy (IPT). This helps you to improve the way you relate to and communicate with others.  Family therapy. This treatment includes members of your family.  Medicine to treat anxiety and depression, or to help you control certain emotions and behaviors.  Lifestyle changes, such as:  Limiting alcohol and drug use.  Exercising regularly.  Getting plenty of sleep.  Making healthy eating choices.  Spending more time outdoors. Treatments involving stimulation of the brain can be used in situations with extremely severe symptoms, or when medicine or other therapies do not work over time. These treatments include electroconvulsive therapy, transcranial magnetic stimulation, and vagal nerve stimulation. Follow these instructions at home: Activity  Return to your normal activities as told by your health care provider.  Exercise regularly and spend time outdoors as told by your health care provider. 1.Take all your medications as prescribed.  2. Report any adverse side effects to your medication to your outpatient provider. 3. Do not use alcohol or illegal drugs while taking prescription medications.  4. In the event of worsening symptoms, call 911, the crisis hotline or go to nearest emergency room for evaluation of symptoms. General instructions  Take over-the-counter and prescription medicines only as told by your health care provider.  Do not drink alcohol. If you drink alcohol, limit your alcohol intake to no more than 1 drink a day for nonpregnant women and 2 drinks a day for men. One drink equals 12 oz of beer, 5 oz of wine, or 1 oz of hard liquor. Alcohol can affect any antidepressant medicines you are taking. Talk to your health care provider about your alcohol use.  Eat a healthy diet and get plenty of sleep.  Find activities that you enjoy doing, and make time to do them.  Consider joining a support group. Your health care provider  may be able to recommend a support group.  Keep all follow-up visits as told by your health care provider. This is important. Where to find more information: The [employer] on Mental Illness  www.nami.org U.[employer] of Mental Health  http://www.maynard.net/www.nimh.nih.gov National Suicide Prevention Lifeline  1-800-273-TALK 848-627-2646(8255). This is free, 24-hour help. Contact a health care provider if:  Your symptoms get worse.  You develop new symptoms. Get help right away if:  You self-harm.  You have serious thoughts about hurting yourself or others.  You see, hear, taste, smell, or feel things that are not present (hallucinate). This information is not intended to replace advice given to you by your health care provider. Make sure you discuss any questions you have with your health care provider. Document Released: 11/15/2012 Document Revised: 03/27/2016 Document Reviewed: 01/30/2016 Elsevier Interactive Patient Education  2017 [employer]   ________________________________________________________________________________________________ For your ongoing mental health needs, you are advised to follow up with [facility].  New and returning patients are seen at their walk-in clinic.  Walk-in hours are Monday - Friday from 8:00 am - 3:00 pm.  Walk-in patients are seen on a first come, first served basis.  Try to arrive as early as possible for he best chance of being seen the same day:       [facility]      [address]      [place], [place] [address]      [phone]  For your shelter needs, contact the following service providers:  [facility]      [address]      [place], [place] [address]      [phone]       [facility]      [address]      [place], [place] [address]      [phone]

## 2016-07-11 NOTE — Consult Note (Signed)
[facility]   Reason for Consult:  Psychiatric Re-evaluation  Referring Physician:  EDP Patient Identification: Benjamin HensenJoshua [name] MRN:  [id number] Principal Diagnosis: Major depressive disorder, recurrent episode, severe with catatonia [facility](HCC) Diagnosis:   Patient Active Problem List   Diagnosis Date Noted  . 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 Malingering [Z76.5] 09/29/2012  . 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 Bradycardia [R00.1] 09/18/2012  . Decreased oral intake [R63.8] 09/17/2012  . Psychosis [F29] 09/09/2012  . Severe episode of recurrent major depressive disorder, with psychotic features (HCC) [F33.3] 09/08/2012  . History of substance abuse [Z87.898] 09/08/2012    Total Time spent with patient: 20 minutes  Subjective:   Benjamin [name] is a 35 y.o. male patient who states "I am feeling a lot better."  HPI:  Per behavioral health therapeutic triage assessment, Benjamin [name] is an 35 y.o. male, who [facility] Pt did not speak to clinician during assessment, information obtained through IVC paperwork and chart notes. Pts father of grandchilden took out IVC paperwork. Per IVC paperwork: "Respondent has been in the bed for at least two days. He will not tend to his children who are 4 & 8. The children are taking care of themselves. He is not talking, eating, drinking or bathing. He speaks of death in the afterlife. He will not put the bible and continue to read from it for the last 2 days."  "This is a chronicproblem. The current episode started more than 1 week ago. The problem occurs constantly. The problem has been rapidly worsening. Pertinent negatives include no abdominal pain. Nothingaggravates the symptoms. Nothingrelieves the symptoms. He has tried nothingfor the symptoms. The treatment provided  norelief. Benjamin [name] a 35 y.o.malewho presents to the Emergency Department, here due to SI and unresponsiveness. Per EMS, [facility] and medical records, pt is mentally ill, under IVC taken out by family member, and has been in bed for 2 days not tending to children, not talking, eating, drinking or bathing, and has spoken about death and the after life. Per [employer], pt had to be dragged from his bed and did not speak." Clinician was unable to assess the pt's abuse history. Pt's UDS is positive for marijuana. Clinician was unable to assess if pt has previous inpatient admissions. Pt was lying in the bed looking at the ceiling in scrubs with elective mutism. Pt's eye contact was poor. Clinician was unable to assess pt's judgement, thought process, orientation, concentration, memory, insight and impulse control.   [facility] evaluation on 07/11/16: Chart and nursing notes reviewed. Face-to-face evaluation completed with Dr. Jannifer FranklinAkintayo. The patient is alert, oriented x 4, calm and cooperative. He is sitting up in a chair in his room. He is able to communicate with the psychiatric team without difficulty. 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 Today, he denies suicidal or homicidal ideation, intent or plan. He denies AVH.   Past Psychiatric History: Depression  Risk to Self: Suicidal ideation - No (as of 07/11/16) Risk to Others: Homicidal Ideation:  (UTA) Thoughts of Harm to Others:  (UTA) Current Homicidal Intent:  (UTA) Current Homicidal Plan:  (UTA) Access to Homicidal Means:  (UTA) Identified Victim: NA History of harm to others?:  (UTA) Assessment of Violence:  (UTA) Violent Behavior Description: NA Does patient have access to weapons?:  (UTA) Criminal  Charges Pending?:  (UTA) Does patient have a court date:  (UTA) Prior Inpatient Therapy: Prior Inpatient Therapy:  (UTA) Prior Therapy Dates: UTA Prior Therapy  Facilty/Provider(s): UTA Reason for Treatment: UTA Prior Outpatient Therapy: Prior Outpatient Therapy:  (UTA) Prior Therapy Dates: UTA Prior Therapy Facilty/Provider(s): UTA Reason for Treatment: UTA Does patient have an ACCT team?: Unknown Does patient have Intensive In-House Services?  : Unknown Does patient have [facility] services? : Unknown Does patient have P4CC services?: Unknown  Past Medical History:  Past Medical History:  Diagnosis Date  . Depression   . Herniated disc    x3  . Hypertension   . Mental disorder     Past Surgical History:  Procedure Laterality Date  . SHOULDER SURGERY     Family History: No family history on file. Family Psychiatric  History: unknown Social History:  History  Alcohol Use No     History  Drug Use No    Social History   Social History  . Marital status: Single    Spouse name: N/A  . Number of children: N/A  . Years of education: N/A   Social History Main Topics  . Smoking status: Former Smoker    Packs/day: 1.00    Types: Cigarettes    Quit date: 04/04/2012  . Smokeless tobacco: Never Used  . Alcohol use No  . Drug use: No  . Sexual activity: No   Other Topics Concern  . None   Social History Narrative  . None   Additional Social History:    Allergies:  No Known Allergies  Labs:  Results for orders placed or performed during the hospital encounter of 07/08/16 (from the past 48 hour(s))  I-stat chem 8, ed     Status: None   Collection Time: 07/09/16  2:45 PM  Result Value Ref Range   Sodium 145 135 - 145 mmol/L   Potassium 4.1 3.5 - 5.1 mmol/L   Chloride 108 101 - 111 mmol/L   BUN 20 6 - 20 mg/dL   Creatinine, Ser 1.61 0.61 - 1.24 mg/dL   Glucose, Bld 82 65 - 99 mg/dL   Calcium, Ion 0.96 0.45 - 1.40 mmol/L   TCO2 20 0 - 100 mmol/L   Hemoglobin 16.7 13.0 - 17.0 g/dL   HCT 40.9 81.1 - 91.4 %    Current Facility-Administered Medications  Medication Dose Route Frequency Provider Last Rate Last Dose  .  acetaminophen (TYLENOL) tablet 650 mg  650 mg Oral Q4H PRN April Palumbo, MD      . alum & mag hydroxide-simeth (MAALOX/MYLANTA) 200-200-20 MG/5ML suspension 30 mL  30 mL Oral PRN April Palumbo, MD      . ibuprofen (ADVIL,MOTRIN) tablet 600 mg  600 mg Oral Q8H PRN April Palumbo, MD      . LORazepam (ATIVAN) injection 2 mg  2 mg Intramuscular Q4H PRN Charm Rings, NP       Or  . LORazepam (ATIVAN) tablet 2 mg  2 mg Oral Q4H PRN Charm Rings, NP   2 mg at 07/10/16 2147  . ondansetron (ZOFRAN) tablet 4 mg  4 mg Oral Q8H PRN April Palumbo, MD       Current Outpatient Prescriptions  Medication Sig Dispense Refill  . benztropine (COGENTIN) 1 MG tablet Take 1 tablet (1 mg total) by mouth at bedtime. For possible side effects. 30 tablet 0  . OLANZapine (ZYPREXA) 10 MG tablet Take 1 tablet (10 mg total) by mouth at bedtime. For psychosis  and depression. 30 tablet 0    Musculoskeletal: Strength & Muscle Tone: within normal limits Gait & Station: normal Patient leans: N/A  Psychiatric Specialty Exam: Physical Exam  Nursing note and vitals reviewed.   Review of Systems  Psychiatric/Behavioral: Negative for suicidal ideas.  All other systems reviewed and are negative.   Blood pressure 129/76, pulse 102, temperature 97.9 F (36.6 C), temperature source Oral, resp. rate 20, SpO2 94 %.There is no height or weight on file to calculate BMI.  General Appearance: Fairly Groomed  Eye Contact:  Good  Speech:  Clear and Coherent and Normal Rate  Volume:  Normal  Mood:  Pleasant, calm and cooperative  Affect:  Congruent  Thought Process:  Coherent and Goal Directed  Orientation:  Full (Time, Place, and Person)  Thought Content:  Logical  Suicidal Thoughts:  No  Homicidal Thoughts:  No  Memory:  Immediate;   Good Recent;   Good  Judgement:  Intact  Insight:  Fair  Psychomotor Activity:  Normal  Concentration:  Concentration: Good and Attention Span: Good  Recall:  Good  Fund of Knowledge:   Good  Language:  Good  Akathisia:  No  Handed:    AIMS (if indicated):     Assets:  Communication Skills Desire for Improvement Physical Health Resilience  ADL's:  Intact  Cognition:  WNL  Sleep:       Case discussed with Dr. Jannifer FranklinAkintayo; recommendations are:  Disposition: No evidence of imminent risk to self or others at present.   Patient does not meet criteria for psychiatric inpatient admission. Supportive therapy provided about ongoing stressors.  [facility] for Celexa 10 mg #30 provided at time of discharge.  1.Take all your medications as prescribed.  2. Report any adverse side effects to your medication to your outpatient provider. 3. Do not use alcohol or illegal drugs while taking prescription medications. 4. In the event of worsening symptoms, call 911, the crisis hotline or go to nearest emergency room for evaluation of symptoms.  Alberteen SamFran Hobson, FNP-BC [facility] 07/11/2016 11:07 AM  Patient seen face-to-face for psychiatric evaluation, chart reviewed and case discussed with the physician extender and developed treatment plan. Reviewed the information documented and agree with the treatment plan. Thedore MinsMojeed Gurbani Figge, MD

## 2016-07-14 ENCOUNTER — Emergency Department (HOSPITAL_COMMUNITY)
Admission: EM | Admit: 2016-07-14 | Discharge: 2016-07-14 | Disposition: A | Discharge status: Other Acute Inpatient Hospital | Payer: Self-pay | Attending: Emergency Medicine | Admitting: Emergency Medicine

## 2016-07-14 ENCOUNTER — Inpatient Hospital Stay
Admission: AD | Admit: 2016-07-14 | Discharge: 2016-07-18 | DRG: 885 | Disposition: A | Discharge status: Home / Self Care | Payer: No Typology Code available for payment source | Source: Intra-hospital | Attending: Psychiatry | Admitting: Psychiatry

## 2016-07-14 ENCOUNTER — Encounter (HOSPITAL_COMMUNITY): Payer: Self-pay | Admitting: Nurse Practitioner

## 2016-07-14 ENCOUNTER — Encounter: Payer: Self-pay | Admitting: Psychiatry

## 2016-07-14 DIAGNOSIS — Z818 Family history of other mental and behavioral disorders: Secondary | ICD-10-CM

## 2016-07-14 DIAGNOSIS — I10 Essential (primary) hypertension: Secondary | ICD-10-CM | POA: Diagnosis present

## 2016-07-14 DIAGNOSIS — F061 Catatonic disorder due to known physiological condition: Secondary | ICD-10-CM | POA: Diagnosis present

## 2016-07-14 DIAGNOSIS — Z87891 Personal history of nicotine dependence: Secondary | ICD-10-CM

## 2016-07-14 DIAGNOSIS — F429 Obsessive-compulsive disorder, unspecified: Secondary | ICD-10-CM | POA: Diagnosis present

## 2016-07-14 DIAGNOSIS — Z79899 Other long term (current) drug therapy: Secondary | ICD-10-CM | POA: Insufficient documentation

## 2016-07-14 DIAGNOSIS — F4310 Post-traumatic stress disorder, unspecified: Secondary | ICD-10-CM | POA: Diagnosis present

## 2016-07-14 DIAGNOSIS — F332 Major depressive disorder, recurrent severe without psychotic features: Principal | ICD-10-CM | POA: Diagnosis present

## 2016-07-14 DIAGNOSIS — Z9889 Other specified postprocedural states: Secondary | ICD-10-CM

## 2016-07-14 DIAGNOSIS — F202 Catatonic schizophrenia: Secondary | ICD-10-CM | POA: Insufficient documentation

## 2016-07-14 LAB — COMPREHENSIVE METABOLIC PANEL
ALBUMIN: 5.1 g/dL — AB (ref 3.5–5.0)
ALT: 22 U/L (ref 17–63)
AST: 23 U/L (ref 15–41)
Alkaline Phosphatase: 43 U/L (ref 38–126)
Anion gap: 13 (ref 5–15)
BILIRUBIN TOTAL: 1.7 mg/dL — AB (ref 0.3–1.2)
BUN: 28 mg/dL — AB (ref 6–20)
CHLORIDE: 102 mmol/L (ref 101–111)
CO2: 24 mmol/L (ref 22–32)
Calcium: 9.9 mg/dL (ref 8.9–10.3)
Creatinine, Ser: 0.72 mg/dL (ref 0.61–1.24)
GFR calc Af Amer: >60 mL/min (ref >60)
GFR calc non Af Amer: >60 mL/min (ref >60)
GLUCOSE: 82 mg/dL (ref 65–99)
POTASSIUM: 4.4 mmol/L (ref 3.5–5.1)
Sodium: 139 mmol/L (ref 135–145)
TOTAL PROTEIN: 8.5 g/dL — AB (ref 6.5–8.1)

## 2016-07-14 LAB — CBC WITH DIFFERENTIAL/PLATELET
BASOS ABS: 0 10*3/uL (ref 0.0–0.1)
BASOS PCT: 0 %
Eosinophils Absolute: 0 10*3/uL (ref 0.0–0.7)
Eosinophils Relative: 0 %
HEMATOCRIT: 51.7 % (ref 39.0–52.0)
Hemoglobin: 18.2 g/dL — ABNORMAL HIGH (ref 13.0–17.0)
Lymphocytes Relative: 11 %
Lymphs Abs: 1.2 10*3/uL (ref 0.7–4.0)
MCH: 31.2 pg (ref 26.0–34.0)
MCHC: 35.2 g/dL (ref 30.0–36.0)
MCV: 88.7 fL (ref 78.0–100.0)
MONO ABS: 0.4 10*3/uL (ref 0.1–1.0)
Monocytes Relative: 4 %
NEUTROS ABS: 8.8 10*3/uL — AB (ref 1.7–7.7)
NEUTROS PCT: 85 %
Platelets: 262 10*3/uL (ref 150–400)
RBC: 5.83 MIL/uL — AB (ref 4.22–5.81)
RDW: 12.7 % (ref 11.5–15.5)
WBC: 10.4 10*3/uL (ref 4.0–10.5)

## 2016-07-14 LAB — RAPID URINE DRUG SCREEN, HOSP PERFORMED
Amphetamines: NOT DETECTED
BARBITURATES: NOT DETECTED
Benzodiazepines: POSITIVE — AB
COCAINE: NOT DETECTED
Opiates: NOT DETECTED
TETRAHYDROCANNABINOL: POSITIVE — AB

## 2016-07-14 LAB — ETHANOL: Alcohol, Ethyl (B): <5 mg/dL (ref <5)

## 2016-07-14 MED ORDER — HYDROXYZINE HCL 25 MG PO TABS: 25.0000 mg | ORAL_TABLET | Freq: Three times a day (TID) | ORAL | Status: DC | PRN

## 2016-07-14 MED ORDER — LORAZEPAM 2 MG/ML IJ SOLN
2.0000 mg | INTRAMUSCULAR | Status: DC
  Administered 2016-07-15 – 2016-07-16 (×4): 2 mg via INTRAMUSCULAR
  Filled 2016-07-14 (×4): qty 1

## 2016-07-14 MED ORDER — CITALOPRAM HYDROBROMIDE 20 MG PO TABS
10.0000 mg | ORAL_TABLET | Freq: Every day | ORAL | Status: DC
  Administered 2016-07-15: 10 mg via ORAL
  Filled 2016-07-14: qty 1

## 2016-07-14 MED ORDER — LORAZEPAM 2 MG/ML IJ SOLN
2.0000 mg | INTRAMUSCULAR | Status: DC
  Administered 2016-07-14: 2 mg via INTRAMUSCULAR
  Filled 2016-07-14: qty 1

## 2016-07-14 MED ORDER — CITALOPRAM HYDROBROMIDE 10 MG PO TABS: 10.0000 mg | ORAL_TABLET | Freq: Every day | ORAL | Status: DC

## 2016-07-14 MED ORDER — TRAZODONE HCL 100 MG PO TABS: 100.0000 mg | ORAL_TABLET | Freq: Every evening | ORAL | Status: DC | PRN

## 2016-07-14 MED ORDER — MAGNESIUM HYDROXIDE 400 MG/5ML PO SUSP: 30.0000 mL | Freq: Every day | ORAL | Status: DC | PRN

## 2016-07-14 MED ORDER — ACETAMINOPHEN 325 MG PO TABS
650.0000 mg | ORAL_TABLET | Freq: Four times a day (QID) | ORAL | Status: DC | PRN
  Administered 2016-07-15: 650 mg via ORAL
  Filled 2016-07-14: qty 2

## 2016-07-14 MED ORDER — LORAZEPAM 2 MG PO TABS
2.0000 mg | ORAL_TABLET | ORAL | Status: DC
  Administered 2016-07-15 – 2016-07-18 (×18): 2 mg via ORAL
  Filled 2016-07-14 (×18): qty 1

## 2016-07-14 MED ORDER — ALUM & MAG HYDROXIDE-SIMETH 200-200-20 MG/5ML PO SUSP: 30.0000 mL | ORAL | Status: DC | PRN

## 2016-07-14 MED ORDER — LORAZEPAM 1 MG PO TABS: 2.0000 mg | ORAL_TABLET | ORAL | Status: DC

## 2016-07-14 MED ORDER — LORAZEPAM 2 MG/ML IJ SOLN: 2.0000 mg | INTRAMUSCULAR | Status: DC

## 2016-07-14 MED ORDER — LORAZEPAM 2 MG PO TABS: 2.0000 mg | ORAL_TABLET | ORAL | Status: DC

## 2016-07-14 NOTE — ED Triage Notes (Signed)
Family called EMS due to not hearing from him in a couple of days. He was just in the hospital last week in the same catatonic state.

## 2016-07-14 NOTE — BH Assessment (Addendum)
Assessment Note  Benjamin [name] is an 35 y.o. male. Clinician was unable to assess patient. Pt was lying in the bed looking at the ceiling in scrubs with elective mutism. He did not respond to this Clinical research associate in any manner. Pt's eye contact was poor. Clinician was unable to assess pt's judgement, thought process, orientation, concentration, memory, insight and impulse control.  Per ED notes, "Family called EMS due to not hearing from him in a couple of days. He was just in the hospital last week in the same catatonic state." Due to patient's presentation writer unable to confirm of deny SI, HI, and AV's. Patient unable to provide any history.   Dr. Eliot Ford note: "Patient who has extensive psychiatric history including catatonia who presents in a catatonic state from home. Family had called EMS due to not hearing from the patient in a few days. Patient would not medicate with me at this time and history is per the old records as well as EMS. He was just discharged from the hospital for similar symptoms 2 days ago. I spoke with the psychiatrist who saw the patient at that time he states that he is concerned that there may be secondary gain due to the patient's history of sudden reversal of his catatonia during his last stay here. At time of discharged 3 days ago, patient was at his baseline."      Diagnosis:   Past Medical History:  Past Medical History:  Diagnosis Date  . Depression   . Herniated disc    x3  . Hypertension   . Mental disorder     Past Surgical History:  Procedure Laterality Date  . SHOULDER SURGERY      Family History: History reviewed. No pertinent family history.  Social History:  reports that he quit smoking about 4 years ago. His smoking use included Cigarettes. He smoked 1.00 pack per day. He has never used smokeless tobacco. He reports that he does not drink alcohol or use drugs.  Additional Social History:  Alcohol / Drug Use Pain Medications: See  MAR Prescriptions: See MAR Over the Counter: See MAR History of alcohol / drug use?:  (UTA) Negative Consequences of Use: Financial, Personal relationships, Work / Programmer, multimedia, Armed forces operational officer (UTA)  CIWA: CIWA-Ar BP: 145/89 Pulse Rate: 67 COWS:    Allergies: No Known Allergies  Home Medications:  (Not in a hospital admission)  OB/GYN Status:  No LMP for male patient.  General Assessment Data Location of Assessment: [facility] TTS Assessment: In system Is this a Tele or Face-to-Face Assessment?: Face-to-Face Is this an Initial Assessment or a Re-assessment for this encounter?: Initial Assessment Marital status: Other (comment) (unk) Maiden name:  (n/a) Is patient pregnant?: No Pregnancy Status: No Living Arrangements: Other (Comment) (unk) Can pt return to current living arrangement?:  (unk) Admission Status: Other (Comment) (unk) Is patient capable of signing voluntary admission?:  (unk) Referral Source: Other (unk) Insurance type:  (Self Pay )     Crisis Care Plan Living Arrangements: Other (Comment) (unk) Legal Guardian: Other: Name of Psychiatrist: UTA Name of Therapist: UTA  Education Status Is patient currently in school?: No Current Grade:  (n/a) Highest grade of school patient has completed: NA (unk) Name of school: NA Contact person: NA  Risk to self with the past 6 months Suicidal Ideation:  (unk) Has patient been a risk to self within the past 6 months prior to admission? :  (unk) Suicidal Intent:  (unk) Has patient had any suicidal intent within the  past 6 months prior to admission? :  (unk) Is patient at risk for suicide?:  (unk) Suicidal Plan?:  (unk) Has patient had any suicidal plan within the past 6 months prior to admission? :  (unk) Access to Means:  (unk) What has been your use of drugs/alcohol within the last 12 months?: unk Previous Attempts/Gestures:  (unk) How many times?:  (unk) Other Self Harm Risks:  (unk) Triggers for Past Attempts:  Unknown Intentional Self Injurious Behavior:  (unk) Family Suicide History: Unknown Recent stressful life event(s): Other (Comment) (unk) Persecutory voices/beliefs?:  (unk) Depression:  (unk) Depression Symptoms:  (unk) Substance abuse history and/or treatment for substance abuse?:  (unk) Suicide prevention information given to non-admitted patients:  (unk)  Risk to Others within the past 6 months Homicidal Ideation:  (unknown ) Does patient have any lifetime risk of violence toward others beyond the six months prior to admission? : Unknown Thoughts of Harm to Others:  (unk) Current Homicidal Intent:  (unk) Current Homicidal Plan:  (unk) Access to Homicidal Means:  (unk) Identified Victim:  (unk) History of harm to others?:  (unk) Assessment of Violence:  (unk) Violent Behavior Description:  (patient is calm; appears to be in a catatonic state) Does patient have access to weapons?:  (unk) Criminal Charges Pending?:  (unk) Does patient have a court date: No Is patient on probation?: No  Psychosis Hallucinations:  (unknown ) Delusions: Unspecified (Unknown )  Mental Status Report Appearance/Hygiene: In scrubs Eye Contact: Poor Motor Activity: Unable to assess Speech: Other (Comment) (Elective mutism) Level of Consciousness: Unresponsive (To pain or command) Mood: Other (Comment) (UTA) Affect: Flat Anxiety Level:  (UTA) Thought Processes: Unable to Assess Judgement: Unable to Assess Orientation: Unable to assess Obsessive Compulsive Thoughts/Behaviors: Unable to Assess  Cognitive Functioning Concentration: Unable to Assess Memory: Unable to Assess IQ:  (UTA) Insight: Unable to Assess Impulse Control: Unable to Assess Appetite:  (UTA) Weight Loss:  (UTa) Weight Gain:  (UTA) Sleep: Unable to Assess Vegetative Symptoms: Unable to Assess  ADLScreening [facility]) Patient's cognitive ability adequate to safely complete daily activities?: No Patient  able to express need for assistance with ADLs?: No Independently performs ADLs?: Yes (appropriate for developmental age)  Prior Inpatient Therapy Prior Inpatient Therapy:  (UTA) Prior Therapy Dates: UTA Prior Therapy Facilty/Provider(s): UTA Reason for Treatment: UTA  Prior Outpatient Therapy Prior Outpatient Therapy:  (unk) Prior Therapy Dates: UTA Prior Therapy Facilty/Provider(s): UTA Reason for Treatment: UTA Does patient have an ACCT team?: Unknown Does patient have Intensive In-House Services?  : Unknown Does patient have [facility] services? : Unknown Does patient have P4CC services?: Unknown  ADL Screening (condition at time of admission) Patient's cognitive ability adequate to safely complete daily activities?: No Is the patient deaf or have difficulty hearing?: No Does the patient have difficulty seeing, even when wearing glasses/contacts?: No Does the patient have difficulty concentrating, remembering, or making decisions?: No (patient in a catatonic state ) Patient able to express need for assistance with ADLs?: No Does the patient have difficulty dressing or bathing?: No Independently performs ADLs?: Yes (appropriate for developmental age) Does the patient have difficulty walking or climbing stairs?:  (unk) Weakness of Legs:  (unk) Weakness of Arms/Hands:  (unk)  Home Assistive Devices/Equipment Home Assistive Devices/Equipment:  (unk)    Abuse/Neglect Assessment (Assessment to be complete while patient is alone) Physical Abuse:  (un) Verbal Abuse:  (unk) Sexual Abuse:  (unk) Exploitation of patient/patient's resources:  (unk) Self-Neglect:  (unk) Values / Beliefs Cultural  Requests During Hospitalization:  (unk) Spiritual Requests During Hospitalization:  (unk )   Advance Directives (For Healthcare) Does Patient Have a Medical Advance Directive?: No (UTA) Would patient like information on creating a medical advance directive?:  (UTA) Nutrition Screen- [facility]  Adult/[facility] Patient's home diet:  (UTA; patient is in a catatonic state during the time of th the TTS assessment )  Additional Information 1:1 In Past 12 Months?: No CIRT Risk: No Elopement Risk: No Does patient have medical clearance?: Yes     Disposition: .  Patient will benefit from crisis stabilization, medication evaluation, group therapy and psychoeducation, in addition to case management for discharge planning. Nanine MeansJamison Lord, DNP, recommends INPT treatment. TTS to seek placement, preferably [facility] for ECT treatment.   Disposition Initial Assessment Completed for this Encounter: Yes Disposition of Patient: Inpatient treatment program Type of inpatient treatment program: Adult (Pt. meets criteria for INPT treatment, per Franne FortsJamison Lord,DNP)  On Site Evaluation by:   Reviewed with Physician:    Melynda Rippleoyka Saphyra Hutt 07/14/2016 2:16 PM

## 2016-07-14 NOTE — ED Notes (Signed)
Pt remains in catatonic state.  No movement except eye movements.

## 2016-07-14 NOTE — ED Notes (Signed)
Patient does not respond to any pain or voice. Patient has eyes open occasionally blinks. Resp rate normal. Heart rate normal.

## 2016-07-14 NOTE — ED Notes (Signed)
[facility]. Pt will still be going to room 304

## 2016-07-14 NOTE — ED Notes (Signed)
Report given to TCU Rn.

## 2016-07-14 NOTE — ED Notes (Signed)
Patient offered food but he would not respond to open his mouth.

## 2016-07-14 NOTE — ED Notes (Signed)
Attempted to calll report to [phone], spoke with Charge RN.  She informed this RN will need to call back in 30-35 minutes to give report and stated "hold on and I'll transfer you."  The call was sent to ICU.

## 2016-07-14 NOTE — Progress Notes (Signed)
[facility]  Patient assigned to room 304 Accepting physician is Dr. Toni Amendlapacs.  Attending is Dr. Toni Amendlapacs Call report to [phone].  Representative was Colgate-PalmoliveJanet.  Christoffer Currier K. Sherlon HandingHarris, LCAS-A, LPC-A, [facility]  Counselor 07/14/2016 6:56 PM

## 2016-07-14 NOTE — ED Notes (Signed)
Called receiving RN @ [facility], Baird LyonsCasey, and informed of change in right pupil when RN entered room to do VS but was back to normal prior to EDP entering room to evaluate.

## 2016-07-14 NOTE — ED Provider Notes (Signed)
[facility]-EMERGENCY DEPT Provider Note   CSN: [id number] Arrival date & time: 07/14/16  1020     History   Chief Complaint No chief complaint on file.   HPI Benjamin [name] is a 35 y.o. male.  35 year old male who has extensive psychiatric history including catatonia who presents in a catatonic state from home. Family had called EMS due to not hearing from the patient in a few days. Patient would not medicate with me at this time and history is per the old records as well as EMS. He was just discharged from the hospital for similar symptoms 2 days ago. I spoke with the psychiatrist who saw the patient at that time he states that he is concerned that there may be secondary gain due to the patient's history of sudden reversal of his catatonia during his last stay here. At time of discharged 3 days ago, patient was at his baseline. No further history obtainable      Past Medical History:  Diagnosis Date  . Depression   . Herniated disc    x3  . Hypertension   . Mental disorder     Patient Active Problem List   Diagnosis Date Noted  . 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 Major depressive disorder, recurrent episode, severe with catatonia (HCC) 07/09/2016  . Malingering 09/29/2012  . 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 Bradycardia 09/18/2012  . Decreased oral intake 09/17/2012  . Psychosis 09/09/2012  . Severe episode of recurrent major depressive disorder, with psychotic features (HCC) 09/08/2012  . History of substance abuse 09/08/2012    Past Surgical History:  Procedure Laterality Date  . SHOULDER SURGERY         Home Medications    Prior to Admission medications   Medication Sig Start Date End Date Taking? Authorizing Provider  benztropine (COGENTIN) 1 MG tablet Take 1 tablet (1 mg total) by mouth at bedtime. For possible side effects. 09/29/12   Tamala JulianNeil T Mashburn, PA-C  citalopram (CELEXA) 10 MG tablet Take 1 tablet (10 mg total) by mouth daily. 07/11/16   Kristeen MansFran E Hobson,  NP  OLANZapine (ZYPREXA) 10 MG tablet Take 1 tablet (10 mg total) by mouth at bedtime. For psychosis and depression. 09/29/12   Tamala JulianNeil T Mashburn, PA-C    Family History History reviewed. No pertinent family history.  Social History Social History  Substance Use Topics  . Smoking status: Former Smoker    Packs/day: 1.00    Types: Cigarettes    Quit date: 04/04/2012  . Smokeless tobacco: Never Used  . Alcohol use No     Allergies   Patient has no known allergies.   Review of Systems Review of Systems  Unable to perform ROS: Mental status change     Physical Exam Updated Vital Signs BP 142/99   Pulse 74   Temp 98.3 F (36.8 C)   Resp (!) 8   SpO2 97%   Physical Exam  Constitutional: He appears well-developed and well-nourished.  Non-toxic appearance. No distress.  HENT:  Head: Normocephalic and atraumatic.  Eyes: Conjunctivae, EOM and lids are normal. Pupils are equal, round, and reactive to light.  Neck: Normal range of motion. Neck supple. No tracheal deviation present. No thyroid mass present.  Cardiovascular: Normal rate, regular rhythm and normal heart sounds.  Exam reveals no gallop.   No murmur heard. Pulmonary/Chest: Effort normal and breath sounds normal. No stridor. No respiratory distress. He has no decreased breath sounds. He has no wheezes. He has no rhonchi. He  has no rales.  Abdominal: Soft. Normal appearance and bowel sounds are normal. He exhibits no distension. There is no tenderness. There is no rebound and no CVA tenderness.  Musculoskeletal: Normal range of motion. He exhibits no edema or tenderness.  Neurological: No cranial nerve deficit or sensory deficit. GCS eye subscore is 4. GCS verbal subscore is 1. GCS motor subscore is 5.  Patient not cooperative with exam. He is lying in the bed with his eyes open and not tracking at this time.  Skin: Skin is warm and dry. No abrasion and no rash noted.  Psychiatric: He is attentive.  Nursing note and  vitals reviewed.    ED Treatments / Results  Labs (all labs ordered are listed, but only abnormal results are displayed) Labs Reviewed  ETHANOL  RAPID URINE DRUG SCREEN, HOSP PERFORMED  CBC WITH DIFFERENTIAL/PLATELET  COMPREHENSIVE METABOLIC PANEL    EKG  EKG Interpretation  Date/Time:  Monday July 14 2016 10:53:39 EST Ventricular Rate:  77 PR Interval:    QRS Duration: 93 QT Interval:  360 QTC Calculation: 408 R Axis:   35 Text Interpretation:  Sinus rhythm Left atrial enlargement Anteroseptal infarct, age indeterminate Confirmed by Monigue Spraggins  MD, Flordia Kassem ([id number]) on 07/14/2016 11:27:24 AM       Radiology No results found.  Procedures Procedures (including critical care time)  Medications Ordered in ED Medications - No data to display   Initial Impression / Assessment and Plan / ED Course  I have reviewed the triage vital signs and the nursing notes.  Pertinent labs & imaging results that were available during my care of the patient were reviewed by me and considered in my medical decision making (see chart for details).  Clinical Course     Spoke with dr Jannifer Franklinakintayo and [facility]  [facility]  Final Clinical Impressions(s) / ED Diagnoses   Final diagnoses:  None    New Prescriptions New Prescriptions   No medications on file     Lorre NickAnthony Decklan Mau, MD 07/14/16 1245

## 2016-07-14 NOTE — ED Notes (Signed)
Bed: WA28 Expected date:  Expected time:  Means of arrival:  Comments: RM 9 

## 2016-07-14 NOTE — ED Provider Notes (Signed)
Patient has been accepted to . Accepting physician is Dr. Joan Floreslapacs   Iam Lipson, MD 07/14/16 [phone]

## 2016-07-14 NOTE — ED Notes (Addendum)
Pt's right eye noted to be dilated & witnessed by [facility] attendants.  EDP informed & in to room. Pt's right eye to normal upon EDP arrival.  No new orders received.

## 2016-07-15 DIAGNOSIS — F429 Obsessive-compulsive disorder, unspecified: Secondary | ICD-10-CM | POA: Diagnosis present

## 2016-07-15 DIAGNOSIS — F4310 Post-traumatic stress disorder, unspecified: Secondary | ICD-10-CM | POA: Diagnosis present

## 2016-07-15 LAB — LIPID PANEL
CHOLESTEROL: 187 mg/dL (ref 0–200)
HDL: 30 mg/dL — ABNORMAL LOW (ref >40)
LDL Cholesterol: 140 mg/dL — ABNORMAL HIGH (ref 0–99)
TRIGLYCERIDES: 86 mg/dL (ref <150)
Total CHOL/HDL Ratio: 6.2 RATIO
VLDL: 17 mg/dL (ref 0–40)

## 2016-07-15 LAB — TSH: TSH: 0.431 u[IU]/mL (ref 0.350–4.500)

## 2016-07-15 MED ORDER — FLUVOXAMINE MALEATE 50 MG PO TABS
50.0000 mg | ORAL_TABLET | Freq: Every day | ORAL | Status: DC
  Administered 2016-07-15: 50 mg via ORAL
  Filled 2016-07-15: qty 1

## 2016-07-15 MED ORDER — TEMAZEPAM 15 MG PO CAPS
15.0000 mg | ORAL_CAPSULE | Freq: Every day | ORAL | Status: DC
  Administered 2016-07-16 – 2016-07-17 (×2): 15 mg via ORAL
  Filled 2016-07-15 (×3): qty 1

## 2016-07-15 MED ORDER — ARIPIPRAZOLE 10 MG PO TABS
5.0000 mg | ORAL_TABLET | Freq: Every day | ORAL | Status: DC
Start: 2016-07-15 — End: 2016-07-18
  Administered 2016-07-15 – 2016-07-18 (×3): 5 mg via ORAL
  Filled 2016-07-15 (×4): qty 1

## 2016-07-15 MED ORDER — ENSURE ENLIVE PO LIQD
237.0000 mL | Freq: Two times a day (BID) | ORAL | Status: DC
  Administered 2016-07-16 – 2016-07-17 (×3): 237 mL via ORAL

## 2016-07-15 NOTE — Progress Notes (Signed)
D:Patient stated he has  A  Court case on Friday  Patient has been  In bed most of shift . Appetite good at meals . No problem  With sleep  Able to attend to ADL's and personal Chores . Interacting with peers and staff.  No unit programing  This shift . Gait normal  Voice  Of no concerns . Compliant with medication  Patient stated slept good last night .Stated appetite is good and energy level  Is normal. Stated concentration is good .  A: Encourage patient participation with unit programming . Instruction  Given on  Medication , verbalize understanding. R: Voice no other concerns. Staff continue to monitor

## 2016-07-15 NOTE — Progress Notes (Signed)
Patient's brother is his legal guardian. His name is Jacqualine Maundy Chaikin and his number is [phone]

## 2016-07-15 NOTE — Progress Notes (Signed)
Patient able to make conversation. Asking questions about medication. Patient urinated 700 ml in urinal with assistance from tech.

## 2016-07-15 NOTE — [facility] Suicide Risk Assessment (Signed)
[facility]   Nursing information obtained from:    Demographic factors:    Current Mental Status:    Loss Factors:    Historical Factors:    Risk Reduction Factors:     Total Time spent with patient: 1 hour Principal Problem: Major depressive disorder, recurrent episode, severe with catatonia (HCC) Diagnosis:   Patient Active Problem List   Diagnosis Date Noted  . PTSD (post-traumatic stress disorder) [F43.10] 07/15/2016  . OCD (obsessive compulsive disorder) [F42.9] 07/15/2016  . 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 Malingering [Z76.5] 09/29/2012  . 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 Bradycardia [R00.1] 09/18/2012  . Decreased oral intake [R63.8] 09/17/2012  . Psychosis [F29] 09/09/2012  . Severe episode of recurrent major depressive disorder, with psychotic features (HCC) [F33.3] 09/08/2012  . History of substance abuse [Z87.898] 09/08/2012   Subjective Data: catatonia.  Continued Clinical Symptoms:  Alcohol Use Disorder Identification Test Final Score (AUDIT): 0 The "Alcohol Use Disorders Identification Test", Guidelines for Use in Primary Care, Second Edition.  World Science writerHealth Organization [facility](WHO). Score between 0-7:  no or low risk or alcohol related problems. Score between 8-15:  moderate risk of alcohol related problems. Score between 16-19:  high risk of alcohol related problems. Score 20 or above:  warrants further diagnostic evaluation for alcohol dependence and treatment.   CLINICAL FACTORS:   Severe Anxiety and/or Agitation Depression:   Impulsivity Obsessive-Compulsive Disorder   Musculoskeletal: Strength & Muscle Tone: within normal limits Gait & Station: normal Patient leans: N/A  Psychiatric Specialty Exam: Physical Exam  Nursing note and vitals reviewed.   Review of Systems  Psychiatric/Behavioral: Positive for  depression. The patient is nervous/anxious.   All other systems reviewed and are negative.   Blood pressure 115/81, pulse 88, temperature 98 F (36.7 C), temperature source Oral, resp. rate 18, SpO2 96 %.There is no height or weight on file to calculate BMI.  General Appearance: Casual  Eye Contact:  Good  Speech:  Clear and Coherent  Volume:  Normal  Mood:  Anxious and Depressed  Affect:  Blunt  Thought Process:  Goal Directed and Descriptions of Associations: Tangential  Orientation:  Full (Time, Place, and Person)  Thought Content:  WDL  Suicidal Thoughts:  No  Homicidal Thoughts:  No  Memory:  Immediate;   Fair Recent;   Fair Remote;   Fair  Judgement:  Fair  Insight:  Lacking  Psychomotor Activity:  Psychomotor Retardation  Concentration:  Concentration: Fair and Attention Span: Fair  Recall:  [employer]Fair  Fund of Knowledge:  Fair  Language:  Fair  Akathisia:  No  Handed:  Right  AIMS (if indicated):     Assets:  Communication Skills Desire for Improvement Housing Physical Health Resilience Social Support Transportation  ADL's:  Intact  Cognition:  WNL  Sleep:  Number of Hours: 2      COGNITIVE FEATURES THAT CONTRIBUTE TO RISK:  None    SUICIDE RISK:   Moderate:  Frequent suicidal ideation with limited intensity, and duration, some specificity in terms of plans, no associated intent, good self-control, limited dysphoria/symptomatology, some risk factors present, and identifiable protective factors, including available and accessible social support.   PLAN OF CARE: Hospital admission, medication management, discharge planning.  Ms. Benjamin [name] is a 35 year old male with a history of depression admitted in catatonia.   1. Cardiac catatonia. The patient was brought to the catatonic last  night. He was started on Ativan 2 mg every 2 hours and had sitter. Resolved by This Morning.   2. Mood. The patient reports many symptoms of depression and anxiety with PTSD and severe  OCD symptoms. We will discontinue 10 mg Celexa and start Luvox tonight for depression and anxiety.   3. ECT. We will request ECT consult from Dr. Tona Sensinglapacks.  4. Insomnia. We will offer Restoril.  5. Disposition. He will be discharged to home. [place]  I certify that inpatient services furnished can reasonably be expected to improve the patient's condition.  Benjamin LineaJolanta Janecia Palau, MD 07/15/2016, 9:44 AM

## 2016-07-15 NOTE — Progress Notes (Signed)
Patient appears more coherent. Able to answer questions with shaking of the head. Patient was able to get out the word "sleep" when struggling to get out a question. 2nd dose of 2mg  Ativan administered.

## 2016-07-15 NOTE — Tx Team (Signed)
Interdisciplinary Treatment and Diagnostic Plan Update  07/15/2016 Time of Session: 10:30am Benjamin HensenJoshua [name] MRN: [id number]  Principal Diagnosis: Major depressive disorder, recurrent episode, severe with catatonia (HCC)  Secondary Diagnoses: Principal Problem:   Major depressive disorder, recurrent episode, severe with catatonia (HCC) Active Problems:   PTSD (post-traumatic stress disorder)   OCD (obsessive compulsive disorder)   Current Medications:  Current Facility-Administered Medications  Medication Dose Route Frequency Provider Last Rate Last Dose  . acetaminophen (TYLENOL) tablet 650 mg  650 mg Oral Q6H PRN Shari ProwsJolanta B Pucilowska, MD   650 mg at 07/15/16 0402  . alum & mag hydroxide-simeth (MAALOX/MYLANTA) 200-200-20 MG/5ML suspension 30 mL  30 mL Oral Q4H PRN Jolanta B Pucilowska, MD      . feeding supplement (ENSURE ENLIVE) (ENSURE ENLIVE) liquid 237 mL  237 mL Oral BID BM John T Clapacs, MD      . fluvoxaMINE (LUVOX) tablet 50 mg  50 mg Oral QHS Jolanta B Pucilowska, MD      . LORazepam (ATIVAN) tablet 2 mg  2 mg Oral Q2H Jolanta B Pucilowska, MD   2 mg at 07/15/16 1727   Or  . LORazepam (ATIVAN) injection 2 mg  2 mg Intramuscular Q2H Jolanta B Pucilowska, MD   2 mg at 07/15/16 0152  . magnesium hydroxide (MILK OF MAGNESIA) suspension 30 mL  30 mL Oral Daily PRN Jolanta B Pucilowska, MD      . temazepam (RESTORIL) capsule 15 mg  15 mg Oral QHS Jolanta B Pucilowska, MD       PTA Medications: Prescriptions Prior to Admission  Medication Sig Dispense Refill Last Dose  . benztropine (COGENTIN) 1 MG tablet Take 1 tablet (1 mg total) by mouth at bedtime. For possible side effects. 30 tablet 0   . citalopram (CELEXA) 10 MG tablet Take 1 tablet (10 mg total) by mouth daily. 30 tablet 0 unknown  . OLANZapine (ZYPREXA) 10 MG tablet Take 1 tablet (10 mg total) by mouth at bedtime. For psychosis and depression. 30 tablet 0     Patient Stressors: Marital or family conflict  Patient  Strengths: Ability for insight Motivation for treatment/growth  Treatment Modalities: Medication Management, Group therapy, Case management,  1 to 1 session with clinician, Psychoeducation, Recreational therapy.   Physician Treatment Plan for Primary Diagnosis: Major depressive disorder, recurrent episode, severe with catatonia (HCC) Long Term Goal(s): Improvement in symptoms so as ready for discharge NA   Short Term Goals: Ability to identify changes in lifestyle to reduce recurrence of condition will improve Ability to verbalize feelings will improve Ability to disclose and discuss suicidal ideas Ability to demonstrate self-control will improve Ability to identify and develop effective coping behaviors will improve Ability to maintain clinical measurements within normal limits will improve Compliance with prescribed medications will improve NA  Medication Management: Evaluate patient's response, side effects, and tolerance of medication regimen.  Therapeutic Interventions: 1 to 1 sessions, Unit Group sessions and Medication administration.  Evaluation of Outcomes: Progressing  Physician Treatment Plan for Secondary Diagnosis: Principal Problem:   Major depressive disorder, recurrent episode, severe with catatonia (HCC) Active Problems:   PTSD (post-traumatic stress disorder)   OCD (obsessive compulsive disorder)  Long Term Goal(s): Improvement in symptoms so as ready for discharge NA   Short Term Goals: Ability to identify changes in lifestyle to reduce recurrence of condition will improve Ability to verbalize feelings will improve Ability to disclose and discuss suicidal ideas Ability to demonstrate self-control will improve Ability to identify and develop  effective coping behaviors will improve Ability to maintain clinical measurements within normal limits will improve Compliance with prescribed medications will improve NA     Medication Management: Evaluate patient's  response, side effects, and tolerance of medication regimen.  Therapeutic Interventions: 1 to 1 sessions, Unit Group sessions and Medication administration.  Evaluation of Outcomes: Progressing   RN Treatment Plan for Primary Diagnosis: Major depressive disorder, recurrent episode, severe with catatonia (HCC) Long Term Goal(s): Knowledge of disease and therapeutic regimen to maintain health will improve  Short Term Goals: Ability to remain free from injury will improve, Ability to verbalize frustration and anger appropriately will improve, Ability to participate in decision making will improve and Ability to identify and develop effective coping behaviors will improve  Medication Management: RN will administer medications as ordered by provider, will assess and evaluate patient's response and provide education to patient for prescribed medication. RN will report any adverse and/or side effects to prescribing provider.  Therapeutic Interventions: 1 on 1 counseling sessions, Psychoeducation, Medication administration, Evaluate responses to treatment, Monitor vital signs and CBGs as ordered, Perform/monitor CIWA, COWS, AIMS and Fall Risk screenings as ordered, Perform wound care treatments as ordered.  Evaluation of Outcomes: Progressing   LCSW Treatment Plan for Primary Diagnosis: Major depressive disorder, recurrent episode, severe with catatonia (HCC) Long Term Goal(s): Safe transition to appropriate next level of care at discharge, Engage patient in therapeutic group addressing interpersonal concerns.  Short Term Goals: Engage patient in aftercare planning with referrals and resources, Increase social support, Facilitate acceptance of mental health diagnosis and concerns and Increase skills for wellness and recovery  Therapeutic Interventions: Assess for all discharge needs, 1 to 1 time with Social worker, Explore available resources and support systems, Assess for adequacy in community  support network, Educate family and significant other(s) on suicide prevention, Complete Psychosocial Assessment, Interpersonal group therapy.  Evaluation of Outcomes: Progressing   Progress in Treatment: Attending groups: Yes. Participating in groups: Yes. Taking medication as prescribed: Yes. Toleration medication: Yes. Family/Significant other contact made: No, will contact:    Patient understands diagnosis: Yes. Discussing patient identified problems/goals with staff: Yes. Medical problems stabilized or resolved: Yes. Denies suicidal/homicidal ideation: Yes. Issues/concerns per patient self-inventory: Yes. Other:    New problem(s) identified: No, Describe:     New Short Term/Long Term Goal(s):  Discharge Plan or Barriers: Home with outpatient provider TBD  Reason for Continuation of Hospitalization: Anxiety Medication stabilization Other; describe catatonia  Estimated Length of Stay:3-5 days  Attendees: Patient: 07/15/2016 5:47 PM  Physician:  07/15/2016 5:47 PM  Nursing:  07/15/2016 5:47 PM  RN Care Manager: 07/15/2016 5:47 PM  Social Worker:  07/15/2016 5:47 PM  Recreational Therapist:  07/15/2016 5:47 PM  Other:  07/15/2016 5:47 PM  Other:  07/15/2016 5:47 PM  Other: 07/15/2016 5:47 PM    Scribe for Treatment Team: Glennon MacSara P Tannie Koskela, LCSW 07/15/2016 5:47 PM

## 2016-07-15 NOTE — Consult Note (Signed)
[facility]   Reason for Consult:  This is an ECT consult at this point for this 35 year old man who was transferred to us from [facility] for recurrent spells of catatonia as part of a depression Referring Physician:  Pucilowska Patient Identification: Benjamin HensenJoshua [name] MRN:  [id number] Principal Diagnosis: Major depressive disorder, recurrent episode, severe with catatonia [facility](HCC) Diagnosis:   Patient Active Problem List   Diagnosis Date Noted  . PTSD (post-traumatic stress disorder) [F43.10] 07/15/2016  . OCD (obsessive compulsive disorder) [F42.9] 07/15/2016  . 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 Malingering [Z76.5] 09/29/2012  . 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 Bradycardia [R00.1] 09/18/2012  . Decreased oral intake [R63.8] 09/17/2012  . Psychosis [F29] 09/09/2012  . Severe episode of recurrent major depressive disorder, with psychotic features (HCC) [F33.3] 09/08/2012  . History of substance abuse [Z87.898] 09/08/2012    Total Time spent with patient: 1 hour  Subjective:   Benjamin HensenJoshua Bramer is a 35 y.o. male patient admitted with "I really don't know".  HPI:  Patient interviewed. Chart reviewed. Case reviewed with treatment team. This is a 35 year old man transferred last night from [facility]. We had first heard of him last week as a possible ECT transfer. Over the weekend it sounds like he got better and was actually allowed to go home but then almost immediately return to the emergency room. Information gathered from the patient although the patient seems to be a somewhat unreliable historian. Chart has been reviewed although there seemed to be some gaps in the information. I tried to reach his family but was unable to get anyone on the phone. Evidently the patient has been suffering from symptoms of behavior change possible  depression but it been going on for some unclear length of time. Family sounds like they got concerned at some point and had him brought to the emergency room at which point he was found to be in a catatonic condition. This was about a week ago. He gradually recovered from that and was allowed to go home and it sounds like he relapsed back into catatonia although he doesn't really have any memory of it. On interview today the patient denies feeling depressed. Denies having hallucinations. He does say that he feels paranoid and refuses to discuss anything more about that. Denies suicidal or homicidal ideation. Patient is not able to give much information about his psychiatric history saying that he was not discharged on any medication or with any follow-up.  Social history: This is especially confusing to me. Apparently there are 2 young children involved and the patient was to some degree the primary caretaker for them. I am unclear about what the status of the children's mother is. The patient talks about there being a 50 B order out but it's not clear to me who exactly it is again store on what grounds. The [facility] states that they called for a child protective services evaluation about the children. It seems to be suggested that the children are now with their grandparents although I was not able to confirm that. Patient said he had lost a job recently. Was very vague about it.  Medical history: Evidently has no medical history of any significance outside of the psychiatric.  Substance abuse history: Patient admits to some use at times of marijuana. Denies other drug abuse. Denies heavy alcohol abuse. Not clear how  much substance abuse has been a problem in the past either.  Past Psychiatric History: Patient has had several presentations to emergency rooms and for treatment in the past. He says he remembers at least 3 of these episodes of catatonia. The description of him when he is  in a catatonic condition includes nearly unresponsiveness, some indication of hyper religious delusions or hallucinations. Patient doesn't have any insight into it now. It looks like they were giving him Zyprexa at [facility] but it's not clear to me that he ever took any medicine long-term outside the hospital. I am confused as to what the underlying diagnosis is whether it is major depression, bipolar disorder, schizophrenia schizoaffective disorder or what. Not clear to me if he is ever tried to hurt himself.  Risk to Self: Is patient at risk for suicide?: No Risk to Others:   Prior Inpatient Therapy:   Prior Outpatient Therapy:    Past Medical History:  Past Medical History:  Diagnosis Date  . Depression   . Herniated disc    x3  . Hypertension   . Mental disorder     Past Surgical History:  Procedure Laterality Date  . SHOULDER SURGERY     Family History: History reviewed. No pertinent family history. Family Psychiatric  History: Patient does state that his father committed suicide. He does not know however what kind of mental health problems his father had. Social History:  History  Alcohol Use No     History  Drug Use No    Social History   Social History  . Marital status: Single    Spouse name: N/A  . Number of children: N/A  . Years of education: N/A   Social History Main Topics  . Smoking status: Former Smoker    Packs/day: 1.00    Types: Cigarettes    Quit date: 04/04/2012  . Smokeless tobacco: Never Used  . Alcohol use No  . Drug use: No  . Sexual activity: No   Other Topics Concern  . None   Social History Narrative  . None   Additional Social History:    Allergies:  No Known Allergies  Labs:  Results for orders placed or performed during the hospital encounter of 07/14/16 (from the past 48 hour(s))  Lipid panel     Status: Abnormal   Collection Time: 07/15/16  7:21 AM  Result Value Ref Range   Cholesterol 187 0 - 200 mg/dL    Triglycerides 86 <161 mg/dL   HDL 30 (L) >09 mg/dL   Total CHOL/HDL Ratio 6.2 RATIO   VLDL 17 0 - 40 mg/dL   LDL Cholesterol 604 (H) 0 - 99 mg/dL    Comment:        Total Cholesterol/HDL:CHD Risk Coronary Heart Disease Risk Table                     Men   Women  1/2 Average Risk   3.4   3.3  Average Risk       5.0   4.4  2 X Average Risk   9.6   7.1  3 X Average Risk  23.4   11.0        Use the calculated Patient Ratio above and the CHD Risk Table to determine the patient's CHD Risk.        ATP III CLASSIFICATION (LDL):  <100     mg/dL   Optimal  540-981  mg/dL   Near or  Above                    Optimal  130-159  mg/dL   Borderline  161-096  mg/dL   High  >045     mg/dL   Very High   TSH     Status: None   Collection Time: 07/15/16  7:21 AM  Result Value Ref Range   TSH 0.431 0.350 - 4.500 uIU/mL    Comment: Performed by a 3rd Generation assay with a functional sensitivity of <=0.01 uIU/mL.    Current Facility-Administered Medications  Medication Dose Route Frequency Provider Last Rate Last Dose  . acetaminophen (TYLENOL) tablet 650 mg  650 mg Oral Q6H PRN Shari Prows, MD   650 mg at 07/15/16 0402  . alum & mag hydroxide-simeth (MAALOX/MYLANTA) 200-200-20 MG/5ML suspension 30 mL  30 mL Oral Q4H PRN Jolanta B Pucilowska, MD      . ARIPiprazole (ABILIFY) tablet 5 mg  5 mg Oral Daily Linden Mikes T Halynn Reitano, MD      . feeding supplement (ENSURE ENLIVE) (ENSURE ENLIVE) liquid 237 mL  237 mL Oral BID BM Shawana Knoch T Reia Viernes, MD      . fluvoxaMINE (LUVOX) tablet 50 mg  50 mg Oral QHS Jolanta B Pucilowska, MD      . LORazepam (ATIVAN) tablet 2 mg  2 mg Oral Q2H Jolanta B Pucilowska, MD   2 mg at 07/15/16 1727   Or  . LORazepam (ATIVAN) injection 2 mg  2 mg Intramuscular Q2H Jolanta B Pucilowska, MD   2 mg at 07/15/16 0152  . magnesium hydroxide (MILK OF MAGNESIA) suspension 30 mL  30 mL Oral Daily PRN Jolanta B Pucilowska, MD      . temazepam (RESTORIL) capsule 15 mg  15 mg Oral QHS  Shari Prows, MD        Musculoskeletal: Strength & Muscle Tone: within normal limits Gait & Station: normal Patient leans: N/A  Psychiatric Specialty Exam: Physical Exam  Nursing note and vitals reviewed. Constitutional: He appears well-developed and well-nourished.  HENT:  Head: Normocephalic and atraumatic.  Eyes: Conjunctivae are normal. Pupils are equal, round, and reactive to light.  Neck: Normal range of motion.  Cardiovascular: Regular rhythm and normal heart sounds.   Respiratory: Effort normal. No respiratory distress.  GI: Soft.  Musculoskeletal: Normal range of motion.  Neurological: He is alert.  Skin: Skin is warm and dry.  Psychiatric: His affect is blunt. His speech is delayed. He is slowed. Thought content is paranoid. He expresses impulsivity. He expresses no homicidal and no suicidal ideation. He exhibits abnormal recent memory.    Review of Systems  Constitutional: Negative.   HENT: Negative.   Eyes: Negative.   Respiratory: Negative.   Cardiovascular: Negative.   Gastrointestinal: Negative.   Musculoskeletal: Negative.   Skin: Negative.   Neurological: Negative.   Psychiatric/Behavioral: Positive for memory loss. Negative for depression, hallucinations, substance abuse and suicidal ideas. The patient is not nervous/anxious and does not have insomnia.     Blood pressure 115/81, pulse 88, temperature 98 F (36.7 C), temperature source Oral, resp. rate 18, SpO2 96 %.There is no height or weight on file to calculate BMI.  General Appearance: Casual  Eye Contact:  Good  Speech:  Slow  Volume:  Decreased  Mood:  Euthymic  Affect:  Constricted  Thought Process:  Disorganized  Orientation:  Full (Time, Place, and Person)  Thought Content:  Illogical and Paranoid Ideation  Suicidal Thoughts:  No  Homicidal Thoughts:  No  Memory:  Immediate;   Fair Recent;   Fair Remote;   Poor  Judgement:  Impaired  Insight:  Shallow  Psychomotor Activity:   Decreased  Concentration:  Concentration: Poor  Recall:  Poor  Fund of Knowledge:  Fair  Language:  Fair  Akathisia:  No  Handed:  Right  AIMS (if indicated):     Assets:  Physical Health  ADL's:  Intact  Cognition:  Impaired,  Mild  Sleep:  Number of Hours: 2     Treatment Plan Summary: Daily contact with patient to assess and evaluate symptoms and progress in treatment, Medication management and Plan This is a confusing patient. The patient himself is not a very clear historian and the record is not completely clear and I have not been able to get much collateral information. Patient appears to present with intermittent bouts of catatonia which respond to benzodiazepines but has not been treated to the point of actual resolution of what his illness is. It's a little unclear to me whether again this is a mood disorder or fundamental psychotic disorder or what. The reason I was involved was for possible ECT. If the patient is not actually in a catatonic state then that is not an indication for ECT. If however he is having a major depression or mood episode with psychosis that could be a possible indication for ECT. Right now it seems like he is probably compliant with medication. I think we need to gather more information and continue observation. Meanwhile I agree with Dr. Demetrius CharityP starting the patient on round the clock Ativan which seems to of putting into his catatonia for now. I am going to start him on Abilify. If he needs any antipsychotic it would be better to get him on one that could be delivered as a long-acting injectable. I am not going to put him on the ECT scheduled for tomorrow but will continue to follow-up with him regularly.  Disposition: Supportive therapy provided about ongoing stressors.  Mordecai RasmussenJohn Sreenidhi Ganson, MD 07/15/2016 6:38 PM
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
## 2016-07-15 NOTE — Plan of Care (Signed)
Problem: Coping: Goal: Ability to cope will improve Outcome: Not Progressing Unable to attend unit  Programing  To work on coping

## 2016-07-15 NOTE — Tx Team (Signed)
Initial Treatment Plan 07/15/2016 7:07 AM Benjamin HensenJoshua Fichter WUJ:[id number]    PATIENT STRESSORS: Marital or family conflict   PATIENT STRENGTHS: Ability for insight Motivation for treatment/growth   PATIENT IDENTIFIED PROBLEMS: Catatonia  "to be a better father."  Anxiety                  DISCHARGE CRITERIA:  Improved stabilization in mood, thinking, and/or behavior  PRELIMINARY DISCHARGE PLAN: Outpatient therapy  PATIENT/FAMILY INVOLVEMENT: This treatment plan has been presented to and reviewed with the patient, Benjamin HensenJoshua [name], and/or family member.  The patient and family have been given the opportunity to ask questions and make suggestions.  Lendell Capriceasey N Asianae Minkler, RN 07/15/2016, 7:07 AM

## 2016-07-15 NOTE — Progress Notes (Signed)
Patient admitted to the floor in fully catatonic state. Brought to unit on stretcher by PTAR. Patient only responding to pain. Elective mutism. MD made aware and House supervisor made aware. Sitter put with patient to help with ADLs. Unable to do admission questions. [facility] No contraband found.

## 2016-07-15 NOTE — Progress Notes (Signed)
Recreation Therapy Notes  Date: 12.12.17 Time: 3:00 pm Location: Craft Room  Group Topic: Self-expression.  Goal Area(s) Addresses:  Patient will draw bottle. Patient will write at least one emotion he/she is feeling.  Behavioral Response: Did not attend  Intervention: Bottled Up  Activity: Patients were instructed to draw a bottle the way they see themselves. Patients were then instructed to write the emotions they were feeling inside the bottle.  Education: LRT educated patients on other forms of self-expression.  Education Outcome: Patient did not attend group.  Clinical Observations/Feedback: Patient did not attend group.  Jacquelynn CreeGreene,Maxx Calaway M, LRT/CTRS 07/15/2016 4:06 PM
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
## 2016-07-15 NOTE — [facility] Group Notes (Signed)
[facility] LCSW Group Therapy   07/15/2016 9:30am   Type of Therapy: Group Therapy   Participation Level: Active   Participation Quality: Attentive, Sharing and Supportive   Affect: Appropriate  Cognitive: Alert and Oriented   Insight: Developing/Improving and Engaged   Engagement in Therapy: Developing/Improving and Engaged   Modes of Intervention: Clarification, Confrontation, Discussion, Education, Exploration,  Limit-setting, Orientation, Problem-solving, Rapport Building, Dance movement psychotherapisteality Testing, Socialization and Support  Summary of Progress/Problems: The topic for group therapy was feelings about diagnosis. Pt actively participated in group discussion on their past and current diagnosis and how they feel towards this. Pt also identified how society and family members judge them, based on their diagnosis as well as stereotypes and stigmas. Pt stated that his diagnosis is major depressive disorder. He identified the feeling of powerless as how he feels about his diagnosis. He stated not being well or not being able to be the protective of his family is caused by his mental illness. He stated that his spirituality is a way that she copes with these negative feelings.     Hampton AbbotKadijah Celicia Minahan, MSW, LCSW-A 07/15/2016, 11:13AM

## 2016-07-15 NOTE — ED Provider Notes (Signed)
Prior to transfer I was asked to see patient by the TCU nurse. She felt like his right pupil was dilated and on re-examination was now normal. No other symptoms. Remains catatonic. He has had this catatonia and similar episodes multiple times. No vomiting. On my exam both pupils are similarly sized reactive. I am not sure what to make of the transient findings and do not think further ED workup is necessary. At this time I don't think CT head would be beneficial.   Pricilla LovelessScott Gissela Bloch, MD 07/15/16 [phone]

## 2016-07-15 NOTE — Progress Notes (Signed)
Patient asking for food. Applesauce and crackers provided. Patient also provided with Gatorade.

## 2016-07-15 NOTE — Progress Notes (Signed)
Recreation Therapy Notes  At approximately 1:05 pm, LRT attempted assessment. Patient was sleeping and did not wake when name was called.  Jacquelynn CreeGreene,Darryl Blumenstein M, LRT/CTRS 07/15/2016 1:34 PM

## 2016-07-16 LAB — COMPREHENSIVE METABOLIC PANEL
ALBUMIN: 4.1 g/dL (ref 3.5–5.0)
ALK PHOS: 37 U/L — AB (ref 38–126)
ALT: 40 U/L (ref 17–63)
ANION GAP: 6 (ref 5–15)
AST: 37 U/L (ref 15–41)
BILIRUBIN TOTAL: 0.3 mg/dL (ref 0.3–1.2)
BUN: 16 mg/dL (ref 6–20)
CALCIUM: 9.3 mg/dL (ref 8.9–10.3)
CO2: 28 mmol/L (ref 22–32)
CREATININE: 0.75 mg/dL (ref 0.61–1.24)
Chloride: 99 mmol/L — ABNORMAL LOW (ref 101–111)
GFR calc Af Amer: >60 mL/min (ref >60)
GFR calc non Af Amer: >60 mL/min (ref >60)
GLUCOSE: 135 mg/dL — AB (ref 65–99)
Potassium: 4 mmol/L (ref 3.5–5.1)
Sodium: 133 mmol/L — ABNORMAL LOW (ref 135–145)
TOTAL PROTEIN: 7.1 g/dL (ref 6.5–8.1)

## 2016-07-16 LAB — CBC WITH DIFFERENTIAL/PLATELET
BASOS ABS: 0 10*3/uL (ref 0–0.1)
BASOS PCT: 0 %
EOS ABS: 0.1 10*3/uL (ref 0–0.7)
EOS PCT: 1 %
HCT: 44.2 % (ref 40.0–52.0)
Hemoglobin: 15.7 g/dL (ref 13.0–18.0)
Lymphocytes Relative: 13 %
Lymphs Abs: 1 10*3/uL (ref 1.0–3.6)
MCH: 30.8 pg (ref 26.0–34.0)
MCHC: 35.6 g/dL (ref 32.0–36.0)
MCV: 86.6 fL (ref 80.0–100.0)
MONO ABS: 0.3 10*3/uL (ref 0.2–1.0)
Monocytes Relative: 4 %
Neutro Abs: 6.2 10*3/uL (ref 1.4–6.5)
Neutrophils Relative %: 82 %
PLATELETS: 223 10*3/uL (ref 150–440)
RBC: 5.1 MIL/uL (ref 4.40–5.90)
RDW: 12.9 % (ref 11.5–14.5)
WBC: 7.5 10*3/uL (ref 3.8–10.6)

## 2016-07-16 LAB — URINALYSIS, COMPLETE (UACMP) WITH MICROSCOPIC
Bilirubin Urine: NEGATIVE
GLUCOSE, UA: NEGATIVE mg/dL
HGB URINE DIPSTICK: NEGATIVE
Ketones, ur: NEGATIVE mg/dL
LEUKOCYTES UA: NEGATIVE
NITRITE: NEGATIVE
Protein, ur: 30 mg/dL — AB
SPECIFIC GRAVITY, URINE: 1.025 (ref 1.005–1.030)
pH: 6 (ref 5.0–8.0)

## 2016-07-16 LAB — HEMOGLOBIN A1C
Hgb A1c MFr Bld: 5.2 % (ref 4.8–5.6)
Mean Plasma Glucose: 103 mg/dL

## 2016-07-16 MED ORDER — FLUVOXAMINE MALEATE 50 MG PO TABS
100.0000 mg | ORAL_TABLET | Freq: Every day | ORAL | Status: DC
  Administered 2016-07-16 – 2016-07-18 (×3): 100 mg via ORAL
  Filled 2016-07-16 (×3): qty 2

## 2016-07-16 NOTE — Progress Notes (Signed)
Pt is able to urinate and provided urine specimen. Will send to lab.

## 2016-07-16 NOTE — Tx Team (Signed)
Interdisciplinary Treatment and Diagnostic Plan Update  07/16/2016 Time of Session: 11:00 AM Benjamin HensenJoshua [name] MRN: [id number]  Principal Diagnosis: Major depressive disorder, recurrent episode, severe with catatonia (HCC)  Secondary Diagnoses: Principal Problem:   Major depressive disorder, recurrent episode, severe with catatonia (HCC) Active Problems:   PTSD (post-traumatic stress disorder)   OCD (obsessive compulsive disorder)   Current Medications:  Current Facility-Administered Medications  Medication Dose Route Frequency Provider Last Rate Last Dose  . acetaminophen (TYLENOL) tablet 650 mg  650 mg Oral Q6H PRN Shari ProwsJolanta B Pucilowska, MD   650 mg at 07/15/16 0402  . alum & mag hydroxide-simeth (MAALOX/MYLANTA) 200-200-20 MG/5ML suspension 30 mL  30 mL Oral Q4H PRN Jolanta B Pucilowska, MD      . ARIPiprazole (ABILIFY) tablet 5 mg  5 mg Oral Daily Audery AmelJohn T Clapacs, MD   Stopped at 07/16/16 0935  . feeding supplement (ENSURE ENLIVE) (ENSURE ENLIVE) liquid 237 mL  237 mL Oral BID BM Audery AmelJohn T Clapacs, MD   237 mL at 07/16/16 1030  . fluvoxaMINE (LUVOX) tablet 50 mg  50 mg Oral QHS Shari ProwsJolanta B Pucilowska, MD   50 mg at 07/15/16 2152  . LORazepam (ATIVAN) tablet 2 mg  2 mg Oral Q2H Shari ProwsJolanta B Pucilowska, MD   Stopped at 07/16/16 1036   Or  . LORazepam (ATIVAN) injection 2 mg  2 mg Intramuscular Q2H Jolanta B Pucilowska, MD   2 mg at 07/15/16 0152  . magnesium hydroxide (MILK OF MAGNESIA) suspension 30 mL  30 mL Oral Daily PRN Jolanta B Pucilowska, MD      . temazepam (RESTORIL) capsule 15 mg  15 mg Oral QHS Jolanta B Pucilowska, MD       PTA Medications: Prescriptions Prior to Admission  Medication Sig Dispense Refill Last Dose  . benztropine (COGENTIN) 1 MG tablet Take 1 tablet (1 mg total) by mouth at bedtime. For possible side effects. 30 tablet 0   . citalopram (CELEXA) 10 MG tablet Take 1 tablet (10 mg total) by mouth daily. 30 tablet 0 unknown  . OLANZapine (ZYPREXA) 10 MG tablet Take 1  tablet (10 mg total) by mouth at bedtime. For psychosis and depression. 30 tablet 0     Patient Stressors: Marital or family conflict  Patient Strengths: Ability for insight Motivation for treatment/growth  Treatment Modalities: Medication Management, Group therapy, Case management,  1 to 1 session with clinician, Psychoeducation, Recreational therapy.   Physician Treatment Plan for Primary Diagnosis: Major depressive disorder, recurrent episode, severe with catatonia (HCC) Long Term Goal(s): Improvement in symptoms so as ready for discharge NA   Short Term Goals: Ability to identify changes in lifestyle to reduce recurrence of condition will improve Ability to verbalize feelings will improve Ability to disclose and discuss suicidal ideas Ability to demonstrate self-control will improve Ability to identify and develop effective coping behaviors will improve Ability to maintain clinical measurements within normal limits will improve Compliance with prescribed medications will improve NA  Medication Management: Evaluate patient's response, side effects, and tolerance of medication regimen.  Therapeutic Interventions: 1 to 1 sessions, Unit Group sessions and Medication administration.  Evaluation of Outcomes: Progressing  Physician Treatment Plan for Secondary Diagnosis: Principal Problem:   Major depressive disorder, recurrent episode, severe with catatonia (HCC) Active Problems:   PTSD (post-traumatic stress disorder)   OCD (obsessive compulsive disorder)  Long Term Goal(s): Improvement in symptoms so as ready for discharge NA   Short Term Goals: Ability to identify changes in lifestyle to reduce recurrence  of condition will improve Ability to verbalize feelings will improve Ability to disclose and discuss suicidal ideas Ability to demonstrate self-control will improve Ability to identify and develop effective coping behaviors will improve Ability to maintain clinical  measurements within normal limits will improve Compliance with prescribed medications will improve NA     Medication Management: Evaluate patient's response, side effects, and tolerance of medication regimen.  Therapeutic Interventions: 1 to 1 sessions, Unit Group sessions and Medication administration.  Evaluation of Outcomes: Progressing   RN Treatment Plan for Primary Diagnosis: Major depressive disorder, recurrent episode, severe with catatonia (HCC) Long Term Goal(s): Knowledge of disease and therapeutic regimen to maintain health will improve  Short Term Goals: Ability to remain free from injury will improve, Ability to verbalize frustration and anger appropriately will improve, Ability to demonstrate self-control, Ability to disclose and discuss suicidal ideas and Ability to identify and develop effective coping behaviors will improve  Medication Management: RN will administer medications as ordered by provider, will assess and evaluate patient's response and provide education to patient for prescribed medication. RN will report any adverse and/or side effects to prescribing provider.  Therapeutic Interventions: 1 on 1 counseling sessions, Psychoeducation, Medication administration, Evaluate responses to treatment, Monitor vital signs and CBGs as ordered, Perform/monitor CIWA, COWS, AIMS and Fall Risk screenings as ordered, Perform wound care treatments as ordered.  Evaluation of Outcomes: Progressing   LCSW Treatment Plan for Primary Diagnosis: Major depressive disorder, recurrent episode, severe with catatonia (HCC) Long Term Goal(s): Safe transition to appropriate next level of care at discharge, Engage patient in therapeutic group addressing interpersonal concerns.  Short Term Goals: Engage patient in aftercare planning with referrals and resources, Increase ability to appropriately verbalize feelings, Increase emotional regulation, Facilitate acceptance of mental health  diagnosis and concerns and Increase skills for wellness and recovery  Therapeutic Interventions: Assess for all discharge needs, 1 to 1 time with Social worker, Explore available resources and support systems, Assess for adequacy in community support network, Educate family and significant other(s) on suicide prevention, Complete Psychosocial Assessment, Interpersonal group therapy.  Evaluation of Outcomes: Progressing   Progress in Treatment: Attending groups: Yes. Participating in groups: Yes. Taking medication as prescribed: Yes. Toleration medication: Yes. Family/Significant other contact made: No, still assessing proper contacts Patient understands diagnosis: Yes. Discussing patient identified problems/goals with staff: Yes. Medical problems stabilized or resolved: Yes. Denies suicidal/homicidal ideation: Yes. Issues/concerns per patient self-inventory: No.  New problem(s) identified: No, Describe:  None identified.  New Short Term/Long Term Goal(s):  Discharge Plan or Barriers:   Reason for Continuation of Hospitalization: Depression Other; describe catatonia behaviors  Estimated Length of Stay:  Attendees: Patient: Benjamin [name]  07/16/2016 2:06 PM  Physician: Dr. Toni Amendlapacs, MD  07/16/2016 2:06 PM  Nursing: Elenore PaddyJennifer Morrow, RN  07/16/2016 2:06 PM  RN Care Manager: 07/16/2016 2:06 PM  Social Worker: Hampton AbbotKadijah Valerie Cones, MSW, LCSW-A 07/16/2016 2:06 PM  Recreational Therapist: Hershal CoriaBeth Greene, LRT, CTRS  07/16/2016 2:06 PM    Scribe for Treatment Team: Lynden OxfordKadijah R Tommi Crepeau, LCSWA 07/16/2016 2:06 PM

## 2016-07-16 NOTE — [facility] Counselor (Signed)
CSW attempted to meet and complete psychosocial assessment with patient. Pt is unresponsive and is in a catatonic state at this time. CSW will attempt to complete assessment at a later time.    Hampton AbbotKadijah Aldin Drees, MSW, LCSW-A 07/16/2016, 12:10PM

## 2016-07-16 NOTE — Progress Notes (Signed)
Per Dr. Toni Amendlapacs continue with medications as ordered. 2mg  Ativan given IM as ordered, see MAR. Safety maintained. Will continue to monitor.

## 2016-07-16 NOTE — [facility] Group Notes (Signed)
[facility] LCSW Group Therapy   07/16/2016  9:30 am   Type of Therapy: Group Therapy   Participation Level: Pt invited but did not attend.  Participation Quality: Pt invited but did not attend.   Hampton AbbotKadijah Everly Rubalcava, MSW, LCSWA 07/16/2016, 1:46PM

## 2016-07-16 NOTE — Progress Notes (Signed)
Recreation Therapy Notes  Date: 12.13.17 Time: 1:00 pm Location: Craft Room  Group Topic: Self-esteem  Goal Area(s) Addresses:  Patient will write at least one positive trait about self. Patient will verbalize benefit of having healthy self-esteem.  Behavioral Response: Did not attend  Intervention: I Am  Activity: Patients were given a worksheet with the letter I on it and were instructed to write as many positive traits inside the letter.  Education: LRT educated patients on ways they can increase their self-esteem.  Education Outcome: Patient did not attend group.  Clinical Observations/Feedback: Patient did not attend group.  Jacquelynn CreeGreene,Maxwell Martorano M, LRT/CTRS 07/16/2016 2:36 PM

## 2016-07-16 NOTE — Progress Notes (Signed)
MD called back and stated to check VS q1hr, give patient gatorade, and do not give morning medications.

## 2016-07-16 NOTE — Progress Notes (Signed)
Pt sitting up in bed, talking with nurse. Fluids provided and encouraged. Pt able to drink without difficulty. Dinner tray kept on unit for pt to eat when he is ready, pt aware. Pt left sitting on side of bed. Reoritned to call light. Verbalizes understanding. Safety maintained with every 15 minute checks. Will continue to monitor.

## 2016-07-16 NOTE — Progress Notes (Signed)
MD notified that patient was sleeping through the night and he stated that Ativan doses could be not given if patient was asleep.

## 2016-07-16 NOTE — Progress Notes (Signed)
On assessment now, pt with eyes open, responsive, but continues to be nonverbal. Directs eyes toward this nurse, moves arms/legs freely. Will continue with ativan IM as ordered this dose. Safety maintained. Will continue to monitor.

## 2016-07-16 NOTE — Progress Notes (Signed)
D: Patient appears sad but brightens up on approach. Patient appears mildly confused. Asks some questions more than once. Denies SI/HI/AVH. VS Stable. Patient ate snack and has been visible in the milieu.  A: Medication given with education. Encouragement provided.  R: Patient was compliant with medication. He has remained calm and cooperative. Safety maintained with 15 min checks.

## 2016-07-16 NOTE — Progress Notes (Signed)
Patient with BP of 81/54 and HR of 43. Vomiting and sweating. MD paged.

## 2016-07-16 NOTE — Progress Notes (Signed)
Pt continues to toss/turn in bed, but will not respond with communication. Pt informed that lunch is available at this time from this nurse and tech, continues to not communicate. MD informed. Dr. Toni Amendlapacs states he will be down shortly to assess patient. Safety maintained. Will continue to monitor.

## 2016-07-16 NOTE — Progress Notes (Signed)
[facility] MD Progress Note  07/16/2016 6:18 PM Benjamin [name]  MRN:  [id number] Subjective:  Patient is not able to offer any history right now. This is a follow-up note for this 35 year old man who is going to be presumed to have major depression severe recurrent with psychotic features currently with catatonia. Early this morning I was called by nursing to report that the patient was complaining of nausea and was vomiting during his vitals checked. They reported that he had a low blood pressure and heart rate at that time and was complaining of pain bladder area. Labs were ordered. His urine analysis does not appear to be infected. There is nothing really remarkable on the rest of his labs. By later in the morning the patient had gone back to his catatonic presentation. When I saw him with the full treatment team this afternoon he was nonresponsive to verbal stimuli or to gentle shaking. Nevertheless he appeared to be physically stable and breathing comfortably. Vital signs the rest of the day of been stable. We had held the Ativan overnight while he was sleeping and also this morning when he was complaining of nausea. I have restarted it this afternoon. Also asked nursing to restart and continue orders for all of his medication as ordered. Principal Problem: Major depressive disorder, recurrent episode, severe with catatonia ([place]) Diagnosis:   Patient Active Problem List   Diagnosis Date Noted  . PTSD (post-traumatic stress disorder) [F43.10] 07/15/2016  . OCD (obsessive compulsive disorder) [F42.9] 07/15/2016  . 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 Major depressive disorder, recurrent episode, severe with catatonia ([place]) [F33.2, F06.1] 07/09/2016  . Malingering [Z76.5] 09/29/2012  . 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 Bradycardia [R00.1] 09/18/2012  . Decreased oral intake [R63.8] 09/17/2012  . Psychosis [F29] 09/09/2012  . Severe episode of recurrent major depressive disorder, with  psychotic features ([place]) [F33.3] 09/08/2012  . History of substance abuse [Z87.898] 09/08/2012   Total Time spent with patient: 30 minutes  Past Psychiatric History: Patient has had recurrent episodes of presentation with catatonia. Underlying condition appears unclear but is probably depression with psychosis  Past Medical History:  Past Medical History:  Diagnosis Date  . Depression   . Herniated disc    x3  . Hypertension   . Mental disorder     Past Surgical History:  Procedure Laterality Date  . SHOULDER SURGERY     Family History: History reviewed. No pertinent family history. Family Psychiatric  History: Unknown Social History:  History  Alcohol Use No     History  Drug Use No    Social History   Social History  . Marital status: Single    Spouse name: N/A  . Number of children: N/A  . Years of education: N/A   Social History Main Topics  . Smoking status: Former Smoker    Packs/day: 1.00    Types: Cigarettes    Quit date: 04/04/2012  . Smokeless tobacco: Never Used  . Alcohol use No  . Drug use: No  . Sexual activity: No   Other Topics Concern  . None   Social History Narrative  . None   Additional Social History:                         Sleep: Negative  Appetite:  Negative  Current Medications: Current Facility-Administered Medications  Medication Dose Route Frequency Provider Last Rate Last Dose  . acetaminophen (TYLENOL) tablet 650 mg  650 mg  Oral Q6H PRN Clovis Fredrickson, MD   650 mg at 07/15/16 0402  . alum & mag hydroxide-simeth (MAALOX/MYLANTA) 200-200-20 MG/5ML suspension 30 mL  30 mL Oral Q4H PRN Jolanta B Pucilowska, MD      . ARIPiprazole (ABILIFY) tablet 5 mg  5 mg Oral Daily Gonzella Lex, MD   Stopped at 07/16/16 0935  . feeding supplement (ENSURE ENLIVE) (ENSURE ENLIVE) liquid 237 mL  237 mL Oral BID BM Gonzella Lex, MD   237 mL at 07/16/16 1030  . fluvoxaMINE (LUVOX) tablet 100 mg  100 mg Oral QHS Gonzella Lex, MD      . LORazepam (ATIVAN) tablet 2 mg  2 mg Oral Q2H Clovis Fredrickson, MD   Stopped at 07/16/16 1036   Or  . LORazepam (ATIVAN) injection 2 mg  2 mg Intramuscular Q2H Jolanta B Pucilowska, MD   2 mg at 07/16/16 1755  . magnesium hydroxide (MILK OF MAGNESIA) suspension 30 mL  30 mL Oral Daily PRN Jolanta B Pucilowska, MD      . temazepam (RESTORIL) capsule 15 mg  15 mg Oral QHS Clovis Fredrickson, MD        Lab Results:  Results for orders placed or performed during the hospital encounter of 07/14/16 (from the past 48 hour(s))  Hemoglobin A1c     Status: None   Collection Time: 07/15/16  7:21 AM  Result Value Ref Range   Hgb A1c MFr Bld 5.2 4.8 - 5.6 %    Comment: (NOTE)         Pre-diabetes: 5.7 - 6.4         Diabetes: >6.4         Glycemic control for adults with diabetes: <7.0    Mean Plasma Glucose 103 mg/dL    Comment: (NOTE) Performed At: [facility] [place], [place] [id number] Lindon Romp MD ZM:[id number]   Lipid panel     Status: Abnormal   Collection Time: 07/15/16  7:21 AM  Result Value Ref Range   Cholesterol 187 0 - 200 mg/dL   Triglycerides 86 <150 mg/dL   HDL 30 (L) >40 mg/dL   Total CHOL/HDL Ratio 6.2 RATIO   VLDL 17 0 - 40 mg/dL   LDL Cholesterol 140 (H) 0 - 99 mg/dL    Comment:        Total Cholesterol/HDL:CHD Risk Coronary Heart Disease Risk Table                     Men   Women  1/2 Average Risk   3.4   3.3  Average Risk       5.0   4.4  2 X Average Risk   9.6   7.1  3 X Average Risk  23.4   11.0        Use the calculated Patient Ratio above and the CHD Risk Table to determine the patient's CHD Risk.        ATP III CLASSIFICATION (LDL):  <100     mg/dL   Optimal  100-129  mg/dL   Near or Above                    Optimal  130-159  mg/dL   Borderline  160-189  mg/dL   High  >190     mg/dL   Very High   TSH     Status: None   Collection Time: 07/15/16  7:21 AM  Result Value Ref Range   TSH 0.431  0.350 - 4.500 uIU/mL    Comment: Performed by a 3rd Generation assay with a functional sensitivity of <=0.01 uIU/mL.  Urinalysis, Complete w Microscopic     Status: Abnormal   Collection Time: 07/16/16  7:45 AM  Result Value Ref Range   Color, Urine AMBER (A) YELLOW    Comment: BIOCHEMICALS MAY BE AFFECTED BY COLOR   APPearance HAZY (A) CLEAR   Specific Gravity, Urine 1.025 1.005 - 1.030   pH 6.0 5.0 - 8.0   Glucose, UA NEGATIVE NEGATIVE mg/dL   Hgb urine dipstick NEGATIVE NEGATIVE   Bilirubin Urine NEGATIVE NEGATIVE   Ketones, ur NEGATIVE NEGATIVE mg/dL   Protein, ur 30 (A) NEGATIVE mg/dL   Nitrite NEGATIVE NEGATIVE   Leukocytes, UA NEGATIVE NEGATIVE   RBC / HPF 0-5 0 - 5 RBC/hpf   WBC, UA 0-5 0 - 5 WBC/hpf   Bacteria, UA RARE (A) NONE SEEN   Squamous Epithelial / LPF 0-5 (A) NONE SEEN   Mucous PRESENT    Hyaline Casts, UA PRESENT   Comprehensive metabolic panel     Status: Abnormal   Collection Time: 07/16/16  9:15 AM  Result Value Ref Range   Sodium 133 (L) 135 - 145 mmol/L   Potassium 4.0 3.5 - 5.1 mmol/L   Chloride 99 (L) 101 - 111 mmol/L   CO2 28 22 - 32 mmol/L   Glucose, Bld 135 (H) 65 - 99 mg/dL   BUN 16 6 - 20 mg/dL   Creatinine, Ser 0.75 0.61 - 1.24 mg/dL   Calcium 9.3 8.9 - 10.3 mg/dL   Total Protein 7.1 6.5 - 8.1 g/dL   Albumin 4.1 3.5 - 5.0 g/dL   AST 37 15 - 41 U/L   ALT 40 17 - 63 U/L   Alkaline Phosphatase 37 (L) 38 - 126 U/L   Total Bilirubin 0.3 0.3 - 1.2 mg/dL   GFR calc non Af Amer >60 >60 mL/min   GFR calc Af Amer >60 >60 mL/min    Comment: (NOTE) The eGFR has been calculated using the CKD EPI equation. This calculation has not been validated in all clinical situations. eGFR's persistently <60 mL/min signify possible Chronic Kidney Disease.    Anion gap 6 5 - 15  CBC with Differential/Platelet     Status: None   Collection Time: 07/16/16  9:15 AM  Result Value Ref Range   WBC 7.5 3.8 - 10.6 K/uL   RBC 5.10 4.40 - 5.90 MIL/uL   Hemoglobin 15.7  13.0 - 18.0 g/dL   HCT 44.2 40.0 - 52.0 %   MCV 86.6 80.0 - 100.0 fL   MCH 30.8 26.0 - 34.0 pg   MCHC 35.6 32.0 - 36.0 g/dL   RDW 12.9 11.5 - 14.5 %   Platelets 223 150 - 440 K/uL   Neutrophils Relative % 82 %   Neutro Abs 6.2 1.4 - 6.5 K/uL   Lymphocytes Relative 13 %   Lymphs Abs 1.0 1.0 - 3.6 K/uL   Monocytes Relative 4 %   Monocytes Absolute 0.3 0.2 - 1.0 K/uL   Eosinophils Relative 1 %   Eosinophils Absolute 0.1 0 - 0.7 K/uL   Basophils Relative 0 %   Basophils Absolute 0.0 0 - 0.1 K/uL    Blood Alcohol level:  Lab Results  Component Value Date   [facility] <5 07/14/2016   ETH <5 27/74/1287    Metabolic Disorder Labs: Lab Results  Component Value Date   HGBA1C 5.2 07/15/2016   MPG 103 07/15/2016   No results found for: PROLACTIN Lab Results  Component Value Date   CHOL 187 07/15/2016   TRIG 86 07/15/2016   HDL 30 (L) 07/15/2016   CHOLHDL 6.2 07/15/2016   VLDL 17 07/15/2016   LDLCALC 140 (H) 07/15/2016    Physical Findings: AIMS:  , ,  ,  ,    CIWA:    COWS:     Musculoskeletal: Strength & Muscle Tone: within normal limits Gait & Station: unable to stand Patient leans: N/A  Psychiatric Specialty Exam: Physical Exam  Nursing note and vitals reviewed. Constitutional: He appears well-developed and well-nourished.  HENT:  Head: Normocephalic and atraumatic.  Eyes: Conjunctivae are normal. Pupils are equal, round, and reactive to light.  Neck: Normal range of motion.  Cardiovascular: Regular rhythm and normal heart sounds.   Respiratory: Effort normal. No respiratory distress.  GI: Soft.  Musculoskeletal: Normal range of motion.  Neurological: He is alert.  Skin: Skin is warm and dry.  Psychiatric: His affect is blunt. His speech is delayed. He is withdrawn.  Patient is catatonic when I saw him this afternoon and is completely unresponsive although physically appears to be stable.    Review of Systems  Unable to perform ROS: Psychiatric disorder     Blood pressure 127/71, pulse 77, temperature 98.7 F (37.1 C), temperature source Axillary, resp. rate 18, SpO2 96 %.There is no height or weight on file to calculate BMI.  General Appearance: Casual  Eye Contact:  None  Speech:  Negative  Volume:  Decreased  Mood:  Negative  Affect:  Negative  Thought Process:  NA  Orientation:  Negative  Thought Content:  Negative  Suicidal Thoughts:  No  Homicidal Thoughts:  No  Memory:  Negative  Judgement:  Negative  Insight:  Negative  Psychomotor Activity:  Negative  Concentration:  Concentration: Negative  Recall:  Negative  Fund of Knowledge:  Negative  Language:  Negative  Akathisia:  Negative  Handed:  Right  AIMS (if indicated):     Assets:  Housing Physical Health  ADL's:  Impaired  Cognition:  Impaired,  Moderate and Severe  Sleep:  Number of Hours: 6.25     Treatment Plan Summary: Daily contact with patient to assess and evaluate symptoms and progress in treatment, Medication management and Plan Patient goes in and out of these catatonic spells. Some of it may be related to whether he is getting the Ativan. I have asked nursing to go ahead and reinstate the 2 mg every 2 hours Ativan which had seemed to be helpful before. We are continuing him on antipsychotics and antidepressant medicine but after his presentation today I am tending to agree that pursuing ECT is the right thing to do. I have spoken with the ECT team and we will tentatively put him on scheduled for Friday. I am hoping that we will be able to come up with a satisfactory understanding of who if anyone can give consent of the patient himself is not able to do so. Meanwhile continue current medicine. His vital signs of been stable the rest of the day and whatever medical issue was presenting this morning seems to of resolved. I spoke with the full treatment team today. We have clarified that there is really no evidence that the patient has a legal guardian. Social work  will try and find any family they can reach.  Alethia Berthold, MD 07/16/2016, 6:18  PM

## 2016-07-16 NOTE — Plan of Care (Signed)
Problem: Coping: Goal: Ability to identify and develop effective coping behavior will improve Outcome: Not Progressing Pt continues to go to/from catatonic state. Was awake, alert, oriented and communicating earlier this morning, but deteriorated by lunch time with no communication with staff.

## 2016-07-16 NOTE — Progress Notes (Signed)
Patient complaining of increase pain in bladder area. States it's a 0/7 and that it aches. MD notified. Urinalysis, EKG, CBC and CMP ordered.

## 2016-07-16 NOTE — Progress Notes (Signed)
On initial assessment this morning, pt was talkative, engaged in conversation about his pain to his lower abd, updated pt as to plan of care to include, encourage fluids, VS every hour, labs to be drawn and urine specimen needed. Pt was walking around in his room, engaging in conversation, provided urine, then went to breakfast.   After breakfast, as already noted, pt lying in bed with fixated stare to the wall. Will not speak to or acknowledge this nurse, just lays there in bed. Pt is noted to turn in bed independently.   Safety maintained with every 15 minute checks. Will continue to monitor.

## 2016-07-16 NOTE — Progress Notes (Signed)
While checking vital signs, pt refused oral temperature check. Pt was approached lying on his stomach, he turned over to his back so that this nurse could check his vital signs, but pt would not speak to this nurse. VS stable. After vitals check, lab tech came into pt's room to draw blood. Pt again would not speak to either the nurse or lab tech. He laid in bed with eyes fixed on the wall. When this nurse and lab tech left the room, pt did turn over to his side and went to sleep. Safety maintained with every 15 minute checks. Will continue to monitor.

## 2016-07-16 NOTE — Progress Notes (Signed)
D: Patient is alert and oriented on the unit this shift. Patient not attended and actively participated in groups today. Patient denies suicidal ideation, homicidal ideation, auditory or visual hallucinations at the present time.  A: Scheduled medications are administered to patient as per MD orders. Emotional support and encouragement are provided. Patient is maintained on q.15 minute safety checks. Patient is informed to notify staff with questions or concerns. R: No adverse medication reactions are noted. Patient is cooperative with medication administration   Patient is receptive, calm and cooperative on the unit at this time. Patient does not  Interact  with others on the unit this shift. Patient contracts for safety at this time. Patient remains safe at this time. Anxiety 10/10 Depression 2/10

## 2016-07-17 DIAGNOSIS — F332 Major depressive disorder, recurrent severe without psychotic features: Principal | ICD-10-CM

## 2016-07-17 DIAGNOSIS — F061 Catatonic disorder due to known physiological condition: Secondary | ICD-10-CM

## 2016-07-17 NOTE — [facility] Counselor (Signed)
Adult Comprehensive Assessment  Patient ID: Benjamin [name], male   DOB: 1981-02-11, 35 y.o.   MRN: [id number]  Information Source: Information source: Patient  Current Stressors:  Educational / Learning stressors: No stressors identified  Employment / Job issues: No stressors identified  Family Relationships: No stressors identified  Surveyor, quantityinancial / Lack of resources (include bankruptcy): No stressors identified  Housing / Lack of housing: No stressors identified  Physical health (include injuries & life threatening diseases): No stressors identified  Social relationships: No stressors identified  Substance abuse: Past history of opiate use and alcohol Bereavement / Loss: No stressors identified   Living/Environment/Situation:  Living Arrangements: Alone Living conditions (as described by patient or guardian): They are good  How long has patient lived in current situation?: A little over 3 years  What is atmosphere in current home: Loving, Supportive  Family History:  Marital status: Single What is your sexual orientation?: Heterosexual  Does patient have children?: Yes How many children?: 2 How is patient's relationship with their children?: 37 year old and 37 year old - good relationship with kids   Childhood History:  By whom was/is the patient raised?: Mother Description of patient's relationship with caregiver when they were a child: It was a good relationship  Patient's description of current relationship with people who raised him/her: Relationship is "okay" How were you disciplined when you got in trouble as a child/adolescent?: Timeout  Does patient have siblings?: Yes Number of Siblings: 1 Description of patient's current relationship with siblings: Has a good relationship with brother  Did patient suffer any verbal/emotional/physical/sexual abuse as a child?: No Did patient suffer from severe childhood neglect?: No Has patient ever been sexually abused/assaulted/raped as  an adolescent or adult?: No Was the patient ever a victim of a crime or a disaster?: No Witnessed domestic violence?: No Has patient been effected by domestic violence as an adult?: No  Education:  Highest grade of school patient has completed: 4 year college  Currently a student?: No Name of school: NA Learning disability?: No  Employment/Work Situation:   Employment situation: Unemployed Patient's job has been impacted by current illness: No What is the longest time patient has a held a job?: 5 years  Where was the patient employed at that time?: [employer] - [employer]  Has patient ever been in the Eli Lilly and Companymilitary?: No Has patient ever served in combat?: No Did You Receive Any Psychiatric Treatment/Services While in Equities traderthe Military?: No Are There Guns or Other Weapons in Your Home?: No Are These ComptrollerWeapons Safely Secured?:  (N/A)  Financial Resources:   Financial resources: No income Does patient have a Lawyerrepresentative payee or guardian?: No  Alcohol/Substance Abuse:   What has been your use of drugs/alcohol within the last 12 months?: None reported - alcohol abuse and opiate in the past  If attempted suicide, did drugs/alcohol play a role in this?: No Alcohol/Substance Abuse Treatment Hx: Past Tx, Inpatient If yes, describe [facility]  Has alcohol/substance abuse ever caused legal problems?: Yes ([place] )  Social Support System:   Patient's Community Support System: Fair Museum/gallery exhibitions officerDescribe Community Support System: Does not have much community support in [place]  Type of faith/religion: Methodist/Christianity  How does patient's faith help to cope with current illness?: Reading, meditation   Leisure/Recreation:   Leisure and Hobbies: Museum/gallery exhibitions officerHiking, outdoor activities, watching movies   Strengths/Needs:   What things does the patient do well?: Good leadership qualities  In what areas does patient struggle / problems for patient: Bringing  out character/ sense of humor    Discharge Plan:   Does patient have access to transportation?: Yes Will patient be returning to same living situation after discharge?: Yes Currently receiving community mental health services: No If no, would patient like referral for services when discharged?: Yes (What county?) ([place] )  Summary/Recommendations:   Summary and Recommendations (to be completed by the evaluator): Patient presented to the hospital under IVC and was admitted for psychotic depression.  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  Pt reports primary triggers for admission were conflict in family dynamics.  Pt reports her stressors are lack of assistance with children and recent loss of employment.  Pt now denies SI/HI/AVH.  [place]  Pt lists supports in the community as his brother and two children.  Patient will benefit from crisis stabilization, medication evaluation, group therapy, and psycho education in addition to case management for discharge planning. Patient and CSW reviewed pt's identified goals and treatment plan. Pt verbalized understanding and agreed to treatment plan.  At discharge it is recommended that patient remain compliant with established plan and continue treatment.  Benjamin [name], MSW, LCSW-A  07/17/2016, 12:31PM

## 2016-07-17 NOTE — Plan of Care (Signed)
Problem: Coping: Goal: Ability to cope will improve Outcome: Progressing Compliant  With unit   Programing  Attending  Unit groups.

## 2016-07-17 NOTE — [facility] Group Notes (Signed)
Goals Group  Date/Time: 07/17/2016 9am  Type of Therapy and Topic: Group Therapy: Goals Group: SMART Goals   Pt was called, but did not attend   Lynette Noah F. Endya Austin, LCSWA, LCAS  

## 2016-07-17 NOTE — Progress Notes (Signed)
  [facility] Discharge [facility] :  Will you be returning to the same living situation after discharge:  Yes,  home with family. At discharge, do you have transportation home?: Yes,  brother will pick pt up. Do you have the ability to pay for your medications: No.  Release of information consent forms completed and in the chart;  Patient's signature needed at discharge.  Patient to Follow up at: Follow-up Information    [facility] Go on 07/21/2016.   Why:  [facility] Monday, 12/18 between 8:30am-2PM. It is important that you bring your discharge paperwork to this appointment. Contact information: Address[address] [place], [place] [address] Phone: [phone] Fax: [phone]          Next level of care provider has [facility] Link:no  Safety Planning and Suicide Prevention discussed: Yes,  completed with brother and patient.  Have you used any form of tobacco in the last 30 days? (Cigarettes, Smokeless Tobacco, Cigars, and/or Pipes): No  Has patient been referred to the Quitline?: N/A patient is not a smoker  Patient has been referred for addiction treatment: N/A  Lynden OxfordKadijah R Karlin Heilman, MSW, LCSW-A 07/17/2016, 4:22 PM

## 2016-07-17 NOTE — [facility] Group Notes (Signed)
[facility] LCSW Group Therapy   07/17/2016 9:30am   Type of Therapy: Group Therapy   Participation Level: Active   Participation Quality: Attentive, Sharing and Supportive   Affect: Appropriate   Cognitive: Alert and Oriented   Insight: Developing/Improving and Engaged   Engagement in Therapy: Developing/Improving and Engaged   Modes of Intervention: Clarification, Confrontation, Discussion, Education, Exploration, Limit-setting, Orientation, Problem-solving, Rapport Building, Dance movement psychotherapisteality Testing, Socialization and Support   Summary of Progress/Problems: The topic for group was balance in life. Today's group focused on defining balance in one's own words, identifying things that can knock one off balance, and exploring healthy ways to maintain balance in life. Group members were asked to provide an example of a time when they felt off balance, describe how they handled that situation, and process healthier ways to regain balance in the future. Group members were asked to share the most important tool for maintaining balance that they [facility] and how they plan to apply this method after discharge. Pt defined balance as being stable. He identified his two out of balance areas as mental/emotional and family. His change plan to achieve balance is to start back working to support family and follow-up with care providers.    Hampton AbbotKadijah Lorin Gawron, MSW, LCSWA 07/17/2016, 10:42AM

## 2016-07-17 NOTE — Progress Notes (Signed)
D: Patient stated slept good last night .Stated appetite is good and energy level  Is normal. Stated concentration is good . Stated on Depression scale 2 , hopeless 0 and anxiety 2 .( low 0-10 high) Denies suicidal  homicidal ideations  .  No auditory hallucinations  No pain concerns . Appropriate ADL'S. Interacting with peers and staff.  Patient very concern about court  And his children  Want to go . Undecided about ECT Continue to take the Ativan  Every 2 hours  A: Encourage patient participation with unit programming . Instruction  Given on  Medication , verbalize understanding. R: Voice no other concerns. Staff continue to monitor

## 2016-07-17 NOTE — Progress Notes (Signed)
[facility] MD Progress Note  07/17/2016 4:52 PM Benjamin [name]  MRN:  [id number] Subjective:  "I am mostly focused on my children" this is an updated note for this 35 year old man with history of recurrent episodes of catatonia with unclear underlying condition. Yesterday he was catatonic. Today he is once again wide awake and appropriately interactive. He is denying any feeling of suicidal ideation or depression. Denies any psychosis. No longer endorsing any paranoia. His chief concern today is the court hearing that is happening tomorrow. Evidently there is a court hearing tomorrow on a restraining order which may ultimately lead to his being able to get full custody of his children which is an extremely important issue with him. Patient is requesting discharge. His behavior today has been appropriate. He has been compliant with medicine. Principal Problem: Major depressive disorder, recurrent episode, severe with catatonia ([place]) Diagnosis:   Patient Active Problem List   Diagnosis Date Noted  . PTSD (post-traumatic stress disorder) [F43.10] 07/15/2016  . OCD (obsessive compulsive disorder) [F42.9] 07/15/2016  . 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 Major depressive disorder, recurrent episode, severe with catatonia ([place]) [F33.2, F06.1] 07/09/2016  . Malingering [Z76.5] 09/29/2012  . 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 Bradycardia [R00.1] 09/18/2012  . Decreased oral intake [R63.8] 09/17/2012  . Psychosis [F29] 09/09/2012  . Severe episode of recurrent major depressive disorder, with psychotic features ([place]) [F33.3] 09/08/2012  . History of substance abuse [Z87.898] 09/08/2012   Total Time spent with patient: 45 minutes  Past Psychiatric History: Patient has a previous history of an episode of catatonia and odd behavior that took up the entire month of February 2014. At that time he was abusing drugs as well. Evidently he had been fairly stable without major problems other than some  degree of substance abuse for the last couple years until his sudden relapse into catatonia 1 week ago. No history of suicide attempts. No history of violence to others. Evidently had been not receiving mental health treatment for years in between the episodes. Never was clear in the past whether he had a depression but doesn't sound like he has a chronic psychotic disorder.  Past Medical History:  Past Medical History:  Diagnosis Date  . Depression   . Herniated disc    x3  . Hypertension   . Mental disorder     Past Surgical History:  Procedure Laterality Date  . SHOULDER SURGERY     Family History: History reviewed. No pertinent family history. Family Psychiatric  History: No history of any mental illness known Social History:  History  Alcohol Use No     History  Drug Use No    Social History   Social History  . Marital status: Single    Spouse name: N/A  . Number of children: N/A  . Years of education: N/A   Social History Main Topics  . Smoking status: Former Smoker    Packs/day: 1.00    Types: Cigarettes    Quit date: 04/04/2012  . Smokeless tobacco: Never Used  . Alcohol use No  . Drug use: No  . Sexual activity: No   Other Topics Concern  . None   Social History Narrative  . None   Additional Social History:                         Sleep: Fair  Appetite:  Fair  Current Medications: Current Facility-Administered Medications  Medication Dose Route Frequency Provider Last  Rate Last Dose  . acetaminophen (TYLENOL) tablet 650 mg  650 mg Oral Q6H PRN Clovis Fredrickson, MD   650 mg at 07/15/16 0402  . alum & mag hydroxide-simeth (MAALOX/MYLANTA) 200-200-20 MG/5ML suspension 30 mL  30 mL Oral Q4H PRN Jolanta B Pucilowska, MD      . ARIPiprazole (ABILIFY) tablet 5 mg  5 mg Oral Daily Gonzella Lex, MD   5 mg at 07/17/16 0802  . feeding supplement (ENSURE ENLIVE) (ENSURE ENLIVE) liquid 237 mL  237 mL Oral BID BM Gonzella Lex, MD   237 mL at  07/17/16 1400  . fluvoxaMINE (LUVOX) tablet 100 mg  100 mg Oral QHS Gonzella Lex, MD   100 mg at 07/16/16 2118  . LORazepam (ATIVAN) tablet 2 mg  2 mg Oral Q2H Jolanta B Pucilowska, MD   2 mg at 07/17/16 1618   Or  . LORazepam (ATIVAN) injection 2 mg  2 mg Intramuscular Q2H Jolanta B Pucilowska, MD   2 mg at 07/16/16 1755  . magnesium hydroxide (MILK OF MAGNESIA) suspension 30 mL  30 mL Oral Daily PRN Jolanta B Pucilowska, MD      . temazepam (RESTORIL) capsule 15 mg  15 mg Oral QHS Clovis Fredrickson, MD   15 mg at 07/16/16 2119    Lab Results:  Results for orders placed or performed during the hospital encounter of 07/14/16 (from the past 48 hour(s))  Urinalysis, Complete w Microscopic     Status: Abnormal   Collection Time: 07/16/16  7:45 AM  Result Value Ref Range   Color, Urine AMBER (A) YELLOW    Comment: BIOCHEMICALS MAY BE AFFECTED BY COLOR   APPearance HAZY (A) CLEAR   Specific Gravity, Urine 1.025 1.005 - 1.030   pH 6.0 5.0 - 8.0   Glucose, UA NEGATIVE NEGATIVE mg/dL   Hgb urine dipstick NEGATIVE NEGATIVE   Bilirubin Urine NEGATIVE NEGATIVE   Ketones, ur NEGATIVE NEGATIVE mg/dL   Protein, ur 30 (A) NEGATIVE mg/dL   Nitrite NEGATIVE NEGATIVE   Leukocytes, UA NEGATIVE NEGATIVE   RBC / HPF 0-5 0 - 5 RBC/hpf   WBC, UA 0-5 0 - 5 WBC/hpf   Bacteria, UA RARE (A) NONE SEEN   Squamous Epithelial / LPF 0-5 (A) NONE SEEN   Mucous PRESENT    Hyaline Casts, UA PRESENT   Comprehensive metabolic panel     Status: Abnormal   Collection Time: 07/16/16  9:15 AM  Result Value Ref Range   Sodium 133 (L) 135 - 145 mmol/L   Potassium 4.0 3.5 - 5.1 mmol/L   Chloride 99 (L) 101 - 111 mmol/L   CO2 28 22 - 32 mmol/L   Glucose, Bld 135 (H) 65 - 99 mg/dL   BUN 16 6 - 20 mg/dL   Creatinine, Ser 0.75 0.61 - 1.24 mg/dL   Calcium 9.3 8.9 - 10.3 mg/dL   Total Protein 7.1 6.5 - 8.1 g/dL   Albumin 4.1 3.5 - 5.0 g/dL   AST 37 15 - 41 U/L   ALT 40 17 - 63 U/L   Alkaline Phosphatase 37 (L) 38  - 126 U/L   Total Bilirubin 0.3 0.3 - 1.2 mg/dL   GFR calc non Af Amer >60 >60 mL/min   GFR calc Af Amer >60 >60 mL/min    Comment: (NOTE) The eGFR has been calculated using the CKD EPI equation. This calculation has not been validated in all clinical situations. eGFR's persistently <60 mL/min signify possible  Chronic Kidney Disease.    Anion gap 6 5 - 15  CBC with Differential/Platelet     Status: None   Collection Time: 07/16/16  9:15 AM  Result Value Ref Range   WBC 7.5 3.8 - 10.6 K/uL   RBC 5.10 4.40 - 5.90 MIL/uL   Hemoglobin 15.7 13.0 - 18.0 g/dL   HCT 44.2 40.0 - 52.0 %   MCV 86.6 80.0 - 100.0 fL   MCH 30.8 26.0 - 34.0 pg   MCHC 35.6 32.0 - 36.0 g/dL   RDW 12.9 11.5 - 14.5 %   Platelets 223 150 - 440 K/uL   Neutrophils Relative % 82 %   Neutro Abs 6.2 1.4 - 6.5 K/uL   Lymphocytes Relative 13 %   Lymphs Abs 1.0 1.0 - 3.6 K/uL   Monocytes Relative 4 %   Monocytes Absolute 0.3 0.2 - 1.0 K/uL   Eosinophils Relative 1 %   Eosinophils Absolute 0.1 0 - 0.7 K/uL   Basophils Relative 0 %   Basophils Absolute 0.0 0 - 0.1 K/uL    Blood Alcohol level:  Lab Results  Component Value Date   ETH <5 07/14/2016   ETH <5 85/63/1497    Metabolic Disorder Labs: Lab Results  Component Value Date   HGBA1C 5.2 07/15/2016   MPG 103 07/15/2016   No results found for: PROLACTIN Lab Results  Component Value Date   CHOL 187 07/15/2016   TRIG 86 07/15/2016   HDL 30 (L) 07/15/2016   CHOLHDL 6.2 07/15/2016   VLDL 17 07/15/2016   LDLCALC 140 (H) 07/15/2016    Physical Findings: AIMS:  , ,  ,  ,    CIWA:    COWS:     Musculoskeletal: Strength & Muscle Tone: within normal limits Gait & Station: normal Patient leans: N/A  Psychiatric Specialty Exam: Physical Exam  Nursing note and vitals reviewed. Constitutional: He appears well-developed and well-nourished.  HENT:  Head: Normocephalic and atraumatic.  Eyes: Conjunctivae are normal. Pupils are equal, round, and reactive  to light.  Neck: Normal range of motion.  Cardiovascular: Regular rhythm and normal heart sounds.   Respiratory: Effort normal. No respiratory distress.  GI: Soft.  Musculoskeletal: Normal range of motion.  Neurological: He is alert.  Skin: Skin is warm and dry.  Psychiatric: He has a normal mood and affect. His speech is normal and behavior is normal. Judgment and thought content normal. He exhibits abnormal recent memory.    Review of Systems  Constitutional: Negative.   HENT: Negative.   Eyes: Negative.   Respiratory: Negative.   Cardiovascular: Negative.   Gastrointestinal: Negative.   Musculoskeletal: Negative.   Skin: Negative.   Neurological: Negative.   Psychiatric/Behavioral: Positive for memory loss. The patient is nervous/anxious.     Blood pressure 114/74, pulse 74, temperature 98.3 F (36.8 C), temperature source Oral, resp. rate 18, SpO2 97 %.There is no height or weight on file to calculate BMI.  General Appearance: Casual  Eye Contact:  Good  Speech:  Normal Rate  Volume:  Normal  Mood:  Anxious  Affect:  Congruent  Thought Process:  Goal Directed  Orientation:  Full (Time, Place, and Person)  Thought Content:  Logical  Suicidal Thoughts:  No  Homicidal Thoughts:  No  Memory:  Immediate;   Good Recent;   Poor Remote;   Fair  Judgement:  Fair  Insight:  Fair  Psychomotor Activity:  Normal  Concentration:  Concentration: Fair  Recall:  Fair  Fund of Knowledge:  Good  Language:  Good  Akathisia:  No  Handed:  Right  AIMS (if indicated):     Assets:  Communication Skills Desire for Improvement Financial Resources/Insurance Housing Physical Health Social Support  ADL's:  Intact  Cognition:  WNL  Sleep:  Number of Hours: 5.45     Treatment Plan Summary: Daily contact with patient to assess and evaluate symptoms and progress in treatment, Medication management and Plan Patient interviewed chart reviewed. Spoke with nursing and social work today as  well. In addition I had a long conversation with the patient's brother who is his only close living relative. I attempted to call the patient's attorney (with his consent). Patient's chief complaint is that he wants to be discharged so that he can attend a court hearing tomorrow. Normally this is not something we would consider but I have confirmed with the patient's brother that this is genuinely an extremely important situation and that the patient's attorney has already continued it more than once and has told the family that it probably can't be continued again. Patient today is calm and lucid. He has not shown any acutely dangerous or aggressive behavior today. He was very willing to admit to having had mental health issues and was willing to comply with medication and has been compliant with medication today. The patient has not been and there is no concern about suicidality or violence to others. After due consideration and I have decided to [facility] the patient's request that he be discharged from the hospital so that he can attend the court hearing. Patient has been educated about the risk of relapse into catatonia. As a result we are agreeing to have him discharged early tomorrow morning, about 6:00, rather than leaving tonight. He will be given his medication in the morning before discharge. Additionally the patient has needed that he will return to the hospital emergency room for likely readmission and reevaluation so that we can try and get this problem solidly treated. Brother says that he believes that this is a safe plan and agrees to help facilitated by arranging for transportation for the patient. Therefore I discontinued IVC, discharge orders will be written. Patient should get his medicine in the morning. He will not need any prescriptions.  Alethia Berthold, MD 07/17/2016, 4:52 PM

## 2016-07-17 NOTE — [facility] Suicide Risk Assessment (Signed)
[facility]   Principal Problem: Major depressive disorder, recurrent episode, severe with catatonia [facility](HCC) Discharge Diagnoses:  Patient Active Problem List   Diagnosis Date Noted  . PTSD (post-traumatic stress disorder) [F43.10] 07/15/2016  . OCD (obsessive compulsive disorder) [F42.9] 07/15/2016  . 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 Malingering [Z76.5] 09/29/2012  . 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 Bradycardia [R00.1] 09/18/2012  . Decreased oral intake [R63.8] 09/17/2012  . Psychosis [F29] 09/09/2012  . Severe episode of recurrent major depressive disorder, with psychotic features (HCC) [F33.3] 09/08/2012  . History of substance abuse [Z87.898] 09/08/2012    Total Time spent with patient: 45 minutes  Musculoskeletal: Strength & Muscle Tone: within normal limits Gait & Station: normal Patient leans: N/A  Psychiatric Specialty Exam: Review of Systems  Constitutional: Negative.   HENT: Negative.   Eyes: Negative.   Respiratory: Negative.   Cardiovascular: Negative.   Gastrointestinal: Negative.   Musculoskeletal: Negative.   Skin: Negative.   Neurological: Negative.   Psychiatric/Behavioral: Positive for memory loss. The patient is nervous/anxious and has insomnia.     Blood pressure 114/74, pulse 74, temperature 98.3 F (36.8 C), temperature source Oral, resp. rate 18, SpO2 97 %.There is no height or weight on file to calculate BMI.  General Appearance: Casual  Eye Contact::  Good  Speech:  Clear and Coherent409  Volume:  Normal  Mood:  Anxious  Affect:  Congruent  Thought Process:  Goal Directed  Orientation:  Full (Time, Place, and Person)  Thought Content:  Logical  Suicidal Thoughts:  No  Homicidal Thoughts:  No  Memory:  Immediate;   Good Recent;   Poor Remote;   Fair  Judgement:  Fair  Insight:  Fair   Psychomotor Activity:  Normal  Concentration:  Fair  Recall:  [employer]  Fund of Knowledge:Fair  Language: Fair  Akathisia:  No  Handed:  Right  AIMS (if indicated):     Assets:  Communication Skills Desire for Improvement Housing Physical Health Resilience  Sleep:  Number of Hours: 5.45  Cognition: WNL  ADL's:  Intact   Mental Status Per Nursing Assessment::   On Admission:     Demographic Factors:  Male, Divorced or widowed and Unemployed  Loss Factors: Loss of significant relationship  Historical Factors: NA  Risk Reduction Factors:   Responsible for children under 37 years of age, Sense of responsibility to family and Religious beliefs about death  Continued Clinical Symptoms:  Depression:   Insomnia  Cognitive Features That Contribute To Risk:  Loss of executive function    Suicide Risk:  Minimal: No identifiable suicidal ideation.  Patients presenting with no risk factors but with morbid ruminations; may be classified as minimal risk based on the severity of the depressive symptoms  Follow-up Information    [facility] Go on 07/21/2016.   Why:  [facility] Monday, 12/18 between 8:30am-2PM. It is important that you bring your discharge paperwork to this appointment. Contact information: Address: [address] [place], [place] [address] Phone: [phone] Fax: [phone]          Plan Of Care/Follow-up recommendations:  Activity:  Activity as tolerated Diet:  Normal diet Other:  Patient will be discharged on the morning of Friday the 15th early in the morning with an agreement that he will voluntarily return in  the  Benjamin RasmussenJohn Hamsini Verrilli, MD 07/17/2016, 5:01 PM

## 2016-07-17 NOTE — BHH Suicide Risk Assessment (Signed)
B[facility] INPATIENT:  Family/Significant Other Suicide Prevention Education  Suicide Prevention Education:  Education Completed; brother, Benjamin [name] ph#: [phone] has been identified by the patient as the family member/significant other with whom the patient will be residing, and identified as the person(s) who will aid the patient in the event of a mental health crisis (suicidal ideations/suicide attempt).  With written consent from the patient, the family member/significant other has been provided the following suicide prevention education, prior to the and/or following the discharge of the patient.  The suicide prevention education provided includes the following:  Suicide risk factors  Suicide prevention and interventions  National Suicide Hotline telephone number  [facility] assessment telephone number  [facility] Assistance 911  [facility] and/or Residential Mobile Crisis Unit telephone number  Request made of family/significant other to:  Remove weapons (e.g., guns, rifles, knives), all items previously/currently identified as safety concern.    Remove drugs/medications (over-the-counter, prescriptions, illicit drugs), all items previously/currently identified as a safety concern.  The family member/significant other verbalizes understanding of the suicide prevention education information provided.  The family member/significant other agrees to remove the items of safety concern listed above.  Lynden OxfordKadijah R Kaylon [name], MSW, LCSW-A 07/17/2016, 12:41 PM

## 2016-07-18 ENCOUNTER — Emergency Department
Admission: EM | Admit: 2016-07-18 | Discharge: 2016-07-21 | Disposition: A | Discharge status: Other Acute Inpatient Hospital | Payer: No Typology Code available for payment source | Attending: Emergency Medicine | Admitting: Emergency Medicine

## 2016-07-18 ENCOUNTER — Encounter: Payer: Self-pay | Admitting: Emergency Medicine

## 2016-07-18 DIAGNOSIS — I10 Essential (primary) hypertension: Secondary | ICD-10-CM | POA: Insufficient documentation

## 2016-07-18 DIAGNOSIS — F329 Major depressive disorder, single episode, unspecified: Secondary | ICD-10-CM | POA: Insufficient documentation

## 2016-07-18 DIAGNOSIS — F4310 Post-traumatic stress disorder, unspecified: Secondary | ICD-10-CM | POA: Diagnosis present

## 2016-07-18 DIAGNOSIS — F333 Major depressive disorder, recurrent, severe with psychotic symptoms: Secondary | ICD-10-CM | POA: Diagnosis present

## 2016-07-18 DIAGNOSIS — Z87891 Personal history of nicotine dependence: Secondary | ICD-10-CM | POA: Insufficient documentation

## 2016-07-18 DIAGNOSIS — F32A Depression, unspecified: Secondary | ICD-10-CM

## 2016-07-18 DIAGNOSIS — F332 Major depressive disorder, recurrent severe without psychotic features: Secondary | ICD-10-CM

## 2016-07-18 DIAGNOSIS — F061 Catatonic disorder due to known physiological condition: Secondary | ICD-10-CM | POA: Diagnosis present

## 2016-07-18 DIAGNOSIS — R401 Stupor: Secondary | ICD-10-CM | POA: Diagnosis present

## 2016-07-18 DIAGNOSIS — F429 Obsessive-compulsive disorder, unspecified: Secondary | ICD-10-CM | POA: Diagnosis present

## 2016-07-18 LAB — CBC
HCT: 42.4 % (ref 40.0–52.0)
Hemoglobin: 15 g/dL (ref 13.0–18.0)
MCH: 30.8 pg (ref 26.0–34.0)
MCHC: 35.3 g/dL (ref 32.0–36.0)
MCV: 87.2 fL (ref 80.0–100.0)
PLATELETS: 230 10*3/uL (ref 150–440)
RBC: 4.87 MIL/uL (ref 4.40–5.90)
RDW: 13.2 % (ref 11.5–14.5)
WBC: 9.7 10*3/uL (ref 3.8–10.6)

## 2016-07-18 LAB — BASIC METABOLIC PANEL
Anion gap: 5 (ref 5–15)
BUN: 17 mg/dL (ref 6–20)
CALCIUM: 8.9 mg/dL (ref 8.9–10.3)
CO2: 29 mmol/L (ref 22–32)
CREATININE: 0.82 mg/dL (ref 0.61–1.24)
Chloride: 101 mmol/L (ref 101–111)
GFR calc Af Amer: >60 mL/min (ref >60)
GLUCOSE: 65 mg/dL (ref 65–99)
Potassium: 3.7 mmol/L (ref 3.5–5.1)
Sodium: 135 mmol/L (ref 135–145)

## 2016-07-18 LAB — ETHANOL: Alcohol, Ethyl (B): <5 mg/dL (ref <5)

## 2016-07-18 MED ORDER — LORAZEPAM 2 MG PO TABS
2.0000 mg | ORAL_TABLET | Freq: Once | ORAL | Status: AC
  Administered 2016-07-18: 2 mg via ORAL
  Filled 2016-07-18: qty 1

## 2016-07-18 MED ORDER — ARIPIPRAZOLE 5 MG PO TABS
5.0000 mg | ORAL_TABLET | Freq: Every day | ORAL | Status: DC
  Administered 2016-07-18 – 2016-07-21 (×4): 5 mg via ORAL
  Filled 2016-07-18 (×5): qty 1

## 2016-07-18 MED ORDER — FLUVOXAMINE MALEATE 100 MG PO TABS
100.0000 mg | ORAL_TABLET | Freq: Every day | ORAL | Status: DC
  Administered 2016-07-18 – 2016-07-20 (×3): 100 mg via ORAL
  Filled 2016-07-18 (×4): qty 1

## 2016-07-18 MED ORDER — LORAZEPAM 2 MG PO TABS
2.0000 mg | ORAL_TABLET | Freq: Three times a day (TID) | ORAL | Status: DC
  Administered 2016-07-18 – 2016-07-21 (×8): 2 mg via ORAL
  Filled 2016-07-18 (×9): qty 1

## 2016-07-18 NOTE — Plan of Care (Signed)
Problem: Self-Concept: Goal: Level of anxiety will decrease Outcome: Progressing Patient rates his anxiety at a 0

## 2016-07-18 NOTE — Progress Notes (Signed)
D: Patient flat but brightens on approach. Denies SI/HI/AVH. Patient states he plans on D/C at 0600 and will leave with uber. He has a court hearing for custody of his children.  A: Medication given with education. Encouragement provided.  R: Patient compliant with medication. He has remained calm and cooperative. Safety maintained with 15 min checks.

## 2016-07-18 NOTE — ED Notes (Signed)
Patient transferred to room 3, he is alert and oriented, Patient states that he wants to be here and hopes to get his children back home and that one of their teachers have them at this time,He went to court today and he states that judge knows He is to [facility].  patient denies Si/hi or avh, Patient with q 15 minute checks and camera monitoring at this time. Patient is pleasant and cooperative.

## 2016-07-18 NOTE — ED Notes (Signed)
Pt brought back to hallway 20, pt here on a promise that if he were discharged by dr clapacs this am to make a court appointment that he would voluntarily return to cont his treatment in the behavioral hospital. No distress noted at this time

## 2016-07-18 NOTE — ED Provider Notes (Signed)
[facility] Emergency Department Provider Note  Time seen: 3:42 PM  I have reviewed the triage vital signs and the nursing notes.   HISTORY  Chief Complaint behavioral health eval    HPI Benjamin [name] is a 35 y.o. male with a past medical history of depression, hypertension, PTSD, catatonia, who presents to the emergency department for mental health evaluation. According to record review and the patient he has been seen in the behavioral health unit for the past several days, was discharged early this morning to obtain a court hearing, but agreed to voluntarily return to the emergency department after the court hearing for admission to the hospital. Patient has a history of stressors, depression, which leads him to a catatonic state at times.Patient is here voluntarily, with no SI or HI. Denies alcohol or drug use. Patient has no medical complaints at this time.  Past Medical History:  Diagnosis Date  . Depression   . Herniated disc    x3  . Hypertension   . Mental disorder     Patient Active Problem List   Diagnosis Date Noted  . PTSD (post-traumatic stress disorder) 07/15/2016  . OCD (obsessive compulsive disorder) 07/15/2016  . 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 Major depressive disorder, recurrent episode, severe with catatonia (HCC) 07/09/2016  . Malingering 09/29/2012  . 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 Bradycardia 09/18/2012  . Decreased oral intake 09/17/2012  . Psychosis 09/09/2012  . Severe episode of recurrent major depressive disorder, with psychotic features (HCC) 09/08/2012  . History of substance abuse 09/08/2012    Past Surgical History:  Procedure Laterality Date  . SHOULDER SURGERY      Prior to Admission medications   Not on File    No Known Allergies  No family history on file.  Social History Social History  Substance Use Topics  . Smoking status: Former Smoker    Packs/day: 1.00    Types: Cigarettes    Quit  date: 04/04/2012  . Smokeless tobacco: Never Used  . Alcohol use No    Review of Systems Constitutional: Negative for fever Cardiovascular: Negative for chest pain. Respiratory: Negative for shortness of breath. Gastrointestinal: Negative for abdominal pain Musculoskeletal: Negative for back pain Neurological: Negative for headache 10-point ROS otherwise negative.  ____________________________________________   PHYSICAL EXAM:  Constitutional: Alert and oriented. Well appearing and in no distress. Eyes: Normal exam ENT   Head: Normocephalic and atraumatic.   Mouth/Throat: Mucous membranes are moist. Cardiovascular: Normal rate, regular rhythm. No murmur Respiratory: Normal respiratory effort without tachypnea nor retractions. Breath sounds are clear  Gastrointestinal: Soft and nontender. No distention.  Musculoskeletal: Nontender with normal range of motion in all extremities.  Neurologic:  Normal speech and language. No gross focal neurologic deficits  Skin:  Skin is warm, dry and intact.  Psychiatric: Flat affect. Denies SI or HI.  ____________________________________________   INITIAL IMPRESSION / ASSESSMENT AND PLAN / ED COURSE  Pertinent labs & imaging results that were available during my care of the patient were reviewed by me and considered in my medical decision making (see chart for details).  The patient presents to the emergency department for mental health evaluation after being evaluated by psychiatry. Patient denies SI or HI but does admit depression. Patient has a history of catatonia. Psychiatry is expecting the patient, and plans to admit to their service for further workup. We will check a urinalysis/urine drug screen, we will hold off on further lab testing at this time.  Labs are  largely within normal limits, urinalysis pending. Patient will be admitted to the psychiatry unit once a bed becomes available for further  treatment.  ____________________________________________   FINAL CLINICAL IMPRESSION(S) / ED DIAGNOSES  Depression    Minna AntisKevin Millianna Szymborski, MD 07/18/16 1719

## 2016-07-18 NOTE — B[facility] Group Notes (Signed)
[facility] Group Notes:  (Nursing/MHT/Case Management/Adjunct)  Date:  07/18/2016  Time:  5:55 AM  Type of Therapy:  Psychoeducational Skills  Participation Level:  Active  Participation Quality:  Appropriate  Affect:  Appropriate  Cognitive:  Appropriate  Insight:  Appropriate and Good  Engagement in Group:  Engaged  Modes of Intervention:  Discussion, Socialization and Support  Summary of Progress/Problems:  Benjamin MilroyLaquanda Y Harl [name] 07/18/2016, 5:55 AM

## 2016-07-18 NOTE — Progress Notes (Signed)
Patient discharged while accompanied with security officers to ride. Patient denies SI/HI/AVH. Vital Signs stable. BH transition, plan of care, and AVS reviewed with patient. Belongings returned.

## 2016-07-18 NOTE — ED Triage Notes (Addendum)
Pt comes into the ED via POV wanting a behavioral health eval.  [facility] thing where he states he went into a "catatonic state" due to high stress with his kids.  Patient explains that he is in the middle of working on custody issue for his children and it has put a lot of stress on him.  Patient denies any SI or HI. Patient explains that he spoke with Dr. Toni Amendlapacs and that he was to be discharge from the behavioral health unit so he could go to court this morning and that there was an agreement for him to return voluntarily.

## 2016-07-18 NOTE — ED Notes (Signed)
Tired multiple times to assess patient. Patient would not respond to Clinical research associatewriter. Patient took medications with no issues but with slow movements. Will continue to monitor.

## 2016-07-18 NOTE — BH Assessment (Signed)
Assessment Note  Benjamin HensenJoshua [name] is an 35 y.o. male who presents to the ER due to increase of stress and anxiety. Patient reports of having a history of depression and poor coping skills. In the past, when stressed, he becomes catatonic and have delusional thinking. Current stressors includes, recent job lost and involvement with the courts to gain custody of his children.  During the interview, patient was calm, cooperative and polite.  He provided appropriate answers to the questions.  He denies having history of violence and aggression. Only involvement with the legal system is the custody case.  Patient was [facility] inpatient with [facility].  At the time of this interview, patient denies SI/HI and AV/H.  Diagnosis: Depression  Past Medical History:  Past Medical History:  Diagnosis Date  . Depression   . Herniated disc    x3  . Hypertension   . Mental disorder     Past Surgical History:  Procedure Laterality Date  . SHOULDER SURGERY      Family History: No family history on file.  Social History:  reports that he quit smoking about 4 years ago. His smoking use included Cigarettes. He smoked 1.00 pack per day. He has never used smokeless tobacco. He reports that he does not drink alcohol or use drugs.  Additional Social History:  Alcohol / Drug Use Pain Medications: See PTA Prescriptions: See PTA Over the Counter: See PTA History of alcohol / drug use?: No history of alcohol / drug abuse Longest period of sobriety (when/how long): Reports of none Withdrawal Symptoms:  (Reports of none)  CIWA:   COWS:    Allergies: No Known Allergies  Home Medications:  (Not in a hospital admission)  OB/GYN Status:  No LMP for male patient.  General Assessment Data [facility] Assessment: In system Is this a Tele or Face-to-Face Assessment?: Face-to-Face Is this an Initial Assessment or a Re-assessment for this encounter?: Initial Assessment Marital  status: Single Maiden name: n/a Is patient pregnant?: No Pregnancy Status: No Living Arrangements: Children, Other relatives Can pt return to current living arrangement?: Yes Admission Status: Voluntary Is patient capable of signing voluntary admission?: Yes Referral Source: Self/Family/Friend Insurance type: None  Medical Screening Exam [facility] Walk-in ONLY) Medical Exam completed: Yes  Crisis Care Plan Living Arrangements: Children, Other relatives Legal Guardian: Other: (Self) Name of Psychiatrist: Reports of none Name of Therapist: Reports of none  Education Status Is patient currently in school?: No Current Grade: n/a Highest grade of school patient has completed: 4 year college  Name of school: n/a Contact person: n/a  Risk to self with the past 6 months Suicidal Ideation: No-Not Currently/Within Last 6 Months Has patient been a risk to self within the past 6 months prior to admission? : No Suicidal Intent: No-Not Currently/Within Last 6 Months Has patient had any suicidal intent within the past 6 months prior to admission? : No Is patient at risk for suicide?: No Suicidal Plan?: No-Not Currently/Within Last 6 Months Has patient had any suicidal plan within the past 6 months prior to admission? : No Access to Means: No What has been your use of drugs/alcohol within the last 12 months?: Cannabis, according to chart Previous Attempts/Gestures: No How many times?: 0 Other Self Harm Risks: Reports of none Triggers for Past Attempts: Unknown Intentional Self Injurious Behavior: None Family Suicide History: Unknown Recent stressful life event(s): Other (Comment), Financial Problems, Job Loss (Caring for his children, lost job.) Persecutory voices/beliefs?: No Depression: Yes  Depression Symptoms: Feeling worthless/self pity, Loss of interest in usual pleasures, Guilt, Fatigue, Isolating, Tearfulness, Insomnia, Despondent Substance abuse history and/or treatment for  substance abuse?: Yes Suicide prevention information given to non-admitted patients: Not applicable  Risk to Others within the past 6 months Homicidal Ideation: No Does patient have any lifetime risk of violence toward others beyond the six months prior to admission? : No Thoughts of Harm to Others: No Current Homicidal Intent: No Current Homicidal Plan: No Access to Homicidal Means: No Identified Victim: Reports of none History of harm to others?: No Assessment of Violence: None Noted Violent Behavior Description: Reports of none Does patient have access to weapons?: No Criminal Charges Pending?: No Does patient have a court date: No Is patient on probation?: No  Psychosis Hallucinations: None noted Delusions: None noted  Mental Status Report Appearance/Hygiene: In scrubs, Unremarkable Eye Contact: Fair Motor Activity: Freedom of movement, Unremarkable Speech: Logical/coherent, Unremarkable Level of Consciousness: Alert Mood: Anxious, Depressed, Pleasant Affect: Anxious, Appropriate to circumstance, Depressed Anxiety Level: Minimal Thought Processes: Coherent, Relevant Judgement: Unimpaired Orientation: Person, Place, Time, Situation, Appropriate for developmental age Obsessive Compulsive Thoughts/Behaviors: Minimal  Cognitive Functioning Concentration: Normal Memory: Recent Intact, Remote Intact IQ: Average Insight: Fair Impulse Control: Poor Appetite: Good Weight Loss: 0 Weight Gain: 0 Sleep: No Change Total Hours of Sleep: 8 Vegetative Symptoms: None  ADLScreening [facility] Assessment Services) Patient's cognitive ability adequate to safely complete daily activities?: Yes Patient able to express need for assistance with ADLs?: Yes Independently performs ADLs?: Yes (appropriate for developmental age)  Prior Inpatient Therapy Prior Inpatient Therapy: Yes Prior Therapy Dates: 07/2016  Prior [facility] Reason for Treatment: Depression  with psychosis ([place])  Prior Outpatient Therapy Prior Outpatient Therapy: No Prior Therapy Dates: Reports of none Prior Therapy Facilty/Provider(s): Reports of none Reason for Treatment: Reports of none Does patient have an ACCT team?: No Does patient have Intensive In-House Services?  : No Does patient have [facility] services? : No Does patient have P4CC services?: No  ADL Screening (condition at time of admission) Patient's cognitive ability adequate to safely complete daily activities?: Yes Is the patient deaf or have difficulty hearing?: No Does the patient have difficulty seeing, even when wearing glasses/contacts?: No Does the patient have difficulty concentrating, remembering, or making decisions?: No Patient able to express need for assistance with ADLs?: Yes Does the patient have difficulty dressing or bathing?: No Independently performs ADLs?: Yes (appropriate for developmental age) Does the patient have difficulty walking or climbing stairs?: No Weakness of Legs: None Weakness of Arms/Hands: None  Home Assistive Devices/Equipment Home Assistive Devices/Equipment: None  Therapy Consults (therapy consults require a physician order) PT Evaluation Needed: No OT Evalulation Needed: No SLP Evaluation Needed: No Abuse/Neglect Assessment (Assessment to be complete while patient is alone) Physical Abuse: Denies Verbal Abuse: Denies Sexual Abuse: Denies Exploitation of patient/patient's resources: Denies Self-Neglect: Denies Values / Beliefs Cultural Requests During Hospitalization: None Spiritual Requests During Hospitalization: None Consults Spiritual Care Consult Needed: No Social Work Consult Needed: No Merchant navy officerAdvance Directives (For Healthcare) Does Patient Have a Medical Advance Directive?: No    Additional Information 1:1 In Past 12 Months?: No CIRT Risk: No Elopement Risk: No Does patient have medical clearance?: Yes  Child/Adolescent Assessment Running Away  Risk: Denies (Patient is an adult)  Disposition:  Disposition Initial Assessment Completed for this Encounter: Yes Disposition of Patient: Other dispositions (ER MD Ordered Psych Consult)  On Site Evaluation by:   Reviewed with Physician:    Jerilynn Somalvin  Meredith Pel MS, LCAS, LPC, NCC, CCSI Therapeutic Triage Specialist 07/18/2016 7:38 PM

## 2016-07-18 NOTE — Consult Note (Signed)
[facility] Face[facility]to[facility] Consult   Reason for Consult:  Consult for 35 year old man with a history of catatonia who returns to the hospital for further treatment Referring Physician:  [facility] Brown Deeraduchowski Patient Identification: Benjamin HensenJoshua [name] MRN:  [id number] Principal Diagnosis: Severe episode of recurrent major depressive disorder, with psychotic features [facility](HCC) Diagnosis:   Patient Active Problem List   Diagnosis Date Noted  . PTSD (post-traumatic stress disorder) [F43.10] 07/15/2016  . OCD (obsessive compulsive disorder) [F42.9] 07/15/2016  . 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 Malingering [Z76.5] 09/29/2012  . 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 Bradycardia [R00.1] 09/18/2012  . Decreased oral intake [R63.8] 09/17/2012  . Psychosis [F29] 09/09/2012  . Severe episode of recurrent major depressive disorder, with psychotic features (HCC) [F33.3] 09/08/2012  . History of substance abuse [Z87.898] 09/08/2012    Total Time spent with patient: 45 minutes  Subjective:   Benjamin [name] is a 35 y.o. male patient admitted with "I'm feeling like it might come back".  HPI:  Patient interviewed. Chart reviewed. Patient very familiar to me from having just left the hospital. This is a 35 year old man who was in the psychiatric ward until first thing this morning. He was discharged because of his need to attend a court hearing. He returns reporting that his anxiety and catatonia seems to be coming back. He feels like he is starting to blank out again. His mood is nervous. Patient has been anxious and depressed for several weeks now. He has been having intermittent spells of catatonia during which she will become hyper religious and at times unresponsive and completely immobile. Sleep has been impaired. Oral intake has been impaired. Patient is under a lot of stress regarding his children and  a custody fight with his ex-girlfriend who is their mother. He recently had a court hearing and while things went well and it still does not permanently settle the issue. Also has lost his job and is under a lot of stress about that. He had just started on antidepressant and antipsychotic medicine and does not have outpatient follow-up in place yet.  Social history: Patient lives with his 2 young children. He had been employed until he recently lost his job. Now out of work. Some financial difficulty. He is in a long-standing court battle mother of the children. Patient alleges that it appears there is some support for the idea that she has been on fit in her behavior.  Medical history: Other than his psychiatric problems no specific medical problems. He had been complaining of pain in his bladder area and some bradycardia recently but there is no clear diagnosis.  Substance abuse history: Patient has a history of abuse of several substances mostly opiates and alcohol but has been apparently clean for most of a year at least.  Past Psychiatric History: Patient has a history of prior catatonia in 2014. At that time he spent most of the month of [place] and [place]. In the interim apparently he had been functioning well without any treatment until his symptoms return just about 2 weeks ago. No history of suicide attempts or violence.  Risk to Self: Is patient at risk for suicide?: No Risk to Others:   Prior Inpatient Therapy:   Prior Outpatient Therapy:    Past Medical History:  Past Medical History:  Diagnosis Date  . Depression   . Herniated disc    x3  .  Hypertension   . Mental disorder     Past Surgical History:  Procedure Laterality Date  . SHOULDER SURGERY     Family History: No family history on file. Family Psychiatric  History: No known family history of mental illness Social History:  History  Alcohol Use No     History   Drug Use No    Social History   Social History  . Marital status: Single    Spouse name: N/A  . Number of children: N/A  . Years of education: N/A   Social History Main Topics  . Smoking status: Former Smoker    Packs/day: 1.00    Types: Cigarettes    Quit date: 04/04/2012  . Smokeless tobacco: Never Used  . Alcohol use No  . Drug use: No  . Sexual activity: No   Other Topics Concern  . None   Social History Narrative  . None   Additional Social History:    Allergies:  No Known Allergies  Labs: No results found for this or any previous visit (from the past 48 hour(s)).  Current Facility-Administered Medications  Medication Dose Route Frequency Provider Last Rate Last Dose  . ARIPiprazole (ABILIFY) tablet 5 mg  5 mg Oral Daily Aalaiyah Yassin T Kaylan Yates, MD      . fluvoxaMINE (LUVOX) tablet 100 mg  100 mg Oral QHS Caron Tardif T Dannah Ryles, MD      . LORazepam (ATIVAN) tablet 2 mg  2 mg Oral TID WC & HS Audery AmelJohn T Raunel Dimartino, MD       No current outpatient prescriptions on file.    Musculoskeletal: Strength & Muscle Tone: within normal limits Gait & Station: normal Patient leans: N/A  Psychiatric Specialty Exam: Physical Exam  Nursing note and vitals reviewed. Constitutional: He appears well-developed and well-nourished.  HENT:  Head: Normocephalic and atraumatic.  Eyes: Conjunctivae are normal. Pupils are equal, round, and reactive to light.  Neck: Normal range of motion.  Cardiovascular: Regular rhythm and normal heart sounds.   Respiratory: Effort normal. No respiratory distress.  GI: Soft.  Musculoskeletal: Normal range of motion.  Neurological: He is alert.  Skin: Skin is warm and dry.  Psychiatric: His speech is normal and behavior is normal. Judgment and thought content normal. His mood appears anxious. He exhibits abnormal recent memory.    Review of Systems  Constitutional: Negative.   HENT: Negative.   Eyes: Negative.   Respiratory: Negative.   Cardiovascular: Negative.    Gastrointestinal: Negative.   Musculoskeletal: Negative.   Skin: Negative.   Neurological: Negative.   Psychiatric/Behavioral: Positive for depression and memory loss. Negative for hallucinations, substance abuse and suicidal ideas. The patient is nervous/anxious and has insomnia.     Height 5\' 9"  (1.753 m), weight 81.6 kg (180 lb).Body mass index is 26.58 kg/m.  General Appearance: Casual  Eye Contact:  Fair  Speech:  Slow  Volume:  Decreased  Mood:  Anxious  Affect:  Congruent  Thought Process:  Goal Directed  Orientation:  Full (Time, Place, and Person)  Thought Content:  Logical  Suicidal Thoughts:  No  Homicidal Thoughts:  No  Memory:  Immediate;   Good Recent;   Fair Remote;   Fair  Judgement:  Fair  Insight:  Fair  Psychomotor Activity:  Decreased  Concentration:  Concentration: Fair  Recall:  FiservFair  Fund of Knowledge:  Fair  Language:  Fair  Akathisia:  No  Handed:  Right  AIMS (if indicated):     Assets:  Communication  Skills Desire for Improvement Financial Resources/Insurance Housing Physical Health Resilience  ADL's:  Intact  Cognition:  WNL  Sleep:        Treatment Plan Summary: Daily contact with patient to assess and evaluate symptoms and progress in treatment, Medication management and Plan 35 year old man with repeated episodes of somewhat unpredictable catatonia. He had been responding well to Ativan but still prone to lapsing into catatonia and is feeling panicky and catatonic again today already. We had agreed that he would come back to the hospital for readmission for further assessment and treatment. He will be admitted voluntarily to the unit downstairs. Restart Luvox and Abilify and standing doses of Ativan. Recheck urine analysis. Engage in appropriate groups and evaluation on the unit. 15 minute checks for now. Patient agrees to plan.  Disposition: Recommend psychiatric Inpatient admission when medically cleared. Supportive therapy provided  about ongoing stressors.  [name], MD 07/18/2016 4:21 PM

## 2016-07-18 NOTE — ED Notes (Signed)
Dr. Clapacs speaking with patient. 

## 2016-07-18 NOTE — BH Assessment (Signed)
[facility] by Dr. Toni Amendlapacs.  Attending Physician will be Dr. Toni Amendlapacs.   Patient has been assigned to room 306, [facility] Charge Nurse Candelero AbajoPhyllis.   Intake Paper Work has been signed and placed on patient chart.  ER staff is aware of the admission Irving Burton(Emily, ER Sect.; Dr. Lenard LancePaduchowski, ER MD; Vic RipperAmy B., Patient's Nurse & Caryn BeeGelayna W., Patient Access).

## 2016-07-19 MED ORDER — LORAZEPAM 2 MG/ML IJ SOLN
1.0000 mg | Freq: Once | INTRAMUSCULAR | Status: AC
  Administered 2016-07-19: 1 mg via INTRAMUSCULAR

## 2016-07-19 MED ORDER — LORAZEPAM 2 MG/ML IJ SOLN
INTRAMUSCULAR | Status: AC
  Administered 2016-07-19: 1 mg via INTRAMUSCULAR
  Filled 2016-07-19: qty 1

## 2016-07-19 NOTE — ED Notes (Signed)
Patient received supper tray. 

## 2016-07-19 NOTE — ED Notes (Signed)
Report was received from Amy H., RN; Pt. Verbalizes no complaints or distress; denies S.I./Hi.; has episodes of being mute; does answer some questions;  Continue to monitor with 15 min. Monitoring.

## 2016-07-19 NOTE — ED Notes (Signed)
Patient now verbally responsive. Requesting shower.

## 2016-07-19 NOTE — ED Notes (Signed)
Patient resting quietly in room. No noted distress or abnormal behaviors noted. Will continue 15 minute checks and observation by security camera for safety. 

## 2016-07-19 NOTE — Discharge Summary (Signed)
Physician Discharge Summary Note  Patient:  Benjamin [name] is an 35 y.o., male MRN:  [id number] DOB:  01-Sep-1980 Patient phone:  [phone] (home)  Patient address:   [address] [place] [address],  Total Time spent with patient: 30 minutes  Date of Admission:  07/14/2016 Date of Discharge: 07/18/2016  Reason for Admission:  Admitted in transfer from [facility] because of catatonia with possible need for ECT  Principal Problem: Major depressive disorder, recurrent episode, severe with catatonia [facility]) Discharge Diagnoses: Patient Active Problem List   Diagnosis Date Noted  . PTSD (post-traumatic stress disorder) [F43.10] 07/15/2016  . OCD (obsessive compulsive disorder) [F42.9] 07/15/2016  . 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 Malingering [Z76.5] 09/29/2012  . 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 Bradycardia [R00.1] 09/18/2012  . Decreased oral intake [R63.8] 09/17/2012  . Psychosis [F29] 09/09/2012  . Severe episode of recurrent major depressive disorder, with psychotic features (HCC) [F33.3] 09/08/2012  . History of substance abuse [Z87.898] 09/08/2012    Past Psychiatric History: Patient has a history of past episodes of catatonia. Possible depression. No history of suicide attempts. Responds adequately to benzodiazepines. History of chronic anxiety. Past history of substance abuse not active.  Past Medical History:  Past Medical History:  Diagnosis Date  . Depression   . Herniated disc    x3  . Hypertension   . Mental disorder     Past Surgical History:  Procedure Laterality Date  . SHOULDER SURGERY     Family History: History reviewed. No pertinent family history. Family Psychiatric  History: No known mental health history in family Social History:  History  Alcohol Use No     History  Drug Use No    Social History   Social History   . Marital status: Single    Spouse name: N/A  . Number of children: N/A  . Years of education: N/A   Social History Main Topics  . Smoking status: Former Smoker    Packs/day: 1.00    Types: Cigarettes    Quit date: 04/04/2012  . Smokeless tobacco: Never Used  . Alcohol use No  . Drug use: No  . Sexual activity: No   Other Topics Concern  . None   Social History Narrative  . None    Hospital Course:  Patient was in the hospital initially for evaluation for ECT however his catatonia resolved with Ativan. He had 1 or 2 relapses but was quickly able to pop out of them. Patient was discharged however because of the urgent need to attend a court hearing. At the time of discharge patient was awake alert oriented and showed pretty good insight. There was no indication of any acute suicidal or homicidal ideation. Patient was given his medicine in the morning with an agreement that he would return to the hospital in the evening for follow-up admission  Physical Findings: AIMS:  , ,  ,  ,    CIWA:    COWS:     Musculoskeletal: Strength & Muscle Tone: within normal limits Gait & Station: normal Patient leans: N/A  Psychiatric Specialty Exam: Physical Exam  Nursing note and vitals reviewed. Constitutional: He appears well-developed and well-nourished.  HENT:  Head: Normocephalic and atraumatic.  Eyes: Conjunctivae are normal. Pupils are equal, round, and reactive to light.  Neck: Normal range of motion.  Cardiovascular: Normal heart sounds.   Respiratory: Effort normal.  GI: Soft.  Musculoskeletal: Normal range of motion.  Neurological: He is alert.  Skin: Skin is warm and dry.  Psychiatric: He has a normal mood and affect. His behavior is normal. Judgment and thought content normal.    Review of Systems  Constitutional: Negative.   HENT: Negative.   Eyes: Negative.   Respiratory: Negative.   Cardiovascular: Negative.   Gastrointestinal: Negative.   Musculoskeletal:  Negative.   Skin: Negative.   Neurological: Negative.   Psychiatric/Behavioral: Positive for memory loss. Negative for depression, hallucinations, substance abuse and suicidal ideas. The patient is nervous/anxious and has insomnia.     Blood pressure 111/73, pulse 86, temperature 97.7 F (36.5 C), temperature source Oral, resp. rate 18, SpO2 100 %.There is no height or weight on file to calculate BMI.  General Appearance: Casual  Eye Contact:  Good  Speech:  Clear and Coherent  Volume:  Normal  Mood:  Anxious  Affect:  Congruent  Thought Process:  Goal Directed  Orientation:  Full (Time, Place, and Person)  Thought Content:  Logical  Suicidal Thoughts:  No  Homicidal Thoughts:  No  Memory:  Immediate;   Good Recent;   Fair Remote;   Fair  Judgement:  Fair  Insight:  Fair  Psychomotor Activity:  Normal  Concentration:  Concentration: Fair  Recall:  Fair  Fund of Knowledge:  Fair  Language:  Fair  Akathisia:  No  Handed:  Right  AIMS (if indicated):     Assets:  Communication Skills Desire for Improvement Financial Resources/Insurance Housing Physical Health Resilience  ADL's:  Intact  Cognition:  WNL  Sleep:  Number of Hours: 5.45     Have you used any form of tobacco in the last 30 days? (Cigarettes, Smokeless Tobacco, Cigars, and/or Pipes): No  Has this patient used any form of tobacco in the last 30 days? (Cigarettes, Smokeless Tobacco, Cigars, and/or Pipes) Yes, No  Blood Alcohol level:  Lab Results  Component Value Date   ETH <5 07/18/2016   ETH <5 07/14/2016    Metabolic Disorder Labs:  Lab Results  Component Value Date   HGBA1C 5.2 07/15/2016   MPG 103 07/15/2016   No results found for: PROLACTIN Lab Results  Component Value Date   CHOL 187 07/15/2016   TRIG 86 07/15/2016   HDL 30 (L) 07/15/2016   CHOLHDL 6.2 07/15/2016   VLDL 17 07/15/2016   LDLCALC 140 (H) 07/15/2016    See Psychiatric Specialty Exam and Suicide Risk Assessment completed  by Attending Physician prior to discharge.  Discharge destination:  Home  Is patient on multiple antipsychotic therapies at discharge:  No   Has Patient had three or more failed trials of antipsychotic monotherapy by history:  No  Recommended Plan for Multiple Antipsychotic Therapies: NA  Discharge Instructions    Diet - low sodium heart healthy    Complete by:  As directed    Increase activity slowly    Complete by:  As directed      Allergies as of 07/18/2016   No Known Allergies     Medication List    STOP taking these medications   benztropine 1 MG tablet Commonly known as:  COGENTIN   citalopram 10 MG tablet Commonly known as:  CELEXA   OLANZapine 10 MG tablet Commonly known as:  ZYPREXA      Follow-up Information    [facility] Go on 07/21/2016.   Why:  [facility] Monday, 12/18 between 8:30am-2PM. It is important that you bring your discharge paperwork to this appointment. Contact information: [address] [place], [place] [address] Phone: [phone] Fax: [phone]          Follow-up recommendations:  Activity:  Activity as tolerated Diet:  Regular diet Other:  Return to hospital in the afternoon for readmission  Comments:  Patient discharged with plans that he would be transported to his court hearing and return in the afternoon.  Signed: Mordecai RasmussenJohn Clapacs, MD 07/19/2016, 3:43 PM

## 2016-07-19 NOTE — ED Notes (Signed)
Patient received lunch tray 

## 2016-07-19 NOTE — ED Notes (Signed)
Patient appearing catatonic, unable to respond to verbal stimuli. Patient unable to cooperate with taking oral medications. Will discuss with MD for alternate route.  Maintained on 15 minute checks and observation by security camera for safety.

## 2016-07-19 NOTE — ED Notes (Signed)

## 2016-07-19 NOTE — ED Notes (Signed)

## 2016-07-19 NOTE — ED Provider Notes (Signed)
-----------------------------------------   6:18 AM on 07/19/2016 -----------------------------------------   Blood pressure 118/62, pulse 67, temperature 98 F (36.7 C), temperature source Oral, resp. rate 16, height 5\' 9"  (1.753 m), weight 180 lb (81.6 kg), SpO2 97 %.  The patient had no acute events since last update.  Calm and cooperative at this time.  Disposition is pending Psychiatry/Behavioral Medicine team recommendations.      Irean HongJade J Mindel Friscia, MD 07/19/16 [phone]

## 2016-07-20 MED ORDER — LORAZEPAM 2 MG/ML IJ SOLN
INTRAMUSCULAR | Status: AC
  Administered 2016-07-20: 2 mg
  Filled 2016-07-20: qty 1

## 2016-07-20 NOTE — ED Notes (Signed)

## 2016-07-20 NOTE — ED Notes (Signed)
Patient resting quietly in room. No noted distress or abnormal behaviors noted. Will continue 15 minute checks and observation by security camera for safety. 

## 2016-07-20 NOTE — ED Notes (Signed)
Patient was asked as to when he last urinated. He first responded that he was not able to walk to the bathroom. He then rolled over in bed and urinated into a plastic bucket. Patient was encouraged to ambulate. RN had patient do some simple leg stretches. Patient was able to sit up on the side of the bed and then stand. He was given positive feedback. RN suggested just walking in his room first but when he felt stronger, to try further distances.

## 2016-07-20 NOTE — ED Notes (Signed)
PT VOLUNTARY/SEEN BY DR CLAPACS/RECOMMENDS PSYCHIATRIC INPATIENT ADMISSION WHEN MEDICALLY CLEARED. 

## 2016-07-20 NOTE — ED Notes (Signed)
Patient currently appearing to be catatonic, not responding to verbal stimuli. Breakfast tray left at bedside. Maintained on 15 minute checks and observation by security camera for safety.

## 2016-07-20 NOTE — ED Notes (Signed)
Patient currently awake and sitting in common area. He is able to make conversation with RN. He is concerned about extended stay in the ED. He was told that treatment team was recommending ECT which would start when he was hospitalized. Maintained on 15 minute checks and observation by security camera for safety.

## 2016-07-20 NOTE — ED Notes (Signed)
Patient received supper tray. He has remained alert and oriented . Ambulating independently. Patient spoke to his brother on the phone. Maintained on 15 minute checks and observation by security camera for safety.

## 2016-07-20 NOTE — ED Notes (Signed)
Report was received from Amy H., RN; Pt. Verbalizes no complaints or distress; denies S.I./Hi. Continue to monitor with 15 min. Monitoring. 

## 2016-07-20 NOTE — ED Notes (Signed)

## 2016-07-20 NOTE — ED Provider Notes (Signed)
-----------------------------------------   6:53 AM on 07/20/2016 -----------------------------------------   Blood pressure 134/68, pulse 62, temperature 97.5 F (36.4 C), temperature source Oral, resp. rate 16, height 5\' 9"  (1.753 m), weight 180 lb (81.6 kg), SpO2 97 %.  The patient had no acute events since last update. Has history of intermittent catatonia, this time he is not speaking to me that he is in no distress.  Disposition is pending Psychiatry/Behavioral Medicine team recommendations.     Jeanmarie PlantJames A Malick Netz, MD 07/20/16 [phone]

## 2016-07-20 NOTE — ED Notes (Signed)
Patient asleep in room. No noted distress or abnormal behavior. Will continue 15 minute checks and observation by security cameras for safety. 

## 2016-07-20 NOTE — Consult Note (Signed)
Psychiatry: Still awaiting admission. Orders done Friday.

## 2016-07-21 ENCOUNTER — Inpatient Hospital Stay
Admission: EM | Admit: 2016-07-21 | Discharge: 2016-07-26 | DRG: 885 | Disposition: A | Discharge status: Home / Self Care | Payer: No Typology Code available for payment source | Source: Intra-hospital | Attending: Psychiatry | Admitting: Psychiatry

## 2016-07-21 ENCOUNTER — Encounter: Payer: Self-pay | Admitting: *Deleted

## 2016-07-21 DIAGNOSIS — I10 Essential (primary) hypertension: Secondary | ICD-10-CM | POA: Diagnosis present

## 2016-07-21 DIAGNOSIS — Z87891 Personal history of nicotine dependence: Secondary | ICD-10-CM

## 2016-07-21 DIAGNOSIS — F329 Major depressive disorder, single episode, unspecified: Secondary | ICD-10-CM | POA: Diagnosis present

## 2016-07-21 DIAGNOSIS — Z9889 Other specified postprocedural states: Secondary | ICD-10-CM | POA: Diagnosis not present

## 2016-07-21 DIAGNOSIS — F061 Catatonic disorder due to known physiological condition: Secondary | ICD-10-CM | POA: Diagnosis present

## 2016-07-21 DIAGNOSIS — F332 Major depressive disorder, recurrent severe without psychotic features: Secondary | ICD-10-CM | POA: Diagnosis not present

## 2016-07-21 DIAGNOSIS — F333 Major depressive disorder, recurrent, severe with psychotic symptoms: Principal | ICD-10-CM | POA: Diagnosis present

## 2016-07-21 MED ORDER — ARIPIPRAZOLE 10 MG PO TABS
5.0000 mg | ORAL_TABLET | Freq: Every day | ORAL | Status: DC
  Administered 2016-07-21 – 2016-07-26 (×5): 5 mg via ORAL
  Filled 2016-07-21 (×6): qty 1

## 2016-07-21 MED ORDER — ALUM & MAG HYDROXIDE-SIMETH 200-200-20 MG/5ML PO SUSP: 30.0000 mL | ORAL | Status: DC | PRN

## 2016-07-21 MED ORDER — MAGNESIUM HYDROXIDE 400 MG/5ML PO SUSP: 30.0000 mL | Freq: Every day | ORAL | Status: DC | PRN

## 2016-07-21 MED ORDER — LORAZEPAM 2 MG/ML IJ SOLN
2.0000 mg | INTRAMUSCULAR | Status: DC | PRN
  Administered 2016-07-24: 2 mg via INTRAMUSCULAR
  Filled 2016-07-21: qty 1

## 2016-07-21 MED ORDER — TRAZODONE HCL 100 MG PO TABS
100.0000 mg | ORAL_TABLET | Freq: Every evening | ORAL | Status: DC | PRN
  Administered 2016-07-21 – 2016-07-24 (×3): 100 mg via ORAL
  Filled 2016-07-21 (×3): qty 1

## 2016-07-21 MED ORDER — ACETAMINOPHEN 325 MG PO TABS
650.0000 mg | ORAL_TABLET | Freq: Four times a day (QID) | ORAL | Status: DC | PRN
  Administered 2016-07-21 – 2016-07-25 (×2): 650 mg via ORAL
  Filled 2016-07-21 (×2): qty 2

## 2016-07-21 MED ORDER — LORAZEPAM 2 MG PO TABS
2.0000 mg | ORAL_TABLET | ORAL | Status: DC | PRN
  Administered 2016-07-21 – 2016-07-22 (×7): 2 mg via ORAL
  Filled 2016-07-21 (×7): qty 1

## 2016-07-21 MED ORDER — FLUVOXAMINE MALEATE 50 MG PO TABS
100.0000 mg | ORAL_TABLET | Freq: Every day | ORAL | Status: DC
  Administered 2016-07-21 – 2016-07-25 (×4): 100 mg via ORAL
  Filled 2016-07-21 (×5): qty 2

## 2016-07-21 NOTE — [facility] Suicide Risk Assessment (Signed)
[facility]   Nursing information obtained from:  Patient Demographic factors:  Male, Living alone Current Mental Status:  NA Loss Factors:  Loss of significant relationship Historical Factors:  NA Risk Reduction Factors:  Responsible for children under 37 years of age  Total Time spent with patient: 45 minutes Principal Problem: MDD (major depressive disorder) Diagnosis:   Patient Active Problem List   Diagnosis Date Noted  . Severe recurrent major depression with psychotic features (HCC) [F33.3] 07/21/2016  . MDD (major depressive disorder) [F32.9] 07/21/2016  . PTSD (post-traumatic stress disorder) [F43.10] 07/15/2016  . OCD (obsessive compulsive disorder) [F42.9] 07/15/2016  . 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 Bradycardia [R00.1] 09/18/2012  . Decreased oral intake [R63.8] 09/17/2012  . Psychosis [F29] 09/09/2012  . Severe episode of recurrent major depressive disorder, with psychotic features (HCC) [F33.3] 09/08/2012  . History of substance abuse [Z87.898] 09/08/2012   Subjective Data: Patient denies any current suicidal ideation. Mood is mildly anxious and depressed. Patient is not reporting active psychotic symptoms. Patient has positive things in his life he is looking forward to. No specific physical complaints other than difficulty with sleep.  Continued Clinical Symptoms:  Alcohol Use Disorder Identification Test Final Score (AUDIT): 0 The "Alcohol Use Disorders Identification Test", Guidelines for Use in Primary Care, Second Edition.  World Science writerHealth [employer] [facility](WHO). Score between 0-7:  no or low risk or alcohol related problems. Score between 8-15:  moderate risk of alcohol related problems. Score between 16-19:  high risk of alcohol related problems. Score 20 or above:  warrants further  diagnostic evaluation for alcohol dependence and treatment.   CLINICAL FACTORS:   Panic Attacks Depression:   Impulsivity   Musculoskeletal: Strength & Muscle Tone: within normal limits Gait & Station: normal Patient leans: N/A  Psychiatric Specialty Exam: Physical Exam  Nursing note and vitals reviewed. Constitutional: He appears well-developed and well-nourished.  HENT:  Head: Normocephalic and atraumatic.  Eyes: Conjunctivae are normal. Pupils are equal, round, and reactive to light.  Neck: Normal range of motion.  Cardiovascular: Regular rhythm and normal heart sounds.   Respiratory: Effort normal. No respiratory distress.  GI: Soft.  Musculoskeletal: Normal range of motion.  Neurological: He is alert.  Skin: Skin is warm and dry.  Psychiatric: Judgment normal. His mood appears anxious. His speech is delayed. He is slowed. Thought content is not paranoid. He expresses no homicidal and no suicidal ideation. He exhibits abnormal recent memory.    Review of Systems  Constitutional: Negative.   HENT: Negative.   Eyes: Negative.   Respiratory: Negative.   Cardiovascular: Negative.   Gastrointestinal: Negative.   Musculoskeletal: Negative.   Skin: Negative.   Neurological: Negative.   Psychiatric/Behavioral: Positive for depression and memory loss. Negative for hallucinations, substance abuse and suicidal ideas. The patient is nervous/anxious and has insomnia.     Blood pressure 111/72, pulse 76, temperature 98.1 F (36.7 C), temperature source Oral, resp. rate 16, height 5\' 9"  (1.753 m), weight 79.8 kg (176 lb), SpO2 98 %.Body mass index is 25.99 kg/m.  General Appearance: Casual  Eye Contact:  Good  Speech:  Slow  Volume:  Decreased  Mood:  Anxious  Affect:  Constricted  Thought Process:  Goal Directed  Orientation:  Full (Time, Place, and Person)  Thought Content:  Logical  Suicidal Thoughts:  No  Homicidal Thoughts:  No  Memory:  Immediate;   Good Recent;    Fair Remote;   Fair  Judgement:  Fair  Insight:  Fair  Psychomotor Activity:  Normal  Concentration:  Concentration: Fair  Recall:  [employer]Fair  Fund of Knowledge:  Fair  Language:  Fair  Akathisia:  No  Handed:  Right  AIMS (if indicated):     Assets:  Communication Skills Desire for Improvement Housing Physical Health Social Support  ADL's:  Intact  Cognition:  WNL  Sleep:         COGNITIVE FEATURES THAT CONTRIBUTE TO RISK:  Loss of executive function    SUICIDE RISK:   Minimal: No identifiable suicidal ideation.  Patients presenting with no risk factors but with morbid ruminations; may be classified as minimal risk based on the severity of the depressive symptoms   PLAN OF CARE: Patient has been readmitted to the psychiatric floor for further evaluation and observation in treatment planning. Patient's suicidality will be reassessed prior to discharge and documented. He is being treated for depression and anxiety. He will be referred to outpatient treatment appropriately at discharge.  I certify that inpatient services furnished can reasonably be expected to improve the patient's condition.  Mordecai RasmussenJohn Clapacs, MD 07/21/2016, 6:37 PM

## 2016-07-21 NOTE — Progress Notes (Signed)
Patient admitted to unit, pleasant and cooperative with care. Pt able to answer questions, but states I would like to keep my stressors to myself. Pt reports his children is a big stressor, but would not elaborate. Pt reports here for ECT. Denies SI, HI, AVH. Skin and contraband search completed and witnessed by [facility] LLCGwen, Charity fundraiserN. No contraband found, Pt has blister to top of left foot.  Admission assessment completed. Fluid and nutrition offered. Pt remains safe on unit with q 15 min checks.

## 2016-07-21 NOTE — ED Provider Notes (Signed)
-----------------------------------------   7:45 AM on 07/21/2016 -----------------------------------------   Blood pressure 111/63, pulse 66, temperature 98 F (36.7 C), temperature source Oral, resp. rate 16, height 5\' 9"  (1.753 m), weight 180 lb (81.6 kg), SpO2 97 %.  The patient had no acute events since last update.  Calm and cooperative at this time.  Disposition is pending Psychiatry/Behavioral Medicine team recommendations.     Irean HongJade J Sung, MD 07/21/16 [phone]

## 2016-07-21 NOTE — ED Notes (Signed)
Pt sitting up in bed watching TV.  Pt complaint with 8 am medication. Pt did not speak to RN, only nodded head.  No concerns voiced. Maintained on 15 minute checks and observation by security camera for safety.

## 2016-07-21 NOTE — H&P (Signed)
Psychiatric Admission Assessment Adult  Patient Identification: Benjamin [name] MRN:  [id number] Date of Evaluation:  07/21/2016 Chief Complaint:  Depression Principal Diagnosis: MDD (major depressive disorder) Diagnosis:   Patient Active Problem List   Diagnosis Date Noted  . Severe recurrent major depression with psychotic features (HCC) [F33.3] 07/21/2016  . MDD (major depressive disorder) [F32.9] 07/21/2016  . PTSD (post-traumatic stress disorder) [F43.10] 07/15/2016  . OCD (obsessive compulsive disorder) [F42.9] 07/15/2016  . 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 Bradycardia [R00.1] 09/18/2012  . Decreased oral intake [R63.8] 09/17/2012  . Psychosis [F29] 09/09/2012  . Severe episode of recurrent major depressive disorder, with psychotic features (HCC) [F33.3] 09/08/2012  . History of substance abuse [Z87.898] 09/08/2012   History of Present Illness: 35 year old man who was readmitted through the emergency room after having been discharged briefly in order to go to a court hearing. See previous notes from these hospital stays for more detail. This patient has a past history of anxiety and depression and recently over the last month or so has rapidly developed episodes of catatonia that seem to be related to severe anxiety. He was referred to our hospital from [facility] for consideration of ECT however his catatonia resolved very quickly and without the use of ECT. Benzodiazepines appear to of been effective in keeping him mostly out of the catatonic spells. He is not catatonic he has moderate anxiety and depression but is no longer reporting any psychotic symptoms and does not have any suicidality. Patient had been released on a discharge for a crucial court hearing that he could attend on Friday and then returned in the afternoon. My  intention had been to admit him directly back to the unit but because of nursing shortage as he spent the weekend in the emergency room. Today he is somewhat irritated about that experience. He has had a couple more brief episodes of catatonic-like behavior since being downstairs in the emergency room but today seems to be feeling better. Mild to moderate anxiety and depression. Denies hallucinations or paranoia. Doesn't appear to have psychotic thinking. No suicidal or homicidal ideation. Associated Signs/Symptoms: Depression Symptoms:  fatigue, impaired memory, anxiety, (Hypo) Manic Symptoms:  None Anxiety Symptoms:  Catatonic spells seem to be mostly related to acute worsening's of his anxiety Psychotic Symptoms:  None currently PTSD Symptoms: Negative Total Time spent with patient: 45 minutes  Past Psychiatric History: patient has a past history of hospitalization several years ago and then a long interval without psychiatric treatment. Does not have a past history of suicide attempts. He does have a history of substance abuse but is currently not drinking or abusing narcotics.  Is the patient at risk to self? No.  Has the patient been a risk to self in the past 6 months? No.  Has the patient been a risk to self within the distant past? No.  Is the patient a risk to others? No.  Has the patient been a risk to others in the past 6 months? No.  Has the patient been a risk to others within the distant past? No.   Prior Inpatient Therapy:  patient had an extended inpatient treatment about 3 years ago. Then had a long period of time without any further inpatient treatment until just this past month Prior Outpatient Therapy:  has not really participated much in outpatient treatment  Alcohol Screening: 1. How often do  you have a drink containing alcohol?: Never 2. How many drinks containing alcohol do you have on a typical day when you are drinking?: 1 or 2 3. How often do you have six or more  drinks on one occasion?: Never Preliminary Score: 0 9. Have you or someone else been injured as a result of your drinking?: No 10. Has a relative or friend or a doctor or another health worker been concerned about your drinking or suggested you cut down?: No Alcohol Use Disorder Identification Test Final Score (AUDIT): 0 Brief Intervention: AUDIT score less than 7 or less-screening does not suggest unhealthy drinking-brief intervention not indicated Substance Abuse History in the last 12 months:  Yes.   Consequences of Substance Abuse: NA Previous Psychotropic Medications: Yes  Psychological Evaluations: Yes  Past Medical History:  Past Medical History:  Diagnosis Date  . Depression   . Herniated disc    x3  . Hypertension   . Mental disorder     Past Surgical History:  Procedure Laterality Date  . SHOULDER SURGERY     Family History: History reviewed. No pertinent family history. Family Psychiatric  History: none Tobacco Screening: Have you used any form of tobacco in the last 30 days? (Cigarettes, Smokeless Tobacco, Cigars, and/or Pipes): No Social History:  History  Alcohol Use No     History  Drug Use No    Additional Social History:                           Allergies:  No Known Allergies Lab Results: No results found for this or any previous visit (from the past 48 hour(s)).  Blood Alcohol level:  Lab Results  Component Value Date   ETH <5 07/18/2016   ETH <5 07/14/2016    Metabolic Disorder Labs:  Lab Results  Component Value Date   HGBA1C 5.2 07/15/2016   MPG 103 07/15/2016   No results found for: PROLACTIN Lab Results  Component Value Date   CHOL 187 07/15/2016   TRIG 86 07/15/2016   HDL 30 (L) 07/15/2016   CHOLHDL 6.2 07/15/2016   VLDL 17 07/15/2016   LDLCALC 140 (H) 07/15/2016    Current Medications: Current Facility-Administered Medications  Medication Dose Route Frequency Provider Last Rate Last Dose  . acetaminophen (TYLENOL)  tablet 650 mg  650 mg Oral Q6H PRN Audery Amel, MD      . alum & mag hydroxide-simeth (MAALOX/MYLANTA) 200-200-20 MG/5ML suspension 30 mL  30 mL Oral Q4H PRN Audery Amel, MD      . ARIPiprazole (ABILIFY) tablet 5 mg  5 mg Oral Daily Melainie Krinsky T Izadora Roehr, MD      . fluvoxaMINE (LUVOX) tablet 100 mg  100 mg Oral QHS Audery Amel, MD      . LORazepam (ATIVAN) injection 2 mg  2 mg Intramuscular Q4H PRN Jimmy Footman, MD       Or  . LORazepam (ATIVAN) tablet 2 mg  2 mg Oral Q4H PRN Jimmy Footman, MD   2 mg at 07/21/16 1647  . magnesium hydroxide (MILK OF MAGNESIA) suspension 30 mL  30 mL Oral Daily PRN Audery Amel, MD      . traZODone (DESYREL) tablet 100 mg  100 mg Oral QHS PRN Audery Amel, MD       PTA Medications: No prescriptions prior to admission.    Musculoskeletal: Strength & Muscle Tone: within normal limits Gait & Station: normal Patient  leans: N/A  Psychiatric Specialty Exam: Physical Exam  Nursing note and vitals reviewed. Constitutional: He appears well-developed and well-nourished.  HENT:  Head: Normocephalic and atraumatic.  Eyes: Conjunctivae are normal. Pupils are equal, round, and reactive to light.  Neck: Normal range of motion.  Cardiovascular: Regular rhythm and normal heart sounds.   Respiratory: No respiratory distress.  GI: Soft.  Musculoskeletal: Normal range of motion.  Neurological: He is alert.  Skin: Skin is warm and dry.  Psychiatric: Judgment normal. His mood appears anxious. His affect is blunt. His speech is delayed. He is slowed. Thought content is not paranoid. He expresses no homicidal and no suicidal ideation. He exhibits abnormal recent memory.    Review of Systems  Constitutional: Negative.   HENT: Negative.   Eyes: Negative.   Respiratory: Negative.   Cardiovascular: Negative.   Gastrointestinal: Negative.   Musculoskeletal: Negative.   Skin: Negative.   Neurological: Negative.   Psychiatric/Behavioral:  Positive for memory loss. Negative for depression, hallucinations, substance abuse and suicidal ideas. The patient is nervous/anxious and has insomnia.     Blood pressure 111/72, pulse 76, temperature 98.1 F (36.7 C), temperature source Oral, resp. rate 16, height 5\' 9"  (1.753 m), weight 79.8 kg (176 lb), SpO2 98 %.Body mass index is 25.99 kg/m.  General Appearance: Casual  Eye Contact:  Good  Speech:  Normal Rate  Volume:  Decreased  Mood:  Anxious  Affect:  Constricted  Thought Process:  Goal Directed  Orientation:  Full (Time, Place, and Person)  Thought Content:  Logical  Suicidal Thoughts:  No  Homicidal Thoughts:  No  Memory:  Immediate;   Good Recent;   Fair Remote;   Fair  Judgement:  Fair  Insight:  Fair  Psychomotor Activity:  Decreased  Concentration:  Concentration: Fair  Recall:  FiservFair  Fund of Knowledge:  Fair  Language:  Fair  Akathisia:  No  Handed:  Right  AIMS (if indicated):     Assets:  Communication Skills Desire for Improvement Housing Physical Health Resilience  ADL's:  Intact  Cognition:  WNL  Sleep:       Treatment Plan Summary: Daily contact with patient to assess and evaluate symptoms and progress in treatment, Medication management and Plan Patient is now admitted back to the unit. Still having some anxiety and depression but appears to be functioning better. No acute psychosis no acute suicidality. The indication for ECT is unclear at this point as he is not having extended catatonia and his depression is moderate and seems to respond to medication. Complicating the issue is that ECT will not be happening all of next week because of the Christmas holiday. My proposal to the patient is that we probably reevaluate tomorrow and consider discharge to outpatient treatment while restarting the Luvox and Abilify that had previously been prescribed and continuing the doses of Ativan as needed  Observation Level/Precautions:  15 minute checks   Laboratory:  HCG  Psychotherapy:    Medications:    Consultations:    Discharge Concerns:    Estimated LOS:  Other:     Physician Treatment Plan for Primary Diagnosis: MDD (major depressive disorder) Long Term Goal(s): Improvement in symptoms so as ready for discharge  Short Term Goals: Ability to verbalize feelings will improve and Ability to demonstrate self-control will improve  Physician Treatment Plan for Secondary Diagnosis: Principal Problem:   MDD (major depressive disorder) Active Problems:   Severe recurrent major depression with psychotic features (HCC)  Long Term  Goal(s): Improvement in symptoms so as ready for discharge  Short Term Goals: Ability to maintain clinical measurements within normal limits will improve  I certify that inpatient services furnished can reasonably be expected to improve the patient's condition.    Mordecai RasmussenJohn Zoey Bidwell, MD 12/18/20176:39 PM

## 2016-07-21 NOTE — ED Notes (Signed)
Marylu LundJanet RN called to take report. Pt has signed Voluntary Consent form. Belongings will be sent with patient.  No concerns  voiced. Maintained on 15 minute checks and observation by security camera for safety.

## 2016-07-21 NOTE — [facility] Counselor (Signed)
Adult Comprehensive Assessment  Patient ID: Benjamin [name], male   DOB: 11-Mar-1981, 35 y.o.   MRN: [id number]  Information Source: Information source: Patient  Current Stressors:  Educational / Learning stressors: No stressors identified  Employment / Job issues: No stressors identified  Family Relationships: No stressors identified  Surveyor, quantityinancial / Lack of resources (include bankruptcy): No stressors identified  Housing / Lack of housing: No stressors identified  Physical health (include injuries & life threatening diseases): No stressors identified  Social relationships: No stressors identified  Substance abuse: Past history of opiate use and alcohol Bereavement / Loss: No stressors identified   Living/Environment/Situation:  Living Arrangements: Alone Living conditions (as described by patient or guardian): They are good  How long has patient lived in current situation?: A little over 3 years  What is atmosphere in current home: Loving, Supportive  Family History:  Marital status: Single What is your sexual orientation?: Heterosexual  Does patient have children?: Yes How many children?: 2 How is patient's relationship with their children?: 37 year old and 37 year old - good relationship with kids   Childhood History:  By whom was/is the patient raised?: Mother Description of patient's relationship with caregiver when they were a child: It was a good relationship  Patient's description of current relationship with people who raised him/her: Relationship is "okay" How were you disciplined when you got in trouble as a child/adolescent?: Timeout  Does patient have siblings?: Yes Number of Siblings: 1 Description of patient's current relationship with siblings: Has a good relationship with brother  Did patient suffer any verbal/emotional/physical/sexual abuse as a child?: No Did patient suffer from severe childhood neglect?: No Has patient ever been sexually  abused/assaulted/raped as an adolescent or adult?: No Was the patient ever a victim of a crime or a disaster?: No Witnessed domestic violence?: No Has patient been effected by domestic violence as an adult?: No  Education:  Highest grade of school patient has completed: 4 year college  Currently a student?: No Name of school: NA Learning disability?: No  Employment/Work Situation:   Employment situation: Unemployed Patient's job has been impacted by current illness: No What is the longest time patient has a held a job?: 5 years  Where was the patient employed at that time?: [employer] - [employer]  Has patient ever been in the Eli Lilly and Companymilitary?: No Has patient ever served in combat?: No Did You Receive Any Psychiatric Treatment/Services While in Equities traderthe Military?: No Are There Guns or Other Weapons in Your Home?: No Are These ComptrollerWeapons Safely Secured?:  (N/A)  Financial Resources:   Financial resources: No income Does patient have a Lawyerrepresentative payee or guardian?: No  Alcohol/Substance Abuse:   What has been your use of drugs/alcohol within the last 12 months?: None reported - alcohol abuse and opiate in the past  If attempted suicide, did drugs/alcohol play a role in this?: No Alcohol/Substance Abuse Treatment Hx: Past Tx, Inpatient If yes, describe [facility]  Has alcohol/substance abuse ever caused legal problems?: Yes ([place] )  Social Support System:   Patient's Community Support System: Fair Museum/gallery exhibitions officerDescribe Community Support System: Does not have much community support in [place]  Type of faith/religion: Methodist/Christianity  How does patient's faith help to cope with current illness?: Reading, meditation   Leisure/Recreation:   Leisure and Hobbies: Museum/gallery exhibitions officerHiking, outdoor activities, watching movies   Strengths/Needs:   What things does the patient do well?: Good leadership qualities  In what areas does patient struggle / problems for patient: Bringing  out  character/ sense of humor   Discharge Plan:   Does patient have access to transportation?: Yes Will patient be returning to same living situation after discharge?: Yes Currently receiving community mental health services: No If no, would patient like referral for services when discharged?: Yes (What county?) ([place] )  Summary/Recommendations:   Summary and Recommendations (to be completed by the evaluator): Patient presented to the hospital under IVC and was admitted for psychotic depression.  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  Pt reports primary triggers for admission were conflict in family dynamics.  Pt reports her stressors are lack of assistance with children and recent loss of employment.  Pt now denies SI/HI/AVH.  [place], [place]  Pt lists supports in the community as his brother and two children.  Patient will benefit from crisis stabilization, medication evaluation, group therapy, and psycho education in addition to case management for discharge planning. Patient and CSW reviewed pt's identified goals and treatment plan. Pt verbalized understanding and agreed to treatment plan.  At discharge it is recommended that patient remain compliant with established plan and continue treatment.   Hampton AbbotKadijah Edina Winningham, MSW, LCSW-A 07/21/2016, 1:34PM

## 2016-07-21 NOTE — ED Notes (Signed)
Pt denies SI. Pt expressed desire to be moved to BMU. "I came back voluntarily because I was told I would have a bed."  Pt calm, but is frustrated with extended stay in ER. RN told pt she would keep pt updated. Pt accepting. No further concerns voiced. Maintained on 15 minute checks and observation by security camera for safety.

## 2016-07-21 NOTE — Tx Team (Signed)
Initial Treatment Plan 07/21/2016 1:14 PM Benjamin [name] Benjamin [name]    PATIENT STRESSORS: Legal issue Marital or family conflict   PATIENT STRENGTHS: Ability for insight Average or above average intelligence Capable of independent living   PATIENT IDENTIFIED PROBLEMS: Major Depressive Disorder  Catatonia                   DISCHARGE CRITERIA:  Improved stabilization in mood, thinking, and/or behavior  PRELIMINARY DISCHARGE PLAN: Outpatient therapy  PATIENT/FAMILY INVOLVEMENT: This treatment plan has been presented to and reviewed with the patient, Benjamin [name], and/or family member, .  The patient and family have been given the opportunity to ask questions and make suggestions.  Benjamin [name], Benjamin Grounds, RN 07/21/2016, 1:14 PM

## 2016-07-22 DIAGNOSIS — F332 Major depressive disorder, recurrent severe without psychotic features: Secondary | ICD-10-CM

## 2016-07-22 MED ORDER — ENSURE ENLIVE PO LIQD
237.0000 mL | Freq: Two times a day (BID) | ORAL | Status: DC
  Administered 2016-07-22 – 2016-07-26 (×4): 237 mL via ORAL

## 2016-07-22 NOTE — [facility] Group Notes (Signed)
[facility] LCSW Group Therapy   07/22/2016 1pm   Type of Therapy: Group Therapy   Participation Level: Active   Participation Quality: Attentive, Sharing and Supportive   Affect: Appropriate  Cognitive: Alert and Oriented   Insight: Developing/Improving and Engaged   Engagement in Therapy: Developing/Improving and Engaged   Modes of Intervention: Clarification, Confrontation, Discussion, Education, Exploration,  Limit-setting, Orientation, Problem-solving, Rapport Building, Dance movement psychotherapisteality Testing, Socialization and Support  Summary of Progress/Problems: The topic for group therapy was feelings about diagnosis. Pt actively participated in group discussion on their past and current diagnosis and how they feel towards this. Pt also identified how society and family members judge them, based on their diagnosis as well as stereotypes and stigmas. Pt initially commented he had not been diagnosed to his knowledge since his admission and then got up and left the session citing a need to change his clothes.  Pt was otherwise polite and cooperative with the CSW and other group members.     Dorothe PeaJonathan F. Steph Cheadle, LCSWA, LCAS

## 2016-07-22 NOTE — Progress Notes (Signed)
Nutrition Brief Note  Patient identified on the Malnutrition Screening Tool (MST) Report  Wt Readings from Last 15 Encounters:  07/21/16 176 lb (79.8 kg)  07/18/16 180 lb (81.6 kg)  09/20/12 145 lb (65.8 kg)  12/14/11 179 lb (81.2 kg)    Body mass index is 25.99 kg/m. Patient meets criteria for overweight based on current BMI.   Current diet order is regular, patient is consuming approximately 100% of meals at this time. Labs and medications reviewed.   No nutrition interventions warranted at this time. If nutrition issues arise, please consult RD.   Dionne AnoWilliam M. Tarek Cravens, MS, RD LDN Inpatient Clinical Dietitian Pager [phone]

## 2016-07-22 NOTE — Progress Notes (Signed)
Patient has been isolative in his room most of the day except for meals. Is medication compliant. Has minimal interaction. Did not attend any groups. Did not go outside with other patients as well today. Requested Ativan twice today. Did not want to talk about why he is here. Remains on q15 minute checks for safety. Will continue to monitor.

## 2016-07-22 NOTE — Plan of Care (Signed)
Problem: Education: Goal: Knowledge of the prescribed therapeutic regimen will improve Outcome: Progressing Patient is knowledgeable of all prescribed medications ans what they are prescribed for.

## 2016-07-22 NOTE — [facility] Group Notes (Signed)
Goals Group  Date/Time: 07/22/2016, 9:00AM Type of Therapy and Topic: Group Therapy: Goals Group: SMART Goals  ?  Participation Level: Moderate  ?  Description of Group:  ?  The purpose of a daily goals group is to assist and guide patients in setting recovery/wellness-related goals. The objective is to set goals as they relate to the crisis in which they were admitted. Patients will be using SMART goal modalities to set measurable goals. Characteristics of realistic goals will be discussed and patients will be assisted in setting and processing how one will reach their goal. Facilitator will also assist patients in applying interventions and coping skills learned in psycho-education groups to the SMART goal and process how one will achieve defined goal.  ?  Therapeutic Goals:  ?  -Patients will develop and document one goal related to or their crisis in which brought them into treatment.  -Patients will be guided by LCSW using SMART goal setting modality in how to set a measurable, attainable, realistic and time sensitive goal.  -Patients will process barriers in reaching goal.  -Patients will process interventions in how to overcome and successful in reaching goal.  ?  Patient's Goal: Patient goal for today is to go to all groups. Pt stated to accomplish this goal, he will meet with stay attentive.  ?  Therapeutic Modalities:  Motivational Interviewing  Cognitive Behavioral Therapy  Crisis Intervention Model  SMART goals setting  Hampton AbbotKadijah Yaslin Kirtley, MSW, LCSW-A 07/22/2016, 10:32AM

## 2016-07-22 NOTE — Progress Notes (Signed)
[facility] MD Progress Note  07/22/2016 3:36 PM Benjamin [name]  MRN:  [id number] Subjective:  Patient interviewed today. Treatment team met as well. Patient states that he has not had a return of catatonia as far as he knows but he remains very nervous and at times feels almost overwhelmed especially when it attending groups. Mood stays depressed much of the time. Not suicidal but very withdrawn and not functioning at a very high level seems to have some degree of cognitive impairment. Principal Problem: MDD (major depressive disorder) Diagnosis:   Patient Active Problem List   Diagnosis Date Noted  . Severe recurrent major depression with psychotic features ([place]) [F33.3] 07/21/2016  . MDD (major depressive disorder) [F32.9] 07/21/2016  . PTSD (post-traumatic stress disorder) [F43.10] 07/15/2016  . OCD (obsessive compulsive disorder) [F42.9] 07/15/2016  . 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 Major depressive disorder, recurrent episode, severe with catatonia ([place]) [F33.2, F06.1] 07/09/2016  . 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 Bradycardia [R00.1] 09/18/2012  . Decreased oral intake [R63.8] 09/17/2012  . Psychosis [F29] 09/09/2012  . Severe episode of recurrent major depressive disorder, with psychotic features ([place]) [F33.3] 09/08/2012  . History of substance abuse [Z87.898] 09/08/2012   Total Time spent with patient: 30 minutes  Past Psychiatric History: Patient has a history of recent return of what appears to be severe depression with intermittent catatonia.  Past Medical History:  Past Medical History:  Diagnosis Date  . Depression   . Herniated disc    x3  . Hypertension   . Mental disorder     Past Surgical History:  Procedure Laterality Date  . SHOULDER SURGERY     Family History: History reviewed. No pertinent family history. Family Psychiatric  History: No significant family history Social History:  History  Alcohol Use No     History  Drug Use No     Social History   Social History  . Marital status: Single    Spouse name: N/A  . Number of children: N/A  . Years of education: N/A   Social History Main Topics  . Smoking status: Former Smoker    Packs/day: 1.00    Types: Cigarettes    Quit date: 04/04/2012  . Smokeless tobacco: Never Used  . Alcohol use No  . Drug use: No  . Sexual activity: No   Other Topics Concern  . None   Social History Narrative  . None   Additional Social History:                         Sleep: Fair  Appetite:  Good  Current Medications: Current Facility-Administered Medications  Medication Dose Route Frequency Provider Last Rate Last Dose  . acetaminophen (TYLENOL) tablet 650 mg  650 mg Oral Q6H PRN Gonzella Lex, MD   650 mg at 07/21/16 2135  . alum & mag hydroxide-simeth (MAALOX/MYLANTA) 200-200-20 MG/5ML suspension 30 mL  30 mL Oral Q4H PRN Gonzella Lex, MD      . ARIPiprazole (ABILIFY) tablet 5 mg  5 mg Oral Daily Gonzella Lex, MD   5 mg at 07/22/16 0813  . feeding supplement (ENSURE ENLIVE) (ENSURE ENLIVE) liquid 237 mL  237 mL Oral BID BM Gonzella Lex, MD   237 mL at 07/22/16 1400  . fluvoxaMINE (LUVOX) tablet 100 mg  100 mg Oral QHS Gonzella Lex, MD   100 mg at 07/21/16 2123  . LORazepam (ATIVAN) injection 2 mg  2  mg Intramuscular Q4H PRN Hildred Priest, MD       Or  . LORazepam (ATIVAN) tablet 2 mg  2 mg Oral Q4H PRN Hildred Priest, MD   2 mg at 07/22/16 1215  . magnesium hydroxide (MILK OF MAGNESIA) suspension 30 mL  30 mL Oral Daily PRN Gonzella Lex, MD      . traZODone (DESYREL) tablet 100 mg  100 mg Oral QHS PRN Gonzella Lex, MD   100 mg at 07/21/16 2124    Lab Results: No results found for this or any previous visit (from the past 48 hour(s)).  Blood Alcohol level:  Lab Results  Component Value Date   ETH <5 07/18/2016   ETH <5 66/44/0347    Metabolic Disorder Labs: Lab Results  Component Value Date   HGBA1C 5.2 07/15/2016    MPG 103 07/15/2016   No results found for: PROLACTIN Lab Results  Component Value Date   CHOL 187 07/15/2016   TRIG 86 07/15/2016   HDL 30 (L) 07/15/2016   CHOLHDL 6.2 07/15/2016   VLDL 17 07/15/2016   LDLCALC 140 (H) 07/15/2016    Physical Findings: AIMS: Facial and Oral Movements Muscles of Facial Expression: None, normal Lips and Perioral Area: None, normal Jaw: None, normal Tongue: None, normal,Extremity Movements Upper (arms, wrists, hands, fingers): None, normal Lower (legs, knees, ankles, toes): None, normal, Trunk Movements Neck, shoulders, hips: None, normal, Overall Severity Severity of abnormal movements (highest score from questions above): None, normal Incapacitation due to abnormal movements: None, normal Patient's awareness of abnormal movements (rate only patient's report): No Awareness, Dental Status Current problems with teeth and/or dentures?: No Does patient usually wear dentures?: No  CIWA:    COWS:     Musculoskeletal: Strength & Muscle Tone: within normal limits Gait & Station: normal Patient leans: N/A  Psychiatric Specialty Exam: Physical Exam  Nursing note and vitals reviewed. Constitutional: He appears well-developed and well-nourished.  HENT:  Head: Normocephalic and atraumatic.  Eyes: Conjunctivae are normal. Pupils are equal, round, and reactive to light.  Neck: Normal range of motion.  Cardiovascular: Regular rhythm and normal heart sounds.   Respiratory: Effort normal. No respiratory distress.  GI: Soft.  Musculoskeletal: Normal range of motion.  Neurological: He is alert.  Skin: Skin is warm and dry.  Psychiatric: Judgment normal. His mood appears anxious. His speech is delayed. He is slowed. Thought content is not paranoid. Cognition and memory are impaired. He exhibits a depressed mood. He expresses no homicidal and no suicidal ideation.    Review of Systems  Constitutional: Negative.   HENT: Negative.   Eyes: Negative.    Respiratory: Negative.   Cardiovascular: Negative.   Gastrointestinal: Negative.   Musculoskeletal: Negative.   Skin: Negative.   Neurological: Negative.   Psychiatric/Behavioral: Positive for depression and memory loss. Negative for hallucinations, substance abuse and suicidal ideas. The patient is nervous/anxious and has insomnia.     Blood pressure 100/63, pulse 66, temperature 97.8 F (36.6 C), temperature source Oral, resp. rate 16, height 5' 9"  (1.753 m), weight 79.8 kg (176 lb), SpO2 98 %.Body mass index is 25.99 kg/m.  General Appearance: Casual  Eye Contact:  Fair  Speech:  Slow  Volume:  Decreased  Mood:  Anxious and Dysphoric  Affect:  Congruent  Thought Process:  Goal Directed  Orientation:  Full (Time, Place, and Person)  Thought Content:  Logical  Suicidal Thoughts:  No  Homicidal Thoughts:  No  Memory:  Immediate;   Good  Recent;   Fair Remote;   Fair  Judgement:  Fair  Insight:  Fair  Psychomotor Activity:  Decreased  Concentration:  Concentration: Fair  Recall:  [employer] of Knowledge:  Fair  Language:  Fair  Akathisia:  No  Handed:  Right  AIMS (if indicated):     Assets:  Desire for Improvement Housing Physical Health Resilience Social Support  ADL's:  Intact  Cognition:  WNL  Sleep:  Number of Hours: 8     Treatment Plan Summary: Daily contact with patient to assess and evaluate symptoms and progress in treatment, Medication management and Plan Patient appears to still be having some significant symptoms of depression and anxiety. Slowly recovering but not yet back to baseline. Still at risk of return of catatonia. Still requiring intermittent Ativan. I have seen him several times and although he has not in the last couple days appeared to be catatonic again I am concerned that he stays in bed a lot of the time. Patient encouraged to attend groups get out of bed and move around. Continue current antidepressant and antipsychotic medication.  Anticipate possible length of stay 3-5 days. No other medical change right now.  Alethia Berthold, MD 07/22/2016, 3:36 PM

## 2016-07-22 NOTE — Progress Notes (Signed)
Recreation Therapy Notes  Date: 12.19.17 Time: 3:00 pm Location: Craft Room  Group Topic: Self-expression  Goal Area(s) Addresses:  Patient will be able to identify a color that represents each emotion. Patient will verbalize benefit of using art as a means of self-expression. Patient will verbalize one emotion experienced while participating in activity.  Behavioral Response: Attentive, Interactive  Intervention: The Colors Within Me  Activity: Patients were given a blank face worksheet and were instructed to pick a color for each emotion they were feeling and to show on the worksheet how much of that emotion they were feeling.  Education: LRT educated patients on other forms of self-expression.  Education Outcome: Acknowledges education/In group clarification offered  Clinical Observations/Feedback: Patient picked colors for each emotion he was feeling and showed on the worksheet how much of that emotion he was feeling. Patient contributed to group discussion by stating what emotions he was feeling, how his emotions affect his treatment in the hospital, that his emotions are dynamic, how he sees his emotions changing once he is d/c, that it was helpful to see his emotions on paper and why, and what he can do to maintain being positive post d/c.  Jacquelynn CreeGreene,Benjamin [name], LRT/CTRS 07/22/2016 4:10 PM

## 2016-07-22 NOTE — [facility] Suicide Risk Assessment (Signed)
CSW attempted to contact brother,  Jacqualine Maundy Plate @ [phone] - no answer at this time. CSW left a voicemail and will attemp to contact brother at a later time.    Hampton AbbotKadijah Jarrid Lienhard, MSW, LCSW-A 07/22/2016, 4:23PM

## 2016-07-22 NOTE — [facility] Group Notes (Signed)
[facility] Group Notes:  (Nursing/MHT/Case Management/Adjunct)  Date:  07/22/2016  Time:  1:44 AM  Type of Therapy:  Group Therapy  Participation Level:  Active  Participation Quality:  Attentive  Affect:  Flat  Cognitive:  Alert  Insight:  Appropriate  Engagement in Group:  Engaged  Modes of Intervention:  n/a  Summary of Progress/Problems:  Benjamin [name] 07/22/2016, 1:44 AM

## 2016-07-22 NOTE — Plan of Care (Signed)
Problem: Coping: Goal: Ability to verbalize frustrations and anger appropriately will improve Outcome: Progressing Patient verbalized his frustrations about being in the ED for three days.

## 2016-07-22 NOTE — Progress Notes (Signed)
D: Patient flat but brightens on approach. Denies SI/HI/AVH. States he was frustrated that he had to stay in the ED for three days. He's been visible in the milieu and interacting with staff.  A: Medication given with education. Encouragement provided.  R: Patient compliant with medication. He has remained calm and cooperative. Safety maintained with 15 min checks.

## 2016-07-22 NOTE — Progress Notes (Signed)
Recreation Therapy Notes  INPATIENT RECREATION THERAPY ASSESSMENT  Patient Details Name: Benjamin HensenJoshua [name] MRN: [id number] DOB: [birth year] Today's Date: 07/22/2016  Patient Stressors: Other (Comment) (Has to go to court over custody of his children - children normally live with pt - they are with a friend while pt is in the hospital and the children's mother does not get to see the children)  Coping Skills:   Arguments, Art/Dance, Exercise, Talking, Music, Sports, Other (Comment) (Taking care of his kids)  Personal Challenges: Communication, Concentration, Expressing Yourself, Relationships, Stress Management, Trusting Others  Leisure Interests (2+):  Individual - Other (Comment) (Explore, activities with children)  Awareness of Community Resources:  Yes  Community Resources:  Park  Current Use: Yes  If no, Barriers?:    Patient Strengths:  Determination, example he sets for his children  Patient Identified Areas of Improvement:  Improve the way othes see him  Current Recreation Participation:  Reading, traveling, visiting friends, went to a football game  Patient Goal for Hospitalization:  To get better  [place]  [place]idence[place]   Current SI (including self-harm):  No  Current HI:  No  Consent to Intern Participation: N/A   Jacquelynn CreeGreene,Pawel Soules M, LRT/CTRS 07/22/2016, 1:32 PM

## 2016-07-23 NOTE — Progress Notes (Signed)
[facility] MD Progress Note  07/23/2016 5:37 PM Benjamin [name]  MRN:  [id number] Subjective:   Follow-up for Wednesday the 20th. Patient seen. Reports he is feeling better today. No return of any catatonia. Going to groups. Patient appears more lucid and on the ball and interactive. No suicidality. Principal Problem: MDD (major depressive disorder) Diagnosis:   Patient Active Problem List   Diagnosis Date Noted  . Severe recurrent major depression with psychotic features ([facility]) [F33.3] 07/21/2016  . MDD (major depressive disorder) [F32.9] 07/21/2016  . PTSD (post-traumatic stress disorder) [F43.10] 07/15/2016  . OCD (obsessive compulsive disorder) [F42.9] 07/15/2016  . 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 Bradycardia [R00.1] 09/18/2012  . Decreased oral intake [R63.8] 09/17/2012  . Psychosis [F29] 09/09/2012  . Severe episode of recurrent major depressive disorder, with psychotic features ([facility]) [F33.3] 09/08/2012  . History of substance abuse [Z87.898] 09/08/2012   Total Time spent with patient: 30 minutes  Past Psychiatric History: Patient has a history of recent return of what appears to be severe depression with intermittent catatonia.  Past Medical History:  Past Medical History:  Diagnosis Date  . Depression   . Herniated disc    x3  . Hypertension   . Mental disorder     Past Surgical History:  Procedure Laterality Date  . SHOULDER SURGERY     Family History: History reviewed. No pertinent family history. Family Psychiatric  History: No significant family history Social History:  History  Alcohol Use No     History  Drug Use No    Social History   Social History  . Marital status: Single    Spouse name: N/A  . Number of children: N/A  . Years of education: N/A   Social History Main Topics  . Smoking status: Former  Smoker    Packs/day: 1.00    Types: Cigarettes    Quit date: 04/04/2012  . Smokeless tobacco: Never Used  . Alcohol use No  . Drug use: No  . Sexual activity: No   Other Topics Concern  . None   Social History Narrative  . None   Additional Social History:                         Sleep: Fair  Appetite:  Good  Current Medications: Current Facility-Administered Medications  Medication Dose Route Frequency Provider Last Rate Last Dose  . acetaminophen (TYLENOL) tablet 650 mg  650 mg Oral Q6H PRN Audery Amel, MD   650 mg at 07/21/16 2135  . alum & mag hydroxide-simeth (MAALOX/MYLANTA) 200-200-20 MG/5ML suspension 30 mL  30 mL Oral Q4H PRN Audery Amel, MD      . ARIPiprazole (ABILIFY) tablet 5 mg  5 mg Oral Daily Audery Amel, MD   5 mg at 07/23/16 0834  . feeding supplement (ENSURE ENLIVE) (ENSURE ENLIVE) liquid 237 mL  237 mL Oral BID BM Audery Amel, MD   237 mL at 07/23/16 1400  . fluvoxaMINE (LUVOX) tablet 100 mg  100 mg Oral QHS Audery Amel, MD   100 mg at 07/22/16 2103  . LORazepam (ATIVAN) injection 2 mg  2 mg Intramuscular Q4H PRN Jimmy Footman, MD       Or  . LORazepam (ATIVAN) tablet 2 mg  2 mg Oral Q4H PRN Jimmy Footman, MD   2 mg  at 07/22/16 2103  . magnesium hydroxide (MILK OF MAGNESIA) suspension 30 mL  30 mL Oral Daily PRN Audery AmelJohn T Clapacs, MD      . traZODone (DESYREL) tablet 100 mg  100 mg Oral QHS PRN Audery AmelJohn T Clapacs, MD   100 mg at 07/22/16 2105    Lab Results: No results found for this or any previous visit (from the past 48 hour(s)).  Blood Alcohol level:  Lab Results  Component Value Date   ETH <5 07/18/2016   ETH <5 07/14/2016    Metabolic Disorder Labs: Lab Results  Component Value Date   HGBA1C 5.2 07/15/2016   MPG 103 07/15/2016   No results found for: PROLACTIN Lab Results  Component Value Date   CHOL 187 07/15/2016   TRIG 86 07/15/2016   HDL 30 (L) 07/15/2016   CHOLHDL 6.2 07/15/2016   VLDL 17  07/15/2016   LDLCALC 140 (H) 07/15/2016    Physical Findings: AIMS: Facial and Oral Movements Muscles of Facial Expression: None, normal Lips and Perioral Area: None, normal Jaw: None, normal Tongue: None, normal,Extremity Movements Upper (arms, wrists, hands, fingers): None, normal Lower (legs, knees, ankles, toes): None, normal, Trunk Movements Neck, shoulders, hips: None, normal, Overall Severity Severity of abnormal movements (highest score from questions above): None, normal Incapacitation due to abnormal movements: None, normal Patient's awareness of abnormal movements (rate only patient's report): No Awareness, Dental Status Current problems with teeth and/or dentures?: No Does patient usually wear dentures?: No  CIWA:    COWS:     Musculoskeletal: Strength & Muscle Tone: within normal limits Gait & Station: normal Patient leans: N/A  Psychiatric Specialty Exam: Physical Exam  Nursing note and vitals reviewed. Constitutional: He appears well-developed and well-nourished.  HENT:  Head: Normocephalic and atraumatic.  Eyes: Conjunctivae are normal. Pupils are equal, round, and reactive to light.  Neck: Normal range of motion.  Cardiovascular: Regular rhythm and normal heart sounds.   Respiratory: Effort normal. No respiratory distress.  GI: Soft.  Musculoskeletal: Normal range of motion.  Neurological: He is alert.  Skin: Skin is warm and dry.  Psychiatric: Judgment normal. His mood appears anxious. His speech is not delayed. He is slowed. Thought content is not paranoid. Cognition and memory are impaired. He does not exhibit a depressed mood. He expresses no homicidal and no suicidal ideation.    Review of Systems  Constitutional: Negative.   HENT: Negative.   Eyes: Negative.   Respiratory: Negative.   Cardiovascular: Negative.   Gastrointestinal: Negative.   Musculoskeletal: Negative.   Skin: Negative.   Neurological: Negative.   Psychiatric/Behavioral:  Positive for depression and memory loss. Negative for hallucinations, substance abuse and suicidal ideas. The patient is nervous/anxious and has insomnia.     Blood pressure 107/68, pulse 68, temperature 98.1 F (36.7 C), resp. rate 16, height 5\' 9"  (1.753 m), weight 79.8 kg (176 lb), SpO2 98 %.Body mass index is 25.99 kg/m.  General Appearance: Casual  Eye Contact:  Fair  Speech:  Slow  Volume:  Decreased  Mood:  Anxious and Dysphoric  Affect:  Congruent  Thought Process:  Goal Directed  Orientation:  Full (Time, Place, and Person)  Thought Content:  Logical  Suicidal Thoughts:  No  Homicidal Thoughts:  No  Memory:  Immediate;   Good Recent;   Fair Remote;   Fair  Judgement:  Fair  Insight:  Fair  Psychomotor Activity:  Decreased  Concentration:  Concentration: Fair  Recall:  [employer]  Fund of Knowledge:  Fair  Language:  Fair  Akathisia:  No  Handed:  Right  AIMS (if indicated):     Assets:  Desire for Improvement Housing Physical Health Resilience Social Support  ADL's:  Intact  Cognition:  WNL  Sleep:  Number of Hours: 7.5     Treatment Plan Summary: Daily contact with patient to assess and evaluate symptoms and progress in treatment, Medication management and Plan No change to antidepressant and antipsychotic and antianxiety medicine. Continue current treatment plan. No change otherwise. Encourage patient to attend groups. We are talking about possible discharge Friday if he remains stable.  Mordecai RasmussenJohn Clapacs, MD 07/23/2016, 5:37 PM

## 2016-07-23 NOTE — Plan of Care (Signed)
Problem: Safety: Goal: Ability to remain free from injury will improve Outcome: Progressing No self injury reported or observed   

## 2016-07-23 NOTE — Progress Notes (Signed)
Recreation Therapy Notes  Date: 12.20.17 Time: 1:00 pm Location: Craft Room  Group Topic: Self-esteem  Goal Area(s) Addresses:  Patient will write at least one positive trait. Patient will verbalize benefit of having healthy self-esteem.  Behavioral Response: Attentive, Interactive  Intervention: I Am  Activity: Patients were given a worksheet with the letter I on it and were instructed to write as many positive traits inside the letter.  Education: LRT educated patients on ways they can increase their self-esteem.  Education Outcome: Acknowledges education/In group clarification offered.   Clinical Observations/Feedback: Patient wrote positive traits about self. Patient contributed to group discussion by talking about why he thinks negative and ways he can increase his self-esteem.  Jacquelynn CreeGreene,Breawna Montenegro M, LRT/CTRS 07/23/2016 3:37 PM

## 2016-07-23 NOTE — Progress Notes (Signed)
Pt isolates to self and room. Minimal conversation offered. Minimal interaction with staff and peers. Denies SI, HI, AVH.  Encouragement and support offered. Medications given as prescribed. Safety checks maintained.  Pt continues to isolate to room. Did attend group. Remains safe on unit with q 15 min checks.

## 2016-07-23 NOTE — Plan of Care (Signed)
Problem: [facility] Participation in Recreation Therapeutic Interventions Goal: STG-Other Recreation Therapy Goal (Specify) STG: Self-expression - Within 4 treatment sessions, patient will verbalize understanding of different ways to express himself in each of 2 treatment sessions to increase knowledge of healthy ways to express himself post d/c.  Outcome: Progressing Treatment Session 1; Completed 1 out of 2: At approximately 3:00 pm, LRT met with patient in consultation room. LRT educated patient on healthy forms of self-expression. LRT provided patient with a list. Patient verbalized understanding. LRT encouraged patient to use healthy ways to express how he was feeling.  Leonette Monarch, LRT/CTRS 12.20.17 3:59 pm  Problem: [facility] Participation in Recreation Therapeutic Interventions Goal: STG-Other Recreation Therapy Goal (Specify) STG: Stress Management - Within 4 treatment sessions, patient will verbalize understanding of the stress management techniques in each of 2 treatment sessions to increase stress management skills post d/c.  Outcome: Progressing Treatment Session 1; Completed 1 out of 2: At approximately 3:00 pm, LRT met wit patient in consultation room. LRT educated and provided patient with handouts on stress management techniques. Patient verbalized understanding. LRT encouraged patient to read over and practice the stress management techniques.  Leonette Monarch, LRT/CTRS 12.20.17 4:00 pm

## 2016-07-23 NOTE — Plan of Care (Signed)
Problem: Safety: Goal: Periods of time without injury will increase Outcome: Progressing Patient has been without injury during this shift.    

## 2016-07-23 NOTE — [facility] Group Notes (Signed)
[facility] Group Notes:  (Nursing/MHT/Case Management/Adjunct)  Date:  07/23/2016  Time:  4:36 PM  Type of Therapy:  Psychoeducational Skills  Participation Level:  Minimal  Participation Quality:  Appropriate and Attentive  Affect:  Appropriate  Cognitive:  Appropriate  Insight:  Appropriate  Engagement in Group:  Engaged and Limited  Modes of Intervention:  Discussion and Education  Summary of Progress/Problems:  Benjamin [name] 07/23/2016, 4:36 PM

## 2016-07-23 NOTE — Progress Notes (Signed)
D: Patient appears somewhat irritated. Brought up the fact that he had to spend three days in the ED before coming to this floor. Also irritated that he did not have an ensure scheduled at HS but others did. Denies SI/HI/AVH. A: Medication given with education. Encouragement provided.  R: Patient was compliant with medication. Safety maintained with 15 min checks.

## 2016-07-24 MED ORDER — LORAZEPAM 2 MG/ML IJ SOLN
2.0000 mg | INTRAMUSCULAR | Status: DC | PRN
  Administered 2016-07-24: 2 mg via INTRAMUSCULAR
  Filled 2016-07-24: qty 1

## 2016-07-24 MED ORDER — LORAZEPAM 2 MG PO TABS
2.0000 mg | ORAL_TABLET | ORAL | Status: DC | PRN
  Administered 2016-07-24 – 2016-07-26 (×5): 2 mg via ORAL
  Filled 2016-07-24 (×5): qty 1

## 2016-07-24 NOTE — [facility] Group Notes (Deleted)
[facility] LCSW Group Therapy  07/24/2016 5:26 PM  Type of Therapy:  Group Therapy  Participation Level:  Active  Participation Quality:  Appropriate  Affect:  Appropriate  Cognitive:  Alert  Insight:  Developing/Improving  Engagement in Therapy:  Engaged  Modes of Intervention:  Activity, Discussion, Education and Support  Summary of Progress/Problems: Balance in life: Patients will discuss the concept of balance and how it looks and feels to be unbalanced. Pt will identify areas in their life that is unbalanced and ways to become more balanced. They discussed what aspects in their lives has influenced their self care. Patients also discussed self care in the areas of self regulation/control, hygiene/appearance, sleep/relaxation, healthy leisure, healthy eating habits, exercise, inner peace/spirituality, self improvement, sobriety, and health management. They were challenged to identify changes that are needed in order to improve self care.   Alveda Vanhorne G. Garnette CzechSampson MSW, LCSWA 07/24/2016, 5:28 PM

## 2016-07-24 NOTE — [facility] Group Notes (Signed)
Goals Group  Date/Time: 07/24/2016  9:00AM Type of Therapy and Topic: Group Therapy: Goals Group: SMART Goals  ?  Participation Level: Moderate  ?  Description of Group:  ?  The purpose of a daily goals group is to assist and guide patients in setting recovery/wellness-related goals. The objective is to set goals as they relate to the crisis in which they were admitted. Patients will be using SMART goal modalities to set measurable goals. Characteristics of realistic goals will be discussed and patients will be assisted in setting and processing how one will reach their goal. Facilitator will also assist patients in applying interventions and coping skills learned in psycho-education groups to the SMART goal and process how one will achieve defined goal.  ?  Therapeutic Goals:  ?  -Patients will develop and document one goal related to or their crisis in which brought them into treatment.  -Patients will be guided by LCSW using SMART goal setting modality in how to set a measurable, attainable, realistic and time sensitive goal.  -Patients will process barriers in reaching goal.  -Patients will process interventions in how to overcome and successful in reaching goal.  ?  Patient's Goal: Pt invited but did not attend. ?  Therapeutic Modalities:  Motivational Interviewing  Cognitive Behavioral Therapy  Crisis Intervention Model  SMART goals setting   Hampton AbbotKadijah Yanis Larin, MSW, LCSW-A 07/24/2016, 10:49AM

## 2016-07-24 NOTE — Progress Notes (Signed)
Patient ID: Benjamin HensenJoshua Rounsaville, male   DOB: 10/24/1980, 35 y.o.   MRN: [id number] Elective mutism, awake in bed, alert, refused to talk, stays in bed and refused bedtime medications despite encouragement.

## 2016-07-24 NOTE — Progress Notes (Signed)
Patient refuses to talk, opens eyes when stimulated, but does not respond. Ativan IM given with no effects. Pt continues to stay in bed. Fluids and nutrition offered. Pt refused.

## 2016-07-24 NOTE — Progress Notes (Signed)
Recreation Therapy Notes  Date: 12.21.17 Time: 1:00 pm Location: Craft Room  Group Topic: Leisure Education  Goal Area(s) Addresses:  Patient will identify activities for each letter of the alphabet. Patient will verbalize ability to integrate positive leisure into life post d/c. Patient will verbalize ability to use leisure as a Associate Professorcoping skill.  Behavioral Response: Did not attend  Intervention: Leisure Alphabet  Activity: Patients were given a Leisure Information systems managerAlphabet worksheet and were instructed to write healthy leisure activities for each letter of the alphabet.  Education: LRT educated patients on what they need to participate in leisure.  Education Outcome: Patient did not attend group.  Clinical Observations/Feedback: Patient did not attend group.  Jacquelynn CreeGreene,Ceonna Frazzini M, LRT/CTRS 07/24/2016 3:51 PM

## 2016-07-24 NOTE — Progress Notes (Signed)
Patient up talking and walking. Requesting ativan. Eating meals and drinking

## 2016-07-24 NOTE — Progress Notes (Signed)
[facility] MD Progress Note  07/24/2016 12:05 PM Benjamin [name]  MRN:  [id number] Subjective:  Follow-up for Thursday the 21st. Patient seen this morning. Overnight he has gone back into a catatonic state. Went to see him in his room. Eyes were partially open but he would not follow any commands. Did not show any sense of responding when I spoke with them. Looks dry. Seems like he probably hasn't eaten today. He has gotten some Ativan without any response yet. Unknown whether any kind of stressor came along because this. Reviewed situation with nursing. Principal Problem: MDD (major depressive disorder) Diagnosis:   Patient Active Problem List   Diagnosis Date Noted  . Severe recurrent major depression with psychotic features (HCC) [F33.3] 07/21/2016  . MDD (major depressive disorder) [F32.9] 07/21/2016  . PTSD (post-traumatic stress disorder) [F43.10] 07/15/2016  . OCD (obsessive compulsive disorder) [F42.9] 07/15/2016  . 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 Bradycardia [R00.1] 09/18/2012  . Decreased oral intake [R63.8] 09/17/2012  . Psychosis [F29] 09/09/2012  . Severe episode of recurrent major depressive disorder, with psychotic features (HCC) [F33.3] 09/08/2012  . History of substance abuse [Z87.898] 09/08/2012   Total Time spent with patient: 30 minutes  Past Psychiatric History: Patient has a history of recent return of what appears to be severe depression with intermittent catatonia.  Past Medical History:  Past Medical History:  Diagnosis Date  . Depression   . Herniated disc    x3  . Hypertension   . Mental disorder     Past Surgical History:  Procedure Laterality Date  . SHOULDER SURGERY     Family History: History reviewed. No pertinent family history. Family Psychiatric  History: No significant family history Social  History:  History  Alcohol Use No     History  Drug Use No    Social History   Social History  . Marital status: Single    Spouse name: N/A  . Number of children: N/A  . Years of education: N/A   Social History Main Topics  . Smoking status: Former Smoker    Packs/day: 1.00    Types: Cigarettes    Quit date: 04/04/2012  . Smokeless tobacco: Never Used  . Alcohol use No  . Drug use: No  . Sexual activity: No   Other Topics Concern  . None   Social History Narrative  . None   Additional Social History:                         Sleep: Fair  Appetite:  Good  Current Medications: Current Facility-Administered Medications  Medication Dose Route Frequency Provider Last Rate Last Dose  . acetaminophen (TYLENOL) tablet 650 mg  650 mg Oral Q6H PRN Audery Amel, MD   650 mg at 07/21/16 2135  . alum & mag hydroxide-simeth (MAALOX/MYLANTA) 200-200-20 MG/5ML suspension 30 mL  30 mL Oral Q4H PRN Audery Amel, MD      . ARIPiprazole (ABILIFY) tablet 5 mg  5 mg Oral Daily Audery Amel, MD   5 mg at 07/23/16 0834  . feeding supplement (ENSURE ENLIVE) (ENSURE ENLIVE) liquid 237 mL  237 mL Oral BID BM Audery Amel, MD   237 mL at 07/23/16 1400  . fluvoxaMINE (LUVOX) tablet 100 mg  100 mg Oral QHS Audery Amel, MD   100 mg  at 07/22/16 2103  . LORazepam (ATIVAN) injection 2 mg  2 mg Intramuscular Q2H PRN Audery AmelJohn T Clapacs, MD       Or  . LORazepam (ATIVAN) tablet 2 mg  2 mg Oral Q4H PRN Audery AmelJohn T Clapacs, MD      . magnesium hydroxide (MILK OF MAGNESIA) suspension 30 mL  30 mL Oral Daily PRN Audery AmelJohn T Clapacs, MD      . traZODone (DESYREL) tablet 100 mg  100 mg Oral QHS PRN Audery AmelJohn T Clapacs, MD   100 mg at 07/22/16 2105    Lab Results: No results found for this or any previous visit (from the past 48 hour(s)).  Blood Alcohol level:  Lab Results  Component Value Date   ETH <5 07/18/2016   ETH <5 07/14/2016    Metabolic Disorder Labs: Lab Results  Component Value Date    HGBA1C 5.2 07/15/2016   MPG 103 07/15/2016   No results found for: PROLACTIN Lab Results  Component Value Date   CHOL 187 07/15/2016   TRIG 86 07/15/2016   HDL 30 (L) 07/15/2016   CHOLHDL 6.2 07/15/2016   VLDL 17 07/15/2016   LDLCALC 140 (H) 07/15/2016    Physical Findings: AIMS: Facial and Oral Movements Muscles of Facial Expression: None, normal Lips and Perioral Area: None, normal Jaw: None, normal Tongue: None, normal,Extremity Movements Upper (arms, wrists, hands, fingers): None, normal Lower (legs, knees, ankles, toes): None, normal, Trunk Movements Neck, shoulders, hips: None, normal, Overall Severity Severity of abnormal movements (highest score from questions above): None, normal Incapacitation due to abnormal movements: None, normal Patient's awareness of abnormal movements (rate only patient's report): No Awareness, Dental Status Current problems with teeth and/or dentures?: No Does patient usually wear dentures?: No  CIWA:    COWS:     Musculoskeletal: Strength & Muscle Tone: within normal limits Gait & Station: normal, unable to stand Patient leans: N/A  Psychiatric Specialty Exam: Physical Exam  Nursing note and vitals reviewed. Constitutional: He appears well-developed and well-nourished.  HENT:  Head: Normocephalic and atraumatic.  Eyes: Conjunctivae are normal. Pupils are equal, round, and reactive to light.  Neck: Normal range of motion.  Cardiovascular: Regular rhythm and normal heart sounds.   Respiratory: Effort normal. No respiratory distress.  GI: Soft.  Musculoskeletal: Normal range of motion.  Neurological: He is alert.  Skin: Skin is warm and dry.  Psychiatric: Judgment normal. His mood appears anxious. His affect is blunt and inappropriate. His speech is delayed. He is slowed and withdrawn. Thought content is not paranoid. Cognition and memory are impaired. He does not exhibit a depressed mood. He expresses no homicidal and no suicidal  ideation. He is noncommunicative.    Review of Systems  Unable to perform ROS: Patient nonverbal  Constitutional: Negative.   HENT: Negative.   Eyes: Negative.   Respiratory: Negative.   Cardiovascular: Negative.   Gastrointestinal: Negative.   Musculoskeletal: Negative.   Skin: Negative.   Neurological: Negative.   Psychiatric/Behavioral: Positive for depression and memory loss. Negative for hallucinations, substance abuse and suicidal ideas. The patient is nervous/anxious and has insomnia.     Blood pressure 107/68, pulse 68, temperature 98.1 F (36.7 C), resp. rate 16, height 5\' 9"  (1.753 m), weight 79.8 kg (176 lb), SpO2 98 %.Body mass index is 25.99 kg/m.  General Appearance: Casual  Eye Contact:  Negative  Speech:  Slow  Volume:  Decreased  Mood:  Negative  Affect:  Congruent  Thought Process:  NA  Orientation:  Negative  Thought Content:  Negative  Suicidal Thoughts:  No  Homicidal Thoughts:  No  Memory:  Immediate;   Poor Recent;   Poor Remote;   Poor  Judgement:  Fair  Insight:  Fair  Psychomotor Activity:  Decreased  Concentration:  Concentration: Negative  Recall:  FiservFair  Fund of Knowledge:  Fair  Language:  Fair  Akathisia:  No  Handed:  Right  AIMS (if indicated):     Assets:  Desire for Improvement Housing Physical Health Resilience Social Support  ADL's:  Intact  Cognition:  WNL  Sleep:  Number of Hours: 8     Treatment Plan Summary: Daily contact with patient to assess and evaluate symptoms and progress in treatment, Medication management and Plan Catatonic again. Not able to respond. I will give him some more frequent Ativan doses. I spoke with the patient on the assumption that he might understand. Explained ECT and the rationale for it. He made no response. We will plan to get started with treatment if possible tomorrow. We will then have a week and a half off but at least we can get 1 treatment in. Bilateral ECT will be scheduled for  tomorrow.  Mordecai RasmussenJohn Clapacs, MD 07/24/2016, 12:05 PM

## 2016-07-24 NOTE — [facility] Group Notes (Signed)
[facility] LCSW Group Therapy  07/24/2016 5:29 PM  Type of Therapy:  Group Therapy  Participation Level:  Patient did not attend group. CSW invited patient to group.   Summary of Progress/Problems: Balance in life: Patients will discuss the concept of balance and how it looks and feels to be unbalanced. Pt will identify areas in their life that is unbalanced and ways to become more balanced. They discussed what aspects in their lives has influenced their self care. Patients also discussed self care in the areas of self regulation/control, hygiene/appearance, sleep/relaxation, healthy leisure, healthy eating habits, exercise, inner peace/spirituality, self improvement, sobriety, and health management. They were challenged to identify changes that are needed in order to improve self care.   Mirenda Baltazar G. Garnette CzechSampson MSW, LCSWA 07/24/2016, 5:29 PM

## 2016-07-24 NOTE — [facility] Group Notes (Signed)
[facility] LCSW Group Therapy  ** Late entry for 07/23/2016  07/24/2016  9:30 am   Type of Therapy: Group Therapy   Participation Level: Active   Participation Quality: Attentive, Sharing and Supportive   Affect: Appropriate  Cognitive: Alert and Oriented   Insight: Developing/Improving and Engaged   Engagement in Therapy: Developing/Improving and Engaged   Modes of Intervention: Clarification, Confrontation, Discussion, Education, Exploration, Limit-setting, Orientation, Problem-solving, Rapport Building, Dance movement psychotherapisteality Testing, Socialization and Support   Summary of Progress/Problems: The topic for group today was emotional regulation. This group focused on both positive and negative emotion identification and allowed  group members to process ways to identify feelings, regulate negative emotions, and find healthy ways to manage internal/external emotions. Group members were asked to reflect on a time when their reaction to an emotion led to a negative outcome and explored how alternative responses using emotion regulation would have benefited them. Group members were also asked to discuss a time when emotion regulation was utilized when a negative emotion was experienced.  Pt defined emotion regulation as keeping emotions in check, not holding emotions in. Pt identified two negative emotions as hurt and sympathetic. He stated to regulate those negative emotions, he will engage in self-care activities such as prayer and caring for his two children.    Hampton AbbotKadijah Jaymir Struble, MSW, LCSWA 07/24/2016, 9:58AM

## 2016-07-24 NOTE — [facility] Group Notes (Signed)
[facility] Group Notes:  (Nursing/MHT/Case Management/Adjunct)  Date:  07/24/2016  Time:  7:20 AM  Type of Therapy:  Group Therapy  Participation Level:  Did Not Attend    Veva Holesshley Imani Zachery Niswander 07/24/2016, 7:20 AM

## 2016-07-24 NOTE — [facility] Group Notes (Signed)
[facility] Group Notes:  (Nursing/MHT/Case Management/Adjunct)  Date:  07/24/2016  Time:  5:17 PM  Type of Therapy:  Psychoeducational Skills  Participation Level:  Did Not Attend  Jamisen Hawes C Festus Pursel 07/24/2016, 5:17 PM 

## 2016-07-25 ENCOUNTER — Other Ambulatory Visit: Payer: Self-pay | Admitting: Psychiatry

## 2016-07-25 ENCOUNTER — Inpatient Hospital Stay: Payer: No Typology Code available for payment source | Admitting: Anesthesiology

## 2016-07-25 ENCOUNTER — Encounter: Payer: Self-pay | Admitting: *Deleted

## 2016-07-25 LAB — GLUCOSE, CAPILLARY: GLUCOSE-CAPILLARY: 97 mg/dL (ref 65–99)

## 2016-07-25 MED ORDER — SUCCINYLCHOLINE CHLORIDE 200 MG/10ML IV SOSY
PREFILLED_SYRINGE | INTRAVENOUS | Status: DC | PRN
  Administered 2016-07-25: 100 mg via INTRAVENOUS

## 2016-07-25 MED ORDER — METHOHEXITAL SODIUM 100 MG/10ML IV SOSY
PREFILLED_SYRINGE | INTRAVENOUS | Status: DC | PRN
  Administered 2016-07-25: 80 mg via INTRAVENOUS

## 2016-07-25 MED ORDER — FLUVOXAMINE MALEATE 100 MG PO TABS: 100.0000 mg | ORAL_TABLET | Freq: Every day | ORAL | 1 refills | Status: DC

## 2016-07-25 MED ORDER — IBUPROFEN 800 MG PO TABS
800.0000 mg | ORAL_TABLET | Freq: Three times a day (TID) | ORAL | Status: DC | PRN
  Administered 2016-07-25: 800 mg via ORAL
  Filled 2016-07-25: qty 1

## 2016-07-25 MED ORDER — LORAZEPAM 2 MG PO TABS: 2.0000 mg | ORAL_TABLET | Freq: Four times a day (QID) | ORAL | 0 refills | Status: DC | PRN

## 2016-07-25 MED ORDER — SODIUM CHLORIDE 0.9 % IV SOLN
INTRAVENOUS | Status: DC | PRN
  Administered 2016-07-25: 11:00:00 via INTRAVENOUS

## 2016-07-25 MED ORDER — SUCCINYLCHOLINE CHLORIDE 200 MG/10ML IV SOSY
PREFILLED_SYRINGE | INTRAVENOUS | Status: AC
  Filled 2016-07-25: qty 10

## 2016-07-25 MED ORDER — ARIPIPRAZOLE 5 MG PO TABS: 5.0000 mg | ORAL_TABLET | Freq: Every day | ORAL | 1 refills | Status: DC

## 2016-07-25 MED ORDER — SODIUM CHLORIDE 0.9 % IV SOLN
500.0000 mL | Freq: Once | INTRAVENOUS | Status: AC
  Administered 2016-07-25: 1000 mL via INTRAVENOUS

## 2016-07-25 MED ORDER — TRAZODONE HCL 100 MG PO TABS: 100.0000 mg | ORAL_TABLET | Freq: Every evening | ORAL | 1 refills | Status: DC | PRN

## 2016-07-25 NOTE — Anesthesia Preprocedure Evaluation (Signed)
Anesthesia Evaluation  Patient identified by MRN, date of birth, ID band Patient awake    Reviewed: Allergy & Precautions, H&P , NPO status , Patient's Chart, lab work & pertinent test results  History of Anesthesia Complications Negative for: history of anesthetic complications  Airway Mallampati: I  TM Distance: >3 FB Neck ROM: full    Dental  (+) Poor Dentition, Chipped   Pulmonary neg shortness of breath, former smoker,    Pulmonary exam normal breath sounds clear to auscultation       Cardiovascular Exercise Tolerance: Good hypertension, (-) angina(-) Past MI and (-) DOE Normal cardiovascular exam Rhythm:regular Rate:Normal     Neuro/Psych PSYCHIATRIC DISORDERS negative neurological ROS     GI/Hepatic negative GI ROS, Neg liver ROS, neg GERD  ,  Endo/Other  negative endocrine ROS  Renal/GU negative Renal ROS  negative genitourinary   Musculoskeletal   Abdominal   Peds  Hematology negative hematology ROS (+)   Anesthesia Other Findings Past Medical History: No date: Depression No date: Herniated disc     Comment: x3 No date: Hypertension No date: Mental disorder  Past Surgical History: No date: SHOULDER SURGERY  BMI    Body Mass Index:  26.58 kg/m      Reproductive/Obstetrics negative OB ROS                             Anesthesia Physical Anesthesia Plan  ASA: III  Anesthesia Plan: General   Post-op Pain Management:    Induction:   Airway Management Planned:   Additional Equipment:   Intra-op Plan:   Post-operative Plan:   Informed Consent: I have reviewed the patients History and Physical, chart, labs and discussed the procedure including the risks, benefits and alternatives for the proposed anesthesia with the patient or authorized representative who has indicated his/her understanding and acceptance.   Dental Advisory Given  Plan Discussed with:  Anesthesiologist, CRNA and Surgeon  Anesthesia Plan Comments:         Anesthesia Quick Evaluation

## 2016-07-25 NOTE — Progress Notes (Signed)
Pt awake, alert, oriented, talking and walking around unit this morning. Requests ativan at this time. No signs of catatonia present, pt is NPO for ECT. Safety maintained with every 15 minute checks. Will continue to monitor.

## 2016-07-25 NOTE — Anesthesia Postprocedure Evaluation (Signed)
Anesthesia Post Note  Patient: Benjamin [name]  Procedure(s) Performed: * No procedures listed *  Patient location during evaluation: PACU Anesthesia Type: General Level of consciousness: awake and alert Pain management: pain level controlled Vital Signs Assessment: post-procedure vital signs reviewed and stable Respiratory status: spontaneous breathing, nonlabored ventilation, respiratory function stable and patient connected to nasal cannula oxygen Cardiovascular status: blood pressure returned to baseline and stable Postop Assessment: no signs of nausea or vomiting Anesthetic complications: no     Last Vitals:  Vitals:   07/25/16 1155 07/25/16 1205  BP: (!) 147/89 (!) 147/88  Pulse: (!) 101 85  Resp:  12  Temp:      Last Pain:  Vitals:   07/25/16 1205  TempSrc:   PainSc: 0-No pain                 Cleda MccreedyJoseph K Catlin Doria

## 2016-07-25 NOTE — Discharge Summary (Signed)
Physician Discharge Summary Note  Patient:  Benjamin [name] is an 35 y.o., male MRN:  [id number] DOB:  22-Dec-1980 Patient phone:  [phone] (home)  Patient address:   [address] [place] [address],  Total Time spent with patient: 45 minutes  Date of Admission:  07/21/2016 Date of Discharge: 07/26/2016  Reason for Admission:  Patient was admitted in transfer from [facility] because of catatonia and depression. Primarily he was sent here for consideration for ECT. Patient was admitted to the hospital because of a return of catatonic symptoms.  Principal Problem: MDD (major depressive disorder) Discharge Diagnoses: Patient Active Problem List   Diagnosis Date Noted  . Severe recurrent major depression with psychotic features (HCC) [F33.3] 07/21/2016  . MDD (major depressive disorder) [F32.9] 07/21/2016  . PTSD (post-traumatic stress disorder) [F43.10] 07/15/2016  . OCD (obsessive compulsive disorder) [F42.9] 07/15/2016  . 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 Bradycardia [R00.1] 09/18/2012  . Decreased oral intake [R63.8] 09/17/2012  . Psychosis [F29] 09/09/2012  . Severe episode of recurrent major depressive disorder, with psychotic features (HCC) [F33.3] 09/08/2012  . History of substance abuse [Z87.898] 09/08/2012    Past Psychiatric History: Patient has a history of an episode of depression with catatonia in 2014 and then in interval history of being asymptomatic for several years. This month he has had a return of catatonic episodes with anxiety and depression. Has a past history of substance abuse. No other hospitalizations. Denies suicidal behavior in the past. No history of violence.  Past Medical History:  Past Medical History:  Diagnosis Date  . Depression   . Herniated disc    x3  . Hypertension   . Mental  disorder     Past Surgical History:  Procedure Laterality Date  . SHOULDER SURGERY     Family History: History reviewed. No pertinent family history. Family Psychiatric  History: None is known Social History:  History  Alcohol Use No     History  Drug Use No    Social History   Social History  . Marital status: Single    Spouse name: N/A  . Number of children: N/A  . Years of education: N/A   Social History Main Topics  . Smoking status: Former Smoker    Packs/day: 1.00    Types: Cigarettes    Quit date: 04/04/2012  . Smokeless tobacco: Never Used  . Alcohol use No  . Drug use: No  . Sexual activity: No   Other Topics Concern  . None   Social History Narrative  . None    Hospital Course:  Patient was admitted to the psychiatric hospital and had a fluctuating course. Before we could initiate ECT his catatonia resolved with Ativan. For most of his time in the hospital catatonia was improved and he was treated with antidepressants and antipsychotics although there were still a couple of episodes of catatonic behavior. We had one somewhat atypical episode when he was discharged from the hospital to go attend a court hearing and then returned in the afternoon for what was to be a scheduled admission only to find himself stuck in the emergency room for the weekend. Generally he has been doing well and cooperative with treatment. He has not shown any dangerous or suicidal behavior. Because of a return of catatonia yesterday I went ahead and had them treated with ECT today and he had  1 bilateral ECT treatment which went well although he is having some muscle stiffness afterwards. At this point we will not be having ECT again until January 3. Patient is requesting discharge. We have discussed the risks and benefits but he is not committable and will be discharged tomorrow with follow-up at family services.  Physical Findings: AIMS: Facial and Oral Movements Muscles of Facial  Expression: None, normal Lips and Perioral Area: None, normal Jaw: None, normal Tongue: None, normal,Extremity Movements Upper (arms, wrists, hands, fingers): None, normal Lower (legs, knees, ankles, toes): None, normal, Trunk Movements Neck, shoulders, hips: None, normal, Overall Severity Severity of abnormal movements (highest score from questions above): None, normal Incapacitation due to abnormal movements: None, normal Patient's awareness of abnormal movements (rate only patient's report): No Awareness, Dental Status Current problems with teeth and/or dentures?: No Does patient usually wear dentures?: No  CIWA:    COWS:     Musculoskeletal: Strength & Muscle Tone: within normal limits Gait & Station: normal Patient leans: N/A  Psychiatric Specialty Exam: Physical Exam  Nursing note and vitals reviewed. Constitutional: He appears well-developed and well-nourished.  HENT:  Head: Normocephalic and atraumatic.  Eyes: Conjunctivae are normal. Pupils are equal, round, and reactive to light.  Neck: Normal range of motion.  Cardiovascular: Regular rhythm and normal heart sounds.   Respiratory: Effort normal. No respiratory distress.  GI: Soft.  Musculoskeletal: Normal range of motion.  Neurological: He is alert.  Skin: Skin is warm and dry.  Psychiatric: He has a normal mood and affect. His behavior is normal. Judgment and thought content normal.    Review of Systems  Constitutional: Negative.   HENT: Negative.   Eyes: Negative.   Respiratory: Negative.   Cardiovascular: Negative.   Gastrointestinal: Negative.   Musculoskeletal: Negative.   Skin: Negative.   Neurological: Negative.   Psychiatric/Behavioral: Negative for depression, hallucinations, memory loss, substance abuse and suicidal ideas. The patient is not nervous/anxious and does not have insomnia.     Blood pressure (!) 147/88, pulse 85, temperature 97.8 F (36.6 C), resp. rate 12, height 5\' 9"  (1.753 m),  weight 81.6 kg (180 lb), SpO2 97 %.Body mass index is 26.58 kg/m.  General Appearance: Casual  Eye Contact:  Good  Speech:  Clear and Coherent  Volume:  Normal  Mood:  Anxious  Affect:  Congruent  Thought Process:  Goal Directed  Orientation:  Full (Time, Place, and Person)  Thought Content:  Logical  Suicidal Thoughts:  No  Homicidal Thoughts:  No  Memory:  Immediate;   Good Recent;   Fair Remote;   Fair  Judgement:  Fair  Insight:  Fair  Psychomotor Activity:  Normal  Concentration:  Concentration: Fair  Recall:  FiservFair  Fund of Knowledge:  Fair  Language:  Fair  Akathisia:  No  Handed:  Right  AIMS (if indicated):     Assets:  Communication Skills Desire for Improvement Financial Resources/Insurance Housing Physical Health Resilience Social Support  ADL's:  Intact  Cognition:  WNL  Sleep:  Number of Hours: 6.45     Have you used any form of tobacco in the last 30 days? (Cigarettes, Smokeless Tobacco, Cigars, and/or Pipes): No  Has this patient used any form of tobacco in the last 30 days? (Cigarettes, Smokeless Tobacco, Cigars, and/or Pipes) Yes, No  Blood Alcohol level:  Lab Results  Component Value Date   [facility]ETH <5 07/18/2016   ETH <5 07/14/2016    Metabolic Disorder Labs:  Lab Results  Component Value Date   HGBA1C 5.2 07/15/2016   MPG 103 07/15/2016   No results found for: PROLACTIN Lab Results  Component Value Date   CHOL 187 07/15/2016   TRIG 86 07/15/2016   HDL 30 (L) 07/15/2016   CHOLHDL 6.2 07/15/2016   VLDL 17 07/15/2016   LDLCALC 140 (H) 07/15/2016    See Psychiatric Specialty Exam and Suicide Risk Assessment completed by Attending Physician prior to discharge.  Discharge destination:  Home  Is patient on multiple antipsychotic therapies at discharge:  No   Has Patient had three or more failed trials of antipsychotic monotherapy by history:  No  Recommended Plan for Multiple Antipsychotic Therapies: NA  Discharge Instructions     Diet - low sodium heart healthy    Complete by:  As directed    Increase activity slowly    Complete by:  As directed      Allergies as of 07/25/2016   No Known Allergies     Medication List    TAKE these medications     Indication  ARIPiprazole 5 MG tablet Commonly known as:  ABILIFY Take 1 tablet (5 mg total) by mouth daily. Start taking on:  07/26/2016  Indication:  Major Depressive Disorder   fluvoxaMINE 100 MG tablet Commonly known as:  LUVOX Take 1 tablet (100 mg total) by mouth at bedtime.  Indication:  Depression   LORazepam 2 MG tablet Commonly known as:  ATIVAN Take 1 tablet (2 mg total) by mouth every 6 (six) hours as needed for anxiety (catatonia).  Indication:  Feeling Anxious, Catatonia   traZODone 100 MG tablet Commonly known as:  DESYREL Take 1 tablet (100 mg total) by mouth at bedtime as needed for sleep.  Indication:  Trouble Sleeping        Follow-up recommendations:  Activity:  Activity as tolerated Diet:  Regular diet Other:  Continue current medicine and follow-up with local family services  Comments:  Patient is aware not to overuse the Ativan and also aware that of the availability of emergency room treatment or rehospitalization if necessary. He is currently lucid and not suicidal or dangerous. He will be discharged tomorrow with current medications as noted elsewhere.  Signed: Mordecai RasmussenJohn Clapacs, MD 07/25/2016, 6:55 PM

## 2016-07-25 NOTE — Procedures (Signed)
ECT SERVICES Physician's Interval Evaluation & Treatment Note  Patient Identification: Benjamin [name] MRN:  [id number] Date of Evaluation:  07/25/2016 TX #: 1  MADRS: 23  MMSE: 30  P.E. Findings:  Normal physical. Vitals unremarkable. Heart and lungs normal.  Psychiatric Interval Note:  Blunt slowed anxious  Subjective:  Patient is a 35 y.o. male seen for evaluation for Electroconvulsive Therapy. Feeling stable but still with intermittent anxiety  Treatment Summary:   []   Right Unilateral             [x]  Bilateral   % Energy : 1.0 ms 40%   Impedance: 1070 ohms  Seizure Energy Index: 11,365 V squared  Postictal Suppression Index: 95%  Seizure Concordance Index: 99%  Medications  Pre Shock: Brevital 80 mg succinylcholine 100 mg  Post Shock:    Seizure Duration: 50 seconds by EMG 67 seconds by EEG   Comments: Appears to of been well tolerated seizure without difficulty. I will follow-up with him. Unfortunately we will not be able to do another treatment until January 3 but I am putting him on the schedule for then with the assumption that he will probably be in the hospital until that point.   Lungs:  [x]   Clear to auscultation               []  Other:   Heart:    [x]   Regular rhythm             []  irregular rhythm    [x]   Previous H&P reviewed, patient examined and there are NO CHANGES                 []   Previous H&P reviewed, patient examined and there are changes noted.   Benjamin RasmussenJohn Clapacs, MD 12/22/201711:16 AM

## 2016-07-25 NOTE — Transfer of Care (Signed)
Immediate Anesthesia Transfer of Care Note  Patient: Cherly HensenJoshua Noyola  Procedure(s) Performed: * No procedures listed *  Patient Location: PACU  Anesthesia Type:General  Level of Consciousness: patient cooperative and lethargic  Airway & Oxygen Therapy: Patient Spontanous Breathing and Patient connected to face mask oxygen  Post-op Assessment: Report given to RN and Post -op Vital signs reviewed and stable  Post vital signs: Reviewed and stable  Last Vitals:  Vitals:   07/25/16 0900 07/25/16 1135  BP: 102/70 138/83  Pulse: 70 93  Resp: 18 14  Temp: 36.5 C 36.6 C    Last Pain:  Vitals:   07/25/16 1017  TempSrc:   PainSc: 0-No pain      Patients Stated Pain Goal: 0 (07/21/16 2135)  Complications: No apparent anesthesia complications

## 2016-07-25 NOTE — [facility] Suicide Risk Assessment (Signed)
[facility]   Principal Problem: MDD (major depressive disorder) Discharge Diagnoses:  Patient Active Problem List   Diagnosis Date Noted  . Severe recurrent major depression with psychotic features (HCC) [F33.3] 07/21/2016  . MDD (major depressive disorder) [F32.9] 07/21/2016  . PTSD (post-traumatic stress disorder) [F43.10] 07/15/2016  . OCD (obsessive compulsive disorder) [F42.9] 07/15/2016  . 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 Bradycardia [R00.1] 09/18/2012  . Decreased oral intake [R63.8] 09/17/2012  . Psychosis [F29] 09/09/2012  . Severe episode of recurrent major depressive disorder, with psychotic features (HCC) [F33.3] 09/08/2012  . History of substance abuse [Z87.898] 09/08/2012    Total Time spent with patient: 45 minutes  Musculoskeletal: Strength & Muscle Tone: within normal limits Gait & Station: normal Patient leans: N/A  Psychiatric Specialty Exam: ROS  Blood pressure (!) 147/88, pulse 85, temperature 97.8 F (36.6 C), resp. rate 12, height 5\' 9"  (1.753 m), weight 81.6 kg (180 lb), SpO2 97 %.Body mass index is 26.58 kg/m.  General Appearance: Casual  Eye Contact::  Good  Speech:  Clear and Coherent409  Volume:  Normal  Mood:  Anxious  Affect:  Congruent  Thought Process:  Goal Directed  Orientation:  Full (Time, Place, and Person)  Thought Content:  Logical  Suicidal Thoughts:  No  Homicidal Thoughts:  No  Memory:  Immediate;   Good Recent;   Fair Remote;   Fair  Judgement:  Fair  Insight:  Fair  Psychomotor Activity:  Decreased  Concentration:  Fair  Recall:  FiservFair  Fund of Knowledge:Fair  Language: Fair  Akathisia:  No  Handed:  Right  AIMS (if indicated):     Assets:  Communication Skills Desire for Improvement Financial Resources/Insurance Housing Physical  Health Resilience Social Support  Sleep:  Number of Hours: 6.45  Cognition: WNL  ADL's:  Intact   Mental Status Per Nursing Assessment::   On Admission:  NA  Demographic Factors:  Male, Divorced or widowed, Caucasian and Living alone  Loss Factors: Loss of significant relationship  Historical Factors: NA  Risk Reduction Factors:   Sense of responsibility to family, Religious beliefs about death, Positive social support and Positive therapeutic relationship  Continued Clinical Symptoms:  Severe Anxiety and/or Agitation Depression:   Impulsivity  Cognitive Features That Contribute To Risk:  Loss of executive function    Suicide Risk:  Mild:  Suicidal ideation of limited frequency, intensity, duration, and specificity.  There are no identifiable plans, no associated intent, mild dysphoria and related symptoms, good self-control (both objective and subjective assessment), few other risk factors, and identifiable protective factors, including available and accessible social support.    Plan Of Care/Follow-up recommendations:  Activity:  Activity as tolerated Diet:  Regular diet Other:  Continue current medicine and follow-up in the community as previously arranged.  Benjamin RasmussenJohn Kylena Mole, MD 07/25/2016, 6:50 PM

## 2016-07-25 NOTE — [facility] Group Notes (Signed)
[facility] LCSW Group Therapy   07/25/2016 9:30 AM   Type of Therapy: Group Therapy   Participation Level: Pt invited but did not attend.  Summary of Progress/Problems: The topic for today was feelings about relapse. Pt discussed what relapse prevention is to them and identified triggers that they are on the path to relapse. Pt processed their feeling towards relapse and was able to relate to peers. Pt discussed coping skills that can be used for relapse prevention.    Hampton AbbotKadijah Shalaya Swailes, MSW, LCSWA 07/25/2016, 11:34AM

## 2016-07-25 NOTE — Progress Notes (Signed)
Patient ID: Benjamin [name], male   DOB: [birth year], 35 y.o.   MRN: [id number] Visible in the day room socializing with peers, A&Ox3, ambulating independently without assistive device; came to me at the nurses' station: remorseful, apologectic, "I am sorry about my behavior of yesterday, you tried to help me, I couldn't come out of it, but I felt much better after they gave me Ativan, I can get it every 2 hours..." Ativan 2 mg PO PRN every 4 hours given at 2030. Patient returned at 2140, requested for Trazadone 100 mg for insomnia.  Remains NPO after MN for ECT, will obtain CBG.

## 2016-07-25 NOTE — Progress Notes (Signed)
Pt back on unit from ECT. Alert and oriented without complaints. Denies pain, SI/HI/AVH. Smiling in dayroom with appropriate interactions with staff/peers. Eating lunch. Appears to have good appetite. Pt quick to state, "i'm leaving today." When this nurse asked if the doctor had told him he would discharge today, pt questions, "am I leaving today? I was under the impression that I would leave today. I told my kids I would be home today." Informed pt that we would discuss discharge with the doctor when he rounds later this afternoon. Pt understanding. Safety maintained with every 15 minute checks. Will continue to monitor.

## 2016-07-25 NOTE — Progress Notes (Signed)
Recreation Therapy Notes  Date: 12.20.17 Time: 1:00 pm Location: Craft Room  Group Topic: Social Skills  Goal Area(s) Addresses:  Patient will effectively work with peer towards shared goal. Patient will identify skills used to make activity successful. Patient will identify benefit of using group skills effectively post d/c.  Behavioral Response: Attentive, Interactive  Intervention: [employer]er Tower  Activity: Patients were given 15 pipe cleaners and were instructed to build a free standing tower using all 15 pipe cleaners. Patients were split into 2 groups and given 2 minutes to strategize. After patients had been building for about 5 minutes, patients were instructed to put their dominant hand behind their backs. After about another 5 minutes of building, patients were instructed to stop talking to each other.  Education: LRT educated patients on healthy support systems.  Education Outcome: Acknowledges education/In group clarification offered   Clinical Observations/Feedback: Patient worked with team to build tower. Patient used effective communication, problem solving, and teamwork skills. Patient contributed to group discussion by stating what skills he used in group, why these skills are important, how he felt after using these skills in group, what prevents him from using these skills at home, and what would change for him if he started using these skills effectively post d/c.  Jacquelynn CreeGreene,Audrey Thull M, LRT/CTRS 07/25/2016 5:08 PM

## 2016-07-25 NOTE — H&P (Signed)
Benjamin HensenJoshua [name] is an 35 y.o. male.   Chief Complaint: Ongoing anxiety and depression HPI: History of recurrent episodes of catatonia and depression and anxiety  Past Medical History:  Diagnosis Date  . Depression   . Herniated disc    x3  . Hypertension   . Mental disorder     Past Surgical History:  Procedure Laterality Date  . SHOULDER SURGERY      History reviewed. No pertinent family history. Social History:  reports that he quit smoking about 4 years ago. His smoking use included Cigarettes. He smoked 1.00 pack per day. He has never used smokeless tobacco. He reports that he does not drink alcohol or use drugs.  Allergies: No Known Allergies  No prescriptions prior to admission.    Results for orders placed or performed during the hospital encounter of 07/21/16 (from the past 48 hour(s))  Glucose, capillary     Status: None   Collection Time: 07/25/16  6:33 AM  Result Value Ref Range   Glucose-Capillary 97 65 - 99 mg/dL   Comment 1 Notify RN    No results found.  Review of Systems  Constitutional: Negative.   HENT: Negative.   Eyes: Negative.   Respiratory: Negative.   Cardiovascular: Negative.   Gastrointestinal: Negative.   Musculoskeletal: Negative.   Skin: Negative.   Neurological: Negative.   Psychiatric/Behavioral: Positive for depression and memory loss. Negative for hallucinations, substance abuse and suicidal ideas. The patient is nervous/anxious and has insomnia.     Blood pressure 102/70, pulse 70, temperature 97.7 F (36.5 C), temperature source Oral, resp. rate 18, height 5\' 9"  (1.753 m), weight 81.6 kg (180 lb), SpO2 98 %. Physical Exam  Nursing note and vitals reviewed. Constitutional: He appears well-developed and well-nourished.  HENT:  Head: Normocephalic and atraumatic.  Eyes: Conjunctivae are normal. Pupils are equal, round, and reactive to light.  Neck: Normal range of motion.  Cardiovascular: Regular rhythm and normal heart sounds.    Respiratory: Breath sounds normal. He is in respiratory distress.  GI: Soft.  Musculoskeletal: Normal range of motion.  Neurological: He is alert.  Skin: Skin is warm and dry.  Psychiatric: Judgment normal. His affect is blunt. His speech is delayed. He is slowed. Cognition and memory are normal. He expresses no suicidal ideation.     Assessment/Plan Patient receiving ECT today as part of the concerted treatment to get the catatonia under control. Follow-up with scheduled next treatment after holiday on the third.  Benjamin RasmussenJohn Clapacs, MD 07/25/2016, 11:14 AM

## 2016-07-25 NOTE — Anesthesia Preprocedure Evaluation (Incomplete Revision)
Anesthesia Evaluation  Patient identified by MRN, date of birth, ID band Patient awake    Reviewed: Allergy & Precautions, H&P , NPO status , Patient's Chart, lab work & pertinent test results  History of Anesthesia Complications Negative for: history of anesthetic complications  Airway Mallampati: I  TM Distance: >3 FB Neck ROM: full    Dental  (+) Poor Dentition, Chipped   Pulmonary neg shortness of breath, former smoker,    Pulmonary exam normal breath sounds clear to auscultation       Cardiovascular Exercise Tolerance: Good hypertension, (-) angina(-) Past MI and (-) DOE Normal cardiovascular exam Rhythm:regular Rate:Normal     Neuro/Psych PSYCHIATRIC DISORDERS negative neurological ROS     GI/Hepatic negative GI ROS, Neg liver ROS, neg GERD  ,  Endo/Other  negative endocrine ROS  Renal/GU negative Renal ROS  negative genitourinary   Musculoskeletal   Abdominal   Peds  Hematology negative hematology ROS (+)   Anesthesia Other Findings Past Medical History: No date: Depression No date: Herniated disc     Comment: x3 No date: Hypertension No date: Mental disorder  Past Surgical History: No date: SHOULDER SURGERY  BMI    Body Mass Index:  26.58 kg/m      Reproductive/Obstetrics negative OB ROS                             Anesthesia Physical Anesthesia Plan  ASA: III  Anesthesia Plan: General   Post-op Pain Management:    Induction:   Airway Management Planned:   Additional Equipment:   Intra-op Plan:   Post-operative Plan:   Informed Consent: I have reviewed the patients History and Physical, chart, labs and discussed the procedure including the risks, benefits and alternatives for the proposed anesthesia with the patient or authorized representative who has indicated his/her understanding and acceptance.   Dental Advisory Given  Plan Discussed with:  Anesthesiologist, CRNA and Surgeon  Anesthesia Plan Comments:         Anesthesia Quick Evaluation  

## 2016-07-25 NOTE — Plan of Care (Signed)
Problem: Coping: Goal: Ability to demonstrate self-control will improve Outcome: Progressing Pt free of catatonia today, ECT this morning. Continues to request ativan PO every 4 hours or sooner as prescribed.

## 2016-07-25 NOTE — Progress Notes (Signed)
CBG=97, ECT ready.

## 2016-07-25 NOTE — Progress Notes (Signed)
Pt to medication room for medication administration. Requests ativan as ordered PRN. Complains of a rash to L Forearm. Appears to have red bumps, like bug bites to his arm. Localized to L forearm arm only. Pt does not complain of itching. Will inform Md. Safety maintained. Will continue to monitor.

## 2016-07-25 NOTE — Progress Notes (Signed)
[facility]  07/25/2016 6:46 PM Benjamin [name]  MRN:  [id number] Subjective:  Follow-up for Friday the 22nd. Patient had an ECT treatment bilateral treatment this morning. No complications. This was done in an effort to try and maximize treatment before the long holiday. Knowing that he was otherwise likely to be wanting discharge. Today the patient has not had any return of catatonia. His mood is reported as being slightly irritable and anxious but not hostile and not severely depressed. No suicidality expressed. No psychosis. He is alert and oriented and able to understand and discuss his treatment plan. Patient is requesting discharge home because of the upcoming Christmas holiday. Principal Problem: MDD (major depressive disorder) Diagnosis:   Patient Active Problem List   Diagnosis Date Noted  . Severe recurrent major depression with psychotic features (HCC) [F33.3] 07/21/2016  . MDD (major depressive disorder) [F32.9] 07/21/2016  . PTSD (post-traumatic stress disorder) [F43.10] 07/15/2016  . OCD (obsessive compulsive disorder) [F42.9] 07/15/2016  . 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 Bradycardia [R00.1] 09/18/2012  . Decreased oral intake [R63.8] 09/17/2012  . Psychosis [F29] 09/09/2012  . Severe episode of recurrent major depressive disorder, with psychotic features (HCC) [F33.3] 09/08/2012  . History of substance abuse [Z87.898] 09/08/2012   Total Time spent with patient: 30 minutes  Past Psychiatric History: Patient has a history of recent return of what appears to be severe depression with intermittent catatonia.  Past Medical History:  Past Medical History:  Diagnosis Date  . Depression   . Herniated disc    x3  . Hypertension   . Mental disorder     Past Surgical History:  Procedure Laterality Date  . SHOULDER  SURGERY     Family History: History reviewed. No pertinent family history. Family Psychiatric  History: No significant family history Social History:  History  Alcohol Use No     History  Drug Use No    Social History   Social History  . Marital status: Single    Spouse name: N/A  . Number of children: N/A  . Years of education: N/A   Social History Main Topics  . Smoking status: Former Smoker    Packs/day: 1.00    Types: Cigarettes    Quit date: 04/04/2012  . Smokeless tobacco: Never Used  . Alcohol use No  . Drug use: No  . Sexual activity: No   Other Topics Concern  . None   Social History Narrative  . None   Additional Social History:                         Sleep: Fair  Appetite:  Good  Current Medications: Current Facility-Administered Medications  Medication Dose Route Frequency Provider Last Rate Last Dose  . acetaminophen (TYLENOL) tablet 650 mg  650 mg Oral Q6H PRN Audery AmelJohn T Cody Oliger, MD   650 mg at 07/25/16 1346  . alum & mag hydroxide-simeth (MAALOX/MYLANTA) 200-200-20 MG/5ML suspension 30 mL  30 mL Oral Q4H PRN Audery AmelJohn T Tavie Haseman, MD      . ARIPiprazole (ABILIFY) tablet 5 mg  5 mg Oral Daily Audery AmelJohn T Debar Plate, MD   5 mg at 07/25/16 1244  . feeding supplement (ENSURE ENLIVE) (ENSURE ENLIVE) liquid 237 mL  237 mL Oral BID BM Audery AmelJohn T Fayelynn Distel, MD   237 mL at 07/23/16 1400  .  fluvoxaMINE (LUVOX) tablet 100 mg  100 mg Oral QHS Audery Amel, MD   100 mg at 07/24/16 2106  . ibuprofen (ADVIL,MOTRIN) tablet 800 mg  800 mg Oral Q8H PRN Audery Amel, MD   800 mg at 07/25/16 1540  . LORazepam (ATIVAN) injection 2 mg  2 mg Intramuscular Q2H PRN Audery Amel, MD   2 mg at 07/24/16 1351   Or  . LORazepam (ATIVAN) tablet 2 mg  2 mg Oral Q4H PRN Audery Amel, MD   2 mg at 07/25/16 1245  . magnesium hydroxide (MILK OF MAGNESIA) suspension 30 mL  30 mL Oral Daily PRN Audery Amel, MD      . traZODone (DESYREL) tablet 100 mg  100 mg Oral QHS PRN Audery Amel, MD    100 mg at 07/24/16 2140    Lab Results:  Results for orders placed or performed during the hospital encounter of 07/21/16 (from the past 48 hour(s))  Glucose, capillary     Status: None   Collection Time: 07/25/16  6:33 AM  Result Value Ref Range   Glucose-Capillary 97 65 - 99 mg/dL   Comment 1 Notify RN     Blood Alcohol level:  Lab Results  Component Value Date   ETH <5 07/18/2016   ETH <5 07/14/2016    Metabolic Disorder Labs: Lab Results  Component Value Date   HGBA1C 5.2 07/15/2016   MPG 103 07/15/2016   No results found for: PROLACTIN Lab Results  Component Value Date   CHOL 187 07/15/2016   TRIG 86 07/15/2016   HDL 30 (L) 07/15/2016   CHOLHDL 6.2 07/15/2016   VLDL 17 07/15/2016   LDLCALC 140 (H) 07/15/2016    Physical Findings: AIMS: Facial and Oral Movements Muscles of Facial Expression: None, normal Lips and Perioral Area: None, normal Jaw: None, normal Tongue: None, normal,Extremity Movements Upper (arms, wrists, hands, fingers): None, normal Lower (legs, knees, ankles, toes): None, normal, Trunk Movements Neck, shoulders, hips: None, normal, Overall Severity Severity of abnormal movements (highest score from questions above): None, normal Incapacitation due to abnormal movements: None, normal Patient's awareness of abnormal movements (rate only patient's report): No Awareness, Dental Status Current problems with teeth and/or dentures?: No Does patient usually wear dentures?: No  CIWA:    COWS:     Musculoskeletal: Strength & Muscle Tone: within normal limits Gait & Station: normal, unable to stand Patient leans: N/A  Psychiatric Specialty Exam: Physical Exam  Nursing note and vitals reviewed. Constitutional: He appears well-developed and well-nourished.  HENT:  Head: Normocephalic and atraumatic.  Eyes: Conjunctivae are normal. Pupils are equal, round, and reactive to light.  Neck: Normal range of motion.  Cardiovascular: Regular rhythm  and normal heart sounds.   Respiratory: Effort normal. No respiratory distress.  GI: Soft.  Musculoskeletal: Normal range of motion.  Neurological: He is alert.  Skin: Skin is warm and dry.  Psychiatric: Judgment normal. His mood appears anxious. His affect is not blunt and not inappropriate. His speech is not delayed. He is not slowed and not withdrawn. Thought content is not paranoid. Cognition and memory are not impaired. He does not exhibit a depressed mood. He expresses no homicidal and no suicidal ideation. He is communicative.    Review of Systems  Unable to perform ROS: Patient nonverbal  Constitutional: Negative.   HENT: Negative.   Eyes: Negative.   Respiratory: Negative.   Cardiovascular: Negative.   Gastrointestinal: Negative.   Musculoskeletal: Negative.  Skin: Negative.   Neurological: Negative.   Psychiatric/Behavioral: Positive for memory loss. Negative for depression, hallucinations, substance abuse and suicidal ideas. The patient is nervous/anxious and has insomnia.     Blood pressure (!) 147/88, pulse 85, temperature 97.8 F (36.6 C), resp. rate 12, height 5\' 9"  (1.753 m), weight 81.6 kg (180 lb), SpO2 97 %.Body mass index is 26.58 kg/m.  General Appearance: Casual  Eye Contact:  Good  Speech:  Clear and Coherent  Volume:  Normal  Mood:  Anxious  Affect:  Congruent  Thought Process:  NA  Orientation:  Full (Time, Place, and Person)  Thought Content:  Logical  Suicidal Thoughts:  No  Homicidal Thoughts:  No  Memory:  Immediate;   Good Recent;   Fair Remote;   Fair  Judgement:  Fair  Insight:  Fair  Psychomotor Activity:  Normal  Concentration:  Concentration: Fair  Recall:  [employer]Fair  Fund of Knowledge:  Fair  Language:  Fair  Akathisia:  No  Handed:  Right  AIMS (if indicated):     Assets:  Desire for Improvement Housing Physical Health Resilience Social Support  ADL's:  Intact  Cognition:  WNL  Sleep:  Number of Hours: 6.45     Treatment Plan  Summary: Daily contact with patient to assess and evaluate symptoms and progress in treatment, Medication management and Plan Patient has signed a 72 hour release form. He is not suicidal or homicidal and has not been suicidal throughout the time he was sick. Under the circumstances I don't think there is any justification for filing commitment. If he is going to be released and 72 hours I think that we might as well go ahead and discharge him tomorrow given that he seems to be doing much better right now. Spent some time discussing the potential of return of catatonia. Also discussing the use of medication. Clearly the Ativan is necessary right now but as I explained to him I would hope that eventually that could be tapered off. He is agreeable to the plan. I have agreed to give him Ativan 2 mg 4 times a day at discharge which she can use as needed for anxiety. Continue his other medication as prescribed for now. Patient will be discharged tomorrow morning. Case reviewed with nursing.  Mordecai RasmussenJohn Altariq Goodall, MD 07/25/2016, 6:46 PM

## 2016-07-26 DIAGNOSIS — F333 Major depressive disorder, recurrent, severe with psychotic symptoms: Principal | ICD-10-CM

## 2016-07-26 MED ORDER — LORAZEPAM 2 MG PO TABS: 2.0000 mg | ORAL_TABLET | Freq: Four times a day (QID) | ORAL | 0 refills | Status: DC | PRN

## 2016-07-26 NOTE — Progress Notes (Signed)
Pt cooperative with treatment on shift. No request of Ativan on shift or not behavioral issues noted thus far. No signs of catatonia present. Safety maintained with every 15 minute checks. Will continue to monitor, he appears to be in bed resting quietly at this time.

## 2016-07-26 NOTE — Progress Notes (Signed)
[facility]  07/26/2016 9:38 AM Benjamin [name]  MRN:  [id number] Subjective:  Follow-up for Friday the 22nd. Patient had an ECT treatment bilateral treatment this morning. No complications. This was done in an effort to try and maximize treatment before the long holiday. Knowing that he was otherwise likely to be wanting discharge. Today the patient has not had any return of catatonia. His mood is reported as being slightly irritable and anxious but not hostile and not severely depressed. No suicidality expressed. No psychosis. He is alert and oriented and able to understand and discuss his treatment plan. Patient is requesting discharge home because of the upcoming Christmas holiday.  12/23 patient reports doing very well today. He reports significant improvement in mood, appetite, energy, sleep and concentration. He is clearly not longer catatonic. He was able to engage in an assessment with me. He displays good hygiene and grooming, he stopped process is linear and goal directed. He denies suicidality, homicidality or auditory or visual hallucinations. He denies side effects from medications or having any physical complaints.  Patient denies having any access to guns  The staff working with the patient today does not have any concerns about his safety.  Per nursing: Pt cooperative with treatment on shift. No request of Ativan on shift or not behavioral issues noted thus far. No signs of catatonia present. Safety maintained with every 15 minute checks. Will continue to monitor, he appears to be in bed resting quietly at this time.  Principal Problem: MDD (major depressive disorder) Diagnosis:   Patient Active Problem List   Diagnosis Date Noted  . Severe recurrent major depression with psychotic features (HCC) [F33.3] 07/21/2016  . MDD (major depressive disorder) [F32.9] 07/21/2016  . PTSD (post-traumatic stress disorder) [F43.10] 07/15/2016  . OCD (obsessive compulsive disorder)  [F42.9] 07/15/2016  . 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 Bradycardia [R00.1] 09/18/2012  . Decreased oral intake [R63.8] 09/17/2012  . Psychosis [F29] 09/09/2012  . Severe episode of recurrent major depressive disorder, with psychotic features (HCC) [F33.3] 09/08/2012  . History of substance abuse [Z87.898] 09/08/2012   Total Time spent with patient: 30 minutes  Past Psychiatric History: Patient has a history of recent return of what appears to be severe depression with intermittent catatonia.  Past Medical History:  Past Medical History:  Diagnosis Date  . Depression   . Herniated disc    x3  . Hypertension   . Mental disorder     Past Surgical History:  Procedure Laterality Date  . SHOULDER SURGERY     Family History: History reviewed. No pertinent family history. Family Psychiatric  History: No significant family history Social History:  History  Alcohol Use No     History  Drug Use No    Social History   Social History  . Marital status: Single    Spouse name: N/A  . Number of children: N/A  . Years of education: N/A   Social History Main Topics  . Smoking status: Former Smoker    Packs/day: 1.00    Types: Cigarettes    Quit date: 04/04/2012  . Smokeless tobacco: Never Used  . Alcohol use No  . Drug use: No  . Sexual activity: No   Other Topics Concern  . None   Social History Narrative  . None   Additional Social History:  Specify valuables returned: [employer] debit  card, $4.55, bus card, drivers licnse, 3 keys on silver and teal key ring, shoes with shoestrings, black belt       Current Medications: Current Facility-Administered Medications  Medication Dose Route Frequency Provider Last Rate Last Dose  . acetaminophen (TYLENOL) tablet 650 mg  650 mg Oral Q6H PRN Audery AmelJohn T Clapacs, MD   650 mg at  07/25/16 1346  . alum & mag hydroxide-simeth (MAALOX/MYLANTA) 200-200-20 MG/5ML suspension 30 mL  30 mL Oral Q4H PRN Audery AmelJohn T Clapacs, MD      . ARIPiprazole (ABILIFY) tablet 5 mg  5 mg Oral Daily Audery AmelJohn T Clapacs, MD   5 mg at 07/26/16 0856  . feeding supplement (ENSURE ENLIVE) (ENSURE ENLIVE) liquid 237 mL  237 mL Oral BID BM Audery AmelJohn T Clapacs, MD   237 mL at 07/23/16 1400  . fluvoxaMINE (LUVOX) tablet 100 mg  100 mg Oral QHS Audery AmelJohn T Clapacs, MD   100 mg at 07/25/16 2149  . ibuprofen (ADVIL,MOTRIN) tablet 800 mg  800 mg Oral Q8H PRN Audery AmelJohn T Clapacs, MD   800 mg at 07/25/16 1540  . LORazepam (ATIVAN) injection 2 mg  2 mg Intramuscular Q2H PRN Audery AmelJohn T Clapacs, MD   2 mg at 07/24/16 1351   Or  . LORazepam (ATIVAN) tablet 2 mg  2 mg Oral Q4H PRN Audery AmelJohn T Clapacs, MD   2 mg at 07/26/16 0817  . magnesium hydroxide (MILK OF MAGNESIA) suspension 30 mL  30 mL Oral Daily PRN Audery AmelJohn T Clapacs, MD      . traZODone (DESYREL) tablet 100 mg  100 mg Oral QHS PRN Audery AmelJohn T Clapacs, MD   100 mg at 07/24/16 2140    Lab Results:  Results for orders placed or performed during the [name] encounter of 07/21/16 (from the past 48 hour(s))  Glucose, capillary     Status: None   Collection Time: 07/25/16  6:33 AM  Result Value Ref Range   Glucose-Capillary 97 65 - 99 mg/dL   Comment 1 Notify RN     Blood Alcohol level:  Lab Results  Component Value Date   ETH <5 07/18/2016   ETH <5 07/14/2016    Metabolic Disorder Labs: Lab Results  Component Value Date   HGBA1C 5.2 07/15/2016   MPG 103 07/15/2016   No results found for: PROLACTIN Lab Results  Component Value Date   CHOL 187 07/15/2016   TRIG 86 07/15/2016   HDL 30 (L) 07/15/2016   CHOLHDL 6.2 07/15/2016   VLDL 17 07/15/2016   LDLCALC 140 (H) 07/15/2016    Physical Findings: AIMS: Facial and Oral Movements Muscles of Facial Expression: None, normal Lips and Perioral Area: None, normal Jaw: None, normal Tongue: None, normal,Extremity Movements Upper  (arms, wrists, hands, fingers): None, normal Lower (legs, knees, ankles, toes): None, normal, Trunk Movements Neck, shoulders, hips: None, normal, Overall Severity Severity of abnormal movements (highest score from questions above): None, normal Incapacitation due to abnormal movements: None, normal Patient's awareness of abnormal movements (rate only patient's report): No Awareness, Dental Status Current problems with teeth and/or dentures?: No Does patient usually wear dentures?: No  CIWA:    COWS:     Musculoskeletal: Strength & Muscle Tone: within normal limits Gait & Station: normal, unable to stand Patient leans: N/A  Psychiatric Specialty Exam: Physical Exam  Nursing note and vitals reviewed. Constitutional: He appears well-developed and well-nourished.  HENT:  Head: Normocephalic and atraumatic.  Eyes: Conjunctivae are normal. Pupils are equal, round, and reactive to  light.  Neck: Normal range of motion.  Cardiovascular: Regular rhythm and normal heart sounds.   Respiratory: Effort normal. No respiratory distress.  GI: Soft.  Musculoskeletal: Normal range of motion.  Neurological: He is alert.  Skin: Skin is warm and dry.  Psychiatric: Judgment normal. His mood appears anxious. His affect is not blunt and not inappropriate. His speech is not delayed. He is not slowed and not withdrawn. Thought content is not paranoid. Cognition and memory are not impaired. He does not exhibit a depressed mood. He expresses no homicidal and no suicidal ideation. He is communicative.    Review of Systems  Constitutional: Negative.   HENT: Negative.   Eyes: Negative.   Respiratory: Negative.   Cardiovascular: Negative.   Gastrointestinal: Negative.   Musculoskeletal: Negative.   Skin: Negative.   Neurological: Negative.   Psychiatric/Behavioral: Negative.  Negative for depression, hallucinations, memory loss, substance abuse and suicidal ideas. The patient is not nervous/anxious and  does not have insomnia.     Blood pressure 132/81, pulse 71, temperature 98.6 F (37 C), temperature source Oral, resp. rate 18, height 5\' 9"  (1.753 m), weight 81.6 kg (180 lb), SpO2 97 %.Body mass index is 26.58 kg/m.  General Appearance: Casual  Eye Contact:  Good  Speech:  Clear and Coherent  Volume:  Normal  Mood:  Anxious  Affect:  Congruent  Thought Process:  NA  Orientation:  Full (Time, Place, and Person)  Thought Content:  Logical  Suicidal Thoughts:  No  Homicidal Thoughts:  No  Memory:  Immediate;   Good Recent;   Fair Remote;   Fair  Judgement:  Fair  Insight:  Fair  Psychomotor Activity:  Normal  Concentration:  Concentration: Fair  Recall:  [employer]Fair  Fund of Knowledge:  Fair  Language:  Fair  Akathisia:  No  Handed:  Right  AIMS (if indicated):     Assets:  Desire for Improvement Housing Physical Health Resilience Social Support  ADL's:  Intact  Cognition:  WNL  Sleep:  Number of Hours: 6.45     Treatment Plan Summary:  Per Dr. Toni Amendlapacs pt is to be d/c home today D/c summary was completed by him yesterday. Prescriptions were printed but not signed.  MDD: continue luvox 100 mg q, and abilify 5 mg  Catatonia: continue ativan 2 mg qid prn 90 tabs  Insomnia: continue trazodone 100 mg qhs  Patient has denied any access to guns. He is not longer catatonic. He does not display any evidence of psychosis, mania or hypomania. His mood is described as euthymic and his affect was bright and reactive. Patient has good grooming and hygiene. He has denied having any access to guns. Stop working with the patient does not have any concerns about his safety.  Jimmy FootmanHernandez-Gonzalez,  Desani Sprung, MD 07/26/2016, 9:38 AM

## 2016-07-26 NOTE — Progress Notes (Signed)
  [facility] :  Will you be returning to the same living situation after discharge:  Yes,  home At discharge, do you have transportation home?: Yes,  family Do you have the ability to pay for your medications: No. Referred to [facility].   Release of information consent forms completed and in the chart;  Patient's signature needed at discharge.  Patient to Follow up at: Follow-up [facility]. Go in 7 day(s).   Specialty:  Professional Counselor Why:  [facility] for outpatient services within 7 days of discharge. Walk-in hours for new patients are Monday - Friday: 8:30am-12:00pm / 1:00pm-2:30pm. Bring photo I.D., current medications, and discharge summary.  Contact information: [facility] [address] [place] [address] [phone]        [facility]. Go in 7 day(s).   Why:  Follow-up for assistance with Medications. Bring current prescriptions and be prepared to fill out paperwork.  Contact information: [address], [address], [place]  [address] Phone: [phone] Fax: [phone]           Next level of care provider has [facility] Link:no  Safety Planning and Suicide Prevention discussed: Yes,  with patient and brother  Have you used any form of tobacco in the last 30 days? (Cigarettes, Smokeless Tobacco, Cigars, and/or Pipes): No  Has patient been referred to the Quitline?: N/A patient is not a smoker  Patient has been referred for addiction treatment: Yes  Anea Fodera G. Garnette CzechSampson MSW, LCSWA 07/26/2016, 8:39 AM

## 2016-07-26 NOTE — Progress Notes (Signed)
Patient left ambulatory to her vehicle in the hospital parking lot. Left with AVS, Suicide Risk Assessments, Transition Record, and belongings. Discharge instruction explained to patient. Denied and SI/HI/AVH before being discharged.  

## 2016-07-26 NOTE — [facility] Group Notes (Signed)
[facility] Group Notes:  (Nursing/MHT/Case Management/Adjunct)  Date:  07/26/2016  Time:  4:43 AM  Type of Therapy:  Psychoeducational Skills  Participation Level:  Active  Participation Quality:  Appropriate, Sharing and Supportive  Affect:  Appropriate  Cognitive:  Appropriate  Insight:  Appropriate and Good  Engagement in Group:  Engaged  Modes of Intervention:  Discussion, Socialization and Support  Summary of Progress/Problems:  Benjamin MilroyLaquanda Y Astryd [name] 07/26/2016, 4:43 AM

## 2016-07-29 NOTE — Progress Notes (Signed)
Recreation Therapy Notes  INPATIENT RECREATION TR PLAN  Patient Details Name: Benjamin [name] MRN: [id number] DOB: [birth year] Today's Date: 07/29/2016  Rec Therapy Plan Is patient appropriate for Therapeutic Recreation?: Yes Treatment times per week: At least once a week TR Treatment/Interventions: 1:1 session, Group participation (Comment) (Appropriate participation in daily recreational therapy tx)  Discharge Criteria Pt will be discharged from therapy if:: Treatment goals are met, Discharged Treatment plan/goals/alternatives discussed and agreed upon by:: Patient/family  Discharge Summary Short term goals set: See Care Plane Short term goals met: Adequate for discharge Progress toward goals comments: Groups attended Which groups?: Self-esteem, Social skills, Other (Comment) (Self-expression) Reason goals not met: Patient d/c before goals could be met Therapeutic equipment acquired: None Reason patient discharged from therapy: Discharge from hospital Pt/family agrees with progress & goals achieved: Yes Date patient discharged from therapy: 07/26/16   Leonette Monarch, LRT/CTRS 07/29/2016, 8:42 AM

## 2016-08-05 ENCOUNTER — Other Ambulatory Visit: Payer: Self-pay | Admitting: Psychiatry

## 2016-08-08 ENCOUNTER — Emergency Department (HOSPITAL_COMMUNITY)
Admission: EM | Admit: 2016-08-08 | Discharge: 2016-08-09 | Disposition: A | Discharge status: Other Acute Inpatient Hospital | Payer: No Typology Code available for payment source | Attending: Emergency Medicine | Admitting: Emergency Medicine

## 2016-08-08 ENCOUNTER — Encounter (HOSPITAL_COMMUNITY): Payer: Self-pay | Admitting: *Deleted

## 2016-08-08 DIAGNOSIS — Z79899 Other long term (current) drug therapy: Secondary | ICD-10-CM | POA: Insufficient documentation

## 2016-08-08 DIAGNOSIS — I10 Essential (primary) hypertension: Secondary | ICD-10-CM | POA: Insufficient documentation

## 2016-08-08 DIAGNOSIS — F411 Generalized anxiety disorder: Secondary | ICD-10-CM | POA: Insufficient documentation

## 2016-08-08 DIAGNOSIS — F061 Catatonic disorder due to known physiological condition: Secondary | ICD-10-CM | POA: Diagnosis present

## 2016-08-08 DIAGNOSIS — F332 Major depressive disorder, recurrent severe without psychotic features: Secondary | ICD-10-CM

## 2016-08-08 DIAGNOSIS — Z87891 Personal history of nicotine dependence: Secondary | ICD-10-CM | POA: Insufficient documentation

## 2016-08-08 MED ORDER — LORAZEPAM 2 MG/ML IJ SOLN
1.0000 mg | Freq: Once | INTRAMUSCULAR | Status: AC
  Administered 2016-08-08: 1 mg via INTRAMUSCULAR
  Filled 2016-08-08: qty 1

## 2016-08-08 NOTE — ED Provider Notes (Signed)
[facility]-EMERGENCY DEPT Provider Note   CSN: [id number] Arrival date & time: 08/08/16  2229  By signing my name below, I, Benjamin [name], attest that this documentation has been prepared under the direction and in the presence TRW AutomotiveKelly Sagan Maselli, PA-C. Electronically Signed: [name], Scribe. 08/08/2016. 11:03 PM.   History   Chief Complaint Chief Complaint  Patient presents with  . catatonia  . IVC    History provided by: the nursing staff. No language interpreter was used.     HPI Comments: LEVEL 5 CAVEAT FOR UNRESPONSIVENESS [name] is a 36 y.o. male brought in by EMS, with PMHx significant for MDD, PTSD, catatonia, and substance abuse, who presents to the Emergency Department in a catatonic state. Nursing staff reports that patient was found by police a few hours ago in his present state. Chart review indicates that patient has a h/o becoming severely anxious secondary to life stressors which induces a catatonic state. No other complaints or pertinent information noted at this time.   Past Medical History:  Diagnosis Date  . Depression   . Herniated disc    x3  . Hypertension   . Mental disorder     Patient Active Problem List   Diagnosis Date Noted  . Severe recurrent major depression with psychotic features (HCC) 07/21/2016  . MDD (major depressive disorder) 07/21/2016  . PTSD (post-traumatic stress disorder) 07/15/2016  . OCD (obsessive compulsive disorder) 07/15/2016  . 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 Major depressive disorder, recurrent episode, severe with catatonia (HCC) 07/09/2016  . 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 Bradycardia 09/18/2012  . Decreased oral intake 09/17/2012  . Psychosis 09/09/2012  . Severe episode of recurrent major depressive disorder, with psychotic features (HCC) 09/08/2012  . History of substance abuse 09/08/2012    Past Surgical History:  Procedure Laterality Date  . SHOULDER SURGERY         Home Medications    Prior to  Admission medications   Medication Sig Start Date End Date Taking? Authorizing Provider  ARIPiprazole (ABILIFY) 5 MG tablet Take 1 tablet (5 mg total) by mouth daily. 07/26/16   Audery AmelJohn T Clapacs, MD  fluvoxaMINE (LUVOX) 100 MG tablet Take 1 tablet (100 mg total) by mouth at bedtime. 07/25/16   Audery AmelJohn T Clapacs, MD  LORazepam (ATIVAN) 2 MG tablet Take 1 tablet (2 mg total) by mouth every 6 (six) hours as needed for anxiety (catatonia). 07/26/16   Jimmy FootmanAndrea Hernandez-Gonzalez, MD  traZODone (DESYREL) 100 MG tablet Take 1 tablet (100 mg total) by mouth at bedtime as needed for sleep. 07/25/16   Audery AmelJohn T Clapacs, MD    Family History No family history on file.  Social History Social History  Substance Use Topics  . Smoking status: Former Smoker    Packs/day: 1.00    Types: Cigarettes    Quit date: 04/04/2012  . Smokeless tobacco: Never Used  . Alcohol use No     Allergies   Patient has no known allergies.   Review of Systems Review of Systems  Unable to perform ROS: Psychiatric disorder     Physical Exam Updated Vital Signs BP 154/98 (BP Location: Left Arm)   Pulse 69   Temp 97.8 F (36.6 C) (Oral)   Resp 16   Ht 5\' 9"  (1.753 m)   Wt 81.6 kg   SpO2 99%   BMI 26.58 kg/m   Physical Exam  Constitutional: He appears well-developed and well-nourished. No distress.  HENT:  Head: Normocephalic and atraumatic.  Eyes: Conjunctivae  and EOM are normal. No scleral icterus.  Neck: Normal range of motion.  Cardiovascular: Normal rate, regular rhythm and intact distal pulses.   Pulmonary/Chest: Effort normal. No respiratory distress. He has no wheezes. He has no rales.  Musculoskeletal: Normal range of motion.  Neurological: He is alert. He exhibits normal muscle tone. Coordination normal.  Skin: Skin is warm and dry. No rash noted. He is not diaphoretic. No erythema. No pallor.  Psychiatric: He has a normal mood and affect. His behavior is normal.  Nursing note and vitals  reviewed.    ED Treatments / Results  Labs (all labs ordered are listed, but only abnormal results are displayed) Labs Reviewed  CBC WITH DIFFERENTIAL/PLATELET - Abnormal; Notable for the following:       Result Value   Hemoglobin 18.4 (*)    All other components within normal limits  COMPREHENSIVE METABOLIC PANEL - Abnormal; Notable for the following:    BUN 29 (*)    Total Protein 8.4 (*)    Albumin 5.2 (*)    All other components within normal limits  ACETAMINOPHEN LEVEL - Abnormal; Notable for the following:    Acetaminophen (Tylenol), Serum <10 (*)    All other components within normal limits  ETHANOL  SALICYLATE LEVEL  RAPID URINE DRUG SCREEN, HOSP PERFORMED  OSMOLALITY    EKG  EKG Interpretation None       Radiology No results found.  Procedures Procedures (including critical care time)  DIAGNOSTIC STUDIES: Oxygen Saturation is 99% on RA, normal by my interpretation.    Medications Ordered in ED Medications  ARIPiprazole (ABILIFY) tablet 5 mg (not administered)  fluvoxaMINE (LUVOX) tablet 100 mg (not administered)  LORazepam (ATIVAN) tablet 2 mg (not administered)  traZODone (DESYREL) tablet 100 mg (not administered)  LORazepam (ATIVAN) injection 1 mg (1 mg Intramuscular Given 08/08/16 2349)     Initial Impression / Assessment and Plan / ED Course  I have reviewed the triage vital signs and the nursing notes.  Pertinent labs & imaging results that were available during my care of the patient were reviewed by me and considered in my medical decision making (see chart for details).  Clinical Course     36 year old male with a history of catatonia presents to the emergency department for evaluation of suspected catatonic state. IVC papers taken out prior to arrival. The patient has been medically cleared. TTS has evaluated the patient, who has started speaking after receiving Ativan. They recommended psychiatric evaluation in the morning. Disposition to  be determined by oncoming ED provider.   Final Clinical Impressions(s) / ED Diagnoses   Final diagnoses:  Generalized anxiety disorder    New Prescriptions New Prescriptions   No medications on file    I personally performed the services described in this documentation, which was scribed in my presence. The recorded information has been reviewed and is accurate.      Antony Madura, PA-C 08/09/16 [id number]    Donnetta Hutching, MD 08/10/16 [phone]

## 2016-08-08 NOTE — ED Triage Notes (Signed)
Pt brought in by [facility] d/t being catatonic

## 2016-08-09 ENCOUNTER — Inpatient Hospital Stay (HOSPITAL_COMMUNITY)
Admission: AD | Admit: 2016-08-09 | Discharge: 2016-08-14 | DRG: 885 | Disposition: A | Discharge status: Home / Self Care | Payer: Federal, State, Local not specified - Other | Source: Intra-hospital | Attending: Psychiatry | Admitting: Psychiatry

## 2016-08-09 ENCOUNTER — Encounter (HOSPITAL_COMMUNITY): Payer: Self-pay

## 2016-08-09 DIAGNOSIS — Z87891 Personal history of nicotine dependence: Secondary | ICD-10-CM | POA: Diagnosis not present

## 2016-08-09 DIAGNOSIS — G4700 Insomnia, unspecified: Secondary | ICD-10-CM | POA: Diagnosis present

## 2016-08-09 DIAGNOSIS — F061 Catatonic disorder due to known physiological condition: Secondary | ICD-10-CM | POA: Diagnosis present

## 2016-08-09 DIAGNOSIS — R45851 Suicidal ideations: Secondary | ICD-10-CM | POA: Diagnosis present

## 2016-08-09 DIAGNOSIS — F410 Panic disorder [episodic paroxysmal anxiety] without agoraphobia: Secondary | ICD-10-CM | POA: Diagnosis present

## 2016-08-09 DIAGNOSIS — Z79899 Other long term (current) drug therapy: Secondary | ICD-10-CM

## 2016-08-09 DIAGNOSIS — F4310 Post-traumatic stress disorder, unspecified: Secondary | ICD-10-CM | POA: Diagnosis not present

## 2016-08-09 DIAGNOSIS — F333 Major depressive disorder, recurrent, severe with psychotic symptoms: Principal | ICD-10-CM | POA: Diagnosis present

## 2016-08-09 DIAGNOSIS — F429 Obsessive-compulsive disorder, unspecified: Secondary | ICD-10-CM | POA: Diagnosis not present

## 2016-08-09 DIAGNOSIS — F411 Generalized anxiety disorder: Secondary | ICD-10-CM | POA: Diagnosis present

## 2016-08-09 DIAGNOSIS — F339 Major depressive disorder, recurrent, unspecified: Secondary | ICD-10-CM

## 2016-08-09 LAB — RAPID URINE DRUG SCREEN, HOSP PERFORMED
AMPHETAMINES: NOT DETECTED
BARBITURATES: NOT DETECTED
Benzodiazepines: NOT DETECTED
Cocaine: NOT DETECTED
OPIATES: NOT DETECTED
TETRAHYDROCANNABINOL: POSITIVE — AB

## 2016-08-09 LAB — COMPREHENSIVE METABOLIC PANEL
ALBUMIN: 5.2 g/dL — AB (ref 3.5–5.0)
ALK PHOS: 47 U/L (ref 38–126)
ALT: 32 U/L (ref 17–63)
AST: 20 U/L (ref 15–41)
Anion gap: 12 (ref 5–15)
BUN: 29 mg/dL — AB (ref 6–20)
CHLORIDE: 107 mmol/L (ref 101–111)
CO2: 23 mmol/L (ref 22–32)
CREATININE: 0.9 mg/dL (ref 0.61–1.24)
Calcium: 10.1 mg/dL (ref 8.9–10.3)
GFR calc Af Amer: >60 mL/min (ref >60)
GFR calc non Af Amer: >60 mL/min (ref >60)
GLUCOSE: 90 mg/dL (ref 65–99)
Potassium: 3.9 mmol/L (ref 3.5–5.1)
SODIUM: 142 mmol/L (ref 135–145)
Total Bilirubin: 1.2 mg/dL (ref 0.3–1.2)
Total Protein: 8.4 g/dL — ABNORMAL HIGH (ref 6.5–8.1)

## 2016-08-09 LAB — CBC WITH DIFFERENTIAL/PLATELET
BASOS ABS: 0 10*3/uL (ref 0.0–0.1)
Basophils Relative: 0 %
EOS ABS: 0.1 10*3/uL (ref 0.0–0.7)
EOS PCT: 1 %
HCT: 51.7 % (ref 39.0–52.0)
Hemoglobin: 18.4 g/dL — ABNORMAL HIGH (ref 13.0–17.0)
Lymphocytes Relative: 18 %
Lymphs Abs: 1.8 10*3/uL (ref 0.7–4.0)
MCH: 31.8 pg (ref 26.0–34.0)
MCHC: 35.6 g/dL (ref 30.0–36.0)
MCV: 89.3 fL (ref 78.0–100.0)
Monocytes Absolute: 0.5 10*3/uL (ref 0.1–1.0)
Monocytes Relative: 5 %
NEUTROS PCT: 76 %
Neutro Abs: 7.2 10*3/uL (ref 1.7–7.7)
PLATELETS: 236 10*3/uL (ref 150–400)
RBC: 5.79 MIL/uL (ref 4.22–5.81)
RDW: 13.1 % (ref 11.5–15.5)
WBC: 9.6 10*3/uL (ref 4.0–10.5)

## 2016-08-09 LAB — ETHANOL: Alcohol, Ethyl (B): <5 mg/dL (ref <5)

## 2016-08-09 LAB — ACETAMINOPHEN LEVEL

## 2016-08-09 LAB — OSMOLALITY: Osmolality: 311 mOsm/kg — ABNORMAL HIGH (ref 275–295)

## 2016-08-09 LAB — SALICYLATE LEVEL

## 2016-08-09 MED ORDER — FLUVOXAMINE MALEATE 100 MG PO TABS
100.0000 mg | ORAL_TABLET | Freq: Every day | ORAL | Status: DC
Start: 2016-08-09 — End: 2016-08-10
  Filled 2016-08-09: qty 2
  Filled 2016-08-09 (×3): qty 1

## 2016-08-09 MED ORDER — LORAZEPAM 1 MG PO TABS
2.0000 mg | ORAL_TABLET | Freq: Four times a day (QID) | ORAL | Status: DC | PRN
  Filled 2016-08-09 (×2): qty 2

## 2016-08-09 MED ORDER — ALUM & MAG HYDROXIDE-SIMETH 200-200-20 MG/5ML PO SUSP: 30.0000 mL | ORAL | Status: DC | PRN

## 2016-08-09 MED ORDER — ACETAMINOPHEN 325 MG PO TABS: 650.0000 mg | ORAL_TABLET | Freq: Four times a day (QID) | ORAL | Status: DC | PRN

## 2016-08-09 MED ORDER — FLUVOXAMINE MALEATE 100 MG PO TABS
100.0000 mg | ORAL_TABLET | Freq: Every day | ORAL | Status: DC
  Filled 2016-08-09: qty 1

## 2016-08-09 MED ORDER — ARIPIPRAZOLE 5 MG PO TABS
5.0000 mg | ORAL_TABLET | Freq: Every day | ORAL | Status: DC
  Administered 2016-08-09: 5 mg via ORAL
  Filled 2016-08-09: qty 1

## 2016-08-09 MED ORDER — ARIPIPRAZOLE 5 MG PO TABS
5.0000 mg | ORAL_TABLET | Freq: Every day | ORAL | Status: DC
  Filled 2016-08-09 (×3): qty 1

## 2016-08-09 MED ORDER — TRAZODONE HCL 100 MG PO TABS: 100.0000 mg | ORAL_TABLET | Freq: Every evening | ORAL | Status: DC | PRN

## 2016-08-09 MED ORDER — TRAZODONE HCL 100 MG PO TABS
100.0000 mg | ORAL_TABLET | Freq: Every evening | ORAL | Status: DC | PRN
  Administered 2016-08-11 – 2016-08-12 (×2): 100 mg via ORAL
  Filled 2016-08-09 (×3): qty 1
  Filled 2016-08-09: qty 7
  Filled 2016-08-09: qty 1

## 2016-08-09 MED ORDER — MAGNESIUM HYDROXIDE 400 MG/5ML PO SUSP: 30.0000 mL | Freq: Every day | ORAL | Status: DC | PRN

## 2016-08-09 MED ORDER — LORAZEPAM 1 MG PO TABS
2.0000 mg | ORAL_TABLET | Freq: Four times a day (QID) | ORAL | Status: DC | PRN
  Administered 2016-08-09 (×2): 2 mg via ORAL
  Filled 2016-08-09 (×2): qty 2

## 2016-08-09 NOTE — ED Notes (Addendum)
Pt awake & now able to eat & drink by self.  Pt stated "I've been like this for 6 days.  I haven't eaten or taken my meds.  I did well when I was @ [employer] and was just starting to go to the classes.  I think I went home too soon."  Questioned pt as to what he thought caused this episode; pt stated "my kids".  Informed pt need to provide urine specimen.  Pt stated "I can't.  I don't have anything in me.  I went to the [place] right before the police came."  Pt denies SI/HI.  Marcus, TTS, was called to assess pt @ this time.

## 2016-08-09 NOTE — Progress Notes (Signed)
D: Pt denies SI/HI/AVH. Pt is pleasant and cooperative. Pt seemed concerned about his medications when coming in. Pt appeared paranoid, but seemed to be getting used to the unit.   A: Pt was offered support and encouragement.  Pt was encourage to attend groups. Q 15 minute checks were done for safety.    R: Pt receptive to treatment and safety maintained on unit.

## 2016-08-09 NOTE — ED Notes (Signed)
Marcus, TTS, in with pt. 

## 2016-08-09 NOTE — ED Notes (Signed)
Pt becoming more physically responsive. Pt is not verbal at this time. Pt offered water to drink and pt reached out to hold the cup while taking sips of water.

## 2016-08-09 NOTE — ED Notes (Addendum)
Pt requesting ativan.

## 2016-08-09 NOTE — Progress Notes (Addendum)
Ivin BootyJoshua is a [age] year old male being admitted voluntarily to [facility] from [facility].  He came to the ED with under IVC after a family friend hasn't heard from him on a few days.  He was D/C [facility].  He reported that he was having panic attacks and has been fasting for 6 days because of the panic attacks.  He didn't want to explain the reason for fasting at this time, "I don't want to discuss it right now."  He has been sober for over 4 years and smoked marijuana on one occasion on 08/02/16.  He denies any medical issues at this time and appears to be in no physical distress.  He denies SI/HI and auditory hallucinations but admitted to having visual hallucinations but is unable to explain other than they are "strange feelings, I don't know how to explain them."  He was very pleasant and cooperative.  He is diagnosed with Major Depressive Disorder, recurrent, severe with psychotic features.  Oriented him to the unit.  Admission paperwork completed and signed.  Belongings searched and secured in locker # 8.  Skin assessment completed and noted scar on upper left shoulder blade (well healed) and healing abrasion on left foot from a blister.  Q 15 minute checks initiated for safety.  We will monitor the progress towards his goals.
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
## 2016-08-09 NOTE — Progress Notes (Signed)
CSW faxed patient's IVC paperwork to [facility].  

## 2016-08-09 NOTE — Tx Team (Signed)
Initial Treatment Plan 08/09/2016 9:20 PM Benjamin [name] [name]    PATIENT STRESSORS: Financial difficulties Occupational concerns Other: Children are staying with a friend for over a month and "I miss them"   PATIENT STRENGTHS: Capable of independent living Communication skills General fund of knowledge Motivation for treatment/growth Physical Health   PATIENT IDENTIFIED PROBLEMS: Anxiety  Psychosis  Depression  "Get better"  "I want my stress level to go down"  "I want to continue to be grateful"           DISCHARGE CRITERIA:  Improved stabilization in mood, thinking, and/or behavior Verbal commitment to aftercare and medication compliance  PRELIMINARY DISCHARGE PLAN: Outpatient therapy Medication management  PATIENT/FAMILY INVOLVEMENT: This treatment plan has been presented to and reviewed with the patient, Benjamin [name].  The patient and family have been given the opportunity to ask questions and make suggestions.  Levin BaconHeather V Kayden Hutmacher, RN 08/09/2016, 9:20 PM

## 2016-08-10 DIAGNOSIS — F061 Catatonic disorder due to known physiological condition: Secondary | ICD-10-CM

## 2016-08-10 DIAGNOSIS — F429 Obsessive-compulsive disorder, unspecified: Secondary | ICD-10-CM

## 2016-08-10 DIAGNOSIS — F339 Major depressive disorder, recurrent, unspecified: Secondary | ICD-10-CM

## 2016-08-10 DIAGNOSIS — F4310 Post-traumatic stress disorder, unspecified: Secondary | ICD-10-CM

## 2016-08-10 DIAGNOSIS — Z79899 Other long term (current) drug therapy: Secondary | ICD-10-CM

## 2016-08-10 MED ORDER — LORAZEPAM 2 MG/ML IJ SOLN
1.0000 mg | Freq: Four times a day (QID) | INTRAMUSCULAR | Status: DC | PRN
  Administered 2016-08-10: 1 mg via INTRAMUSCULAR
  Filled 2016-08-10: qty 1

## 2016-08-10 MED ORDER — LORAZEPAM 1 MG PO TABS
1.0000 mg | ORAL_TABLET | Freq: Four times a day (QID) | ORAL | Status: DC | PRN
  Administered 2016-08-11 – 2016-08-13 (×8): 1 mg via ORAL
  Filled 2016-08-10 (×7): qty 1

## 2016-08-10 NOTE — [facility] Group Notes (Signed)
[facility] Group Notes: (Clinical Social Work)   08/10/2016      Type of Therapy:  Group Therapy   Participation Level:  Did Not Attend despite MHT prompting   Aloha Bartok Grossman-Orr, LCSW 08/10/2016, 11:55 AM     

## 2016-08-10 NOTE — H&P (Addendum)
Psychiatric Admission Assessment Adult  Patient Identification: Benjamin [name] MRN:  [id number] Date of Evaluation:  08/10/2016 Chief Complaint:  MDD, Recurrent, Severe with psychotic features Principal Diagnosis: MDD (major depressive disorder), recurrent, with catatonic features ([place]) Diagnosis:   Patient Active Problem List   Diagnosis Date Noted  . MDD (major depressive disorder), recurrent, with catatonic features ([place]) [F33.9, F06.1] 08/09/2016  . Severe recurrent major depression with psychotic features ([place]) [F33.3] 07/21/2016  . MDD (major depressive disorder) [F32.9] 07/21/2016  . PTSD (post-traumatic stress disorder) [F43.10] 07/15/2016  . OCD (obsessive compulsive disorder) [F42.9] 07/15/2016  . 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 Major depressive disorder, recurrent episode, severe with catatonia ([place]) [F33.2, F06.1] 07/09/2016  . 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 Bradycardia [R00.1] 09/18/2012  . Decreased oral intake [R63.8] 09/17/2012  . Psychosis [F29] 09/09/2012  . Severe episode of recurrent major depressive disorder, with psychotic features ([place]) [F33.3] 09/08/2012  . History of substance abuse [Z87.898] 09/08/2012   History of Present Illness: Per tele assessment note- Benjamin [name] is an 36 y.o. male. -Clinician reviewed note by Myriam Forehand, nurse.  Patient was brought in by EMS after a family friend had called them because of not hearing from patient for a few days, per IVC.  Patient was discharged from [facility] on 07/26/16.  He was there earlier in the month of December also.  Patient says that he went into a "fasting" state 6 days ago.  He said that he did this because he was having a panic attack.  Patient says he has not had anything to eat or drink during that time.  Patient says he is hungry and thirsty.  He does not remember anything except "waking up" in the [facility] this morning.  He does not remember how he got here, EMS or anything.  Patient says that  he does not remember what his discharge medications were from [facility].  He does say that ativan appears to help him "snap out" of these catatonic states.Patient denies any SI, HI or visual hallucinations.  Patient does hear sounds that he thinks are directed towards him.  At times he has "fasted" for some religious reason.  This time was because of panic attacks.Patient says he is very worried that his children will be taken from him.  He says he misses them.  He says also that he has been out of work for over a month.  Associated Signs/Symptoms: Depression Symptoms:  UTA (Hypo) Manic Symptoms:  Irritable Mood, Anxiety Symptoms:  UTA Psychotic Symptoms:  UTA PTSD Symptoms: NA Total Time spent with patient: 15 minutes  Past Psychiatric History: *  Is the patient at risk to self? Yes.    Has the patient been a risk to self in the past 6 months? Yes.    Has the patient been a risk to self within the distant past? Yes.    Is the patient a risk to others? Yes.    Has the patient been a risk to others in the past 6 months? Yes.    Has the patient been a risk to others within the distant past? Yes.     Prior Inpatient Therapy:   Prior Outpatient Therapy:    Alcohol Screening: 1. How often do you have a drink containing alcohol?: Never 9. Have you or someone else been injured as a result of your drinking?: No 10. Has a relative or friend or a doctor or another health worker been concerned about your drinking or suggested you  cut down?: No Alcohol Use Disorder Identification Test Final Score (AUDIT): 0 Brief Intervention: AUDIT score less than 7 or less-screening does not suggest unhealthy drinking-brief intervention not indicated Substance Abuse History in the last 12 months:  Yes.   Consequences of Substance Abuse: Negative Previous Psychotropic Medications:  Psychological Evaluations:  Past Medical History:  Past Medical History:  Diagnosis Date  . Depression   . Herniated disc    x3  .  Hypertension   . Mental disorder     Past Surgical History:  Procedure Laterality Date  . SHOULDER SURGERY     Family History: History reviewed. No pertinent family history. Family Psychiatric  History:  Tobacco Screening: Have you used any form of tobacco in the last 30 days? 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   Negative Consequences of Use: Financial, Personal relationships, Work / Youth worker, Scientist, research (physical sciences)                    Allergies:  No Known Allergies Lab Results:  Results for orders placed or performed during the hospital encounter of 08/08/16 (from the past 48 hour(s))  CBC with Differential     Status: Abnormal   Collection Time: 08/08/16 11:54 PM  Result Value Ref Range   WBC 9.6 4.0 - 10.5 K/uL   RBC 5.79 4.22 - 5.81 MIL/uL   Hemoglobin 18.4 (H) 13.0 - 17.0 g/dL   HCT 51.7 39.0 - 52.0 %   MCV 89.3 78.0 - 100.0 fL   MCH 31.8 26.0 - 34.0 pg   MCHC 35.6 30.0 - 36.0 g/dL   RDW 13.1 11.5 - 15.5 %   Platelets 236 150 - 400 K/uL   Neutrophils Relative % 76 %   Neutro Abs 7.2 1.7 - 7.7 K/uL   Lymphocytes Relative 18 %   Lymphs Abs 1.8 0.7 - 4.0 K/uL   Monocytes Relative 5 %   Monocytes Absolute 0.5 0.1 - 1.0 K/uL   Eosinophils Relative 1 %   Eosinophils Absolute 0.1 0.0 - 0.7 K/uL   Basophils Relative 0 %   Basophils Absolute 0.0 0.0 - 0.1 K/uL  Comprehensive metabolic panel     Status: Abnormal   Collection Time: 08/08/16 11:54 PM  Result Value Ref Range   Sodium 142 135 - 145 mmol/L   Potassium 3.9 3.5 - 5.1 mmol/L   Chloride 107 101 - 111 mmol/L   CO2 23 22 - 32 mmol/L   Glucose, Bld 90 65 - 99 mg/dL   BUN 29 (H) 6 - 20 mg/dL    Creatinine, Ser 0.90 0.61 - 1.24 mg/dL   Calcium 10.1 8.9 - 10.3 mg/dL   Total Protein 8.4 (H) 6.5 - 8.1 g/dL   Albumin 5.2 (H) 3.5 - 5.0 g/dL   AST 20 15 - 41 U/L   ALT 32 17 - 63 U/L   Alkaline Phosphatase 47 38 - 126 U/L   Total Bilirubin 1.2 0.3 - 1.2 mg/dL   GFR calc non Af Amer >60 >60 mL/min   GFR calc Af Amer >60 >60 mL/min    Comment: (NOTE) The eGFR has been calculated  using the CKD EPI equation. This calculation has not been validated in all clinical situations. eGFR's persistently <60 mL/min signify possible Chronic Kidney Disease.    Anion gap 12 5 - 15  Ethanol     Status: None   Collection Time: 08/08/16 11:54 PM  Result Value Ref Range   Alcohol, Ethyl (B) <5 <5 mg/dL    Comment:        LOWEST DETECTABLE LIMIT FOR SERUM ALCOHOL IS 5 mg/dL FOR MEDICAL PURPOSES ONLY   Salicylate level     Status: None   Collection Time: 08/08/16 11:54 PM  Result Value Ref Range   Salicylate Lvl <3.8 2.8 - 30.0 mg/dL  Acetaminophen level     Status: Abnormal   Collection Time: 08/08/16 11:54 PM  Result Value Ref Range   Acetaminophen (Tylenol), Serum <10 (L) 10 - 30 ug/mL    Comment:        THERAPEUTIC CONCENTRATIONS VARY SIGNIFICANTLY. A RANGE OF 10-30 ug/mL MAY BE AN EFFECTIVE CONCENTRATION FOR MANY PATIENTS. HOWEVER, SOME ARE BEST TREATED AT CONCENTRATIONS OUTSIDE THIS RANGE. ACETAMINOPHEN CONCENTRATIONS >150 ug/mL AT 4 HOURS AFTER INGESTION AND >50 ug/mL AT 12 HOURS AFTER INGESTION ARE OFTEN ASSOCIATED WITH TOXIC REACTIONS.   Osmolality     Status: Abnormal   Collection Time: 08/08/16 11:54 PM  Result Value Ref Range   Osmolality 311 (H) 275 - 295 mOsm/kg    Comment: Performed at [facility]  Rapid urine drug screen (hospital performed)     Status: Abnormal   Collection Time: 08/09/16  5:47 AM  Result Value Ref Range   Opiates NONE DETECTED NONE DETECTED   Cocaine NONE DETECTED NONE DETECTED   Benzodiazepines NONE DETECTED NONE DETECTED    Amphetamines NONE DETECTED NONE DETECTED   Tetrahydrocannabinol POSITIVE (A) NONE DETECTED   Barbiturates NONE DETECTED NONE DETECTED    Comment:        DRUG SCREEN FOR MEDICAL PURPOSES ONLY.  IF CONFIRMATION IS NEEDED FOR ANY PURPOSE, NOTIFY LAB WITHIN 5 DAYS.        LOWEST DETECTABLE LIMITS FOR URINE DRUG SCREEN Drug Class       Cutoff (ng/mL) Amphetamine      1000 Barbiturate      200 Benzodiazepine   466 Tricyclics       599 Opiates          300 Cocaine          300 THC              50     Blood Alcohol level:  Lab Results  Component Value Date   ETH <5 08/08/2016   ETH <5 35/70/1779    Metabolic Disorder Labs:  Lab Results  Component Value Date   HGBA1C 5.2 07/15/2016   MPG 103 07/15/2016   No results found for: PROLACTIN Lab Results  Component Value Date   CHOL 187 07/15/2016   TRIG 86 07/15/2016   HDL 30 (L) 07/15/2016   CHOLHDL 6.2 07/15/2016   VLDL 17 07/15/2016   LDLCALC 140 (H) 07/15/2016    Current Medications: Current Facility-Administered Medications  Medication Dose Route Frequency Provider Last Rate Last Dose  . acetaminophen (TYLENOL) tablet 650 mg  650 mg Oral Q6H PRN Shuvon B Rankin, NP      . alum & mag hydroxide-simeth (MAALOX/MYLANTA) 200-200-20 MG/5ML suspension 30 mL  30 mL Oral Q4H PRN Shuvon B Rankin, NP      . ARIPiprazole (ABILIFY) tablet 5 mg  5  mg Oral Daily Shuvon B Rankin, NP   5 mg at 08/10/16 0756  . fluvoxaMINE (LUVOX) tablet 100 mg  100 mg Oral QHS Shuvon B Rankin, NP      . LORazepam (ATIVAN) tablet 2 mg  2 mg Oral Q6H PRN Shuvon B Rankin, NP      . magnesium hydroxide (MILK OF MAGNESIA) suspension 30 mL  30 mL Oral Daily PRN Shuvon B Rankin, NP      . traZODone (DESYREL) tablet 100 mg  100 mg Oral QHS PRN Shuvon B Rankin, NP       PTA Medications: Prescriptions Prior to Admission  Medication Sig Dispense Refill Last Dose  . ARIPiprazole (ABILIFY) 5 MG tablet Take 1 tablet (5 mg total) by mouth daily. 30 tablet 1  08/09/2016 at 1000  . fluvoxaMINE (LUVOX) 100 MG tablet Take 1 tablet (100 mg total) by mouth at bedtime. 30 tablet 1 Past Week at Unknown time  . LORazepam (ATIVAN) 2 MG tablet Take 1 tablet (2 mg total) by mouth every 6 (six) hours as needed for anxiety (catatonia). 90 tablet 0 08/08/2016 at Unknown time    Musculoskeletal: Strength & Muscle Tone: patient seen resting in bed Gait & Station: UTA Patient leans: N/A  Psychiatric Specialty Exam: Physical Exam  Nursing note and vitals reviewed. Constitutional: He is oriented to person, place, and time. He appears well-developed.  Neurological: He is alert and oriented to person, place, and time.  Psychiatric: He has a normal mood and affect. His behavior is normal.    Review of Systems  Psychiatric/Behavioral: Depression: UTA.    Blood pressure 108/74, pulse 91, temperature 97.8 F (36.6 C), temperature source Oral, resp. rate 16, height 5' 9"  (1.753 m), weight 81.2 kg (179 lb), SpO2 98 %.Body mass index is 26.43 kg/m.  General Appearance: Disheveled  Eye Contact:  Poor  Speech:  NA  Volume:  patient didnt participate wih assessment  Mood:  Irritable  Affect:  Restricted  Thought Process:  NA  Orientation:  NA  Thought Content:  NA  Suicidal Thoughts:  UTA  Homicidal Thoughts:  UTA  Memory:  NA  Judgement:  NA  Insight:  NA  Psychomotor Activity:  Negative  Concentration:  Concentration: Poor  Recall:  NA  Fund of Knowledge:  Negative  Language:  NA  Akathisia:  NA  Handed:  Right  AIMS (if indicated):     Assets:  Communication Skills  ADL's:  Impaired  Cognition:  WNL  Sleep:  Number of Hours: 5.25     I agree with current treatment plan on 08/10/2016, Patient seen face-to-face for psychiatric evaluation follow-up, chart reviewed and case discussed with the MD De Nurse. Reviewed the information documented and agree with the treatment plan.  Treatment Plan Summary: Daily contact with patient to assess and evaluate  symptoms and progress in treatment and Medication management   Hold/ discontinued -Abilify 5 mg and Luvox 100 mg  for mood stabilization. Start IM ativan for cationic  Labs; CkMB for am collection - consider ECT therapy if patient is not improving with benzodiazepines -consider transferring to ED medical to start possible IV Ativan. - staff to encourage hydration  Hold/discontiune Continue with Trazodone 100 mg for insomnia  Will continue to monitor vitals ,medication compliance and treatment side effects while patient is here.  Reviewed labs: BAL -, UDS - positive for THC and benzodizpines. CSW will start working on disposition.  Patient to participate in therapeutic milieu   Observation Level/Precautions:  15 minute checks  Laboratory:  CBC Chemistry Profile UA Osmolality was elevated at 311  Psychotherapy:  Individual  and group session  Medications:  See above  Consultations:  Psychiatry  Discharge Concerns:  Safety, stabilization, and risk of access to medication and medication stabilization   Estimated LOS: 5-7day  Other:     Physician Treatment Plan for Primary Diagnosis: MDD (major depressive disorder), recurrent, with catatonic features ([place]) Long Term Goal(s): Improvement in symptoms so as ready for discharge  Short Term Goals: Ability to identify changes in lifestyle to reduce recurrence of condition will improve, Ability to demonstrate self-control will improve and Ability to maintain clinical measurements within normal limits will improve  Physician Treatment Plan for Secondary Diagnosis: Principal Problem:   MDD (major depressive disorder), recurrent, with catatonic features ([place])  Long Term Goal(s): Improvement in symptoms so as ready for discharge  Short Term Goals: Ability to identify changes in lifestyle to reduce recurrence of condition will improve, Ability to verbalize feelings will improve, Ability to identify and develop effective coping behaviors will  improve and Compliance with prescribed medications will improve  I certify that inpatient services furnished can reasonably be expected to improve the patient's condition.    Derrill Center, NP 1/7/201811:58 AM  I have examined the patient and agreed with the findings of H&P and treatment plan. I have also done suicide assessment on this patient.  Ativan was initiated for his symptoms . Consider ECT if no improvement.

## 2016-08-10 NOTE — Treatment Plan (Signed)
Please consider referring pt to an ACTT services provider as this is the pt's third inpatient hospitalization since 07/14/16 and he appears to have difficulty being compliant with discharge instructions due to severe mental illness.

## 2016-08-10 NOTE — B[facility] Counselor (Signed)
CSW attempted to complete PSA with help of MHT to arouse patient. Patient would not respond to multiple requests of MHT to participate in assessment.  CSW will follow up.  Beverly Sessionsywan J Yann Biehn MSW, LCSW

## 2016-08-10 NOTE — [facility] Suicide Risk Assessment (Signed)
[facility] Admission Suicide Risk Assessment   Nursing information obtained from:  Patient Demographic factors:  Male, Living alone, Caucasian Current Mental Status:  NA Loss Factors:  Loss of significant relationship, Financial problems / change in socioeconomic status, Decrease in vocational status Historical Factors:  Family history of mental illness or substance abuse Risk Reduction Factors:  Responsible for children under 38 years of age  Total Time spent with patient: 1 hour Principal Problem: <principal problem not specified> Diagnosis:   Patient Active Problem List   Diagnosis Date Noted  . MDD (major depressive disorder), recurrent, with catatonic features ([facility]) [F33.9, F06.1] 08/09/2016  . Severe recurrent major depression with psychotic features ([facility]) [F33.3] 07/21/2016  . MDD (major depressive disorder) [F32.9] 07/21/2016  . PTSD (post-traumatic stress disorder) [F43.10] 07/15/2016  . OCD (obsessive compulsive disorder) [F42.9] 07/15/2016  . 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 Bradycardia [R00.1] 09/18/2012  . Decreased oral intake [R63.8] 09/17/2012  . Psychosis [F29] 09/09/2012  . Severe episode of recurrent major depressive disorder, with psychotic features ([facility]) [F33.3] 09/08/2012  . History of substance abuse [Z87.898] 09/08/2012   Subjective Data: lying in bed, does not want to talk much and endorses anxiety  Continued Clinical Symptoms:  Alcohol Use Disorder Identification Test Final Score (AUDIT): 0 The "Alcohol Use Disorders Identification Test", Guidelines for Use in Primary Care, Second Edition.  World Science writer [facility]). Score between 0-7:  no or low risk or alcohol related problems. Score between 8-15:  moderate risk of alcohol related problems. Score between 16-19:  high risk of alcohol related  problems. Score 20 or above:  warrants further diagnostic evaluation for alcohol dependence and treatment.   CLINICAL FACTORS:   Panic Attacks More than one psychiatric diagnosis Unstable or Poor Therapeutic Relationship   Musculoskeletal: Strength & Muscle Tone: within normal limits Gait & Station: normal Patient leans: lying in bed  Psychiatric Specialty Exam: Physical Exam  HENT:  Head: Normocephalic.    Review of Systems  Cardiovascular: Negative for chest pain.  Psychiatric/Behavioral: Positive for depression. The patient is nervous/anxious.     Blood pressure 108/74, pulse 91, temperature 97.8 F (36.6 C), temperature source Oral, resp. rate 16, height 5\' 9"  (1.753 m), weight 81.2 kg (179 lb), SpO2 98 %.Body mass index is 26.43 kg/m.  General Appearance: Casual  Eye Contact:  Poor  Speech:  Slow  Volume:  Decreased  Mood:  Dysphoric  Affect:  Blunt  Thought Process:  Disorganized  Orientation:  Full (Time, Place, and Person)  Thought Content:  Rumination  Suicidal Thoughts:  No  Homicidal Thoughts:  No  Memory:  Immediate;   Poor Recent;   Poor  Judgement:  Poor  Insight:  Shallow  Psychomotor Activity:  Decreased  Concentration:  Concentration: Poor and Attention Span: Poor  Recall:  Did not assess  Fund of Knowledge:  Fair  Language:  Fair  Akathisia:  Negative  Handed:  Right  AIMS (if indicated):     Assets:  Others:  poor  ADL's:  Did not assess. Not cooperative  Cognition:  Impaired,  Mild  Sleep:  Number of Hours: 5.25      COGNITIVE FEATURES THAT CONTRIBUTE TO RISK:  Closed-mindedness and Polarized thinking    SUICIDE RISK:   Moderate:  Frequent suicidal ideation with limited intensity, and duration, some specificity in terms of plans, no associated intent, good self-control,  limited dysphoria/symptomatology, some risk factors present, and identifiable protective factors, including available and accessible social support.   PLAN OF CARE:  Admit for stabilization and medication management. Safety and support.   I certify that inpatient services furnished can reasonably be expected to improve the patient's condition.  Thresa RossAKHTAR, Timberlyn Pickford, MD 08/10/2016, 11:54 AM

## 2016-08-10 NOTE — Progress Notes (Signed)
Patient has been in the bed for the majority of the shift.  Patient did not respond to being called by name or tactile stimulation.  Patient did not eat, drink, urinate or relieve his bowel.  Physician was consulted and patient was ordered ativan.  After ativan was administered and approximately 45 minutes later, patient was up talking and drinking water.  Patient also asked staff for a food tray and was able to eat.    Assess patient for safety, offer medications as prescribed, engage patient in 1:1 staff talks.   Continue to monitor as prescribed, Patient able to contract for safety.

## 2016-08-10 NOTE — Progress Notes (Signed)
Patient attend group. His day was a 5. Pt said he is glad to be alive today. Today was not a good day, he need his med's corrected. He has to take care of his kids and himself. Pt said he want each day to get better.

## 2016-08-11 DIAGNOSIS — R45851 Suicidal ideations: Secondary | ICD-10-CM

## 2016-08-11 DIAGNOSIS — Z9889 Other specified postprocedural states: Secondary | ICD-10-CM

## 2016-08-11 DIAGNOSIS — Z87891 Personal history of nicotine dependence: Secondary | ICD-10-CM

## 2016-08-11 LAB — CK TOTAL AND CKMB (NOT AT ARMC)
CK, MB: 4.3 ng/mL (ref 0.5–5.0)
RELATIVE INDEX: 1.4 (ref 0.0–2.5)
Total CK: 311 U/L (ref 49–397)

## 2016-08-11 MED ORDER — HYDROXYZINE HCL 25 MG PO TABS
25.0000 mg | ORAL_TABLET | Freq: Four times a day (QID) | ORAL | Status: DC | PRN
  Administered 2016-08-12: 25 mg via ORAL
  Filled 2016-08-11: qty 1
  Filled 2016-08-11: qty 10

## 2016-08-11 MED ORDER — FLUVOXAMINE MALEATE 100 MG PO TABS
100.0000 mg | ORAL_TABLET | Freq: Every day | ORAL | Status: DC
  Administered 2016-08-11: 100 mg via ORAL
  Filled 2016-08-11 (×3): qty 1

## 2016-08-11 MED ORDER — ARIPIPRAZOLE 5 MG PO TABS
5.0000 mg | ORAL_TABLET | Freq: Every day | ORAL | Status: DC
  Administered 2016-08-11 – 2016-08-12 (×2): 5 mg via ORAL
  Filled 2016-08-11 (×5): qty 1

## 2016-08-11 NOTE — Progress Notes (Signed)
Recreation Therapy Notes  Date: 08/11/16 Time: 1000 Location: 500 Hall Dayroom  Group Topic: Coping Skills  Goal Area(s) Addresses:  Patients will be able to identify positive coping skills. Patients will be able to identify the benefits of coping skills. Patients will be able to identify how using coping skills will be helpful post d/c.  Behavioral Response: Engaged  Intervention: Dry erase marker, dry erase board, strips of paper with various coping skills   Activity: Coping Skills Pictionary.  Patients were to pick a strip of paper from a can.  Patients were to draw what was on the paper on the board.  The remaining patients were to guess what was being drawn.  The person that guesses the picture, would get the next turn.  Education: PharmacologistCoping Skills, Building control surveyorDischarge Planning.   Education Outcome: Acknowledges understanding/In group clarification offered/Needs additional education.   Clinical Observations/Feedback: Pt was engaged, smiling and laughing during group.  Pt was also social with peers.  Pt stated that he had fun with the activity.  Caroll RancherMarjette Shriley Joffe, LRT/CTRS         Caroll RancherLindsay, Dainelle Hun A 08/11/2016 12:42 PM

## 2016-08-11 NOTE — Progress Notes (Signed)
  D: Pt was pleasant and appropriate with the Clinical research associatewriter. Informed the writer that he was anxious and was hoping to get his medication before "in the morning". Writer gave pt prn ativan. Pt has no other questions or concerns.    A:  Support and encouragement was offered. 15 min checks continued for safety.  R: Pt remains safe.

## 2016-08-11 NOTE — Progress Notes (Signed)
[facility] MD Progress Note  08/11/2016 2:11 PM Benjamin [name]  MRN:  [id number] Subjective:  Pt states " I was fasting and I went in to catatonia again. I want to get back on ECT.'  Objective: Benjamin [name] is a 23 y old CM , who has a hx of depression, catatonia , and ECT sessions in the past , who presented with catatonic behavior.  Patient seen and chart reviewed.Discussed patient with treatment team.  Pt today seen as anxious, tearful, depressed, ruminates about his children , his relationship struggles , his separation from his ex girlfriend or the mother of his two kids.Pt reports he goes back and forth in to catatonic states due to all these stressors. Pt reports he was also hearing voices or noises , but it has resolved. Pt wants to get back on his abilify and Luvox . Pt also wants to be on ativan - discussed the risks associated with BZD . Discussed that he will be referred for ECT and could transfer if accepted. Pt reports he prefers to be back on ECT at this time if possible.     Principal Problem: MDD (major depressive disorder), recurrent, with catatonic features (HCC) Diagnosis:   Patient Active Problem List   Diagnosis Date Noted  . MDD (major depressive disorder), recurrent, with catatonic features (HCC) [F33.9, F06.1] 08/09/2016  . Severe recurrent major depression with psychotic features (HCC) [F33.3] 07/21/2016  . MDD (major depressive disorder) [F32.9] 07/21/2016  . PTSD (post-traumatic stress disorder) [F43.10] 07/15/2016  . OCD (obsessive compulsive disorder) [F42.9] 07/15/2016  . 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 Bradycardia [R00.1] 09/18/2012  . Decreased oral intake [R63.8] 09/17/2012  . Psychosis [F29] 09/09/2012  . Severe episode of recurrent major depressive disorder, with psychotic features (HCC) [F33.3] 09/08/2012  .  History of substance abuse [Z87.898] 09/08/2012   Total Time spent with patient: 25 minutes  Past Psychiatric History: Please see H&P.   Past Medical History:  Past Medical History:  Diagnosis Date  . Depression   . Herniated disc    x3  . Hypertension   . Mental disorder     Past Surgical History:  Procedure Laterality Date  . SHOULDER SURGERY     Family History: Please see H&P.  Family Psychiatric  History: Please see H&P.  Social History: Please see H&P.  History  Alcohol Use No     History  Drug Use No    Social History   Social History  . Marital status: Single    Spouse name: N/A  . Number of children: N/A  . Years of education: N/A   Social History Main Topics  . Smoking status: Former Smoker    Packs/day: 1.00    Types: Cigarettes    Quit date: 04/04/2012  . Smokeless tobacco: Never Used  . Alcohol use No  . Drug use: No  . Sexual activity: No   Other Topics Concern  . None   Social History Narrative  . 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   Negative Consequences of Use: Financial, Personal relationships, Work / Programmer, multimedia, Armed forces operational officer  Sleep: restless  Appetite:  Fair  Current Medications: Current Facility-Administered Medications  Medication Dose Route Frequency Provider Last Rate Last Dose  . acetaminophen (TYLENOL) tablet 650 mg  650 mg Oral Q6H PRN Shuvon B Rankin, NP      . alum & mag hydroxide-simeth (MAALOX/MYLANTA) 200-200-20 MG/5ML suspension 30 mL  30 mL Oral Q4H PRN Shuvon B Rankin, NP      . ARIPiprazole (ABILIFY) tablet 5 mg  5 mg Oral Daily Eri Platten, MD   5 mg at 08/11/16 1159  . fluvoxaMINE (LUVOX) tablet 100 mg  100 mg Oral QHS Massiah Longanecker, MD      . hydrOXYzine (ATARAX/VISTARIL) tablet 25 mg  25  mg Oral Q6H PRN Tira Lafferty, MD      . LORazepam (ATIVAN) tablet 1 mg  1 mg Oral Q6H PRN Oneta Rack, NP   1 mg at 08/11/16 0754   Or  . LORazepam (ATIVAN) injection 1 mg  1 mg Intramuscular Q6H PRN Oneta Rack, NP   1 mg at 08/10/16 1730  . magnesium hydroxide (MILK OF MAGNESIA) suspension 30 mL  30 mL Oral Daily PRN Shuvon B Rankin, NP      . traZODone (DESYREL) tablet 100 mg  100 mg Oral QHS PRN Shuvon B Rankin, NP        Lab Results:  Results for orders placed or performed during the hospital encounter of 08/09/16 (from the past 48 hour(s))  CK total and CKMB (cardiac)not at [facility]     Status: None   Collection Time: 08/11/16  6:12 AM  Result Value Ref Range   Total CK 311 49 - 397 U/L   CK, MB 4.3 0.5 - 5.0 ng/mL   Relative Index 1.4 0.0 - 2.5    Comment: Performed at [facility]    Blood Alcohol level:  Lab Results  Component Value Date   [facility] <5 08/08/2016   ETH <5 07/18/2016    Metabolic Disorder Labs: Lab Results  Component Value Date   HGBA1C 5.2 07/15/2016   MPG 103 07/15/2016   No results found for: PROLACTIN Lab Results  Component Value Date   CHOL 187 07/15/2016   TRIG 86 07/15/2016   HDL 30 (L) 07/15/2016   CHOLHDL 6.2 07/15/2016   VLDL 17 07/15/2016   LDLCALC 140 (H) 07/15/2016    Physical Findings: AIMS: Facial and Oral Movements Muscles of Facial Expression: None, normal Lips and Perioral Area: None, normal Jaw: None, normal Tongue: None, normal,Extremity Movements Upper (arms, wrists, hands, fingers): None, normal Lower (legs, knees, ankles, toes): None, normal, Trunk Movements Neck, shoulders, hips: None, normal, Overall Severity Severity of abnormal movements (highest score from questions above): None, normal Incapacitation due to abnormal movements: None, normal Patient's awareness of abnormal movements (rate only patient's report): No Awareness, Dental Status Current problems with teeth and/or dentures?: No Does patient  usually wear dentures?: No  CIWA:    COWS:     Musculoskeletal: Strength & Muscle Tone: within normal limits Gait & Station: normal Patient leans: N/A  Psychiatric Specialty Exam: Physical Exam  Nursing note and vitals reviewed.   Review of Systems  Psychiatric/Behavioral: Positive for depression. The patient is nervous/anxious.   All other systems reviewed and are negative.   Blood pressure 107/67, pulse 79, temperature 98.4 F (36.9 C), temperature source Oral, resp. rate 16, height 5\' 9"  (1.753 m), weight 81.2 kg (179 lb), SpO2 98 %.Body mass index is 26.43 kg/m.  General Appearance: Fairly Groomed  Eye Contact:  Fair  Speech:  Clear and Coherent  Volume:  Decreased  Mood:  Anxious, Depressed and Dysphoric  Affect:  Tearful  Thought Process:  Goal Directed and Descriptions of Associations: Circumstantial  Orientation:  Full (Time, Place, and Person)  Thought Content:  Rumination  Suicidal Thoughts:  Yes.  without intent/plan  Homicidal Thoughts:  No  Memory:  Immediate;   Fair Recent;   Fair Remote;   Fair  Judgement:  Fair  Insight:  Fair  Psychomotor Activity:  Normal  Concentration:  Concentration: Fair and Attention Span: Fair  Recall:  [employer]Fair  Fund of Knowledge:  Fair  Language:  Fair  Akathisia:  No  Handed:  Right  AIMS (if indicated):     Assets:  Communication Skills Desire for Improvement  ADL's:  Intact  Cognition:  WNL  Sleep:  Number of Hours: 5.25     Treatment Plan Summary:Patient seen as depressed, his catatonia has resolved, he continues to be interested in ECT - will refer and continue treatment .  MDD (major depressive disorder), recurrent, with catatonic features (HCC) unstable  Will continue today 08/11/16 plan as below except where it is noted.   Daily contact with patient to assess and evaluate symptoms and progress in treatment and Medication management  Reviewed past medical records,treatment plan.   For depression: Restart  Luvox 100 mg po qhs. Abilify 5 mg po daily .  For insomnia: Trazodone 100 mg po qhs prn.  For anxiety/catatonic sx: Ativan 1 mg po/IM  q6 h prn.  Will continue to monitor vitals ,medication compliance and treatment side effects while patient is here.   Will monitor for medical issues as well as call consult as needed.   CSW will continue working on disposition. Referral for ECT since pt prefers it.  Patient to participate in therapeutic milieu .       Markeisha Mancias, MD 08/11/2016, 2:11 PM
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
## 2016-08-11 NOTE — B[facility] Suicide Risk Assessment (Signed)
[facility] INPATIENT:  Family/Significant Other Suicide Prevention Education  Suicide Prevention Education:  Patient Refusal for Family/Significant Other Suicide Prevention Education: The patient Benjamin [name] has refusCherly Hensened to provide written consent for family/significant other to be provided Family/Significant Other Suicide Prevention Education during admission and/or prior to discharge.  Physician notified.  Pt states he has no one locally that he relies on for support, his "best friend" lives in [place] and patient has limited contact w him.  States "everyone who needs to know I am here - my lawyer and the teacher who is taking care of my children."  Did not want CSW to contact any collaterals.  No access to guns or weapons.    [name] 08/11/2016, 10:14 AM

## 2016-08-11 NOTE — Progress Notes (Signed)
Recreation Therapy Notes  INPATIENT RECREATION THERAPY ASSESSMENT  Patient Details Name: Benjamin [name] MRN: [id number] DOB: Jun 13, 1981 Today's Date: 08/11/2016  Patient Stressors: Family  Pt stated the ambulance picked him up and brought him here. Pt stated what's going on with his children is his stressor.  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

## 2016-08-11 NOTE — Tx Team (Signed)
Interdisciplinary Treatment and Diagnostic Plan Update  08/11/2016 Time of Session: 3:30 PM  Benjamin [name] MRN: [id number]  Principal Diagnosis: MDD (major depressive disorder), recurrent, with catatonic features [facility](HCC)  Secondary Diagnoses: Principal Problem:   MDD (major depressive disorder), recurrent, with catatonic features (HCC)   Current Medications:  Current Facility-Administered Medications  Medication Dose Route Frequency Provider Last Rate Last Dose  . acetaminophen (TYLENOL) tablet 650 mg  650 mg Oral Q6H PRN Shuvon B Rankin, NP      . alum & mag hydroxide-simeth (MAALOX/MYLANTA) 200-200-20 MG/5ML suspension 30 mL  30 mL Oral Q4H PRN Shuvon B Rankin, NP      . ARIPiprazole (ABILIFY) tablet 5 mg  5 mg Oral Daily Saramma Eappen, MD   5 mg at 08/11/16 1159  . fluvoxaMINE (LUVOX) tablet 100 mg  100 mg Oral QHS Saramma Eappen, MD      . hydrOXYzine (ATARAX/VISTARIL) tablet 25 mg  25 mg Oral Q6H PRN Saramma Eappen, MD      . LORazepam (ATIVAN) tablet 1 mg  1 mg Oral Q6H PRN Oneta Rackanika N Lewis, NP   1 mg at 08/11/16 1440   Or  . LORazepam (ATIVAN) injection 1 mg  1 mg Intramuscular Q6H PRN Oneta Rackanika N Lewis, NP   1 mg at 08/10/16 1730  . magnesium hydroxide (MILK OF MAGNESIA) suspension 30 mL  30 mL Oral Daily PRN Shuvon B Rankin, NP      . traZODone (DESYREL) tablet 100 mg  100 mg Oral QHS PRN Shuvon B Rankin, NP        PTA Medications: Prescriptions Prior to Admission  Medication Sig Dispense Refill Last Dose  . ARIPiprazole (ABILIFY) 5 MG tablet Take 1 tablet (5 mg total) by mouth daily. 30 tablet 1 08/09/2016 at 1000  . fluvoxaMINE (LUVOX) 100 MG tablet Take 1 tablet (100 mg total) by mouth at bedtime. 30 tablet 1 Past Week at Unknown time  . LORazepam (ATIVAN) 2 MG tablet Take 1 tablet (2 mg total) by mouth every 6 (six) hours as needed for anxiety (catatonia). 90 tablet 0 08/08/2016 at Unknown time    Treatment Modalities: Medication Management, Group therapy, Case management,  1 to  1 session with clinician, Psychoeducation, Recreational therapy.   Physician Treatment Plan for Primary Diagnosis: MDD (major depressive disorder), recurrent, with catatonic features (HCC) Long Term Goal(s): Improvement in symptoms so as ready for discharge  Short Term Goals: Ability to identify changes in lifestyle to reduce recurrence of condition will improve Ability to demonstrate self-control will improve Ability to maintain clinical measurements within normal limits will improve Ability to identify changes in lifestyle to reduce recurrence of condition will improve Ability to verbalize feelings will improve Ability to identify and develop effective coping behaviors will improve Compliance with prescribed medications will improve  Medication Management: Evaluate patient's response, side effects, and tolerance of medication regimen.  Therapeutic Interventions: 1 to 1 sessions, Unit Group sessions and Medication administration.  Evaluation of Outcomes: Progressing  Physician Treatment Plan for Secondary Diagnosis: Principal Problem:   MDD (major depressive disorder), recurrent, with catatonic features (HCC)   Long Term Goal(s): Improvement in symptoms so as ready for discharge  Short Term Goals: Ability to identify changes in lifestyle to reduce recurrence of condition will improve Ability to demonstrate self-control will improve Ability to maintain clinical measurements within normal limits will improve Ability to identify changes in lifestyle to reduce recurrence of condition will improve Ability to verbalize feelings will improve Ability to identify and  develop effective coping behaviors will improve Compliance with prescribed medications will improve  Medication Management: Evaluate patient's response, side effects, and tolerance of medication regimen.  Therapeutic Interventions: 1 to 1 sessions, Unit Group sessions and Medication administration.  Evaluation of Outcomes:  Progressing   RN Treatment Plan for Primary Diagnosis: MDD (major depressive disorder), recurrent, with catatonic features (HCC) Long Term Goal(s): Knowledge of disease and therapeutic regimen to maintain health will improve  Short Term Goals: Ability to identify and develop effective coping behaviors will improve and Compliance with prescribed medications will improve  Medication Management: RN will administer medications as ordered by provider, will assess and evaluate patient's response and provide education to patient for prescribed medication. RN will report any adverse and/or side effects to prescribing provider.  Therapeutic Interventions: 1 on 1 counseling sessions, Psychoeducation, Medication administration, Evaluate responses to treatment, Monitor vital signs and CBGs as ordered, Perform/monitor CIWA, COWS, AIMS and Fall Risk screenings as ordered, Perform wound care treatments as ordered.  Evaluation of Outcomes: Progressing   LCSW Treatment Plan for Primary Diagnosis: MDD (major depressive disorder), recurrent, with catatonic features (HCC) Long Term Goal(s): Safe transition to appropriate next level of care at discharge, Engage patient in therapeutic group addressing interpersonal concerns.  Short Term Goals: Engage patient in aftercare planning with referrals and resources  Therapeutic Interventions: Assess for all discharge needs, 1 to 1 time with Social worker, Explore available resources and support systems, Assess for adequacy in community support network, Educate family and significant other(s) on suicide prevention, Complete Psychosocial Assessment, Interpersonal group therapy.  Evaluation of Outcomes: Progressing  Pt will return home and follow up outpt.  In meantime, is asking for a referral to [facility] for ECT, where he had received tx in the past.  CSW called and talked to [facility] to make referral   Progress in Treatment: Attending groups: Yes Participating in groups:  Yes Taking medication as prescribed: Yes Toleration medication: Yes, no side effects reported at this time Family/Significant other contact made: No Patient understands diagnosis: Yes AEB asking for help with anxiety Discussing patient identified problems/goals with staff: Yes Medical problems stabilized or resolved: Yes Denies suicidal/homicidal ideation: Yes Issues/concerns per patient self-inventory: None Other: N/A  New problem(s) identified: None identified at this time.   New Short Term/Long Term Goal(s): None identified at this time.   Discharge Plan or Barriers:   Reason for Continuation of Hospitalization: Anxiety  Depression Hallucinations  Medication stabilization   Estimated Length of Stay: 3-5 days  Attendees: Patient: 08/11/2016  3:30 PM  Physician: Jomarie Longs, MD 08/11/2016  3:30 PM  Nursing: Antonieta Iba, RN 08/11/2016  3:30 PM  RN Care Manager: Onnie Boer, RN 08/11/2016  3:30 PM  Social Worker: Richelle Ito 08/11/2016  3:30 PM  Recreational Therapist: Royston Cowper  08/11/2016  3:30 PM  Other: Tomasita Morrow 08/11/2016  3:30 PM  Other:  08/11/2016  3:30 PM    Scribe for Treatment Team:  Daryel Gerald LCSW 08/11/2016 3:30 PM

## 2016-08-11 NOTE — Plan of Care (Signed)
Problem: Safety: Goal: Periods of time without injury will increase Safety checks continues at Q 15 minutes intervals without incidents to note at present.  Problem: Nutritional: Goal: Ability to achieve adequate nutritional intake will improve Outcome: Progressing Pt tolerates all PO intake well. Per MHT ate 100% of lunch; observed drinking intervals during this shift (gingerale, water etc)

## 2016-08-11 NOTE — Progress Notes (Signed)
D: Pt A & O X3. Denies SI, HI, AVH and pain when assessed. Pt presents anxious with flat affect. Reported being concerned "about all my medications being stopped". Pt reported his depression 8/10, hopelessness 2/10 and anxiety 10/10. Pt observed to be guarded, restless and fidgety. Forwards during conversations. Pt's medications was restarted post MD's assessment. Pt was notified by assigned CSW [facility] his [facility] treatment and he was in agreement. A: Medications administered as ordered.  Encouragement and support provided to pt throughout this shift. Q 15 minutes safety checks maintained on and off unit without incident. R: Pt compliant with medications when offered. Denies adverse drug reactions. Attends scheduled unit groups. POC effective for mood stabilization and safety.

## 2016-08-11 NOTE — [facility] Group Notes (Signed)
[facility] LCSW Group Therapy  08/11/2016 , 3:30 PM   Type of Therapy:  Group Therapy  Participation Level:  Active  Participation Quality:  Attentive  Affect:  Appropriate  Cognitive:  Alert  Insight:  Improving  Engagement in Therapy:  Engaged  Modes of Intervention:  Discussion, Exploration and Socialization  Summary of Progress/Problems: Today's group focused on the term Diagnosis.  Participants were asked to define the term, and then pronounce whether it is a negative, positive or neutral term.  Invited.  Chose to not attend.  Daryel Geraldorth, Romaldo Saville B 08/11/2016 , 3:30 PM

## 2016-08-12 MED ORDER — ARIPIPRAZOLE 10 MG PO TABS
10.0000 mg | ORAL_TABLET | Freq: Every day | ORAL | Status: DC
  Administered 2016-08-13 – 2016-08-14 (×2): 10 mg via ORAL
  Filled 2016-08-12: qty 1
  Filled 2016-08-12: qty 7
  Filled 2016-08-12 (×2): qty 1

## 2016-08-12 MED ORDER — FLUVOXAMINE MALEATE 50 MG PO TABS
150.0000 mg | ORAL_TABLET | Freq: Every day | ORAL | Status: DC
  Administered 2016-08-12 – 2016-08-13 (×2): 150 mg via ORAL
  Filled 2016-08-12: qty 21
  Filled 2016-08-12 (×3): qty 3

## 2016-08-12 NOTE — Progress Notes (Signed)
[facility] MD Progress Note  08/12/2016 1:39 PM Benjamin [name]  MRN:  [id number] Subjective:  Pt states " I an worried about my children, I want to be there for them. I am still depressed, I wish I could get ECT.'   Objective: Benjamin [name] is a 17 y old CM , who has a hx of depression, catatonia , and ECT sessions in the past , who presented with catatonic behavior.  Patient seen and chart reviewed.Discussed patient with treatment team.  Pt today is seen as anxious, ruminative , focussed on getting ECT. Pt was declined for ECT by Dr.Clapac - this was discussed with pt . Pt became more anxious , restless after receiving this news. Pt is worried he will go back to catatonic state again without ECT. Pt advised that his medications can be increased and that he could be referred for out patient consult if needed for ECT. Pt encouraged to attend groups and participate.       Principal Problem: MDD (major depressive disorder), recurrent, with catatonic features (HCC) Diagnosis:   Patient Active Problem List   Diagnosis Date Noted  . MDD (major depressive disorder), recurrent, with catatonic features (HCC) [F33.9, F06.1] 08/09/2016  . Severe recurrent major depression with psychotic features (HCC) [F33.3] 07/21/2016  . MDD (major depressive disorder) [F32.9] 07/21/2016  . PTSD (post-traumatic stress disorder) [F43.10] 07/15/2016  . OCD (obsessive compulsive disorder) [F42.9] 07/15/2016  . 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 Bradycardia [R00.1] 09/18/2012  . Decreased oral intake [R63.8] 09/17/2012  . Psychosis [F29] 09/09/2012  . Severe episode of recurrent major depressive disorder, with psychotic features (HCC) [F33.3] 09/08/2012  . History of substance abuse [Z87.898] 09/08/2012   Total Time spent with patient: 25 minutes  Past Psychiatric  History: Please see H&P.   Past Medical History:  Past Medical History:  Diagnosis Date  . Depression   . Herniated disc    x3  . Hypertension   . Mental disorder     Past Surgical History:  Procedure Laterality Date  . SHOULDER SURGERY     Family History: Please see H&P.  Family Psychiatric  History: Please see H&P.  Social History: Please see H&P.  History  Alcohol Use No     History  Drug Use No    Social History   Social History  . Marital status: Single    Spouse name: N/A  . Number of children: N/A  . Years of education: N/A   Social History Main Topics  . Smoking status: Former Smoker    Packs/day: 1.00    Types: Cigarettes    Quit date: 04/04/2012  . Smokeless tobacco: Never Used  . Alcohol use No  . Drug use: No  . Sexual activity: No   Other Topics Concern  . None   Social History Narrative  . 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   Negative Consequences of Use: Financial, Personal relationships, Work / Programmer, multimedia, Armed forces operational officer                    Sleep: restless  Appetite:  Fair  Current Medications: Current Facility-Administered Medications  Medication Dose Route Frequency Provider  Last Rate Last Dose  . acetaminophen (TYLENOL) tablet 650 mg  650 mg Oral Q6H PRN Shuvon B Rankin, NP      . alum & mag hydroxide-simeth (MAALOX/MYLANTA) 200-200-20 MG/5ML suspension 30 mL  30 mL Oral Q4H PRN Shuvon B Rankin, NP      . [START ON 08/13/2016] ARIPiprazole (ABILIFY) tablet 10 mg  10 mg Oral Daily Alexica Schlossberg, MD      . fluvoxaMINE (LUVOX) tablet 150 mg  150 mg Oral QHS Ercilia Bettinger, MD      . hydrOXYzine (ATARAX/VISTARIL) tablet 25 mg  25 mg Oral Q6H PRN Philana Younis, MD      . LORazepam (ATIVAN) tablet 1 mg  1 mg Oral Q6H PRN Oneta Rackanika N Lewis, NP    1 mg at 08/12/16 16100812   Or  . LORazepam (ATIVAN) injection 1 mg  1 mg Intramuscular Q6H PRN Oneta Rackanika N Lewis, NP   1 mg at 08/10/16 1730  . magnesium hydroxide (MILK OF MAGNESIA) suspension 30 mL  30 mL Oral Daily PRN Shuvon B Rankin, NP      . traZODone (DESYREL) tablet 100 mg  100 mg Oral QHS PRN Shuvon B Rankin, NP   100 mg at 08/11/16 2050    Lab Results:  Results for orders placed or performed during the hospital encounter of 08/09/16 (from the past 48 hour(s))  CK total and CKMB (cardiac)not at [facility]     Status: None   Collection Time: 08/11/16  6:12 AM  Result Value Ref Range   Total CK 311 49 - 397 U/L   CK, MB 4.3 0.5 - 5.0 ng/mL   Relative Index 1.4 0.0 - 2.5    Comment: Performed [facility]    Blood Alcohol level:  Lab Results  Component Value Date   [facility] 08/08[facility]2016   ETH <5 07/18/2016    Metabolic Disorder Labs: Lab Results  Component Value Date   HGBA1C 5.2 07/15/2016   MPG 103 07/15/2016   No results found for: PROLACTIN Lab Results  Component Value Date   CHOL 187 07/15/2016   TRIG 86 07/15/2016   HDL 30 (L) 07/15/2016   CHOLHDL 6.2 07/15/2016   VLDL 17 07/15/2016   LDLCALC 140 (H) 07/15/2016    Physical Findings: AIMS: Facial and Oral Movements Muscles of Facial Expression: None, normal Lips and Perioral Area: None, normal Jaw: None, normal Tongue: None, normal,Extremity Movements Upper (arms, wrists, hands, fingers): None, normal Lower (legs, knees, ankles, toes): None, normal, Trunk Movements Neck, shoulders, hips: None, normal, Overall Severity Severity of abnormal movements (highest score from questions above): None, normal Incapacitation due to abnormal movements: None, normal Patient's awareness of abnormal movements (rate only patient's report): No Awareness, Dental Status Current problems with teeth and/or dentures?: No Does patient usually wear dentures?: No  CIWA:    COWS:     Musculoskeletal: Strength & Muscle Tone:  within normal limits Gait & Station: normal Patient leans: N/A  Psychiatric Specialty Exam: Physical Exam  Nursing note and vitals reviewed.   Review of Systems  Psychiatric/Behavioral: Positive for depression. The patient is nervous/anxious.   All other systems reviewed and are negative.   Blood pressure 106/66, pulse 76, temperature 98.7 F (37.1 C), resp. rate 20, height 5\' 9"  (1.753 m), weight 81.2 kg (179 lb), SpO2 98 %.Body mass index is 26.43 kg/m.  General Appearance: Fairly Groomed  Eye Contact:  Fair  Speech:  Clear and Coherent  Volume:  Decreased  Mood:  Anxious, Depressed and Dysphoric  Affect:  Tearful  Thought Process:  Goal Directed and Descriptions of Associations: Circumstantial  Orientation:  Full (Time, Place, and Person)  Thought Content:  Rumination  Suicidal Thoughts:  Yes.  without intent/plan  Homicidal Thoughts:  No  Memory:  Immediate;   Fair Recent;   Fair Remote;   Fair  Judgement:  Fair  Insight:  Fair  Psychomotor Activity:  Normal  Concentration:  Concentration: Fair and Attention Span: Fair  Recall:  [employer] of Knowledge:  Fair  Language:  Fair  Akathisia:  No  Handed:  Right  AIMS (if indicated):     Assets:  Communication Skills Desire for Improvement  ADL's:  Intact  Cognition:  WNL  Sleep:  Number of Hours: 6.5     Treatment Plan Summary:Patient seen as depressed, s anxious- continue treatment.   MDD (major depressive disorder), recurrent, with catatonic features (HCC) unstable  Will continue today 08/12/16 plan as below except where it is noted.   Daily contact with patient to assess and evaluate symptoms and progress in treatment and Medication management  Reviewed past medical records,treatment plan.   For depression: Increase Luvox to 150 mg po qhs. Increase Abilify to 10  mg po daily .  For insomnia: Trazodone 100 mg po qhs prn.  For anxiety/catatonic sx: Ativan 1 mg po/IM  q6 h prn.  Will continue to  monitor vitals ,medication compliance and treatment side effects while patient is here.   Will monitor for medical issues as well as call consult as needed.   CSW will continue working on disposition. Referral for ECT made - pt was declined.  Patient to participate in therapeutic milieu .       Bayley Hurn, MD 08/12/2016, 1:39 PM

## 2016-08-12 NOTE — Progress Notes (Signed)
Recreation Therapy Notes  Date: 08/12/16 Time: 1000 Location: [address] Dayroom  Group Topic: Self-Esteem  Goal Area(s) Addresses:  Patient will identify positive ways to increase self-esteem. Patient will verbalize benefit of increased self-esteem.  Behavioral Response: Engaged  Intervention: Blank masks, markers  Activity: How I see Me.  Patients were given [name] worksheet with [name] blank mask on it.  On the mask, patiens were to draw or write how they feel others see them.  On the back of the sheet, patients were to write or draw how they see themselves.  Education:  Self-Esteem, Building control surveyorDischarge Planning.   Education Outcome: Acknowledges education/In group clarification offered/Needs additional education  Clinical Observations/Feedback: Pt didn't complete [name] sheet but was very active in the discussion.  In response to [name] peer talking about their brother, pt stated that the peer was bothered by his brother's opinion of him.  Pt went on to say that "family's opinions mean the most in some cases because they are closest to you".  Pt also stated "family opinions can hurt the most as well at times".   Caroll RancherMarjette Debany [name], LRT/CTRS     Caroll RancherLindsay, Benjamin [name] 08/12/2016 12:01 PM

## 2016-08-12 NOTE — Progress Notes (Signed)
Patient denies SI, HI and AVH. Patient reported increased anxiety and depression.  Patient states that he is interested in ECT because that has worked for him in the pass and he wants to be able decrease his catatonic states.  Patient has had no incidents of self injury and is able to contract for safety   Assess patient for safety, offer medications as prescribed, engage patient in 1:1 staff talks.   Continue to monitor as planned.

## 2016-08-12 NOTE — Progress Notes (Signed)
Alric Seton. Denney had been up and visible in milieu this evening, interacting appropriately with peers in milieu. Ivin BootyJoshua spoke about how he is hopeful to be able to be [facility] to be able to receive ECT treatment. Ivin BootyJoshua reports that he is feeling better, denies SI this evening, did receive bedtime medications without incident and did not verbalize any complaints of pain. A. Support and encouragement provided. R. Safety maintained, will continue to monitor.

## 2016-08-12 NOTE — [facility] Group Notes (Signed)
[facility] LCSW Group Therapy  08/12/2016 1:15 pm  Type of Therapy: Process Group Therapy  Participation Level:  Active  Participation Quality:  Appropriate  Affect:  Flat  Cognitive:  Oriented  Insight:  Improving  Engagement in Group:  Limited  Engagement in Therapy:  Limited  Modes of Intervention:  Activity, Clarification, Education, Problem-solving and Support  Summary of Progress/Problems: Today's group addressed the issue of overcoming obstacles.  Patients were asked to identify their biggest obstacle post d/c that stands in the way of their on-going success, and then problem solve as to how to manage this. Stayed the entire time, engaged throughout.  Seemed reluctant to engage initially, but soon warmed up.  Lots of spontaneous, good feedback for others.  Identified that his biggest obstacle is his recent past relationship, now defunct, with the mother of his children, because it left him "inadvertently isolated from others."  Went on to talk about working to discover the truth, that the world is not black and white like others want him to believe, and that his faith is very important to him, as are his children.  Daryel Geraldorth, Steaven Wholey B 08/12/2016   2:48 PM

## 2016-08-12 NOTE — Progress Notes (Signed)
Recreation Therapy Notes  Animal-Assisted Activity (AAA) Program Checklist/Progress Notes Patient Eligibility Criteria Checklist & Daily Group note for Rec Tx Intervention  Date: 01.09.2018 Time: 2:45pm [address]: [address]    AAA/T Program Assumption of Risk Form signed by Patient/ or Parent Legal Guardian Yes  Patient is free of allergies or sever asthma Yes  Patient reports no fear of animals Yes  Patient reports no history of cruelty to animals Yes  Patient understands his/her participation is voluntary Yes  Patient washes hands before animal contact Yes  Patient washes hands after animal contact Yes  Behavioral Response: Engaged, Appropriate   Education: Charity fundraiserHand Washing, Appropriate Animal Interaction   Education Outcome: Acknowledges education.   Clinical Observations/Feedback: Patient discussed with MD for appropriateness in pet therapy session. Both LRT and MD agree patient is appropriate for participation. Patient offered participation in session and signed necessary consent form without issue. Patient attended session for approximatley 35 minutes, petting therapy dog appropriately, asking appropriate questions about therapy dog and interacting appropriately with peers.   Marykay Lexenise L Briona Korpela, LRT/CTRS        Khloie Hamada L 08/12/2016 3:17 PM

## 2016-08-13 NOTE — Progress Notes (Signed)
Adult Psychoeducational Group Note  Date:  08/13/2016 Time:  10:24 PM  Group Topic/Focus:  Wrap-Up Group:   The focus of this group is to help patients review their daily goal of treatment and discuss progress on daily workbooks.   Participation Level:  Active  Participation Quality:  Appropriate  Affect:  Appropriate  Cognitive:  Appropriate  Insight: Appropriate  Engagement in Group:  Engaged  Modes of Intervention:  Socialization and Support  Additional Comments:  Patient attended and participated in group tonight. He reports having an OK day. He had a visit with his kids which made his day.  He went to group and meals. He spoke with his doctor. Lita MainsFrancis, [place] [facility] [facility] 08/13/2016, 10:24 PMAdult Psychoeducational Group Note  Date:  08/13/2016 Time:  10:23 PM  Group Topic/Focus:  Wrap-Up Group:   The focus of this group is to help patients review their daily goal of treatment and discuss progress on daily workbooks.   Participation Level:  Active  Participation Quality:  Appropriate  Affect:  Appropriate  Cognitive:  Appropriate  Insight: Appropriate  Engagement in Group:  Engaged  Modes of Intervention:  Socialization and Support  Additional Comments:  See above notes Scot DockFrancis, Francyne Arreaga Dacosta 08/13/2016, 10:23 PM

## 2016-08-13 NOTE — Progress Notes (Signed)
Recreation Therapy Notes  Date: 08/13/16 Time: 1000 Location: 500 Hall Dayroom  Group Topic: Communication, Team Building, Problem Solving  Goal Area(s) Addresses:  Patient will effectively work with peer towards shared goal.  Patient will identify skills used to make activity successful.  Patient will identify how skills used during activity can be used to reach post d/c goals.   Behavioral Response: Engaged  Intervention: STEM Activity  Activity: Stage managerLanding Pad. In teams patients were given 12 plastic drinking straws and a length of masking tape. Using the materials provided patients were asked to build a landing pad to catch a golf ball dropped from approximately 6 feet in the air.   Education: Pharmacist, communityocial Skills, Discharge Planning   Education Outcome: Acknowledges education/In group clarification offered/Needs additional education.   Clinical Observations/Feedback:  Pt worked well with group and showed good leadership.  Pt expressed they used teamwork and problem solving to complete the activity.  Pt stated if he uses the skills needed to complete this activity, he could "get a different perspective of things".   Caroll RancherMarjette Devynne Sturdivant, LRT/CTRS       Caroll RancherLindsay, Cara Aguino A 08/13/2016 12:09 PM

## 2016-08-13 NOTE — Progress Notes (Signed)
Patient in bed laying in be d during this assessment,. He appeared pleasant on approach. Patient reported that his children came to visit him and that really made his day.; " I feel blessed and the visit made my day". Patient denied SI/HI and denied Hallucination. Writer encouraged and supported patient. Q 15 minute check continues as ordered for safety.

## 2016-08-13 NOTE — Progress Notes (Signed)
[facility] MD Progress Note  08/13/2016 3:20 PM Benjamin [name]  MRN:  [id number] Subjective:  Pt states " I am still depressed, my catatonia seems to be coming back.'    Objective: Benjamin [name] is a 7 y old CM , who has a hx of depression, catatonia , and ECT sessions in the past , who presented with catatonic behavior.  Patient seen and chart reviewed.Discussed patient with treatment team.  Pt today continues to be seen as preoccupied with his stressors, especially his kids. Pt reports he feels like he may go in to catatonia again and is worried. Pt has been tolerating his medications well. Pt agrees to increasing his medication dose today, continue to encourage and support. Per nursing pt seen in milieu, participate often.      Principal Problem: MDD (major depressive disorder), recurrent, with catatonic features (HCC) Diagnosis:   Patient Active Problem List   Diagnosis Date Noted  . MDD (major depressive disorder), recurrent, with catatonic features (HCC) [F33.9, F06.1] 08/09/2016  . Severe recurrent major depression with psychotic features (HCC) [F33.3] 07/21/2016  . MDD (major depressive disorder) [F32.9] 07/21/2016  . PTSD (post-traumatic stress disorder) [F43.10] 07/15/2016  . OCD (obsessive compulsive disorder) [F42.9] 07/15/2016  . 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 Bradycardia [R00.1] 09/18/2012  . Decreased oral intake [R63.8] 09/17/2012  . Psychosis [F29] 09/09/2012  . Severe episode of recurrent major depressive disorder, with psychotic features (HCC) [F33.3] 09/08/2012  . History of substance abuse [Z87.898] 09/08/2012   Total Time spent with patient: 25 minutes  Past Psychiatric History: Please see H&P.   Past Medical History:  Past Medical History:  Diagnosis Date  . Depression   . Herniated disc    x3  .  Hypertension   . Mental disorder     Past Surgical History:  Procedure Laterality Date  . SHOULDER SURGERY     Family History: Please see H&P.  Family Psychiatric  History: Please see H&P.  Social History: Please see H&P.  History  Alcohol Use No     History  Drug Use No    Social History   Social History  . Marital status: Single    Spouse name: N/A  . Number of children: N/A  . Years of education: N/A   Social History Main Topics  . Smoking status: Former Smoker    Packs/day: 1.00    Types: Cigarettes    Quit date: 04/04/2012  . Smokeless tobacco: Never Used  . Alcohol use No  . Drug use: No  . Sexual activity: No   Other Topics Concern  . None   Social History Narrative  . 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   Negative Consequences of Use: Financial, Personal relationships, Work / Programmer, multimedia, Armed forces operational officer                    Sleep: Fair  Appetite:  Fair  Current Medications: Current Facility-Administered Medications  Medication Dose Route Frequency Provider Last Rate Last Dose  . acetaminophen (TYLENOL) tablet 650 mg  650 mg Oral Q6H PRN Shuvon B Rankin, NP      . alum & mag hydroxide-simeth (MAALOX/MYLANTA) 200-200-20  MG/5ML suspension 30 mL  30 mL Oral Q4H PRN Shuvon B Rankin, NP      . ARIPiprazole (ABILIFY) tablet 10 mg  10 mg Oral Daily Jomarie Longs, MD   10 mg at 08/13/16 0756  . fluvoxaMINE (LUVOX) tablet 150 mg  150 mg Oral QHS Jomarie Longs, MD   150 mg at 08/12/16 2204  . hydrOXYzine (ATARAX/VISTARIL) tablet 25 mg  25 mg Oral Q6H PRN Jomarie Longs, MD   25 mg at 08/12/16 2204  . LORazepam (ATIVAN) tablet 1 mg  1 mg Oral Q6H PRN Oneta Rack, NP   1 mg at 08/13/16 1222   Or  . LORazepam (ATIVAN) injection 1 mg  1 mg Intramuscular Q6H PRN  Oneta Rack, NP   1 mg at 08/10/16 1730  . magnesium hydroxide (MILK OF MAGNESIA) suspension 30 mL  30 mL Oral Daily PRN Shuvon B Rankin, NP      . traZODone (DESYREL) tablet 100 mg  100 mg Oral QHS PRN Shuvon B Rankin, NP   100 mg at 08/12/16 2204    Lab Results:  No results found for this or any previous visit (from the past 48 hour(s)).  Blood Alcohol level:  Lab Results  Component Value Date   ETH <5 08/08/2016   ETH <5 07/18/2016    Metabolic Disorder Labs: Lab Results  Component Value Date   HGBA1C 5.2 07/15/2016   MPG 103 07/15/2016   No results found for: PROLACTIN Lab Results  Component Value Date   CHOL 187 07/15/2016   TRIG 86 07/15/2016   HDL 30 (L) 07/15/2016   CHOLHDL 6.2 07/15/2016   VLDL 17 07/15/2016   LDLCALC 140 (H) 07/15/2016    Physical Findings: AIMS: Facial and Oral Movements Muscles of Facial Expression: None, normal Lips and Perioral Area: None, normal Jaw: None, normal Tongue: None, normal,Extremity Movements Upper (arms, wrists, hands, fingers): None, normal Lower (legs, knees, ankles, toes): None, normal, Trunk Movements Neck, shoulders, hips: None, normal, Overall Severity Severity of abnormal movements (highest score from questions above): None, normal Incapacitation due to abnormal movements: None, normal Patient's awareness of abnormal movements (rate only patient's report): No Awareness, Dental Status Current problems with teeth and/or dentures?: No Does patient usually wear dentures?: No  CIWA:    COWS:     Musculoskeletal: Strength & Muscle Tone: within normal limits Gait & Station: normal Patient leans: N/A  Psychiatric Specialty Exam: Physical Exam  Nursing note and vitals reviewed.   Review of Systems  Psychiatric/Behavioral: Positive for depression. The patient is nervous/anxious.   All other systems reviewed and are negative.   Blood pressure 100/71, pulse 85, temperature 98.2 F (36.8 C), resp. rate 18, height  5\' 9"  (1.753 m), weight 81.2 kg (179 lb), SpO2 98 %.Body mass index is 26.43 kg/m.  General Appearance: Fairly Groomed  Eye Contact:  Fair  Speech:  Clear and Coherent  Volume:  Decreased  Mood:  Anxious, Depressed and Dysphoric  Affect:  Depressed  Thought Process:  Goal Directed and Descriptions of Associations: Circumstantial  Orientation:  Full (Time, Place, and Person)  Thought Content:  Rumination  Suicidal Thoughts:  Yes.  without intent/plan on and off  Homicidal Thoughts:  No  Memory:  Immediate;   Fair Recent;   Fair Remote;   Fair  Judgement:  Fair  Insight:  Fair  Psychomotor Activity:  Normal  Concentration:  Concentration: Fair and Attention Span: Fair  Recall:  [employer] of Knowledge:  Fair  Language:  Fair  Akathisia:  No  Handed:  Right  AIMS (if indicated):     Assets:  Communication Skills Desire for Improvement  ADL's:  Intact  Cognition:  WNL  Sleep:  Number of Hours: 6.75     Treatment Plan Summary:Patient today seen as depressed, worried that he will become catatonic soon, preoccupied with getting ECT , however was declined after ECT consult - continue to readjust medications.    MDD (major depressive disorder), recurrent, with catatonic features (HCC) unstable  Will continue today 08/13/16 plan as below except where it is noted.   Daily contact with patient to assess and evaluate symptoms and progress in treatment and Medication management  Reviewed past medical records,treatment plan.   For depression: Increased Luvox to 150 mg po qhs. Will increase Abilify to 10  mg po daily .  For insomnia: Trazodone 100 mg po qhs prn.  For anxiety/catatonic sx: Ativan 1 mg po/IM  q6 h prn.  Will continue to monitor vitals ,medication compliance and treatment side effects while patient is here.   Will monitor for medical issues as well as call consult as needed.   CSW will continue working on disposition. Referral for ECT made - pt was  declined.  Patient to participate in therapeutic milieu .       Hiroyuki Ozanich, MD 08/13/2016, 3:20 PM

## 2016-08-13 NOTE — [facility] Group Notes (Signed)
[facility] LCSW Group Therapy  08/13/2016 2:42 PM   Type of Therapy:  Group Therapy   Participation Level:  Engaged  Participation Quality:  Attentive  Affect:  Appropriate   Cognitive:  Alert   Insight:  Engaged  Engagement in Therapy:  Improving   Modes of Intervention:  Education, Exploration, Socialization   Summary of Progress/Problems: Benjamin [name] was in and out of group initially, and then eventually left the group session without returning.   Benjamin [name] from [facility] [employer] was here to tell his story of recovery, inform patients about [facility] and play his guitar.   Benjamin [name] 08/13/2016 2:42 PM

## 2016-08-13 NOTE — Plan of Care (Signed)
Problem: Education: Goal: Ability to state activities that reduce stress will improve Outcome: Progressing Nurse discussed anxiety/coping skills with patient.    

## 2016-08-13 NOTE — Progress Notes (Signed)
D:  Patient denied SI and HI this morning.  Stated he does hear voices, mumblings.  Denied visual hallucinations. A:  Medications administered per MD orders.  Emotional support and encouragement given patient. R:  Safety maintained with 15 minute checks.

## 2016-08-13 NOTE — Progress Notes (Signed)
D: Pt is flat isolative and withdrawn to self even while in the day room. Pt at the time of assessment endorses moderate anxiety and depression; states, "I couldn't see my son today; hopefully he will show up tomorrow." Pt denies AVH, SI, HI or pain. Pt was a little tense. A: Medications offered as prescribed.  Support, encouragement, and safe environment provided.  15-minute safety checks continue. R: Pt was med compliant.  Pt did attend group. Safety checks continue.

## 2016-08-14 MED ORDER — HYDROXYZINE HCL 25 MG PO TABS: 25.0000 mg | ORAL_TABLET | Freq: Four times a day (QID) | ORAL | 0 refills | Status: DC | PRN

## 2016-08-14 MED ORDER — FLUVOXAMINE MALEATE 50 MG PO TABS
150.0000 mg | ORAL_TABLET | Freq: Every day | ORAL | 0 refills | Status: DC
Start: 2016-08-14 — End: 2017-01-16

## 2016-08-14 MED ORDER — LORAZEPAM 1 MG PO TABS: 1.0000 mg | ORAL_TABLET | Freq: Two times a day (BID) | ORAL | Status: DC | PRN

## 2016-08-14 MED ORDER — LORAZEPAM 2 MG/ML IJ SOLN: 1.0000 mg | Freq: Two times a day (BID) | INTRAMUSCULAR | Status: DC | PRN

## 2016-08-14 MED ORDER — ARIPIPRAZOLE 10 MG PO TABS: 10.0000 mg | ORAL_TABLET | Freq: Every day | ORAL | 0 refills | Status: DC

## 2016-08-14 MED ORDER — TRAZODONE HCL 100 MG PO TABS: 100.0000 mg | ORAL_TABLET | Freq: Every evening | ORAL | 0 refills | Status: DC | PRN

## 2016-08-14 NOTE — Discharge Summary (Signed)
Physician Discharge Summary Note  Patient:  Benjamin [name] is an 36 y.o., male MRN:  [id number] DOB:  [birth year] Patient phone:  [phone] (home)  Patient address:   [address] [place] [address],  Total Time spent with patient: 30 minutes  Date of Admission:  08/09/2016 Date of Discharge: 08/14/2016  Reason for Admission:    Principal Problem: MDD (major depressive disorder), recurrent, with catatonic features [facility]) Discharge Diagnoses: Patient Active Problem List   Diagnosis Date Noted  . MDD (major depressive disorder), recurrent, with catatonic features (HCC) [F33.9, F06.1] 08/09/2016  . Severe recurrent major depression with psychotic features (HCC) [F33.3] 07/21/2016  . MDD (major depressive disorder) [F32.9] 07/21/2016  . PTSD (post-traumatic stress disorder) [F43.10] 07/15/2016  . OCD (obsessive compulsive disorder) [F42.9] 07/15/2016  . 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 Bradycardia [R00.1] 09/18/2012  . Decreased oral intake [R63.8] 09/17/2012  . Psychosis [F29] 09/09/2012  . Severe episode of recurrent major depressive disorder, with psychotic features (HCC) [F33.3] 09/08/2012  . History of substance abuse [Z87.898] 09/08/2012    Past Psychiatric History: see HPI  Past Medical History:  Past Medical History:  Diagnosis Date  . Depression   . Herniated disc    x3  . Hypertension   . Mental disorder     Past Surgical History:  Procedure Laterality Date  . SHOULDER SURGERY     Family History: History reviewed. No pertinent family history. Family Psychiatric  History: see HPI Social History:  History  Alcohol Use No     History  Drug Use No    Social History   Social History  . Marital status: Single    Spouse name: N/A  . Number of children: N/A  . Years of education: N/A    Social History Main Topics  . Smoking status: Former Smoker    Packs/day: 1.00    Types: Cigarettes    Quit date: 04/04/2012  . Smokeless tobacco: Never Used  . Alcohol use No  . Drug use: No  . Sexual activity: No   Other Topics Concern  . None   Social History Narrative  . None    Hospital Course:  Benjamin [name] an 35 y.o.male.  Per IVC the patient was stated have been fasting for 6 days and panic attacks.    Benjamin [name] was admitted for MDD (major depressive disorder), recurrent, with catatonic features (HCC) and crisis management.  Patient was treated with medications with their indications listed below in detail under Medication List.  Medical problems were identified and treated as needed.  Home medications were restarted as appropriate.  Improvement was monitored by observation and Benjamin [name] daily report of symptom reduction.  Emotional and mental status was monitored by daily self inventory reports completed by Benjamin [name] and clinical staff.  Patient reported continued improvement, denied any new concerns.  Patient had been compliant on medications and denied side effects.  Support and encouragement was provided.         Benjamin [name] was evaluated by the treatment team for stability and plans for continued recovery upon discharge.  Patient was offered further treatment options upon discharge including Residential, Intensive Outpatient and Outpatient treatment. Patient will follow up with agency listed below for medication management and counseling.  Encouraged patient to maintain satisfactory support network and home environment.  Advised to adhere to medication compliance  and outpatient treatment follow up.  Prescriptions provided.       Benjamin [name] motivation was an integral factor for scheduling further treatment.  Employment, transportation, bed availability, health status, family support, and any pending legal issues were also considered during patient's  hospital stay.  Upon completion of this admission the patient was both mentally and medically stable for discharge denying suicidal/homicidal ideation, auditory/visual/tactile hallucinations, delusional thoughts and paranoia.      Physical Findings: AIMS: Facial and Oral Movements Muscles of Facial Expression: None, normal Lips and Perioral Area: None, normal Jaw: None, normal Tongue: None, normal,Extremity Movements Upper (arms, wrists, hands, fingers): None, normal Lower (legs, knees, ankles, toes): None, normal, Trunk Movements Neck, shoulders, hips: None, normal, Overall Severity Severity of abnormal movements (highest score from questions above): None, normal Incapacitation due to abnormal movements: None, normal Patient's awareness of abnormal movements (rate only patient's report): No Awareness, Dental Status Current problems with teeth and/or dentures?: No Does patient usually wear dentures?: No  CIWA:  CIWA-Ar Total: 1 COWS:  COWS Total Score: 1  Musculoskeletal: Strength & Muscle Tone: within normal limits Gait & Station: normal Patient leans: N/A  Psychiatric Specialty Exam:  SEE MD SRA Physical Exam  Nursing note and vitals reviewed.   Review of Systems  Constitutional: Negative.   HENT: Negative.   Eyes: Negative.   Respiratory: Negative.   Cardiovascular: Negative.   Gastrointestinal: Negative.   Genitourinary: Negative.   Musculoskeletal: Negative.   Skin: Negative.   Neurological: Negative.   Endo/Heme/Allergies: Negative.   Psychiatric/Behavioral: Negative.   All other systems reviewed and are negative.   Blood pressure 109/74, pulse 94, temperature 98.5 F (36.9 C), temperature source Oral, resp. rate 16, height 5\' 9"  (1.753 m), weight 81.2 kg (179 lb), SpO2 98 %.Body mass index is 26.43 kg/m.    Have you used any form of tobacco in the last 30 days? (Cigarettes, Smokeless Tobacco, Cigars, and/or Pipes): No  Has this patient used any form of tobacco  in the last 30 days? (Cigarettes, Smokeless Tobacco, Cigars, and/or Pipes) Yes, No  Blood Alcohol level:  Lab Results  Component Value Date   ETH <5 08/08/2016   ETH <5 07/18/2016    Metabolic Disorder Labs:  Lab Results  Component Value Date   HGBA1C 5.2 07/15/2016   MPG 103 07/15/2016   No results found for: PROLACTIN Lab Results  Component Value Date   CHOL 187 07/15/2016   TRIG 86 07/15/2016   HDL 30 (L) 07/15/2016   CHOLHDL 6.2 07/15/2016   VLDL 17 07/15/2016   LDLCALC 140 (H) 07/15/2016    See Psychiatric Specialty Exam and Suicide Risk Assessment completed by Attending Physician prior to discharge.  Discharge destination:  Home  Is patient on multiple antipsychotic therapies at discharge:  No   Has Patient had three or more failed trials of antipsychotic monotherapy by history:  No  Recommended Plan for Multiple Antipsychotic Therapies: NA   Allergies as of 08/14/2016   No Known Allergies     Medication List    STOP taking these medications   LORazepam 2 MG tablet Commonly known as:  ATIVAN     TAKE these medications     Indication  ARIPiprazole 10 MG tablet Commonly known as:  ABILIFY Take 1 tablet (10 mg total) by mouth daily. Start taking on:  08/15/2016 What changed:  medication strength  how much to take  Indication:  Major Depressive Disorder   fluvoxaMINE 50 MG tablet Commonly known as:  LUVOX Take 3 tablets (150 mg total) by mouth at bedtime. What changed:  medication strength  how much to take  Indication:  Depression   hydrOXYzine 25 MG tablet Commonly known as:  ATARAX/VISTARIL Take 1 tablet (25 mg total) by mouth every 6 (six) hours as needed for anxiety.  Indication:  Anxiety Neurosis   traZODone 100 MG tablet Commonly known as:  DESYREL Take 1 tablet (100 mg total) by mouth at bedtime as needed for sleep.  Indication:  Trouble Sleeping      Follow-up Information    [facility] Follow up.   Specialty:  [facility] Contact information: [address] [place] [address] [phone]          Follow-up recommendations:  Activity:  as tol Diet:  as tol  Comments:  1.  Take all your medications as prescribed.   2.  Report any adverse side effects to outpatient provider. 3.  Patient instructed to not use alcohol or illegal drugs while on prescription medicines. 4.  In the event of worsening symptoms, instructed patient to call 911, the crisis hotline or go to nearest emergency room for evaluation of symptoms.  Signed: Lindwood QuaSheila May Caeleigh Prohaska, NP [facility] 08/14/2016, 10:46 AM

## 2016-08-14 NOTE — Progress Notes (Signed)
  [facility] :  Will you be returning to the same living situation after discharge:  Yes,  home At discharge, do you have transportation home?: Yes,  says he'll walk Do you have the ability to pay for your medications: Yes,  mental health  Release of information consent forms completed and in the chart;  Patient's signature needed at discharge.  Patient to Follow up at: Follow-up Information    [facility] Follow up.   Specialty:  Behavioral Health Why:  Go to the walk-in clinic within the next 5 business days, M-F between 8 and 11AM, for your hospital follow up appointment Contact information: [address] [place] [address] [phone]        [facility] Follow up on 09/16/2016.   Specialty:  Professional Counselor Why:  Tuesday for your therapy appointment.  They can also see you for medication management as well.  Just let the therapist know you want to [facility] Contact information: [facility] [address] [place] [address] [phone]           Next level of care provider has access to [facility] Link:no  Safety Planning and Suicide Prevention discussed: Yes,  yes  Have you used any form of tobacco in the last 30 days? (Cigarettes, Smokeless Tobacco, Cigars, and/or Pipes): No  Has patient been referred to the Quitline?: N/A patient is not a smoker  Patient has been referred for addiction treatment: N/A  Ida RogueRodney B Baily Serpe 08/14/2016, 4:23 PM

## 2016-08-14 NOTE — BHH Suicide Risk Assessment (Signed)
[facility]   Principal Problem: MDD (major depressive disorder), recurrent, with catatonic features [facility](HCC) Discharge Diagnoses:  Patient Active Problem List   Diagnosis Date Noted  . MDD (major depressive disorder), recurrent, with catatonic features (HCC) [F33.9, F06.1] 08/09/2016  . Severe recurrent major depression with psychotic features (HCC) [F33.3] 07/21/2016  . MDD (major depressive disorder) [F32.9] 07/21/2016  . PTSD (post-traumatic stress disorder) [F43.10] 07/15/2016  . OCD (obsessive compulsive disorder) [F42.9] 07/15/2016  . 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 Bradycardia [R00.1] 09/18/2012  . Decreased oral intake [R63.8] 09/17/2012  . Psychosis [F29] 09/09/2012  . Severe episode of recurrent major depressive disorder, with psychotic features (HCC) [F33.3] 09/08/2012  . History of substance abuse [Z87.898] 09/08/2012    Total Time spent with patient: 30 minutes  Musculoskeletal: Strength & Muscle Tone: within normal limits Gait & Station: normal Patient leans: N/A  Psychiatric Specialty Exam: Review of Systems  Psychiatric/Behavioral: Negative for depression, hallucinations and suicidal ideas. The patient is not nervous/anxious.   All other systems reviewed and are negative.   Blood pressure 111/66, pulse 70, temperature 98.2 F (36.8 C), resp. rate 18, height 5\' 9"  (1.753 m), weight 81.2 kg (179 lb), SpO2 98 %.Body mass index is 26.43 kg/m.  General Appearance: Casual  Eye Contact::  Fair  Speech:  Clear and Coherent409  Volume:  Normal  Mood:  Euthymic  Affect:  Appropriate  Thought Process:  Goal Directed and Descriptions of Associations: Intact  Orientation:  Full (Time, Place, and Person)  Thought Content:  Logical  Suicidal Thoughts:  No  Homicidal Thoughts:  No  Memory:   Immediate;   Fair Recent;   Fair Remote;   Fair  Judgement:  Fair  Insight:  Fair  Psychomotor Activity:  Normal  Concentration:  Fair  Recall:  F[employer]Fair  Fund of Knowledge:Fair  Language: Fair  Akathisia:  No  Handed:  Right  AIMS (if indicated):   0  Assets:  Communication Skills Desire for Improvement  Sleep:  Number of Hours: 5.75  Cognition: WNL  ADL's:  Intact   Mental Status Per Nursing Assessment::   On Admission:  NA  Demographic Factors:  Male  Loss Factors: NA  Historical Factors: Impulsivity  Risk Reduction Factors:   Positive social support and Positive therapeutic relationship  Continued Clinical Symptoms:  Previous Psychiatric Diagnoses and Treatments  Cognitive Features That Contribute To Risk:  None    Suicide Risk:  Minimal: No identifiable suicidal ideation.  Patients presenting with no risk factors but with morbid ruminations; may be classified as minimal risk based on the severity of the depressive symptoms  Follow-up Information    [facility]   Specialty:  [facility]Behavioral Health Contact information: [address] [place] [address] [phone]           Plan Of Care/Follow-up recommendations:  Activity:  no restrictions Diet:  regular Other:  none  Karina Nofsinger, MD 08/14/2016, 9:34 AM

## 2016-08-14 NOTE — Progress Notes (Signed)
Recreation Therapy Notes  Date: 08/14/16 Time: 1000 Location: 500 Hall Dayroom  Group Topic: Stress Management  Goal Area(s) Addresses:  Patient will verbalize importance of using healthy stress management.  Patient will identify positive emotions associated with healthy stress management.   Intervention: Stress Management  Activity :  Deep Breathing, Meditation.  LRT introduced the stress management techniques of deep breathing and meditation to patients.  LRT read a script to guide the patients through the deep breathing exercise and played a meditation that allowed patients to take inventory of the way they were feeling.  Patients were to follow along with the script and meditation to engage in the activity.  Education:  Stress Management, Discharge Planning.   Education Outcome: Acknowledges edcuation/In group clarification offered/Needs additional education  Clinical Observations/Feedback: Pt did not attend group.   Ruffin Lada, LRT/CTRS         Charissa Knowles A 08/14/2016 11:46 AM 

## 2016-08-14 NOTE — Tx Team (Signed)
Interdisciplinary Treatment and Diagnostic Plan Update  08/14/2016 Time of Session: 4:22 PM  Benjamin [name] MRN: [id number]  Principal Diagnosis: MDD (major depressive disorder), recurrent, with catatonic features ([place])  Secondary Diagnoses: Principal Problem:   MDD (major depressive disorder), recurrent, with catatonic features ([place])   Current Medications:  Current Facility-Administered Medications  Medication Dose Route Frequency Provider Last Rate Last Dose  . acetaminophen (TYLENOL) tablet 650 mg  650 mg Oral Q6H PRN Shuvon B Rankin, NP      . alum & mag hydroxide-simeth (MAALOX/MYLANTA) 200-200-20 MG/5ML suspension 30 mL  30 mL Oral Q4H PRN Shuvon B Rankin, NP      . ARIPiprazole (ABILIFY) tablet 10 mg  10 mg Oral Daily Ursula Alert, MD   10 mg at 08/14/16 0837  . fluvoxaMINE (LUVOX) tablet 150 mg  150 mg Oral QHS Ursula Alert, MD   150 mg at 08/13/16 2119  . hydrOXYzine (ATARAX/VISTARIL) tablet 25 mg  25 mg Oral Q6H PRN Ursula Alert, MD   25 mg at 08/12/16 2204  . LORazepam (ATIVAN) tablet 1 mg  1 mg Oral BID PRN Ursula Alert, MD       Or  . LORazepam (ATIVAN) injection 1 mg  1 mg Intramuscular BID PRN Ursula Alert, MD      . magnesium hydroxide (MILK OF MAGNESIA) suspension 30 mL  30 mL Oral Daily PRN Shuvon B Rankin, NP      . traZODone (DESYREL) tablet 100 mg  100 mg Oral QHS PRN Shuvon B Rankin, NP   100 mg at 08/12/16 2204   Current Outpatient Prescriptions  Medication Sig Dispense Refill  . [START ON 08/15/2016] ARIPiprazole (ABILIFY) 10 MG tablet Take 1 tablet (10 mg total) by mouth daily. 30 tablet 0  . fluvoxaMINE (LUVOX) 50 MG tablet Take 3 tablets (150 mg total) by mouth at bedtime. 90 tablet 0  . hydrOXYzine (ATARAX/VISTARIL) 25 MG tablet Take 1 tablet (25 mg total) by mouth every 6 (six) hours as needed for anxiety. 30 tablet 0  . traZODone (DESYREL) 100 MG tablet Take 1 tablet (100 mg total) by mouth at bedtime as needed for sleep. 30 tablet 0    PTA  Medications: No prescriptions prior to admission.    Treatment Modalities: Medication Management, Group therapy, Case management,  1 to 1 session with clinician, Psychoeducation, Recreational therapy.   Physician Treatment Plan for Primary Diagnosis: MDD (major depressive disorder), recurrent, with catatonic features ([place]) Long Term Goal(s): Improvement in symptoms so as ready for discharge  Short Term Goals: Ability to identify changes in lifestyle to reduce recurrence of condition will improve Ability to demonstrate self-control will improve Ability to maintain clinical measurements within normal limits will improve Ability to identify changes in lifestyle to reduce recurrence of condition will improve Ability to verbalize feelings will improve Ability to identify and develop effective coping behaviors will improve Compliance with prescribed medications will improve  Medication Management: Evaluate patient's response, side effects, and tolerance of medication regimen.  Therapeutic Interventions: 1 to 1 sessions, Unit Group sessions and Medication administration.  Evaluation of Outcomes: Adequate for Discharge  Physician Treatment Plan for Secondary Diagnosis: Principal Problem:   MDD (major depressive disorder), recurrent, with catatonic features ([place])   Long Term Goal(s): Improvement in symptoms so as ready for discharge  Short Term Goals: Ability to identify changes in lifestyle to reduce recurrence of condition will improve Ability to demonstrate self-control will improve Ability to maintain clinical measurements within normal limits will improve Ability to  identify changes in lifestyle to reduce recurrence of condition will improve Ability to verbalize feelings will improve Ability to identify and develop effective coping behaviors will improve Compliance with prescribed medications will improve  Medication Management: Evaluate patient's response, side effects, and  tolerance of medication regimen.  Therapeutic Interventions: 1 to 1 sessions, Unit Group sessions and Medication administration.  Evaluation of Outcomes: Adequate for Discharge   RN Treatment Plan for Primary Diagnosis: MDD (major depressive disorder), recurrent, with catatonic features ([place]) Long Term Goal(s): Knowledge of disease and therapeutic regimen to maintain health will improve  Short Term Goals: Ability to identify and develop effective coping behaviors will improve and Compliance with prescribed medications will improve  Medication Management: RN will administer medications as ordered by provider, will assess and evaluate patient's response and provide education to patient for prescribed medication. RN will report any adverse and/or side effects to prescribing provider.  Therapeutic Interventions: 1 on 1 counseling sessions, Psychoeducation, Medication administration, Evaluate responses to treatment, Monitor vital signs and CBGs as ordered, Perform/monitor CIWA, COWS, AIMS and Fall Risk screenings as ordered, Perform wound care treatments as ordered.  Evaluation of Outcomes: Adequate for Discharge   LCSW Treatment Plan for Primary Diagnosis: MDD (major depressive disorder), recurrent, with catatonic features ([place]) Long Term Goal(s): Safe transition to appropriate next level of care at discharge, Engage patient in therapeutic group addressing interpersonal concerns.  Short Term Goals: Engage patient in aftercare planning with referrals and resources  Therapeutic Interventions: Assess for all discharge needs, 1 to 1 time with Social worker, Explore available resources and support systems, Assess for adequacy in community support network, Educate family and significant other(s) on suicide prevention, Complete Psychosocial Assessment, Interpersonal group therapy.  Evaluation of Outcomes: Met  Pt will return home and follow up outpt.   Progress in Treatment: Attending groups:  Yes Participating in groups: Yes Taking medication as prescribed: Yes Toleration medication: Yes, no side effects reported at this time Family/Significant other contact made: No Patient understands diagnosis: Yes AEB asking for help with anxiety Discussing patient identified problems/goals with staff: Yes Medical problems stabilized or resolved: Yes Denies suicidal/homicidal ideation: Yes Issues/concerns per patient self-inventory: None Other: N/A  New problem(s) identified: None identified at this time.   New Short Term/Long Term Goal(s): None identified at this time.   Discharge Plan or Barriers:   Reason for Continuation of Hospitalization:   Estimated Length of Stay: D/C today  Attendees: Patient: 08/14/2016  4:22 PM  Physician: Ursula Alert, MD 08/14/2016  4:22 PM  Nursing: Hoy Register, RN 08/14/2016  4:22 PM  RN Care Manager: Lars Pinks, RN 08/14/2016  4:22 PM  Social Worker: Ripley Fraise 08/14/2016  4:22 PM  Recreational Therapist: Laretta Bolster  08/14/2016  4:22 PM  Other: Norberto Sorenson 08/14/2016  4:22 PM  Other:  08/14/2016  4:22 PM    Scribe for Treatment Team:  Roque Lias LCSW 08/14/2016 4:22 PM

## 2016-08-14 NOTE — Progress Notes (Signed)
Patient ID: Benjamin HensenJoshua Skeen, male   DOB: 27-Mar-1981, 36 y.o.   MRN: [id number] Patient discharged to home/self care.  Patient was eager to leave and acknowledged understanding of discharge instructions.  Patient acknowledged the receipt of all of his belongings upon discharge.

## 2016-08-14 NOTE — Plan of Care (Signed)
Problem: [facility] Participation in Recreation Therapeutic Interventions Goal: STG-Patient will identify at least five coping skills for ** STG: Coping Skills - Patient will be able to identify at least 5 coping skills for stress by conclusion of recreation therapy tx  Outcome: Completed/Met Date Met: 08/14/16 Pt was able to identify coping skills at the completion of coping skills and communication recreation therapy sessions.  Victorino Sparrow, LRT/CTRS

## 2017-01-01 ENCOUNTER — Emergency Department (HOSPITAL_COMMUNITY)
Admission: EM | Admit: 2017-01-01 | Discharge: 2017-01-02 | Disposition: A | Discharge status: Home / Self Care | Payer: Self-pay | Attending: Emergency Medicine | Admitting: Emergency Medicine

## 2017-01-01 ENCOUNTER — Encounter (HOSPITAL_COMMUNITY): Payer: Self-pay | Admitting: Family Medicine

## 2017-01-01 DIAGNOSIS — Z79899 Other long term (current) drug therapy: Secondary | ICD-10-CM | POA: Insufficient documentation

## 2017-01-01 DIAGNOSIS — F4325 Adjustment disorder with mixed disturbance of emotions and conduct: Secondary | ICD-10-CM | POA: Insufficient documentation

## 2017-01-01 DIAGNOSIS — I10 Essential (primary) hypertension: Secondary | ICD-10-CM | POA: Insufficient documentation

## 2017-01-01 DIAGNOSIS — F061 Catatonic disorder due to known physiological condition: Secondary | ICD-10-CM

## 2017-01-01 DIAGNOSIS — Z87891 Personal history of nicotine dependence: Secondary | ICD-10-CM | POA: Insufficient documentation

## 2017-01-01 DIAGNOSIS — F4329 Adjustment disorder with other symptoms: Secondary | ICD-10-CM | POA: Diagnosis present

## 2017-01-01 LAB — COMPREHENSIVE METABOLIC PANEL
ALK PHOS: 40 U/L (ref 38–126)
ALT: 23 U/L (ref 17–63)
AST: 22 U/L (ref 15–41)
Albumin: 4.8 g/dL (ref 3.5–5.0)
Anion gap: 16 — ABNORMAL HIGH (ref 5–15)
BILIRUBIN TOTAL: 1.4 mg/dL — AB (ref 0.3–1.2)
BUN: 12 mg/dL (ref 6–20)
CALCIUM: 10 mg/dL (ref 8.9–10.3)
CO2: 25 mmol/L (ref 22–32)
CREATININE: 1.06 mg/dL (ref 0.61–1.24)
Chloride: 97 mmol/L — ABNORMAL LOW (ref 101–111)
Glucose, Bld: 75 mg/dL (ref 65–99)
Potassium: 3.6 mmol/L (ref 3.5–5.1)
Sodium: 138 mmol/L (ref 135–145)
Total Protein: 8.1 g/dL (ref 6.5–8.1)

## 2017-01-01 LAB — CBC
HCT: 48.3 % (ref 39.0–52.0)
Hemoglobin: 18 g/dL — ABNORMAL HIGH (ref 13.0–17.0)
MCH: 31.2 pg (ref 26.0–34.0)
MCHC: 37.3 g/dL — AB (ref 30.0–36.0)
MCV: 83.7 fL (ref 78.0–100.0)
PLATELETS: 219 10*3/uL (ref 150–400)
RBC: 5.77 MIL/uL (ref 4.22–5.81)
RDW: 12 % (ref 11.5–15.5)
WBC: 6.6 10*3/uL (ref 4.0–10.5)

## 2017-01-01 LAB — ACETAMINOPHEN LEVEL: Acetaminophen (Tylenol), Serum: <10 ug/mL — ABNORMAL LOW (ref 10–30)

## 2017-01-01 LAB — SALICYLATE LEVEL

## 2017-01-01 LAB — ETHANOL

## 2017-01-01 MED ORDER — FLUVOXAMINE MALEATE 50 MG PO TABS
150.0000 mg | ORAL_TABLET | Freq: Every day | ORAL | Status: DC
  Filled 2017-01-01: qty 3

## 2017-01-01 MED ORDER — ZOLPIDEM TARTRATE 5 MG PO TABS: 5.0000 mg | ORAL_TABLET | Freq: Every evening | ORAL | Status: DC | PRN

## 2017-01-01 MED ORDER — ARIPIPRAZOLE 10 MG PO TABS
10.0000 mg | ORAL_TABLET | Freq: Every day | ORAL | Status: DC
  Filled 2017-01-01: qty 1

## 2017-01-01 MED ORDER — IBUPROFEN 200 MG PO TABS: 600.0000 mg | ORAL_TABLET | Freq: Three times a day (TID) | ORAL | Status: DC | PRN

## 2017-01-01 MED ORDER — TRAZODONE HCL 100 MG PO TABS: 100.0000 mg | ORAL_TABLET | Freq: Every evening | ORAL | Status: DC | PRN

## 2017-01-01 MED ORDER — HYDROXYZINE HCL 25 MG PO TABS: 25.0000 mg | ORAL_TABLET | Freq: Four times a day (QID) | ORAL | Status: DC | PRN

## 2017-01-01 MED ORDER — ONDANSETRON HCL 4 MG PO TABS: 4.0000 mg | ORAL_TABLET | Freq: Three times a day (TID) | ORAL | Status: DC | PRN

## 2017-01-01 NOTE — ED Notes (Signed)
Patient was provided a urinal to obtain urine sample. Pt was provided paper scrubs and socks but refuses to change clothes.

## 2017-01-01 NOTE — ED Notes (Signed)
Bed: [id number] Expected date:  Expected time:  Means of arrival:  Comments: EMS 36 yo male-hx depression/non-verbal

## 2017-01-01 NOTE — ED Triage Notes (Signed)
[facility] [place] [facility]. Pt's brother asked for a wellness check up concerning patients well being due to him not eating and experiencing a custody battle concerning his children. Pt is alert but non verbal. He will not say a word. He stared off in space and does not make eye contact when attempting to have a conversation.

## 2017-01-02 DIAGNOSIS — F4329 Adjustment disorder with other symptoms: Secondary | ICD-10-CM | POA: Diagnosis present

## 2017-01-02 NOTE — Discharge Instructions (Signed)
For your ongoing mental health needs, you are advised to follow up with [facility].  New and returning patients are seen at their walk-in clinic.  Walk-in hours are Monday - Friday from 8:00 am - 3:00 pm.  Walk-in patients are seen on a first come, first served basis.  Try to arrive as early as possible for he best chance of being seen the same day: ° °     [facility] °     [address] °     [place], [place] [address] °     [phone] °

## 2017-01-02 NOTE — BHH Counselor (Addendum)
Clinician attempted to engage the pt to complete, his TTS consult, to no avail. Clinician noted from NT, before TTS consult was attempted the pt went into another room and took a blanket.    Clinician spoke to Nira ConnJason Berry, NP, the pt would not engage in TTS consult, after numerous prompts. Per Nira ConnJason Berry, NP complete TTS consult during day shift. Dr. Preston FleetingGlick is in agreement.    Gwinda Passereylese D Bennett, MS, [facility], [facility] Triage Specialist [phone]

## 2017-01-02 NOTE — BHH Suicide Risk Assessment (Signed)
Suicide Risk Assessment  Discharge Assessment   [facility] Discharge Suicide Risk Assessment   Principal Problem: Adjustment disorder with disturbance of emotion Discharge Diagnoses:  Patient Active Problem List   Diagnosis Date Noted  . Adjustment disorder with disturbance of emotion [F43.29] 01/02/2017    Priority: High  . MDD (major depressive disorder), recurrent, with catatonic features (HCC) [F33.9, F06.1] 08/09/2016  . Severe recurrent major depression with psychotic features (HCC) [F33.3] 07/21/2016  . MDD (major depressive disorder) [F32.9] 07/21/2016  . PTSD (post-traumatic stress disorder) [F43.10] 07/15/2016  . OCD (obsessive compulsive disorder) [F42.9] 07/15/2016  . 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 Bradycardia [R00.1] 09/18/2012  . Decreased oral intake [R63.8] 09/17/2012  . Psychosis [F29] 09/09/2012  . Severe episode of recurrent major depressive disorder, with psychotic features (HCC) [F33.3] 09/08/2012  . History of substance abuse [Z87.898] 09/08/2012    Total Time spent with patient: 45 minutes  Musculoskeletal: Strength & Muscle Tone: within normal limits Gait & Station: normal Patient leans: N/A  Psychiatric Specialty Exam:   Blood pressure (!) 136/91, pulse 96, temperature 98.4 F (36.9 C), temperature source Oral, resp. rate 16, SpO2 96 %.There is no height or weight on file to calculate BMI.  General Appearance: Casual  Eye Contact::  Good  Speech:  patient refusing to talk  Volume:  patient refusing to talk  Mood:  patient refusing to talk  Affect:  Blunt  Thought Process:  patient refusing to talk  Orientation:  patient refusing to talk  Thought Content:  patient refusing to talk  Suicidal Thoughts:  patient refusing to talk  Homicidal Thoughts:  patient refusing to talk  Memory:  patient refusing to talk  Judgement:  patient refusing to talk  Insight:  patient refusing to talk  Psychomotor  Activity:  Decreased  Concentration:  patient refusing to talk  Recall:  patient refusing to talk  Fund of Knowledge:Fair based on prior assessments  Language: patient refusing to talk  Akathisia:  patient refusing to talk  Handed:  Right  AIMS (if indicated):     Assets:  Housing Leisure Time Physical Health Resilience Social Support  Sleep:     Cognition: patient refusing to talk  ADL's:  Intact   Mental Status Per Nursing Assessment::   On Admission:   Patient was brought to the ED by his brother who felt he was not eating and going through a custody battle.  This patient presented here three times with similar presentations and care, admitted each time.  He discharges the first time and threw away his prescriptions and stated he would not take them.  The next time he was admitted and received ECT along with the second time. However, Chett does not follow up after admission or take medications, no vestment in his care.  Today, he refuses medications and already tried to leave. Stable for discharge as he is not a danger to self or others, no substance abuse or psychosis.  Encouraged to go to his outpatient providers/resources.  Demographic Factors:  Male, Caucasian and Living alone  Loss Factors: NA  Historical Factors: NA  Risk Reduction Factors:   Responsible for children under 28 years of age, Sense of responsibility to family and Positive social support  Continued Clinical Symptoms:  Refuses to talk   Cognitive Features That Contribute To Risk:  None    Suicide Risk:  Minimal: No identifiable suicidal ideation.  Patients presenting with no risk factors but with morbid  ruminations; may be classified as minimal risk based on the severity of the depressive symptoms    Plan Of Care/Follow-up recommendations:  Activity:  as tolerated Diet:  heart healthy diet  Kasyn Stouffer, NP 01/02/2017, 11:01 AM

## 2017-01-02 NOTE — BH Assessment (Signed)
BHH Assessment Progress Note  Per Mojeed Akintayo, MD, this pt does not require psychiatric hospitalization at this time.  [facility]  This has been included in pt's discharge instructions.  Pt's nurse, Aram BeechamCynthia, has been notified.  Doylene Canninghomas Tricha Ruggirello, MA Triage Specialist [phone]

## 2017-01-02 NOTE — ED Notes (Addendum)
Pt sitting up at bedside making no eye contact when this RN asks simple opened direct questions. Pt has put on shoes and folded blankets. Preston FleetingGlick, MD notified of this. MD states if pt attempts to leave allow him to do so, MD has no reasonable cause to IVC pt.

## 2017-01-02 NOTE — ED Notes (Signed)
Patient refused vital signs and refused to sign out.  Patient left unit and would not respond when his name was called.

## 2017-01-02 NOTE — BHH Counselor (Signed)
Clinician  Contacted NT and discussed the TTS consult will be completed during day shift due to the pt not engaging. NT reported, she would update RN.   Gwinda Passereylese D Bennett, MS, [facility], [facility] Triage Specialist [phone]

## 2017-01-02 NOTE — BH Assessment (Signed)
Assessment Note  Benjamin [name] is an 36 y.o. male. Patient with history of depression and mental disorder. Writer attempted to assess this patient. Patient would not engage in a conversation with this Clinical research associate. Patient with history of catatonic behavior presents to [facility] via EMS. Information given is limited as patient appears to be in a catatonic state. Writer attempted to assess patient, however; their was no eye contact or body movement.  Writer unable to confirm or deny suicidal ideations, homicidal ideations, and/or AVH's. Per ED notes, "Patients is from home and transported via [facility] and [facility]. Pt's brother asked for a wellness check up concerning patients well being due to him not eating and experiencing a custody battle concerning his children. Pt is alert but non verbal. He will not say a word. He stared off in space and does not make eye contact when attempting to have a conversation." Patient has been hospitalized for similar symptoms.     Diagnosis: Major Depressive Disorder, Recurrent, Severe, without psychotic features  Past Medical History:  Past Medical History:  Diagnosis Date  . Depression   . Herniated disc    x3  . Hypertension   . Mental disorder     Past Surgical History:  Procedure Laterality Date  . SHOULDER SURGERY      Family History: History reviewed. No pertinent family history.  Social History:  reports that he quit smoking about 4 years ago. His smoking use included Cigarettes. He smoked 1.00 pack per day. He has never used smokeless tobacco. He reports that he does not drink alcohol or use drugs.  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  General Assessment Data [facility] of Assessment: [facility] TTS Assessment: In system Is this a Tele or Face-to-Face Assessment?: Face-to-Face Is this an Initial Assessment or a Re-assessment for this encounter?: Initial Assessment Marital status: Single Maiden name:  (n/a) Is patient pregnant?: No Pregnancy Status: No Living Arrangements: Alone Can pt return to current living arrangement?: Yes Admission Status: Voluntary Is patient capable of signing voluntary admission?: Yes Referral Source: Self/Family/Friend Insurance type:  (Self Pay )     Crisis Care Plan Living Arrangements: Alone Legal Guardian: Other: (no legal guardian ) Name of Psychiatrist:  (unk ) Name of Therapist:  (unk)  Education Status Is patient currently in school?:  (unk) Current Grade:  (unk) Highest grade of school patient has completed:  (unk) Name of school:  (unk) Contact person:  (unk )  Risk to self with the past 6 months Suicidal Ideation:  (unk ) Has patient been a risk to self within the past 6 months prior to admission? :  (unk) Suicidal Intent:  (unk) Has patient had any suicidal intent within the past 6 months prior to admission? :  (unk) Is patient at risk for suicide?:  (unk) Suicidal Plan?:  (unk) Has patient had any suicidal plan within the past 6 months prior to admission? :  (unk) Access to Means:  (unk) What  has been your use of drugs/alcohol within the last 12 months?:  (unk) Previous Attempts/Gestures:  (unk) How many times?:  (unk) Other Self Harm Risks:  (unk) Triggers for Past Attempts: Unknown Intentional Self Injurious Behavior:  (unk) Family Suicide History: Unknown Recent stressful life event(s):  (unk) Persecutory voices/beliefs?:  (unk) Depression:   (unk) Depression Symptoms:  (unk) Substance abuse history and/or treatment for substance abuse?:  (unk) Suicide prevention information given to non-admitted patients:  (unk)  Risk to Others within the past 6 months Homicidal Ideation: No Does patient have any lifetime risk of violence toward others beyond the six months prior to admission? : No Thoughts of Harm to Others: No Current Homicidal Intent: No Current Homicidal Plan: No Access to Homicidal Means: No Identified Victim:  (n/a) History of harm to others?: No Assessment of Violence: None Noted Violent Behavior Description:  (patient is calm and cooperative ) Does patient have access to weapons?: No Criminal Charges Pending?: No Does patient have a court date: No Is patient on probation?: No  Psychosis Hallucinations:  (unk) Delusions:  (unk)  Mental Status Report Appearance/Hygiene: Unable to Assess (unk) Eye Contact: Unable to Assess Motor Activity: Unable to assess Speech: Unable to assess Level of Consciousness: Unable to assess Mood: Other (Comment) (UTA) Affect: Flat Anxiety Level: None (UTS) Thought Processes: Unable to Assess Judgement: Unable to Assess Orientation: Unable to assess Obsessive Compulsive Thoughts/Behaviors: Unable to Assess  Cognitive Functioning Concentration: Unable to Assess Memory: Unable to Assess IQ: Average Insight: Unable to Assess Impulse Control: Unable to Assess Appetite:  (UTA) Weight Loss:  (unk) Weight Gain:  (unk) Sleep: Unable to Assess Total Hours of Sleep:  (unk) Vegetative Symptoms: Unable to Assess  ADLScreening [facility] Assessment Services) Patient's cognitive ability adequate to safely complete daily activities?: Yes Patient able to express need for assistance with ADLs?: Yes Independently performs ADLs?: Yes (appropriate for developmental age)  Prior Inpatient Therapy Prior Inpatient Therapy: Yes Prior Therapy Dates:  (admissions to [facility] and [facility].Marland Kitchenlast admit to  [facility] 08/19/16) Prior Therapy Facilty/Provider(s):  [facility] and [facility]) Reason for Treatment:  (catatonia and mustism)     ADL Screening (condition at time of admission) Patient's cognitive ability adequate to safely complete daily activities?: Yes Is the patient deaf or have difficulty hearing?: No Does the patient have difficulty seeing, even when wearing glasses/contacts?: No Does the patient have difficulty concentrating, remembering, or making decisions?: No Patient able to express need for assistance with ADLs?: Yes Does the patient have difficulty dressing or bathing?: No Independently performs ADLs?: Yes (appropriate for developmental age) Does the patient have difficulty walking or climbing stairs?: No Weakness of Legs: None Weakness of Arms/Hands: None  Home Assistive Devices/Equipment Home Assistive Devices/Equipment: None    Abuse/Neglect Assessment (Assessment to be complete while patient is alone) Physical Abuse: Denies Verbal Abuse: Denies Sexual Abuse: Denies Exploitation of patient/patient's resources: Denies Self-Neglect: Denies Values / Beliefs Cultural Requests During Hospitalization: None Spiritual Requests During Hospitalization: None   Advance Directives (For Healthcare) Does Patient Have a Medical Advance Directive?: No Would patient like information on creating a medical advance directive?: No - Patient declined Nutrition Screen- [facility] Adult/[facility] Patient's home diet: Regular  Additional Information 1:1 In Past 12 Months?: No CIRT Risk: No Elopement Risk: No Does patient have medical clearance?: Yes     Disposition:  Disposition Initial Assessment Completed for this Encounter: Yes Disposition of Patient: Other dispositions (Per Dr. Jannifer Franklin and Nanine Means, DNP, patient is psych cl) Other disposition(s): Other (Comment) (  Dr. Jannifer FranklinAkintayo and Nanine MeansJamison Lord, DNP, patient is psych cleared)  On Site Evaluation by:   Reviewed with Physician:    Melynda Rippleoyka  Tahliyah Anagnos 01/02/2017 11:19 AM

## 2017-01-02 NOTE — ED Notes (Signed)
Pt allows this RN to physically obtain vital signs, but does not verbally respond to questions.

## 2017-01-02 NOTE — ED Notes (Addendum)
Offered patient breakfast, and his medication.  Patient does not have a reaction, is not verbal, and does not have facial expressions.  Also, would not get dressed into paper scrubs.  Dr. Mervyn SkeetersA notified.

## 2017-01-02 NOTE — ED Provider Notes (Signed)
[facility] DEPT Provider Note   CSN: [id number] Arrival date & time: 01/01/17  2027     History   Chief Complaint Chief Complaint  Patient presents with  . Psychiatric Evaluation    HPI Benjamin [name] is a 36 y.o. male.  HPI Level 5 caveat for catatonia / psychiatric instability.  Pt is providng no history at all.  Per nursing report, patients is from home and [facility] [place] [employer]. Pt's brother asked for a wellness check up concerning patients well being due to him not eating and experiencing a custody battle concerning his children.  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  Past Medical History:  Diagnosis Date  . Depression   . Herniated disc    x3  . Hypertension   . Mental disorder     Patient Active Problem List   Diagnosis Date Noted  . MDD (major depressive disorder), recurrent, with catatonic features (HCC) 08/09/2016  . Severe recurrent major depression with psychotic features (HCC) 07/21/2016  . MDD (major depressive disorder) 07/21/2016  . PTSD (post-traumatic stress disorder) 07/15/2016  . OCD (obsessive compulsive disorder) 07/15/2016  . 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 Major depressive disorder, recurrent episode, severe with catatonia (HCC) 07/09/2016  . 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 Bradycardia 09/18/2012  . Decreased oral intake 09/17/2012  . Psychosis 09/09/2012  . Severe episode of recurrent major depressive disorder, with psychotic features (HCC) 09/08/2012  . History of substance abuse 09/08/2012    Past Surgical History:  Procedure Laterality Date  . SHOULDER SURGERY         Home Medications    Prior to Admission medications   Medication Sig Start Date End Date Taking? Authorizing Provider  ARIPiprazole (ABILIFY) 10 MG tablet Take 1 tablet (10 mg total) by mouth daily. 08/15/16   Adonis BrookAgustin, Sheila, NP  fluvoxaMINE (LUVOX) 50 MG tablet Take 3  tablets (150 mg total) by mouth at bedtime. 08/14/16   Adonis BrookAgustin, Sheila, NP  hydrOXYzine (ATARAX/VISTARIL) 25 MG tablet Take 1 tablet (25 mg total) by mouth every 6 (six) hours as needed for anxiety. 08/14/16   Adonis BrookAgustin, Sheila, NP  traZODone (DESYREL) 100 MG tablet Take 1 tablet (100 mg total) by mouth at bedtime as needed for sleep. 08/14/16   Adonis BrookAgustin, Sheila, NP    Family History History reviewed. No pertinent family history.  Social History Social History  Substance Use Topics  . Smoking status: Former Smoker    Packs/day: 1.00    Types: Cigarettes    Quit date: 04/04/2012  . Smokeless tobacco: Never Used     Comment: Pt is nonverbal. Unknown.   . Alcohol use No     Comment: Pt is nonverbal. Unknown.      Allergies   Patient has no known allergies.   Review of Systems Review of Systems  Unable to perform ROS: Psychiatric disorder     Physical Exam Updated Vital Signs BP (!) 142/102 (BP Location: Right Arm)   Pulse 79   Temp 98.3 F (36.8 C) (Oral)   Resp 20   SpO2 98%   Physical Exam  Constitutional: He appears well-developed.  HENT:  Head: Atraumatic.  Neck: Neck supple.  Cardiovascular: Normal rate.   Pulmonary/Chest: Effort normal.  Neurological:  mute  Skin: Skin is warm.  Psychiatric:  Mute, flat affect  Nursing note and vitals reviewed.    ED Treatments / Results  Labs (all labs  ordered are listed, but only abnormal results are displayed) Labs Reviewed  COMPREHENSIVE METABOLIC PANEL - Abnormal; Notable for the following:       Result Value   Chloride 97 (*)    Total Bilirubin 1.4 (*)    Anion gap 16 (*)    All other components within normal limits  ACETAMINOPHEN LEVEL - Abnormal; Notable for the following:    Acetaminophen (Tylenol), Serum <10 (*)    All other components within normal limits  CBC - Abnormal; Notable for the following:    Hemoglobin 18.0 (*)    MCHC 37.3 (*)    All other components within normal limits  ETHANOL  SALICYLATE  LEVEL  RAPID URINE DRUG SCREEN, HOSP PERFORMED    EKG  EKG Interpretation None       Radiology No results found.  Procedures Procedures (including critical care time)  Medications Ordered in ED Medications  ondansetron (ZOFRAN) tablet 4 mg (not administered)  zolpidem (AMBIEN) tablet 5 mg (not administered)  ibuprofen (ADVIL,MOTRIN) tablet 600 mg (not administered)  ARIPiprazole (ABILIFY) tablet 10 mg (not administered)  hydrOXYzine (ATARAX/VISTARIL) tablet 25 mg (not administered)  traZODone (DESYREL) tablet 100 mg (not administered)  fluvoxaMINE (LUVOX) tablet 150 mg (150 mg Oral Refused 01/02/17 0048)     Initial Impression / Assessment and Plan / ED Course  I have reviewed the triage vital signs and the nursing notes.  Pertinent labs & imaging results that were available during my care of the patient were reviewed by me and considered in my medical decision making (see chart for details).     Pt comes in catatonic. He has MDD and catatonia elective mutism per records. Seems like there is increased stress in his life. We will call TTS. PT is medically cleared.   Final Clinical Impressions(s) / ED Diagnoses   Final diagnoses:  Catatonia    New Prescriptions New Prescriptions   No medications on file     Derwood Kaplan, MD 01/02/17 [phone]

## 2017-01-03 ENCOUNTER — Emergency Department (HOSPITAL_COMMUNITY)
Admission: EM | Admit: 2017-01-03 | Discharge: 2017-01-05 | Disposition: A | Discharge status: Other Acute Inpatient Hospital | Payer: No Typology Code available for payment source | Attending: Emergency Medicine | Admitting: Emergency Medicine

## 2017-01-03 ENCOUNTER — Encounter (HOSPITAL_COMMUNITY): Payer: Self-pay | Admitting: *Deleted

## 2017-01-03 DIAGNOSIS — F339 Major depressive disorder, recurrent, unspecified: Secondary | ICD-10-CM

## 2017-01-03 DIAGNOSIS — Z9119 Patient's noncompliance with other medical treatment and regimen: Secondary | ICD-10-CM | POA: Insufficient documentation

## 2017-01-03 DIAGNOSIS — Z79899 Other long term (current) drug therapy: Secondary | ICD-10-CM | POA: Insufficient documentation

## 2017-01-03 DIAGNOSIS — Z87891 Personal history of nicotine dependence: Secondary | ICD-10-CM | POA: Insufficient documentation

## 2017-01-03 DIAGNOSIS — Z008 Encounter for other general examination: Secondary | ICD-10-CM

## 2017-01-03 DIAGNOSIS — F333 Major depressive disorder, recurrent, severe with psychotic symptoms: Secondary | ICD-10-CM | POA: Insufficient documentation

## 2017-01-03 DIAGNOSIS — I10 Essential (primary) hypertension: Secondary | ICD-10-CM | POA: Insufficient documentation

## 2017-01-03 DIAGNOSIS — F061 Catatonic disorder due to known physiological condition: Secondary | ICD-10-CM | POA: Diagnosis present

## 2017-01-03 LAB — CBC WITH DIFFERENTIAL/PLATELET
Basophils Absolute: 0 10*3/uL (ref 0.0–0.1)
Basophils Relative: 0 %
Eosinophils Absolute: 0.2 10*3/uL (ref 0.0–0.7)
Eosinophils Relative: 2 %
HEMATOCRIT: 50.6 % (ref 39.0–52.0)
Hemoglobin: 18.9 g/dL — ABNORMAL HIGH (ref 13.0–17.0)
LYMPHS ABS: 2.5 10*3/uL (ref 0.7–4.0)
LYMPHS PCT: 27 %
MCH: 31.5 pg (ref 26.0–34.0)
MCHC: 37.4 g/dL — AB (ref 30.0–36.0)
MCV: 84.3 fL (ref 78.0–100.0)
MONO ABS: 0.6 10*3/uL (ref 0.1–1.0)
MONOS PCT: 7 %
NEUTROS ABS: 5.9 10*3/uL (ref 1.7–7.7)
Neutrophils Relative %: 64 %
Platelets: 226 10*3/uL (ref 150–400)
RBC: 6 MIL/uL — ABNORMAL HIGH (ref 4.22–5.81)
RDW: 12.5 % (ref 11.5–15.5)
WBC: 9.2 10*3/uL (ref 4.0–10.5)

## 2017-01-03 LAB — COMPREHENSIVE METABOLIC PANEL
ALBUMIN: 5.5 g/dL — AB (ref 3.5–5.0)
ALT: 28 U/L (ref 17–63)
AST: 54 U/L — AB (ref 15–41)
Alkaline Phosphatase: 42 U/L (ref 38–126)
Anion gap: 18 — ABNORMAL HIGH (ref 5–15)
BILIRUBIN TOTAL: 1.6 mg/dL — AB (ref 0.3–1.2)
BUN: 34 mg/dL — AB (ref 6–20)
CHLORIDE: 99 mmol/L — AB (ref 101–111)
CO2: 24 mmol/L (ref 22–32)
Calcium: 10.2 mg/dL (ref 8.9–10.3)
Creatinine, Ser: 1.18 mg/dL (ref 0.61–1.24)
GFR calc Af Amer: >60 mL/min (ref >60)
GFR calc non Af Amer: >60 mL/min (ref >60)
GLUCOSE: 94 mg/dL (ref 65–99)
POTASSIUM: 3.7 mmol/L (ref 3.5–5.1)
Sodium: 141 mmol/L (ref 135–145)
Total Protein: 9.1 g/dL — ABNORMAL HIGH (ref 6.5–8.1)

## 2017-01-03 LAB — SALICYLATE LEVEL

## 2017-01-03 LAB — ACETAMINOPHEN LEVEL: Acetaminophen (Tylenol), Serum: <10 ug/mL — ABNORMAL LOW (ref 10–30)

## 2017-01-03 LAB — ETHANOL: Alcohol, Ethyl (B): <5 mg/dL (ref <5)

## 2017-01-03 MED ORDER — NICOTINE 21 MG/24HR TD PT24: 21.0000 mg | MEDICATED_PATCH | Freq: Every day | TRANSDERMAL | Status: DC

## 2017-01-03 MED ORDER — HYDROXYZINE HCL 25 MG PO TABS
25.0000 mg | ORAL_TABLET | Freq: Four times a day (QID) | ORAL | Status: DC | PRN
Start: 2017-01-03 — End: 2017-01-05

## 2017-01-03 MED ORDER — IBUPROFEN 200 MG PO TABS: 600.0000 mg | ORAL_TABLET | Freq: Three times a day (TID) | ORAL | Status: DC | PRN

## 2017-01-03 MED ORDER — TRAZODONE HCL 100 MG PO TABS: 100.0000 mg | ORAL_TABLET | Freq: Every evening | ORAL | Status: DC | PRN

## 2017-01-03 MED ORDER — ALUM & MAG HYDROXIDE-SIMETH 200-200-20 MG/5ML PO SUSP: 30.0000 mL | Freq: Four times a day (QID) | ORAL | Status: DC | PRN

## 2017-01-03 MED ORDER — ONDANSETRON HCL 4 MG PO TABS: 4.0000 mg | ORAL_TABLET | Freq: Three times a day (TID) | ORAL | Status: DC | PRN

## 2017-01-03 MED ORDER — ZOLPIDEM TARTRATE 5 MG PO TABS: 5.0000 mg | ORAL_TABLET | Freq: Every evening | ORAL | Status: DC | PRN

## 2017-01-03 MED ORDER — ARIPIPRAZOLE 10 MG PO TABS
10.0000 mg | ORAL_TABLET | Freq: Every day | ORAL | Status: DC
  Filled 2017-01-03: qty 1

## 2017-01-03 MED ORDER — FLUVOXAMINE MALEATE 50 MG PO TABS
150.0000 mg | ORAL_TABLET | Freq: Every day | ORAL | Status: DC
  Filled 2017-01-03 (×2): qty 3

## 2017-01-03 MED ORDER — ACETAMINOPHEN 325 MG PO TABS: 650.0000 mg | ORAL_TABLET | ORAL | Status: DC | PRN

## 2017-01-03 NOTE — Progress Notes (Signed)
Brown pair of shoes  Red shirt  Blue jeans  Pair of white socks  Rosary cross  blue and white beads

## 2017-01-03 NOTE — ED Triage Notes (Signed)
Pt brought in by [employer].  Pt follows verbal commands but does not respond verbally.  PT is IVC.

## 2017-01-03 NOTE — ED Provider Notes (Signed)
[facility] DE[facility] Provider Note   CSN: [id number] Arrival date & time: 01/03/17  1938     History   Chief Complaint Chief Complaint  Patient presents with  . IVC    HPI Benjamin [name] is a 36 y.o. male with a PMHx of depression, HTN, and other psychiatric conditions listed below, who presents to the ED via [employer] who bring the pt in after he was IVC'd. Level 5 caveat due to psychiatric condition, pt catatonic and refusing to speak or answer questions. Per IVC paperwork "a danger to self, to wit: Petitioner reports previous hospitalizations for mental problems in 2014, 2017, and 2018, has completely stopped talking, sleeping, eating, and drinking (4 week), petitioner describes respondents condition as 'catatonic'.". Per chart review, pt was seen yesterday for similar issues, and was cleared by psych and discharged home with instructions to f/up with [facility]. Patient does not answer any questions or assist with evaluation, does not report anything. Doesn't even shake head yes/no to questions, therefore remainder of history/ROS are limited due to psychiatric condition.    The history is provided by the patient, medical records and the police. No language interpreter was used.  Mental Health Problem  Presenting symptoms: bizarre behavior   Patient accompanied by:  Law enforcement Degree of incapacity (severity):  Severe Onset quality:  Unable to specify Timing:  Unable to specify Progression:  Unable to specify Chronicity:  Recurrent Treatment compliance:  Unable to specify Risk factors: hx of mental illness and recent psychiatric admission     Past Medical History:  Diagnosis Date  . Depression   . Herniated disc    x3  . Hypertension   . Mental disorder     Patient Active Problem List   Diagnosis Date Noted  . Adjustment disorder with disturbance of emotion 01/02/2017  . MDD (major depressive disorder), recurrent, with catatonic features (HCC) 08/09/2016  . Severe recurrent  major depression with psychotic features (HCC) 07/21/2016  . MDD (major depressive disorder) 07/21/2016  . PTSD (post-traumatic stress disorder) 07/15/2016  . OCD (obsessive compulsive disorder) 07/15/2016  . 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 Bradycardia 09/18/2012  . Decreased oral intake 09/17/2012  . Psychosis 09/09/2012  . Severe episode of recurrent major depressive disorder, with psychotic features (HCC) 09/08/2012  . History of substance abuse 09/08/2012    Past Surgical History:  Procedure Laterality Date  . SHOULDER SURGERY         Home Medications    Prior to Admission medications   Medication Sig Start Date End Date Taking? Authorizing Provider  ARIPiprazole (ABILIFY) 10 MG tablet Take 1 tablet (10 mg total) by mouth daily. 08/15/16   Adonis Brook, NP  fluvoxaMINE (LUVOX) 50 MG tablet Take 3 tablets (150 mg total) by mouth at bedtime. 08/14/16   Adonis Brook, NP  hydrOXYzine (ATARAX/VISTARIL) 25 MG tablet Take 1 tablet (25 mg total) by mouth every 6 (six) hours as needed for anxiety. 08/14/16   Adonis Brook, NP  traZODone (DESYREL) 100 MG tablet Take 1 tablet (100 mg total) by mouth at bedtime as needed for sleep. 08/14/16   Adonis Brook, NP    Family History No family history on file.  Social History Social History  Substance Use Topics  . Smoking status: Former Smoker    Packs/day: 1.00    Types: Cigarettes    Quit date: 04/04/2012  . Smokeless tobacco: Never Used     Comment: Pt is nonverbal. Unknown.   . Alcohol  use No     Comment: Pt is nonverbal. Unknown.      Allergies   Patient has no known allergies.   Review of Systems Review of Systems  Unable to perform ROS: Psychiatric disorder   Level 5 caveat due to psychiatric condition  Physical Exam Updated Vital Signs BP (!) 143/97 (BP Location: Left Arm)   Pulse 71   Temp 97.8 F (36.6 C) (Oral)   Resp 17   SpO2 100%   Physical Exam    Constitutional: Vital signs are normal. He appears well-developed and well-nourished.  Non-toxic appearance. No distress.  Afebrile, nontoxic, NAD, makes eye contact only once during entire exam, follows commands but doesn't speak or answer questions  HENT:  Head: Normocephalic and atraumatic.  Mouth/Throat: Oropharynx is clear and moist and mucous membranes are normal.  Eyes: Conjunctivae and EOM are normal. Right eye exhibits no discharge. Left eye exhibits no discharge.  Neck: Normal range of motion. Neck supple.  Cardiovascular: Normal rate, regular rhythm, normal heart sounds and intact distal pulses.  Exam reveals no gallop and no friction rub.   No murmur heard. Pulmonary/Chest: Effort normal and breath sounds normal. No respiratory distress. He has no decreased breath sounds. He has no wheezes. He has no rhonchi. He has no rales.  Abdominal: Soft. Normal appearance and bowel sounds are normal. He exhibits no distension. There is no tenderness. There is no rigidity, no rebound, no guarding, no CVA tenderness, no tenderness at McBurney's point and negative Murphy's sign.  Musculoskeletal: Normal range of motion.  Neurological: He is alert. He has normal strength. No sensory deficit.  Unable to assess orientation due to pt not answering questions or speaking  Skin: Skin is warm, dry and intact. No rash noted.  Psychiatric: He exhibits a depressed mood. He is noncommunicative.  Depressed affect, catatonic, although very calm. Pt not answering questions but will follow commands. Doesn't seem to be responding to internal stimuli, but it's hard to tell due to catatonic state.  Nursing note and vitals reviewed.    ED Treatments / Results  Labs (all labs ordered are listed, but only abnormal results are displayed) Labs Reviewed  COMPREHENSIVE METABOLIC PANEL  ETHANOL  RAPID URINE DRUG SCREEN, HOSP PERFORMED  CBC WITH DIFFERENTIAL/PLATELET  ACETAMINOPHEN LEVEL  SALICYLATE LEVEL     EKG  EKG Interpretation None       Radiology No results found.  Procedures Procedures (including critical care time)  Medications Ordered in ED Medications - No data to display   Initial Impression / Assessment and Plan / ED Course  I have reviewed the triage vital signs and the nursing notes.  Pertinent labs & imaging results that were available during my care of the patient were reviewed by me and considered in my medical decision making (see chart for details).     36 y.o. male here under IVC, pt catatonic and does not answer any questions. Exam otherwise benign, clear lungs, no abdominal tenderness. Pt doesn't talk, but cooperates with exam and follows commands to sit/inspire/etc. Just seen and discharged last night by psych for similar issue; labs then were hemoconcentrated with slight anion gap likely from starvation/dehydration, but overall unremarkable otherwise. Will get repeat labs today to ensure no change, then turn it over to psych again. Will reassess shortly.   10:59 PM Labs pending. Patient care to be resumed by Sharilyn Sites, PA-C at shift change sign-out. Patient history has been discussed with midlevel resuming care. Please see their notes  for further documentation of pending results and dispo/care. Pt stable at sign-out and updated on transfer of care.   Final Clinical Impressions(s) / ED Diagnoses   Final diagnoses:  Catatonia  Medical clearance for psychiatric admission    New Prescriptions New Prescriptions   No medications [address]     Calise Dunckel, [place], [place]PA-C 01/03/17 2301    Gerhard MunchLockwood, Robert, MD 01/04/17 Jacinta [id number]

## 2017-01-03 NOTE — ED Triage Notes (Signed)
Pt bib [employer] and under IVC.  According to IVC paperwork, pt "has completely stopped talking, sleeping, eating and drinking (for a week), petitioner describes respondent's condition as 'catatonic.'"  [employer] reports that [employer] is nonverbal but has been cooperative and calm.

## 2017-01-04 LAB — RAPID URINE DRUG SCREEN, HOSP PERFORMED
AMPHETAMINES: NOT DETECTED
Barbiturates: NOT DETECTED
Benzodiazepines: NOT DETECTED
Cocaine: NOT DETECTED
OPIATES: NOT DETECTED
TETRAHYDROCANNABINOL: POSITIVE — AB

## 2017-01-04 LAB — CBG MONITORING, ED
GLUCOSE-CAPILLARY: 75 mg/dL (ref 65–99)
Glucose-Capillary: 91 mg/dL (ref 65–99)
Glucose-Capillary: 87 mg/dL (ref 65–99)

## 2017-01-04 MED ORDER — SODIUM CHLORIDE 0.9 % IV BOLUS (SEPSIS)
1000.0000 mL | Freq: Once | INTRAVENOUS | Status: AC
  Administered 2017-01-04: 1000 mL via INTRAVENOUS

## 2017-01-04 NOTE — ED Notes (Signed)
TTS attempted assessment.  Pt would not respond verbally.

## 2017-01-04 NOTE — ED Notes (Signed)
Pt at sink washing hands, then drying hands on scrub pants.  Pt offered paper towels but did not accept.  Pt back to bed.

## 2017-01-04 NOTE — Progress Notes (Signed)
Benjamin [name] with [employer]PS called to file report. TTS made report based on collateral information obtained from the pt's brother at the time of the assessment. Pt has not been taking the children to school. Teachers are reportedly concerned. Pt is refusing to eat, speak, or  drink water. Pt's brother reports this began about 2 weeks ago. Unclear if the pt has also not been feeding his 3 children whom also live in the home with the pt. CPS report made to ensure safety of the children.   Benjamin [name], MSW, LCSWA TTS Specialist [phone]

## 2017-01-04 NOTE — Progress Notes (Signed)
Per psychiatrist request, patient to be referred to [facility] for ECT. CSW contacted [facility] and inquired about bed availability, TTS reported that they are currently at capacity and agreed to review patient later.    Celso SickleKimberly Finola Rosal, LCSWA [facility]  Clinical Social Worker [phone]

## 2017-01-04 NOTE — BH Assessment (Addendum)
Tele Assessment Note   Benjamin [name] is an 36 y.o. male who presents to the ED under IVC initiated by his brother. TTS contacted the pt's brother in order to obtain collateral information. Pt's brother reports the pt has done this 4 times in the past and it is usually triggered by stress. Pt's brother reports the pt has disclosed to him, while he is not in the catatonic state, that he does this because he feels as though he is making a sacrifice to God by not eating, drinking, or speaking. Pt's brother reports that he feels if his brother does not get help he is going to "starve himself to death."   Pt's brother reports he originally became concerned because the teachers of the pt's children began calling the pt's brother concerned about the 3 children that live in the home with the pt. The pt reportedly stopped taking the children to school. The pt's brother reports the children are currently living with a family friend and have been for the past several days due to the pt's current state. CPS report to be filed due to the possibility of neglect as the pt's brother reports the pt has not been eating, drinking, or sleeping and also stopped taking the children to school. Pt's brother reports the last time the pt had an episode such as this, the pt did not eat or drink anything for 28 days and he did not come out of his catatonic state until he received help at an inpt hospital. Pt's brother reports the pt's last episode began when the pt was trying to gain full custody of his children.  During the assessment, the pt was completely catatonic during the assessment. The pt sat up in the bed but did not respond when the assessor called his name. Pt did not make eye contact and refused to engage with the assessor.  Per Nira Conn, NP pt will need an AM psych eval to determine final disposition. Consuella Lose, RN notified of the recommendation.   Diagnosis: MDD w/ Catatonia, recurrent   Past Medical History:  Past  Medical History:  Diagnosis Date  . Depression   . Herniated disc    x3  . Hypertension   . Mental disorder     Past Surgical History:  Procedure Laterality Date  . SHOULDER SURGERY      Family History: No family history on file.  Social History:  reports that he quit smoking about 4 years ago. His smoking use included Cigarettes. He smoked 1.00 pack per day. He has never used smokeless tobacco. He reports that he does not drink alcohol or use drugs.  Additional Social History:  Alcohol / Drug Use Pain Medications: See MAR Prescriptions: See MAR Over the Counter: See MAR History of alcohol / drug use?: No history of alcohol / drug abuse  CIWA: CIWA-Ar BP: (!) 143/97 Pulse Rate: 71 COWS:    PATIENT STRENGTHS: (choose at least two) Capable of independent living Religious Affiliation Supportive family/friends  Allergies: No Known Allergies  Home Medications:  (Not in a hospital admission)  OB/GYN Status:  No LMP for male patient.  General Assessment Data Location of Assessment: [facility] TTS Assessment: In system Is this a Tele or Face-to-Face Assessment?: Face-to-Face Is this an Initial Assessment or a Re-assessment for this encounter?: Initial Assessment Marital status: Single Is patient pregnant?: No Pregnancy Status: No Living Arrangements: Alone, Children (per chart) Can pt return to current living arrangement?: Yes Admission Status: Involuntary Is patient capable  of signing voluntary admission?: No Referral Source: Self/Family/Friend Insurance type: none     Crisis Care Plan Living Arrangements: Alone, Children (per chart) Name of Psychiatrist: none reported Name of Therapist: none reported  Education Status Is patient currently in school?: No Highest grade of school patient has completed: unknown  Risk to self with the past 6 months Suicidal Ideation:  (UTA) Has patient been a risk to self within the past 6 months prior to admission? : Yes (due to  pt not eating or drinking water per IVC reports) Suicidal Intent:  (UTA) Has patient had any suicidal intent within the past 6 months prior to admission? :  (UTA) Is patient at risk for suicide?:  (UTA) Suicidal Plan?:  (UTA) Has patient had any suicidal plan within the past 6 months prior to admission? :  (UTA) Access to Means:  (UTA) What has been your use of drugs/alcohol within the last 12 months?: unknown Previous Attempts/Gestures:  (UTA) Triggers for Past Attempts: Unknown Intentional Self Injurious Behavior:  (UTA) Family Suicide History: Unknown Recent stressful life event(s): Other (Comment) (RN reports pt in custody battle for his children) Persecutory voices/beliefs?:  (UTA) Depression: Yes Depression Symptoms: Insomnia, Loss of interest in usual pleasures, Isolating (per IVC, collateral information) Substance abuse history and/or treatment for substance abuse?:  (UTA) Suicide prevention information given to non-admitted patients: Not applicable  Risk to Others within the past 6 months Homicidal Ideation:  (UTA) Does patient have any lifetime risk of violence toward others beyond the six months prior to admission? : Unknown Thoughts of Harm to Others:  (UTA) Current Homicidal Intent:  (UTA) Current Homicidal Plan:  (UTA) Access to Homicidal Means:  (UTA) History of harm to others?:  (UTA) Assessment of Violence: None Noted Violent Behavior Description: none presented Does patient have access to weapons?:  (UTA) Criminal Charges Pending?:  (UTA) Does patient have a court date:  (UTA) Is patient on probation?: Unknown  Psychosis Hallucinations:  (UTA) Delusions:  (UTA)  Mental Status Report Appearance/Hygiene: In scrubs Eye Contact: Poor Motor Activity: Other (Comment) (pt did not move) Speech: Elective mutism Level of Consciousness: Unable to assess (not responding) Mood: Other (Comment) (UTA) Affect: Constricted, Flat Anxiety Level:  (UTA) Thought Processes:  Unable to Assess Judgement: Unable to Assess Orientation: Unable to assess Obsessive Compulsive Thoughts/Behaviors: Unable to Assess  Cognitive Functioning Concentration: Unable to Assess Memory: Unable to Assess IQ: Average Insight: Unable to Assess Impulse Control: Unable to Assess Appetite: Poor Weight Loss: 30 Sleep: Unable to Assess Total Hours of Sleep:  (UTA) Vegetative Symptoms: Decreased grooming, Not bathing  ADLScreening [facility] Services) Patient's cognitive ability adequate to safely complete daily activities?: No Patient able to express need for assistance with ADLs?: No Independently performs ADLs?: Yes (appropriate for developmental age)  Prior Inpatient Therapy Prior Inpatient Therapy: Yes Prior Therapy Dates: 2018, 2017, 2014 Prior Therapy Facilty/Provider(s): [facility], [facility] Reason for Treatment: MDD  Prior Outpatient Therapy Prior Outpatient Therapy:  (UTA) Does patient have an ACCT team?: Unknown Does patient have Intensive In-House Services?  : Unknown Does patient have [facility] services? : Unknown Does patient have P4CC services?: Unknown  ADL Screening (condition at time of admission) Patient's cognitive ability adequate to safely complete daily activities?: No Is the patient deaf or have difficulty hearing?: No Does the patient have difficulty seeing, even when wearing glasses/contacts?: No Does the patient have difficulty concentrating, remembering, or making decisions?: Yes Patient able to express need for assistance with ADLs?: No Does the patient have  difficulty dressing or bathing?: No Independently performs ADLs?: Yes (appropriate for developmental age) Does the patient have difficulty walking or climbing stairs?: No Weakness of Legs: None Weakness of Arms/Hands: None  Home Assistive Devices/Equipment Home Assistive Devices/Equipment: None    Abuse/Neglect Assessment (Assessment to be complete while patient is alone) Physical Abuse:  Denies (per chart) Verbal Abuse: Denies (per chart) Sexual Abuse: Denies (per chart) Exploitation of patient/patient's resources: Denies (per chart) Self-Neglect: Denies (per chart)     Advance Directives (For Healthcare) Does Patient Have a Medical Advance Directive?: No (per chart) Would patient like information on creating a medical advance directive?: No - Patient declined    Additional Information 1:1 In Past 12 Months?: No CIRT Risk: No Elopement Risk: No Does patient have medical clearance?: Yes     Disposition:  Disposition Initial Assessment Completed for this Encounter: Yes Disposition of Patient: Other dispositions Other disposition(s): Other (Comment) (AM psych eval per Nira ConnJason Berry, NP)  Benjamin [name] 01/04/2017 12:35 AM

## 2017-01-04 NOTE — ED Notes (Signed)
Patient getting up and walking around the room slowly, then going back to bed and lying down.

## 2017-01-04 NOTE — ED Notes (Addendum)
Pt sitting on bedside.  Pt took approx. 3 sips of water.

## 2017-01-05 ENCOUNTER — Inpatient Hospital Stay
Admission: EM | Admit: 2017-01-05 | Discharge: 2017-01-16 | DRG: 885 | Disposition: A | Discharge status: Home / Self Care | Payer: No Typology Code available for payment source | Attending: Psychiatry | Admitting: Psychiatry

## 2017-01-05 ENCOUNTER — Encounter: Payer: Self-pay | Admitting: *Deleted

## 2017-01-05 DIAGNOSIS — Z87891 Personal history of nicotine dependence: Secondary | ICD-10-CM

## 2017-01-05 DIAGNOSIS — F332 Major depressive disorder, recurrent severe without psychotic features: Secondary | ICD-10-CM | POA: Diagnosis present

## 2017-01-05 DIAGNOSIS — F333 Major depressive disorder, recurrent, severe with psychotic symptoms: Secondary | ICD-10-CM | POA: Diagnosis present

## 2017-01-05 DIAGNOSIS — F4310 Post-traumatic stress disorder, unspecified: Secondary | ICD-10-CM | POA: Diagnosis present

## 2017-01-05 DIAGNOSIS — I10 Essential (primary) hypertension: Secondary | ICD-10-CM | POA: Diagnosis present

## 2017-01-05 DIAGNOSIS — F339 Major depressive disorder, recurrent, unspecified: Secondary | ICD-10-CM

## 2017-01-05 DIAGNOSIS — F1220 Cannabis dependence, uncomplicated: Secondary | ICD-10-CM | POA: Diagnosis present

## 2017-01-05 DIAGNOSIS — F429 Obsessive-compulsive disorder, unspecified: Secondary | ICD-10-CM | POA: Diagnosis present

## 2017-01-05 DIAGNOSIS — F061 Catatonic disorder due to known physiological condition: Secondary | ICD-10-CM | POA: Diagnosis present

## 2017-01-05 LAB — CBG MONITORING, ED: GLUCOSE-CAPILLARY: 89 mg/dL (ref 65–99)

## 2017-01-05 MED ORDER — LORAZEPAM 2 MG/ML IJ SOLN
2.0000 mg | INTRAMUSCULAR | Status: DC
  Administered 2017-01-05 (×2): 2 mg via INTRAMUSCULAR
  Filled 2017-01-05 (×2): qty 1

## 2017-01-05 MED ORDER — ALUM & MAG HYDROXIDE-SIMETH 200-200-20 MG/5ML PO SUSP: 30.0000 mL | ORAL | Status: DC | PRN

## 2017-01-05 MED ORDER — ACETAMINOPHEN 325 MG PO TABS: 650.0000 mg | ORAL_TABLET | Freq: Four times a day (QID) | ORAL | Status: DC | PRN

## 2017-01-05 MED ORDER — MAGNESIUM HYDROXIDE 400 MG/5ML PO SUSP: 30.0000 mL | Freq: Every day | ORAL | Status: DC | PRN

## 2017-01-05 MED ORDER — LORAZEPAM 2 MG/ML IJ SOLN
1.0000 mg | INTRAMUSCULAR | Status: DC
  Administered 2017-01-05 – 2017-01-06 (×3): 1 mg via INTRAMUSCULAR
  Filled 2017-01-05 (×3): qty 1

## 2017-01-05 NOTE — BH Assessment (Signed)
Pt chart under review by Dr.Clapacs for possible [facility] BMU admission.  

## 2017-01-05 NOTE — BHH Group Notes (Signed)
B[facility] Group Notes:  (Nursing/MHT/Case Management/Adjunct)  Date:  01/05/2017  Time:  11:13 PM  Type of Therapy:  Psychoeducational Skills  Participation Level:  Did Not Attend   Foy GuadalajaraJasmine R Amyre Segundo 01/05/2017, 11:13 PM

## 2017-01-05 NOTE — BH Assessment (Signed)
Pt accepted to [facility] BM[facility] per Dr.Clapacs. Bed assignment currently pending.

## 2017-01-05 NOTE — BHH Counselor (Signed)
Clinician referred the pt to the following inpatient treatment facilities:   [facility]   TTS will continue to follow up and seek placement.  Gwinda Passereylese D Bennett, MS, [facility], [facility] Triage Specialist [phone]

## 2017-01-05 NOTE — BH Assessment (Signed)
BHH Assessment Progress Note  Per Thedore MinsMojeed Akintayo, MD, this pt requires psychiatric hospitalization; he would benefit from admission to a facility that offers ECT.  Malva LimesLinsey Strader, RN, [facility] reports that pt has been accepted to [facility] by Dr Toni Amendlapacs; he has not yet been assigned to a room.  Pt presents under IVC initiated by his brother, and upheld by Kathryne SharperSyed Arfeen, MD, and IVC documents have been faxed to [phone].  Pt's nurse has been notified, and agrees to call report to [phone].  [facility] [place] [facility]   Doylene Canninghomas Shakena Callari, MA Triage Specialist [phone]

## 2017-01-05 NOTE — Progress Notes (Signed)
Patient admitted to unit. Alert and orient x 4. Pt reports anhedonia and not talking with kids anymore as to why admitted. [facility] not talking to them. Pt reports catatonia. Reports he was feeding them and dressing them but not talking. Pt requests ECT and ativan. Reports ativan helps him with catatonia.. Denies SI, HI, AVH. Admission assessment completed and witnessed by Ty, Charity fundraiserN. No contraband found, patient has red bumps to bilateral feet and ankles. Oriented patient to room and unit. Fluid and nutrition offered. Pt safe on unit with q 15 min checks.

## 2017-01-05 NOTE — Consult Note (Signed)
[facility] Face-to-Face Psychiatry Consult   Reason for Consult:  Depression with catatonia symptoms Referring Physician:  EDP Patient Identification: [name] MRN:  [id number] Principal Diagnosis: MDD (major depressive disorder), recurrent, with catatonic features [facility]) Diagnosis:   Patient Active Problem List   Diagnosis Date Noted  . MDD (major depressive disorder), recurrent, with catatonic features ([place]) [F33.9, F06.1] 08/09/2016    Priority: High  . Severe recurrent major depression with psychotic features ([place]) [F33.3] 07/21/2016  . MDD (major depressive disorder) [F32.9] 07/21/2016  . PTSD (post-traumatic stress disorder) [F43.10] 07/15/2016  . OCD (obsessive compulsive disorder) [F42.9] 07/15/2016  . 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 Bradycardia [R00.1] 09/18/2012  . Decreased oral intake [R63.8] 09/17/2012  . Psychosis [F29] 09/09/2012  . Severe episode of recurrent major depressive disorder, with psychotic features ([place]) [F33.3] 09/08/2012  . History of substance abuse [Z87.898] 09/08/2012    Total Time spent with patient: 45 minutes  Subjective:   Benjamin [name] is a 36 y.o. male patient admitted with severe depression, not eating or drinking, catatonic state.  HPI:  36 yo male admitted for not drinking or eating.  The school called his brother about his 3 children not being there.  Benjamin [name] was in a catatonic state and not eating or drinking.  CPS notified.  This has happened in the past when he was trying to get custody of his children.  He was hospitalized several times in December and January but will not go to follow-up appointments or continue his medications.  One time after discharging from the ED, he threw his prescriptions away prior to leaving.  Currently, not eating or drinking or taking medications.  No hallucinations or homicidal ideations.  Past Psychiatric History: depression  Risk to Self: Yes Risk to  Others: NOne Prior Inpatient Therapy: Prior Inpatient Therapy: Yes Prior Therapy Dates: 2018, 2017, 2014 Prior Therapy Facilty/Provider(s): [facility], [facility] Reason for Treatment: MDD Prior Outpatient Therapy: Prior Outpatient Therapy:  (UTA) Does patient have an ACCT team?: Unknown Does patient have Intensive In-House Services?  : Unknown Does patient have [facility] services? : Unknown Does patient have P4CC services?: Unknown  Past Medical History:  Past Medical History:  Diagnosis Date  . Depression   . Herniated disc    x3  . Hypertension   . Mental disorder     Past Surgical History:  Procedure Laterality Date  . SHOULDER SURGERY     Family History: No family history on file. Family Psychiatric  History: depression Social History:  History  Alcohol Use No    Comment: Pt is nonverbal. Unknown.      History  Drug Use No    Comment: Pt is nonverbal. Unknown.     Social History   Social History  . Marital status: Single    Spouse name: N/A  . Number of children: N/A  . Years of education: N/A   Social History Main Topics  . Smoking status: Former Smoker    Packs/day: 1.00    Types: Cigarettes    Quit date: 04/04/2012  . Smokeless tobacco: Never Used     Comment: Pt is nonverbal. Unknown.   . Alcohol use No     Comment: Pt is nonverbal. Unknown.   . Drug use: No     Comment: Pt is nonverbal. Unknown.   Marland Kitchen Sexual activity: Not Asked   Other Topics Concern  . None   Social History Narrative  . None   Additional Social  History:    Allergies:  No Known Allergies  Labs:  Results for orders placed or performed during the hospital encounter of 01/03/17 (from the past 48 hour(s))  Comprehensive metabolic panel     Status: Abnormal   Collection Time: 01/03/17 10:26 PM  Result Value Ref Range   Sodium 141 135 - 145 mmol/L   Potassium 3.7 3.5 - 5.1 mmol/L   Chloride 99 (L) 101 - 111 mmol/L   CO2 24 22 - 32 mmol/L   Glucose, Bld 94 65 - 99 mg/dL   BUN 34 (H) 6 - 20  mg/dL   Creatinine, Ser 1.18 0.61 - 1.24 mg/dL   Calcium 10.2 8.9 - 10.3 mg/dL   Total Protein 9.1 (H) 6.5 - 8.1 g/dL   Albumin 5.5 (H) 3.5 - 5.0 g/dL   AST 54 (H) 15 - 41 U/L   ALT 28 17 - 63 U/L   Alkaline Phosphatase 42 38 - 126 U/L   Total Bilirubin 1.6 (H) 0.3 - 1.2 mg/dL   GFR calc non Af Amer >60 >60 mL/min   GFR calc Af Amer >60 >60 mL/min    Comment: (NOTE) The eGFR has been calculated using the CKD EPI equation. This calculation has not been validated in all clinical situations. eGFR's persistently <60 mL/min signify possible Chronic Kidney Disease.    Anion gap 18 (H) 5 - 15  Ethanol     Status: None   Collection Time: 01/03/17 10:26 PM  Result Value Ref Range   Alcohol, Ethyl (B) <5 <5 mg/dL    Comment:        LOWEST DETECTABLE LIMIT FOR SERUM ALCOHOL IS 5 mg/dL FOR MEDICAL PURPOSES ONLY   Urine rapid drug screen (hosp performed)     Status: Abnormal   Collection Time: 01/03/17 10:26 PM  Result Value Ref Range   Opiates NONE DETECTED NONE DETECTED   Cocaine NONE DETECTED NONE DETECTED   Benzodiazepines NONE DETECTED NONE DETECTED   Amphetamines NONE DETECTED NONE DETECTED   Tetrahydrocannabinol POSITIVE (A) NONE DETECTED   Barbiturates NONE DETECTED NONE DETECTED    Comment:        DRUG SCREEN FOR MEDICAL PURPOSES ONLY.  IF CONFIRMATION IS NEEDED FOR ANY PURPOSE, NOTIFY LAB WITHIN 5 DAYS.        LOWEST DETECTABLE LIMITS FOR URINE DRUG SCREEN Drug Class       Cutoff (ng/mL) Amphetamine      1000 Barbiturate      200 Benzodiazepine   789 Tricyclics       381 Opiates          300 Cocaine          300 THC              50   CBC with Diff     Status: Abnormal   Collection Time: 01/03/17 10:26 PM  Result Value Ref Range   WBC 9.2 4.0 - 10.5 K/uL   RBC 6.00 (H) 4.22 - 5.81 MIL/uL   Hemoglobin 18.9 (H) 13.0 - 17.0 g/dL   HCT 50.6 39.0 - 52.0 %   MCV 84.3 78.0 - 100.0 fL   MCH 31.5 26.0 - 34.0 pg   MCHC 37.4 (H) 30.0 - 36.0 g/dL    Comment:  SPHEROCYTES RULED OUT INTERFERING SUBSTANCES    RDW 12.5 11.5 - 15.5 %   Platelets 226 150 - 400 K/uL   Neutrophils Relative % 64 %   Neutro Abs 5.9 1.7 - 7.7 K/uL  Lymphocytes Relative 27 %   Lymphs Abs 2.5 0.7 - 4.0 K/uL   Monocytes Relative 7 %   Monocytes Absolute 0.6 0.1 - 1.0 K/uL   Eosinophils Relative 2 %   Eosinophils Absolute 0.2 0.0 - 0.7 K/uL   Basophils Relative 0 %   Basophils Absolute 0.0 0.0 - 0.1 K/uL  Acetaminophen level     Status: Abnormal   Collection Time: 01/03/17 10:26 PM  Result Value Ref Range   Acetaminophen (Tylenol), Serum <10 (L) 10 - 30 ug/mL    Comment:        THERAPEUTIC CONCENTRATIONS VARY SIGNIFICANTLY. A RANGE OF 10-30 ug/mL MAY BE AN EFFECTIVE CONCENTRATION FOR MANY PATIENTS. HOWEVER, SOME ARE BEST TREATED AT CONCENTRATIONS OUTSIDE THIS RANGE. ACETAMINOPHEN CONCENTRATIONS >150 ug/mL AT 4 HOURS AFTER INGESTION AND >50 ug/mL AT 12 HOURS AFTER INGESTION ARE OFTEN ASSOCIATED WITH TOXIC REACTIONS.   Salicylate level     Status: None   Collection Time: 01/03/17 10:26 PM  Result Value Ref Range   Salicylate Lvl <7.4 2.8 - 30.0 mg/dL  CBG monitoring, ED     Status: None   Collection Time: 01/04/17  4:04 PM  Result Value Ref Range   Glucose-Capillary 75 65 - 99 mg/dL   Comment 1 Notify RN    Comment 2 Document in Chart   POC CBG, ED     Status: None   Collection Time: 01/04/17  6:51 PM  Result Value Ref Range   Glucose-Capillary 91 65 - 99 mg/dL  CBG monitoring, ED     Status: None   Collection Time: 01/04/17  9:48 PM  Result Value Ref Range   Glucose-Capillary 87 65 - 99 mg/dL  CBG monitoring, ED     Status: None   Collection Time: 01/05/17  8:03 AM  Result Value Ref Range   Glucose-Capillary 89 65 - 99 mg/dL    Current Facility-Administered Medications  Medication Dose Route Frequency Provider Last Rate Last Dose  . acetaminophen (TYLENOL) tablet 650 mg  650 mg Oral Q4H PRN Larene Pickett, PA-C      . alum & mag  hydroxide-simeth (MAALOX/MYLANTA) 200-200-20 MG/5ML suspension 30 mL  30 mL Oral Q6H PRN Larene Pickett, PA-C      . ARIPiprazole (ABILIFY) tablet 10 mg  10 mg Oral Daily Quincy Carnes M, PA-C      . fluvoxaMINE (LUVOX) tablet 150 mg  150 mg Oral QHS Larene Pickett, PA-C      . hydrOXYzine (ATARAX/VISTARIL) tablet 25 mg  25 mg Oral Q6H PRN Larene Pickett, PA-C      . ibuprofen (ADVIL,MOTRIN) tablet 600 mg  600 mg Oral Q8H PRN Larene Pickett, PA-C      . nicotine (NICODERM CQ - dosed in mg/24 hours) patch 21 mg  21 mg Transdermal Daily Quincy Carnes M, PA-C      . ondansetron [facility]) tablet 4 mg  4 mg Oral Q8H PRN Larene Pickett, PA-C      . traZODone (DESYREL) tablet 100 mg  100 mg Oral QHS PRN Larene Pickett, PA-C       Current Outpatient Prescriptions  Medication Sig Dispense Refill  . ARIPiprazole (ABILIFY) 10 MG tablet Take 1 tablet (10 mg total) by mouth daily. 30 tablet 0  . fluvoxaMINE (LUVOX) 50 MG tablet Take 3 tablets (150 mg total) by mouth at bedtime. 90 tablet 0  . hydrOXYzine (ATARAX/VISTARIL) 25 MG tablet Take 1 tablet (25 mg total) by mouth every  6 (six) hours as needed for anxiety. 30 tablet 0  . traZODone (DESYREL) 100 MG tablet Take 1 tablet (100 mg total) by mouth at bedtime as needed for sleep. 30 tablet 0    Musculoskeletal: Strength & Muscle Tone: within normal limits Gait & Station: normal Patient leans: N/A  Psychiatric Specialty Exam: Physical Exam  Constitutional: He is oriented to person, place, and time. He appears well-developed and well-nourished.  HENT:  Head: Normocephalic.  Neck: Normal range of motion.  Respiratory: Effort normal.  Musculoskeletal: Normal range of motion.  Neurological: He is alert and oriented to person, place, and time.  Psychiatric: Thought content normal. He is withdrawn. Cognition and memory are impaired. He expresses inappropriate judgment. He exhibits a depressed mood. He is noncommunicative.    Review of Systems   Psychiatric/Behavioral: Positive for depression.  All other systems reviewed and are negative.   Blood pressure 117/81, pulse 60, temperature 98.6 F (37 C), temperature source Oral, resp. rate 17, SpO2 100 %.There is no height or weight on file to calculate BMI.  General Appearance: Casual  Eye Contact:  None  Speech:  none  Volume:  none  Mood:  Depressed  Affect:  Congruent  Thought Process:  Unable to assess, nonverbal  Orientation:  Unable to assess, nonverbal  Thought Content:  Unable to assess, nonverbal  Suicidal Thoughts:  Unable to assess, nonverbal but not drinking or eating  Homicidal Thoughts: Unable to assess,nonverbal  Memory:  unable to assess,nonverbal  Judgement:  Impaired  Insight:  unable to assess, nonverbal  Psychomotor Activity:  Decreased  Concentration: Poor and attention span poor   Recall:  Unable to complete, nonverbal  Fund of Knowledge:  Fair from past history  Language:  Negative  Akathisia:  unable to assess, nonverbal  Handed:  Right  AIMS (if indicated):     Assets:  Housing Physical Health Resilience Social Support  ADL's:  Impaired  Cognition:  Impaired,  Severe  Sleep:        Treatment Plan Summary: Daily contact with patient to assess and evaluate symptoms and progress in treatment, Medication management and Plan major depressive disorder, recurrent, severe with catatonic symptoms:  -Crisis stabilization -Medication management:  Started Ativan 2 mg every two hours for catatonia for 24 hours -Individual counseling  Disposition: Recommend psychiatric Inpatient admission when medically cleared.  Waylan Boga, NP 01/05/2017 11:11 AM  Patient seen face-to-face for psychiatric evaluation, chart reviewed and case discussed with the physician extender and developed treatment plan. Reviewed the information documented and agree with the treatment plan. Corena Pilgrim, MD

## 2017-01-05 NOTE — Tx Team (Signed)
Initial Treatment Plan 01/05/2017 6:23 PM Benjamin HensenJoshua [name] ZOX:[id number]    PATIENT STRESSORS: Financial difficulties Loss of children Medication change or noncompliance   PATIENT STRENGTHS: Average or above average intelligence Communication skills Motivation for treatment/growth   PATIENT IDENTIFIED PROBLEMS: Major Depressive Disorder                     DISCHARGE CRITERIA:  Improved stabilization in mood, thinking, and/or behavior  PRELIMINARY DISCHARGE PLAN: Outpatient therapy  PATIENT/FAMILY INVOLVEMENT: This treatment plan has been presented to and reviewed with the patient, Benjamin HensenJoshua [name], and/or family member, .  The patient and family have been given the opportunity to ask questions and make suggestions.  Shelia MediaJones, Langston [place], RN 01/05/2017, 6:23 PM

## 2017-01-05 NOTE — BH Assessment (Signed)
Patient has been [facility] to [facility].  Patient assigned to room 309 by [employer] RN Phyllis. Accepting physician is Dr. Toni Amendlapacs. Attending physician is Dr. Toni Amendlapacs.  Call report to [phone]. Pt RN Satira Anis(Jaqualine) informed of pt acceptance. IVC paperwork requested to be faxed to [phone] prior to pt's transport.    Pt access (Rayna) informed for pt's pre-admit.

## 2017-01-06 ENCOUNTER — Other Ambulatory Visit: Payer: Self-pay | Admitting: Psychiatry

## 2017-01-06 DIAGNOSIS — F332 Major depressive disorder, recurrent severe without psychotic features: Secondary | ICD-10-CM

## 2017-01-06 MED ORDER — ARIPIPRAZOLE 5 MG PO TABS
5.0000 mg | ORAL_TABLET | Freq: Every day | ORAL | Status: DC
  Administered 2017-01-09 – 2017-01-12 (×2): 5 mg via ORAL
  Filled 2017-01-06 (×6): qty 1

## 2017-01-06 MED ORDER — FLUVOXAMINE MALEATE 50 MG PO TABS
100.0000 mg | ORAL_TABLET | Freq: Two times a day (BID) | ORAL | Status: DC
  Administered 2017-01-09 – 2017-01-16 (×8): 100 mg via ORAL
  Filled 2017-01-06 (×12): qty 2
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
## 2017-01-06 NOTE — BHH Suicide Risk Assessment (Signed)
[facility] Admission Suicide Risk Assessment   Nursing information obtained from:  Patient Demographic factors:  Male, Caucasian, Low socioeconomic status, Unemployed Current Mental Status:  NA Loss Factors:  Loss of significant relationship (lost children to social services) Historical Factors:  NA Risk Reduction Factors:  Responsible for children under 38 years of age, Sense of responsibility to family, Living with another person, especially a relative  Total Time spent with patient: 45 minutes Principal Problem: Major depressive disorder, recurrent severe without psychotic features (HCC) Diagnosis:   Patient Active Problem List   Diagnosis Date Noted  . Major depressive disorder, recurrent severe without psychotic features (HCC) [F33.2] 01/05/2017  . MDD (major depressive disorder), recurrent, with catatonic features (HCC) [F33.9, F06.1] 08/09/2016  . Severe recurrent major depression with psychotic features (HCC) [F33.3] 07/21/2016  . MDD (major depressive disorder) [F32.9] 07/21/2016  . PTSD (post-traumatic stress disorder) [F43.10] 07/15/2016  . OCD (obsessive compulsive disorder) [F42.9] 07/15/2016  . 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 Bradycardia [R00.1] 09/18/2012  . Decreased oral intake [R63.8] 09/17/2012  . Psychosis [F29] 09/09/2012  . Severe episode of recurrent major depressive disorder, with psychotic features (HCC) [F33.3] 09/08/2012  . History of substance abuse [Z87.898] 09/08/2012   Subjective Data: 36 year old man with a history of recurrent depression and recurrent episodes of catatonia who was transferred to our hospital from [facility] because of recurrence of catatonic presentation. On interview today the patient did not provide any information or feedback at all. Unclear if he understood her was listening to anything I said to him. Patient had his eyes partially open but didn't make any verbalization or respond to any  direct commands or requests. Patient does have a past history of suicidality and recurrent depression.  Continued Clinical Symptoms:  Alcohol Use Disorder Identification Test Final Score (AUDIT): 0 The "Alcohol Use Disorders Identification Test", Guidelines for Use in Primary Care, Second Edition.  World Science writer [facility]). Score between 0-7:  no or low risk or alcohol related problems. Score between 8-15:  moderate risk of alcohol related problems. Score between 16-19:  high risk of alcohol related problems. Score 20 or above:  warrants further diagnostic evaluation for alcohol dependence and treatment.   CLINICAL FACTORS:   Depression:   Severe   Musculoskeletal: Strength & Muscle Tone: decreased Gait & Station: unable to stand Patient leans: N/A  Psychiatric Specialty Exam: Physical Exam  Nursing note and vitals reviewed. Constitutional: He appears well-developed and well-nourished.  HENT:  Head: Normocephalic and atraumatic.  Eyes: Conjunctivae are normal. Pupils are equal, round, and reactive to light.  Neck: Normal range of motion.  Cardiovascular: Regular rhythm and normal heart sounds.   Respiratory: Effort normal. No respiratory distress.  GI: Soft.  Musculoskeletal: Normal range of motion.  Neurological: He is alert.  Skin: Skin is warm and dry.  Psychiatric: His affect is blunt. His speech is delayed. He is slowed and withdrawn. Cognition and memory are impaired.    Review of Systems  Unable to perform ROS: Psychiatric disorder    Blood pressure 103/75, pulse 83, temperature 98.6 F (37 C), temperature source Oral, resp. rate 18, height 5\' 10"  (1.778 m), weight 74.4 kg (164 lb), SpO2 100 %.Body mass index is 23.53 kg/m.  General Appearance: Casual  Eye Contact:  None  Speech:  Negative  Volume:  Decreased  Mood:  Negative  Affect:  Negative  Thought Process:  Irrelevant  Orientation:  Negative  Thought Content:  Negative  Suicidal Thoughts:  No   Homicidal Thoughts:  No  Memory:  Negative  Judgement:  Negative  Insight:  Negative  Psychomotor Activity:  Negative  Concentration:  Concentration: Poor  Recall:  Negative  Fund of Knowledge:  Negative  Language:  Negative  Akathisia:  Negative  Handed:  Right  AIMS (if indicated):     Assets:  Physical Health  ADL's:  Impaired  Cognition:  Impaired,  Moderate  Sleep:  Number of Hours: 7      COGNITIVE FEATURES THAT CONTRIBUTE TO RISK:  Closed-mindedness    SUICIDE RISK:   Mild:  Suicidal ideation of limited frequency, intensity, duration, and specificity.  There are no identifiable plans, no associated intent, mild dysphoria and related symptoms, good self-control (both objective and subjective assessment), few other risk factors, and identifiable protective factors, including available and accessible social support.  PLAN OF CARE: Patient has been admitted to the hospital because of a return of catatonic presentation. Patient is currently unable to take care of himself at all. There is no indication that he has been actively suicidal this time although he does show poor self-care and has had a past history of suicidal behavior. Patient is going to receive ECT as soon as we can practically make it happen and we'll continue medication and supportive counseling. Continued reevaluation of suicidality prior to discharge  I certify that inpatient services furnished can reasonably be expected to improve the patient's condition.   Mordecai RasmussenJohn Maclovia Uher, MD 01/06/2017, 5:56 PM

## 2017-01-06 NOTE — Progress Notes (Signed)
Recreation Therapy Notes  At approximately 10:40 am, LRT attempted assessment. Patient appeared to be sleeping as he was breathing but did not respond when LRT called his name.  Jacquelynn CreeGreene,Bexton Haak M, LRT/CTRS 01/06/2017 3:51 PM

## 2017-01-06 NOTE — H&P (Signed)
Psychiatric Admission Assessment Adult  Patient Identification: Benjamin [name] MRN:  [id number] Date of Evaluation:  01/06/2017 Chief Complaint:  Depression with Catatonic Features Principal Diagnosis: Major depressive disorder, recurrent severe without psychotic features (HCC) Diagnosis:   Patient Active Problem List   Diagnosis Date Noted  . Major depressive disorder, recurrent severe without psychotic features (HCC) [F33.2] 01/05/2017  . MDD (major depressive disorder), recurrent, with catatonic features (HCC) [F33.9, F06.1] 08/09/2016  . Severe recurrent major depression with psychotic features (HCC) [F33.3] 07/21/2016  . MDD (major depressive disorder) [F32.9] 07/21/2016  . PTSD (post-traumatic stress disorder) [F43.10] 07/15/2016  . OCD (obsessive compulsive disorder) [F42.9] 07/15/2016  . 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 Bradycardia [R00.1] 09/18/2012  . Decreased oral intake [R63.8] 09/17/2012  . Psychosis [F29] 09/09/2012  . Severe episode of recurrent major depressive disorder, with psychotic features (HCC) [F33.3] 09/08/2012  . History of substance abuse [Z87.898] 09/08/2012   History of Present Illness: 36 year old man referred from [facility]. He was brought there a few days ago because of a return of catatonic behavior. Patient was noticed to not be bringing his children to school recently. Authorities checked on them and found the patient to be unresponsive or nearly so. On interview today the patient is not able to give me any information at all. Completely withdrawn. No response verbally or physically. Unclear whether he has been compliant with recent medication. We do know that he has long-standing problems with anxiety and depression. Associated Signs/Symptoms: Depression Symptoms:  hypersomnia, psychomotor retardation, difficulty concentrating, anxiety, (Hypo) Manic Symptoms:  None reported Anxiety  Symptoms:  Excessive Worry, Obsessive Compulsive Symptoms:   By history. No specific report at this time, Psychotic Symptoms:  Catatonia PTSD Symptoms: Negative Total Time spent with patient: 45 minutes  Past Psychiatric History: Patient has a history of depression and also a history of OCD. History of past substance abuse issues thought to be under good control. Patient has a history of catatonic-like presentations in the past but had responded to ECT treatment.  Is the patient at risk to self? Yes.    Has the patient been a risk to self in the past 6 months? Yes.    Has the patient been a risk to self within the distant past? Yes.    Is the patient a risk to others? No.  Has the patient been a risk to others in the past 6 months? No.  Has the patient been a risk to others within the distant past? No.   Prior Inpatient Therapy:   Prior Outpatient Therapy:    Alcohol Screening: 1. How often do you have a drink containing alcohol?: Never 9. Have you or someone else been injured as a result of your drinking?: No 10. Has a relative or friend or a doctor or another health worker been concerned about your drinking or suggested you cut down?: No Alcohol Use Disorder Identification Test Final Score (AUDIT): 0 Brief Intervention: AUDIT score less than 7 or less-screening does not suggest unhealthy drinking-brief intervention not indicated Substance Abuse History in the last 12 months:  No. Consequences of Substance Abuse: Negative Previous Psychotropic Medications: Yes  Psychological Evaluations: Yes  Past Medical History:  Past Medical History:  Diagnosis Date  . Depression   . Herniated disc    x3  . Hypertension   . Mental disorder     Past Surgical History:  Procedure Laterality Date  . SHOULDER SURGERY  Family History: History reviewed. No pertinent family history. Family Psychiatric  History: Positive history of anxiety Tobacco Screening: Have you used any form of tobacco  in the last 30 days? (Cigarettes, Smokeless Tobacco, Cigars, and/or Pipes): No Social History:  History  Alcohol Use No    Comment: Pt is nonverbal. Unknown.      History  Drug Use No    Comment: Pt is nonverbal. Unknown.     Additional Social History: Marital status: Single Are you sexually active?: No Does patient have children?: Yes How many children?: 2 How is patient's relationship with their children?: 38 years old and 38 years old: overall positive, but patient has not been attentive to children. School has been calling as children have not been attending.  Also documented patient has not been feeding children                         Allergies:  No Known Allergies Lab Results:  Results for orders placed or performed during the hospital encounter of 01/03/17 (from the past 48 hour(s))  POC CBG, ED     Status: None   Collection Time: 01/04/17  6:51 PM  Result Value Ref Range   Glucose-Capillary 91 65 - 99 mg/dL  CBG monitoring, ED     Status: None   Collection Time: 01/04/17  9:48 PM  Result Value Ref Range   Glucose-Capillary 87 65 - 99 mg/dL  CBG monitoring, ED     Status: None   Collection Time: 01/05/17  8:03 AM  Result Value Ref Range   Glucose-Capillary 89 65 - 99 mg/dL    Blood Alcohol level:  Lab Results  Component Value Date   ETH <5 01/03/2017   ETH <5 01/01/2017    Metabolic Disorder Labs:  Lab Results  Component Value Date   HGBA1C 5.2 07/15/2016   MPG 103 07/15/2016   No results found for: PROLACTIN Lab Results  Component Value Date   CHOL 187 07/15/2016   TRIG 86 07/15/2016   HDL 30 (L) 07/15/2016   CHOLHDL 6.2 07/15/2016   VLDL 17 07/15/2016   LDLCALC 140 (H) 07/15/2016    Current Medications: Current Facility-Administered Medications  Medication Dose Route Frequency Provider Last Rate Last Dose  . acetaminophen (TYLENOL) tablet 650 mg  650 mg Oral Q6H PRN Charm RingsLord, Jamison Y, NP      . alum & mag hydroxide-simeth (MAALOX/MYLANTA)  200-200-20 MG/5ML suspension 30 mL  30 mL Oral Q4H PRN Charm RingsLord, Jamison Y, NP      . LORazepam (ATIVAN) injection 1 mg  1 mg Intramuscular Q4H while awake Charm RingsLord, Jamison Y, NP   1 mg at 01/06/17 1113  . magnesium hydroxide (MILK OF MAGNESIA) suspension 30 mL  30 mL Oral Daily PRN Charm RingsLord, Jamison Y, NP       PTA Medications: Prescriptions Prior to Admission  Medication Sig Dispense Refill Last Dose  . ARIPiprazole (ABILIFY) 10 MG tablet Take 1 tablet (10 mg total) by mouth daily. 30 tablet 0 unk  . fluvoxaMINE (LUVOX) 50 MG tablet Take 3 tablets (150 mg total) by mouth at bedtime. 90 tablet 0 unk  . hydrOXYzine (ATARAX/VISTARIL) 25 MG tablet Take 1 tablet (25 mg total) by mouth every 6 (six) hours as needed for anxiety. 30 tablet 0 unk  . traZODone (DESYREL) 100 MG tablet Take 1 tablet (100 mg total) by mouth at bedtime as needed for sleep. 30 tablet 0 unk    Musculoskeletal: Strength &  Muscle Tone: Hard to tell. Patient doesn't voluntarily move at all. On examination his arms move normally or with only a little bit of stiffness. Gait & Station: unable to stand Patient leans: N/A  Psychiatric Specialty Exam: Physical Exam  Nursing note and vitals reviewed. Constitutional: He appears well-developed and well-nourished.  HENT:  Head: Normocephalic and atraumatic.  Eyes: Conjunctivae are normal. Pupils are equal, round, and reactive to light.  Neck: Normal range of motion.  Cardiovascular: Normal heart sounds.   Respiratory: Effort normal.  GI: Soft.  Musculoskeletal: Normal range of motion.  Neurological: He is alert.  Skin: Skin is warm and dry.    Review of Systems  Unable to perform ROS: Psychiatric disorder    Blood pressure 103/75, pulse 83, temperature 98.6 F (37 C), temperature source Oral, resp. rate 18, height 5\' 10"  (1.778 m), weight 74.4 kg (164 lb), SpO2 100 %.Body mass index is 23.53 kg/m.  General Appearance: Casual  Eye Contact:  None  Speech:  Negative  Volume:   Decreased  Mood:  Negative  Affect:  Negative  Thought Process:  Irrelevant  Orientation:  Negative  Thought Content:  Negative  Suicidal Thoughts:  No  Homicidal Thoughts:  No  Memory:  Negative  Judgement:  Negative  Insight:  Negative  Psychomotor Activity:  Negative  Concentration:  Concentration: Poor  Recall:  Negative  Fund of Knowledge:  Negative  Language:  Negative  Akathisia:  Negative  Handed:  Right  AIMS (if indicated):     Assets:  Financial Resources/Insurance Housing  ADL's:  Impaired  Cognition:  Impaired,  Moderate  Sleep:  Number of Hours: 7    Treatment Plan Summary: Daily contact with patient to assess and evaluate symptoms and progress in treatment, Medication management and Plan    Observation Level/Precautions:  Daily contact with patient to assess and evaluate symptoms and progress in treatment, Medication management and Plan Patient was admitted to our facility specifically because of previous treatment with ECT. Today I was unable to engage the patient in any kind of conversation. I would like to proceed with ECT for his catatonia but if he is not able to give consent we will be delayed in doing that. I will review his medicine for now. Spoke with nursing today. We want to keep a close eye on whether he is eating and drinking. We will continue to do daily monitoring of his behavior while we try to reach definitive treatment.  Laboratory:  Chemistry Profile  Psychotherapy:    Medications:    Consultations:    Discharge Concerns:    Estimated LOS:  Other:     Physician Treatment Plan for Primary Diagnosis: Major depressive disorder, recurrent severe without psychotic features (HCC) Long Term Goal(s): Improvement in symptoms so as ready for discharge  Short Term Goals: Ability to demonstrate self-control will improve and Compliance with prescribed medications will improve  Physician Treatment Plan for Secondary Diagnosis: Principal Problem:   Major  depressive disorder, recurrent severe without psychotic features (HCC)  Long Term Goal(s): Improvement in symptoms so as ready for discharge  Short Term Goals: Ability to verbalize feelings will improve  I certify that inpatient services furnished can reasonably be expected to improve the patient's condition.    [name], MD 6/5/20185:59 PM

## 2017-01-06 NOTE — Plan of Care (Signed)
Problem: Activity: Goal: Sleeping patterns will improve Outcome: Progressing Patient slept for Estimated Hours of 7; every 15 minutes safety round maintained, no injury or falls during this shift.    

## 2017-01-06 NOTE — Progress Notes (Signed)
Pt remains mute. RN was unable to assess pt. Pt seen up and walking around unit. He does not respond or make eye contact with with staff. Pt was compliant with 1100 dose of IM ativan. Will continue to monitor.

## 2017-01-06 NOTE — [facility] Suicide Risk Assessment (Signed)
[facility] INPATIENT:  Family/Significant Other Suicide Prevention Education  Suicide Prevention Education:  Education Completed; NONE,    Patient did not endorse SI on admission or factors to admission. Suicide Risk assessment list : Suicide Risk:  Minimal: No identifiable suicidal ideation.  Patients presenting with no risk factors but with morbid ruminations; may be classified as minimal risk based on the severity of the depressive symptoms  Identified family member aware of issues and suicide education:  Lockie Paresndy Iovine  Jakeria Caissie N 01/06/2017, 12:00 PM

## 2017-01-06 NOTE — Progress Notes (Signed)
Patient ID: Benjamin HensenJoshua Helt, male   DOB: 05/19/1981, 36 y.o.   MRN: [id number] Pleasant on approach, engaged in a dialogue, appropriate to situation, walks about the unit after IM Ativan 1 mg; denied SI/HI/AVH, no catatonic conditions or mutism noted.

## 2017-01-06 NOTE — Progress Notes (Signed)
Recreation Therapy Notes  Date: 06.05.18 Time: 9:30 am Location: Craft Room  Group Topic: Coping Skills  Goal Area(s) Addresses:  Patient will write healthy coping skills. Patient will verbalize benefit of using healthy coping skills.  Behavioral Response: Did not attend  Intervention: Coping Skills Alphabet  Activity: Patients were given a Coping Skills Alphabet worksheet and were instructed to write healthy coping skills for each letter of the alphabet.  Education: LRT educated patients on healthy coping skills.  Education Outcome: Patient did not attend group.  Clinical Observations/Feedback: Patient did not attend group.  Jacquelynn CreeGreene,Shray Hunley M, LRT/CTRS 01/06/2017 10:14 AM

## 2017-01-06 NOTE — [facility] Group Notes (Signed)
[facility] LCSW Group Therapy  01/06/2017 3:51 PM  Type of Therapy:  Group Therapy  " Feelings around Diagnosis"   Participation Level:  Did Not Attend    Shauna Bodkins N 01/06/2017, 3:51 PM  

## 2017-01-06 NOTE — Progress Notes (Signed)
NUTRITION ASSESSMENT  Pt identified as at risk on the Malnutrition Screen Tool  INTERVENTION: Monitor for needs  NUTRITION DIAGNOSIS: Inadequate oral intake related too poor appetite (catatonia, lethargy) as evidenced by pt report.   Goal: Pt to meet >/= 90% of their estimated nutrition needs.  Monitor:  PO intake  Assessment: Benjamin [name] is a 36 yo male with a history of recurrent MDD with catatonic features. This morning patient would not awaken for me. PO intake 0% thus far, doesn't appear to be eating, as he was doing PTA. Exhibits a 15#/8.4% insignificant wt loss over 5 months. Unable to provide interventions at this time. Catatonia of course, is patient's principle problem, which impacts nutrition directly.  Height: Ht Readings from Last 1 Encounters:  01/05/17 5\' 10"  (1.778 m)    Weight: Wt Readings from Last 1 Encounters:  01/05/17 164 lb (74.4 kg)    Weight Hx: Wt Readings from Last 10 Encounters:  01/05/17 164 lb (74.4 kg)  08/09/16 179 lb (81.2 kg)  08/08/16 180 lb (81.6 kg)  07/25/16 180 lb (81.6 kg)  07/18/16 180 lb (81.6 kg)  09/20/12 145 lb (65.8 kg)  12/14/11 179 lb (81.2 kg)    BMI:  Body mass index is 23.53 kg/m. Pt meets criteria for normal based on current BMI.  Estimated Nutritional Needs: Kcal: 25-30 kcal/kg Protein: > 1 gram protein/kg Fluid: 1 ml/kcal  Diet Order: Diet Heart Room service appropriate? Yes; Fluid consistency: Thin Pt is also offered choice of unit snacks mid-morning and mid-afternoon.  Pt is eating as desired.   Lab results and medications reviewed.   Benjamin AnoWilliam M. Bhumi Godbey, MS, RD LDN Inpatient Clinical Dietitian Pager [phone]

## 2017-01-06 NOTE — Plan of Care (Signed)
Problem: Safety: Goal: Ability to remain free from injury will improve Outcome: Progressing Pt remains safe during hospital stay    

## 2017-01-07 LAB — GLUCOSE, CAPILLARY: GLUCOSE-CAPILLARY: 114 mg/dL — AB (ref 65–99)

## 2017-01-07 MED ORDER — LORAZEPAM 2 MG/ML IJ SOLN
2.0000 mg | INTRAMUSCULAR | Status: DC
  Administered 2017-01-07 – 2017-01-08 (×2): 2 mg via INTRAMUSCULAR
  Filled 2017-01-07 (×2): qty 1

## 2017-01-07 NOTE — Plan of Care (Signed)
Problem: Coping: Goal: Ability to verbalize frustrations and anger appropriately will improve Outcome: Progressing Patient using selective mutism

## 2017-01-07 NOTE — Plan of Care (Signed)
Problem: Activity: Goal: Sleeping patterns will improve Outcome: Progressing Patient slept for Estimated Hours of 8.30; every 15 minutes safety round maintained, no injury or falls during this shift.    

## 2017-01-07 NOTE — Progress Notes (Signed)
Recreation Therapy Notes  Date: 06.06.18 Time: 9:30 am Location: Craft Room  Group Topic: Self-esteem  Goal Area(s) Addresses: Patient will write at least one positive trait about self. Patient will verbalize benefit of having a healthy self-esteem.   Behavioral Response: Did not attend  Intervention: I Am   Activity: Patients were given a worksheet with the letter I on it and were instructed to write as many positive traits about themselves inside the letter.  Education: LRT educated patients on ways to increase their self-esteem.   Education Outcome: Patient did not attend group.  Clinical Observations/Feedback: Patient did not attend group.  Zetha Kuhar M, LRT/CTRS 01/07/2017 9:55 AM 

## 2017-01-07 NOTE — Progress Notes (Signed)
Patient ID: Benjamin HensenJoshua [name], male   DOB: 12/06/1980, 36 y.o.   MRN: [id number] Isolated to room, encouraged to take medication brought to him at bedside, assisted to sit him up, persuaded him but he declined; NPO past MN for ECT

## 2017-01-07 NOTE — Progress Notes (Signed)
Pt remained in bed all of shift other than going to ECT dept. Pt refused ECT. Not talking, will look at you when tactile stimuli. Refused medications. Encouraged pt to get out of bed for meals and meds, pt remained in bed eyes closed. Pt remains safe on unit with q 15 min checks.

## 2017-01-07 NOTE — Progress Notes (Signed)
[facility]  01/07/2017 4:49 PM [name]  MRN:  [id number] Subjective:  Patient not able to give any information. Patient was seen today with the treatment team including social work and nursing. Discussed with ECT treatment team. This 36 year old man continues to be nonverbal and non-interactive. Has eyes open much of the time but responds neither with gesture nor speech to anything. Eating only a small amount. No sign of any improvement so far with the Ativan he was given. We were unable to do ECT today because of a lack of consent Principal Problem: Major depressive disorder, recurrent severe without psychotic features (HCC) Diagnosis:   Patient Active Problem List   Diagnosis Date Noted  . Major depressive disorder, recurrent severe without psychotic features (HCC) [F33.2] 01/05/2017  . MDD (major depressive disorder), recurrent, with catatonic features (HCC) [F33.9, F06.1] 08/09/2016  . Severe recurrent major depression with psychotic features (HCC) [F33.3] 07/21/2016  . MDD (major depressive disorder) [F32.9] 07/21/2016  . PTSD (post-traumatic stress disorder) [F43.10] 07/15/2016  . OCD (obsessive compulsive disorder) [F42.9] 07/15/2016  . 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 Bradycardia [R00.1] 09/18/2012  . Decreased oral intake [R63.8] 09/17/2012  . Psychosis [F29] 09/09/2012  . Severe episode of recurrent major depressive disorder, with psychotic features (HCC) [F33.3] 09/08/2012  . History of substance abuse [Z87.898] 09/08/2012   Total Time spent with patient: 20 minutes  Past Psychiatric History: This is a 36 year old man with a history of recurrent episodes of depression and anxiety manifesting as catatonia.  Past Medical History:  Past Medical History:  Diagnosis Date  . Depression   . Herniated disc    x3  . Hypertension   . Mental disorder     Past Surgical History:  Procedure Laterality Date  .  SHOULDER SURGERY     Family History: History reviewed. No pertinent family history. Family Psychiatric  History: Nonidentified Social History:  History  Alcohol Use No    Comment: Pt is nonverbal. Unknown.      History  Drug Use No    Comment: Pt is nonverbal. Unknown.     Social History   Social History  . Marital status: Single    Spouse name: N/A  . Number of children: N/A  . Years of education: N/A   Social History Main Topics  . Smoking status: Former Smoker    Packs/day: 1.00    Types: Cigarettes    Quit date: 04/04/2012  . Smokeless tobacco: Never Used     Comment: Pt is nonverbal. Unknown.   . Alcohol use No     Comment: Pt is nonverbal. Unknown.   . Drug use: No     Comment: Pt is nonverbal. Unknown.   Marland Kitchen. Sexual activity: No   Other Topics Concern  . None   Social History Narrative  . None   Additional Social History:                         Sleep: Fair  Appetite:  Poor  Current Medications: Current Facility-Administered Medications  Medication Dose Route Frequency Provider Last Rate Last Dose  . acetaminophen (TYLENOL) tablet 650 mg  650 mg Oral Q6H PRN Charm RingsLord, Jamison Y, NP      . alum & mag hydroxide-simeth (MAALOX/MYLANTA) 200-200-20 MG/5ML suspension 30 mL  30 mL Oral Q4H PRN Charm RingsLord, Jamison Y, NP      . ARIPiprazole (ABILIFY) tablet 5 mg  5  mg Oral Daily Clapacs, John T, MD      . fluvoxaMINE (LUVOX) tablet 100 mg  100 mg Oral BID Clapacs, John T, MD      . LORazepam (ATIVAN) injection 2 mg  2 mg Intramuscular Q4H while awake Clapacs, John T, MD      . magnesium hydroxide (MILK OF MAGNESIA) suspension 30 mL  30 mL Oral Daily PRN Charm Rings, NP        Lab Results:  Results for orders placed or performed during the hospital encounter of 01/05/17 (from the past 48 hour(s))  Glucose, capillary     Status: Abnormal   Collection Time: 01/07/17  6:46 AM  Result Value Ref Range   Glucose-Capillary 114 (H) 65 - 99 mg/dL   Comment 1 Notify  RN     Blood Alcohol level:  Lab Results  Component Value Date   ETH <5 01/03/2017   ETH <5 01/01/2017    Metabolic Disorder Labs: Lab Results  Component Value Date   HGBA1C 5.2 07/15/2016   MPG 103 07/15/2016   No results found for: PROLACTIN Lab Results  Component Value Date   CHOL 187 07/15/2016   TRIG 86 07/15/2016   HDL 30 (L) 07/15/2016   CHOLHDL 6.2 07/15/2016   VLDL 17 07/15/2016   LDLCALC 140 (H) 07/15/2016    Physical Findings: AIMS: Facial and Oral Movements Muscles of Facial Expression: None, normal Lips and Perioral Area: None, normal Jaw: None, normal Tongue: None, normal,Extremity Movements Upper (arms, wrists, hands, fingers): None, normal Lower (legs, knees, ankles, toes): None, normal, Trunk Movements Neck, shoulders, hips: None, normal, Overall Severity Severity of abnormal movements (highest score from questions above): None, normal Incapacitation due to abnormal movements: None, normal Patient's awareness of abnormal movements (rate only patient's report): No Awareness, Dental Status Current problems with teeth and/or dentures?: No Does patient usually wear dentures?: No  CIWA:    COWS:     Musculoskeletal: Strength & Muscle Tone: within normal limits Gait & Station: unable to stand Patient leans: N/A  Psychiatric Specialty Exam: Physical Exam  Nursing note and vitals reviewed. Constitutional: He appears well-developed and well-nourished.  HENT:  Head: Normocephalic and atraumatic.  Eyes: Conjunctivae are normal. Pupils are equal, round, and reactive to light.  Neck: Normal range of motion.  Cardiovascular: Regular rhythm and normal heart sounds.   Respiratory: Effort normal. No respiratory distress.  GI: Soft.  Musculoskeletal: Normal range of motion.  Neurological: He is alert.  Skin: Skin is warm and dry.  Psychiatric: His affect is blunt. He is noncommunicative.    Review of Systems  Unable to perform ROS: Psychiatric disorder     Blood pressure 105/62, pulse (!) 59, temperature 98.2 F (36.8 C), temperature source Oral, resp. rate 16, height 5\' 10"  (1.778 m), weight 74.4 kg (164 lb), SpO2 99 %.Body mass index is 23.53 kg/m.  General Appearance: Casual  Eye Contact:  Minimal  Speech:  Negative  Volume:  Decreased  Mood:  Negative  Affect:  Negative  Thought Process:  Irrelevant  Orientation:  Negative  Thought Content:  Negative  Suicidal Thoughts:  Unknown  Homicidal Thoughts:  No  Memory:  Negative  Judgement:  Negative  Insight:  Negative  Psychomotor Activity:  Negative  Concentration:  Concentration: Negative  Recall:  Negative  Fund of Knowledge:  Negative  Language:  Negative  Akathisia:  Negative  Handed:  Right  AIMS (if indicated):     Assets:  Physical Health  ADL's:  Intact  Cognition:  Impaired,  Moderate  Sleep:  Number of Hours: 8.3     Treatment Plan Summary: Daily contact with patient to assess and evaluate symptoms and progress in treatment, Medication management and Plan Patient remains catatonic or selectively mute. Not interacting. We were not able to do ECT today because of lack of consent. I spent some time this afternoon speaking with his brother on the telephone. His brother is his only first-degree relative and the person who is closest with him. His brother is willing to give consent. ECT nursing staff spoke with him and have obtained consent for ECT. Meanwhile increased dose of standing Ativan. Continue monitoring vitals. Continue trying to engage him in treatment. Recheck labs.  [name], MD 01/07/2017, 4:49 PM

## 2017-01-07 NOTE — Tx Team (Signed)
Interdisciplinary Treatment and Diagnostic Plan Update  01/07/2017 Time of Session: [place] MRN: [id number]  Principal Diagnosis: Major depressive disorder, recurrent severe without psychotic features [facility])  Secondary Diagnoses: Principal Problem:   Major depressive disorder, recurrent severe without psychotic features ([place])   Current Medications:  Current Facility-Administered Medications  Medication Dose Route Frequency Provider Last Rate Last Dose  . acetaminophen (TYLENOL) tablet 650 mg  650 mg Oral Q6H PRN Patrecia Pour, NP      . alum & mag hydroxide-simeth (MAALOX/MYLANTA) 200-200-20 MG/5ML suspension 30 mL  30 mL Oral Q4H PRN Patrecia Pour, NP      . ARIPiprazole (ABILIFY) tablet 5 mg  5 mg Oral Daily Clapacs, John T, MD      . fluvoxaMINE (LUVOX) tablet 100 mg  100 mg Oral BID Clapacs, John T, MD      . LORazepam (ATIVAN) injection 1 mg  1 mg Intramuscular Q4H while awake Patrecia Pour, NP   1 mg at 01/06/17 1113  . magnesium hydroxide (MILK OF MAGNESIA) suspension 30 mL  30 mL Oral Daily PRN Patrecia Pour, NP       PTA Medications: Prescriptions Prior to Admission  Medication Sig Dispense Refill Last Dose  . ARIPiprazole (ABILIFY) 10 MG tablet Take 1 tablet (10 mg total) by mouth daily. 30 tablet 0 unknown at unknown  . fluvoxaMINE (LUVOX) 50 MG tablet Take 3 tablets (150 mg total) by mouth at bedtime. 90 tablet 0 unknown at unknown  . hydrOXYzine (ATARAX/VISTARIL) 25 MG tablet Take 1 tablet (25 mg total) by mouth every 6 (six) hours as needed for anxiety. 30 tablet 0 unknown at unknown  . traZODone (DESYREL) 100 MG tablet Take 1 tablet (100 mg total) by mouth at bedtime as needed for sleep. 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   Physician Treatment Plan for Primary Diagnosis: Major depressive disorder, recurrent severe without psychotic features ([place]) Long Term Goal(s): Improvement in symptoms so as ready for discharge Improvement in symptoms so as ready for discharge   Short Term Goals: Ability to demonstrate self-control will improve Compliance with prescribed medications will improve Ability to verbalize feelings will improve  Medication Management: Evaluate patient's response, side effects, and tolerance of medication regimen.  Therapeutic Interventions: 1 to 1 sessions, Unit Group sessions and Medication administration.  Evaluation of Outcomes: Not Met  Physician Treatment Plan for Secondary Diagnosis: Principal Problem:   Major depressive disorder, recurrent severe without psychotic features ([place])  Long Term Goal(s): Improvement in symptoms so as ready for discharge Improvement in symptoms so as ready for discharge   Short Term Goals: Ability to demonstrate self-control will improve Compliance with prescribed medications will improve Ability to verbalize feelings will improve     Medication Management: Evaluate patient's response, side effects, and tolerance of medication regimen.  Therapeutic Interventions: 1 to 1 sessions, Unit Group sessions and Medication administration.  Evaluation of Outcomes: Not Met   RN Treatment Plan for Primary Diagnosis: Major depressive disorder, recurrent severe without psychotic features ([place]) Long Term Goal(s): Knowledge of disease and therapeutic regimen to maintain health will improve  Short Term Goals: Ability to remain free from injury will improve, Ability to identify and develop effective coping  behaviors will improve and Compliance with prescribed medications will improve  Medication Management: RN will administer medications as  ordered by provider, will assess and evaluate patient's response and provide education to patient for prescribed medication. RN will report any adverse and/or side effects to prescribing provider.  Therapeutic Interventions: 1 on 1 counseling sessions, Psychoeducation, Medication administration, Evaluate responses to treatment, Monitor vital signs and CBGs as ordered, Perform/monitor CIWA, COWS, AIMS and Fall Risk screenings as ordered, Perform wound care treatments as ordered.  Evaluation of Outcomes: Not Met   LCSW Treatment Plan for Primary Diagnosis: Major depressive disorder, recurrent severe without psychotic features ([place]) Long Term Goal(s): Safe transition to appropriate next level of care at discharge, Engage patient in therapeutic group addressing interpersonal concerns.  Short Term Goals: Engage patient in aftercare planning with referrals and resources and Increase social support  Therapeutic Interventions: Assess for all discharge needs, 1 to 1 time with Social worker, Explore available resources and support systems, Assess for adequacy in community support network, Educate family and significant other(s) on suicide prevention, Complete Psychosocial Assessment, Interpersonal group therapy.  Evaluation of Outcomes: Not Met   Progress in Treatment: Attending groups: No. Participating in groups: No. Taking medication as prescribed: Yes. Toleration medication: Yes. Family/Significant other contact made: No, will contact:  when given permission Patient understands diagnosis: No Discussing patient identified problems/goals with staff: No. Medical problems stabilized or resolved: Yes. Denies suicidal/homicidal ideation: Yes. Issues/concerns per patient self-inventory: No. Other: none  New problem(s) identified: No, Describe:  none  New Short Term/Long Term Goal(s):Pt unable to verbalize any goals. Discharge Plan or Barriers: CSW assessing for appropriate plan.  Reason for  Continuation of Hospitalization: Other; describe ECT, catatonia  Estimated Length of Stay:7 days  Attendees: Patient:Benjamin [name] 01/07/2017 2:52 PM  Physician: Dr. Weber Cooks, MD 01/07/2017 2:52 PM  Nursing: Not available. 01/07/2017 2:52 PM  RN Care Manager: 01/07/2017 2:52 PM  Social Worker: Lurline Idol, LCSW 01/07/2017 2:52 PM  Recreational Therapist:  01/07/2017 2:52 PM  Other:  01/07/2017 2:52 PM  Other:  01/07/2017 2:52 PM  Other: 01/07/2017 2:52 PM    Scribe for Treatment Team: Joanne Chars, LCSW 01/07/2017 2:52 PM

## 2017-01-07 NOTE — Progress Notes (Signed)
Recreation Therapy Notes  At approximately 2:20 pm, LRT attempted assessment. Patient did not respond to LRT when LRT called patient's name.  Jacquelynn CreeGreene,Promiss Labarbera M, LRT/CTRS 01/07/2017 4:06 PM

## 2017-01-08 ENCOUNTER — Other Ambulatory Visit: Payer: Self-pay | Admitting: Psychiatry

## 2017-01-08 NOTE — [facility] Group Notes (Signed)
[facility] LCSW Group Therapy Note  Type of Therapy and Topic:  Group Therapy:  Goals Group: SMART Goals  Participation Level:  .Patient did not attend group. CSW invited patient to group.   Description of Group:   The purpose of a daily goals group is to assist and guide patients in setting recovery/wellness-related goals.  The objective is to set goals as they relate to the crisis in which they were admitted. Patients will be using SMART goal modalities to set measurable goals.  Characteristics of realistic goals will be discussed and patients will be assisted in setting and processing how one will reach their goal. Facilitator will also assist patients in applying interventions and coping skills learned in psycho-education groups to the SMART goal and process how one will achieve defined goal.  Therapeutic Goals: -Patients will develop and document one goal related to or their crisis in which brought them into treatment. -Patients will be guided by LCSW using SMART goal setting modality in how to set a measurable, attainable, realistic and time sensitive goal.  -Patients will process barriers in reaching goal. -Patients will process interventions in how to overcome and successful in reaching goal.   Summary of Patient Progress:  Patient Goal: Patient did not attend group. CSW invited patient to group.   Therapeutic Modalities:   Motivational Interviewing Engineer, manufacturing systemsCognitive Behavioral Therapy Crisis Intervention Model SMART goals setting  Gemini Bunte G. Garnette CzechSampson MSW, [facility] CenterCSWA 01/08/2017 10:47 AM

## 2017-01-08 NOTE — Plan of Care (Signed)
Problem: Activity: Goal: Sleeping patterns will improve Outcome: Progressing Patient slept for Estimated Hours of 7.15; q15 minutes safety round maintained, no injury or falls during this shift.    

## 2017-01-08 NOTE — Progress Notes (Signed)
Recreation Therapy Notes  Date: 06.07.18 Time: 9:30 am Location: Craft Room  Group Topic: Leisure Education  Goal Area(s) Addresses:  Patient will identify things they are grateful for. Patient will identify how being grateful can influence decision making.  Behavioral Response: Did not attend  Intervention: Grateful Wheel  Activity: Patients were given an I Am Grateful For worksheet and were instructed to write things they are grateful for under each category.   Education: LRT educated patients on leisure.  Education Outcome: Patient did not attend group.  Clinical Observations/Feedback: Patient did not attend group.  Fleur Audino M, LRT/CTRS 01/08/2017 10:05 AM 

## 2017-01-08 NOTE — Progress Notes (Signed)
Patient ID: Benjamin HensenJoshua [name], male   DOB: 1981/01/11, 36 y.o.   MRN: [id number] Patient remains isolated to room, alert, awake in bed, refused commands, agreed to Ativan 2 mg IM. Nursing staffs will continue to provide encouragement & support and maintain safety.

## 2017-01-08 NOTE — Progress Notes (Signed)
[facility] MD Progress Note  01/08/2017 4:27 PM Lamount Bankson  MRN:  [id number] Subjective:  Patient did not offer any information during the interaction today. This is a 36 year old man with a history of recurrent episodes of catatonia who was transferred to our hospital because of another catatonic episode in hopes that we could treated with ECT. He has not spoken a word to me since coming into the hospital. From what I can tell he has spoken very little or none to any of the staff. Sounds like he may be eating and drinking a little bit but mostly stays in bed with his eyes closed. That was where he was when we found him today. He responded to my speaking to him by opening his eyes slightly and moving in bed but did not make eye contact and did not speak. Patient does not appear to be in obvious physical distress but his lips are dry and I suspect he is getting more dehydrated. Principal Problem: Major depressive disorder, recurrent severe without psychotic features (HCC) Diagnosis:   Patient Active Problem List   Diagnosis Date Noted  . Major depressive disorder, recurrent severe without psychotic features (HCC) [F33.2] 01/05/2017  . MDD (major depressive disorder), recurrent, with catatonic features (HCC) [F33.9, F06.1] 08/09/2016  . Severe recurrent major depression with psychotic features (HCC) [F33.3] 07/21/2016  . MDD (major depressive disorder) [F32.9] 07/21/2016  . PTSD (post-traumatic stress disorder) [F43.10] 07/15/2016  . OCD (obsessive compulsive disorder) [F42.9] 07/15/2016  . 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 Bradycardia [R00.1] 09/18/2012  . Decreased oral intake [R63.8] 09/17/2012  . Psychosis [F29] 09/09/2012  . Severe episode of recurrent major depressive disorder, with psychotic features (HCC) [F33.3] 09/08/2012  . History of substance abuse [Z87.898] 09/08/2012   Total Time spent with patient: 30 minutes  Past Psychiatric  History: Patient has a history of recurrent episodes of this sort of thing. Underlying diagnosis is presumed to be depression and anxiety. Episodes appear to be at least partially related to major life stresses. Does not have a known history of a chronic psychotic illness. He's been hospitalized several times in the past mostly over the last couple years. Distant history of substance abuse which does not appear to be an active issue anymore  Past Medical History:  Past Medical History:  Diagnosis Date  . Depression   . Herniated disc    x3  . Hypertension   . Mental disorder     Past Surgical History:  Procedure Laterality Date  . SHOULDER SURGERY     Family History: History reviewed. No pertinent family history. Family Psychiatric  History: None is known of. Social History:  History  Alcohol Use No    Comment: Pt is nonverbal. Unknown.      History  Drug Use No    Comment: Pt is nonverbal. Unknown.     Social History   Social History  . Marital status: Single    Spouse name: N/A  . Number of children: N/A  . Years of education: N/A   Social History Main Topics  . Smoking status: Former Smoker    Packs/day: 1.00    Types: Cigarettes    Quit date: 04/04/2012  . Smokeless tobacco: Never Used     Comment: Pt is nonverbal. Unknown.   . Alcohol use No     Comment: Pt is nonverbal. Unknown.   . Drug use: No     Comment: Pt is nonverbal. Unknown.   Marland Kitchen  Sexual activity: No   Other Topics Concern  . None   Social History Narrative  . None   Additional Social History:                         Sleep: Negative  Appetite:  Negative  Current Medications: Current Facility-Administered Medications  Medication Dose Route Frequency Provider Last Rate Last Dose  . acetaminophen (TYLENOL) tablet 650 mg  650 mg Oral Q6H PRN Charm Rings, NP      . alum & mag hydroxide-simeth (MAALOX/MYLANTA) 200-200-20 MG/5ML suspension 30 mL  30 mL Oral Q4H PRN Charm Rings, NP       . ARIPiprazole (ABILIFY) tablet 5 mg  5 mg Oral Daily Clapacs, John T, MD      . fluvoxaMINE (LUVOX) tablet 100 mg  100 mg Oral BID Clapacs, John T, MD      . LORazepam (ATIVAN) injection 2 mg  2 mg Intramuscular Q4H while awake Clapacs, Jackquline Denmark, MD   2 mg at 01/08/17 1044  . magnesium hydroxide (MILK OF MAGNESIA) suspension 30 mL  30 mL Oral Daily PRN Charm Rings, NP        Lab Results:  Results for orders placed or performed during the hospital encounter of 01/05/17 (from the past 48 hour(s))  Glucose, capillary     Status: Abnormal   Collection Time: 01/07/17  6:46 AM  Result Value Ref Range   Glucose-Capillary 114 (H) 65 - 99 mg/dL   Comment 1 Notify RN     Blood Alcohol level:  Lab Results  Component Value Date   ETH <5 01/03/2017   ETH <5 01/01/2017    Metabolic Disorder Labs: Lab Results  Component Value Date   HGBA1C 5.2 07/15/2016   MPG 103 07/15/2016   No results found for: PROLACTIN Lab Results  Component Value Date   CHOL 187 07/15/2016   TRIG 86 07/15/2016   HDL 30 (L) 07/15/2016   CHOLHDL 6.2 07/15/2016   VLDL 17 07/15/2016   LDLCALC 140 (H) 07/15/2016    Physical Findings: AIMS: Facial and Oral Movements Muscles of Facial Expression: None, normal Lips and Perioral Area: None, normal Jaw: None, normal Tongue: None, normal,Extremity Movements Upper (arms, wrists, hands, fingers): None, normal Lower (legs, knees, ankles, toes): None, normal, Trunk Movements Neck, shoulders, hips: None, normal, Overall Severity Severity of abnormal movements (highest score from questions above): None, normal Incapacitation due to abnormal movements: None, normal Patient's awareness of abnormal movements (rate only patient's report): No Awareness, Dental Status Current problems with teeth and/or dentures?: No Does patient usually wear dentures?: No  CIWA:    COWS:     Musculoskeletal: Strength & Muscle Tone: within normal limits Gait & Station:  normal Patient leans: N/A  Psychiatric Specialty Exam: Physical Exam  Nursing note and vitals reviewed. Constitutional: He appears well-developed and well-nourished.  HENT:  Head: Normocephalic and atraumatic.  Eyes: Conjunctivae are normal. Pupils are equal, round, and reactive to light.  Neck: Normal range of motion.  Cardiovascular: Regular rhythm and normal heart sounds.   Respiratory: Effort normal. No respiratory distress.  GI: Soft.  Musculoskeletal: Normal range of motion.  Neurological: He is alert.  Skin: Skin is warm and dry.  Psychiatric: His affect is blunt. He is noncommunicative.    Review of Systems  Unable to perform ROS: Psychiatric disorder    Blood pressure 114/68, pulse 60, temperature 98.8 F (37.1 C), temperature source Oral, resp.  rate 18, height 5\' 10"  (1.778 m), weight 74.4 kg (164 lb), SpO2 99 %.Body mass index is 23.53 kg/m.  General Appearance: Casual  Eye Contact:  None  Speech:  Negative  Volume:  Normal  Mood:  Negative  Affect:  Blunt  Thought Process:  NA  Orientation:  Negative  Thought Content:  Negative  Suicidal Thoughts:  Unknown impossible to assess but he is not acting out in an aggressive way against himself  Homicidal Thoughts:  C comment for suicidal ideation above. He is not aggressive or acting out  Memory:  Negative  Judgement:  Negative  Insight:  Negative  Psychomotor Activity:  Negative  Concentration:  Concentration: Negative  Recall:  Negative  Fund of Knowledge:  Negative  Language:  Negative  Akathisia:  Negative  Handed:  Right  AIMS (if indicated):     Assets:  Housing Physical Health Social Support  ADL's:  Impaired  Cognition:  Impaired,  Moderate  Sleep:  Number of Hours: 7.15     Treatment Plan Summary: Daily contact with patient to assess and evaluate symptoms and progress in treatment, Medication management and Plan 36 year old man with catatonic episodes. Still not communicating with me. I had  tried increasing the dose of his lorazepam shots to improve response but so far this seems to of made no difference. ECT nursing team spoke to his brother yesterday and were able to get witnessed verbal consent for ECT treatment. Based on this we are scheduled to start ECT again tomorrow morning. Plan bilateral treatment. No change to current medicine except that I am going to cut off the lorazepam so that it does not interfere with the ECT. Patient was informed of all of this although he made no response  Mordecai RasmussenJohn Clapacs, MD 01/08/2017, 4:27 PM

## 2017-01-08 NOTE — Progress Notes (Signed)
Pt continues to refuse po medications. Will continue to monitor.

## 2017-01-08 NOTE — [facility] Group Notes (Signed)
  [facility] LCSW Group Therapy Note  Date/Time: 01/07/17, 1300 late entry note  Type of Therapy/Topic:  Group Therapy:  Emotion Regulation  Participation Level:  Did Not Attend   Mood:  Description of Group:    The purpose of this group is to assist patients in learning to regulate negative emotions and experience positive emotions. Patients will be guided to discuss ways in which they have been vulnerable to their negative emotions. These vulnerabilities will be juxtaposed with experiences of positive emotions or situations, and patients challenged to use positive emotions to combat negative ones. Special emphasis will be placed on coping with negative emotions in conflict situations, and patients will process healthy conflict resolution skills.  Therapeutic Goals: 1. Patient will identify two positive emotions or experiences to reflect on in order to balance out negative emotions:  2. Patient will label two or more emotions that they find the most difficult to experience:  3. Patient will be able to demonstrate positive conflict resolution skills through discussion or role plays:   Summary of Patient Progress:       Therapeutic Modalities:   Cognitive Behavioral Therapy Feelings Identification Dialectical Behavioral Therapy  Greg Travious Vanover, LCSW 

## 2017-01-08 NOTE — Progress Notes (Signed)
Pt taken to dining room to eat. Pt encouraged to eat. Pt refused breakfast and lunch.Will continue to monitor.

## 2017-01-08 NOTE — Progress Notes (Signed)
Pt seen in bed majority of day. He refused to eat dinner when encouraged by staff. He does not participate in treatment or unit activities. Pt does not speak or make good eye contact with staff. Will continue to monitor.

## 2017-01-08 NOTE — [facility] Group Notes (Signed)
[facility] LCSW Group Therapy  01/08/2017 2:08 PM  Type of Therapy:  Group Therapy  Participation Level:  Patient did not attend group. CSW invited patient to group.   Summary of Progress/Problems: Balance in life: Patients will discuss the concept of balance and how it looks and feels to be unbalanced. Pt will identify areas in their life that is unbalanced and ways to become more balanced. They discussed what aspects in their lives has influenced their self care. Patients also discussed self care in the areas of self regulation/control, hygiene/appearance, sleep/relaxation, healthy leisure, healthy eating habits, exercise, inner peace/spirituality, self improvement, sobriety, and health management. They were challenged to identify changes that are needed in order to improve self care.  Evelynne Spiers G. Garnette CzechSampson MSW, LCSWA 01/08/2017, 2:08 PM

## 2017-01-08 NOTE — Progress Notes (Signed)
Pt remains mute. Pt is withdrawn to room will encourage pt to eat meals and drink. Pt refused PO abilify and luvox.Pt has poor interaction and poor eye contact with staff.  Pt compliant with 1000 dose of IM injection. Will continue to monitor for safety.

## 2017-01-09 ENCOUNTER — Inpatient Hospital Stay: Payer: No Typology Code available for payment source | Admitting: Anesthesiology

## 2017-01-09 DIAGNOSIS — F1220 Cannabis dependence, uncomplicated: Secondary | ICD-10-CM | POA: Diagnosis present

## 2017-01-09 LAB — GLUCOSE, CAPILLARY
GLUCOSE-CAPILLARY: 143 mg/dL — AB (ref 65–99)
Glucose-Capillary: 80 mg/dL (ref 65–99)

## 2017-01-09 MED ORDER — HALOPERIDOL LACTATE 5 MG/ML IJ SOLN
INTRAMUSCULAR | Status: DC | PRN
  Administered 2017-01-09: 5 mg via INTRAVENOUS

## 2017-01-09 MED ORDER — LORAZEPAM 2 MG/ML IJ SOLN
INTRAMUSCULAR | Status: AC
  Filled 2017-01-09: qty 1

## 2017-01-09 MED ORDER — SUCCINYLCHOLINE CHLORIDE 20 MG/ML IJ SOLN
INTRAMUSCULAR | Status: DC | PRN
  Administered 2017-01-09: 100 mg via INTRAVENOUS

## 2017-01-09 MED ORDER — SODIUM CHLORIDE 0.9 % IV SOLN
INTRAVENOUS | Status: DC | PRN
  Administered 2017-01-09: 10:00:00 via INTRAVENOUS

## 2017-01-09 MED ORDER — METHOHEXITAL SODIUM 100 MG/10ML IV SOSY
PREFILLED_SYRINGE | INTRAVENOUS | Status: DC | PRN
  Administered 2017-01-09: 80 mg via INTRAVENOUS

## 2017-01-09 MED ORDER — SODIUM CHLORIDE 0.9 % IV SOLN
500.0000 mL | Freq: Once | INTRAVENOUS | Status: AC
  Administered 2017-01-09: 09:00:00 via INTRAVENOUS

## 2017-01-09 MED ORDER — MIDAZOLAM HCL 2 MG/2ML IJ SOLN
INTRAMUSCULAR | Status: AC
  Filled 2017-01-09: qty 2

## 2017-01-09 MED ORDER — LORAZEPAM 2 MG/ML IJ SOLN
INTRAMUSCULAR | Status: DC | PRN
  Administered 2017-01-09: 2 mg via INTRAVENOUS

## 2017-01-09 MED ORDER — MIDAZOLAM HCL 2 MG/2ML IJ SOLN
INTRAMUSCULAR | Status: DC | PRN
  Administered 2017-01-09: 2 mg via INTRAVENOUS

## 2017-01-09 MED ORDER — HALOPERIDOL LACTATE 5 MG/ML IJ SOLN
INTRAMUSCULAR | Status: AC
  Filled 2017-01-09: qty 1

## 2017-01-09 NOTE — H&P (Signed)
Benjamin HensenJoshua [name] is an 36 y.o. male.   Chief Complaint: Patient has no complaint. He is nonverbal HPI: History of catatonic spell going on now for probably a couple weeks  Past Medical History:  Diagnosis Date  . Depression   . Herniated disc    x3  . Hypertension   . Mental disorder     Past Surgical History:  Procedure Laterality Date  . SHOULDER SURGERY      History reviewed. No pertinent family history. Social History:  reports that he quit smoking about 4 years ago. His smoking use included Cigarettes. He smoked 1.00 pack per day. He has never used smokeless tobacco. He reports that he does not drink alcohol or use drugs.  Allergies: No Known Allergies  Medications Prior to Admission  Medication Sig Dispense Refill  . ARIPiprazole (ABILIFY) 10 MG tablet Take 1 tablet (10 mg total) by mouth daily. 30 tablet 0  . fluvoxaMINE (LUVOX) 50 MG tablet Take 3 tablets (150 mg total) by mouth at bedtime. 90 tablet 0  . hydrOXYzine (ATARAX/VISTARIL) 25 MG tablet Take 1 tablet (25 mg total) by mouth every 6 (six) hours as needed for anxiety. 30 tablet 0  . traZODone (DESYREL) 100 MG tablet Take 1 tablet (100 mg total) by mouth at bedtime as needed for sleep. 30 tablet 0    Results for orders placed or performed during the hospital encounter of 01/05/17 (from the past 48 hour(s))  Glucose, capillary     Status: None   Collection Time: 01/09/17  6:34 AM  Result Value Ref Range   Glucose-Capillary 80 65 - 99 mg/dL   Comment 1 Notify RN    No results found.  Review of Systems  Unable to perform ROS: Psychiatric disorder    Blood pressure 119/86, pulse (!) 57, temperature 98.4 F (36.9 C), temperature source Oral, resp. rate 20, height 5\' 10"  (1.778 m), weight 73.5 kg (162 lb), SpO2 95 %. Physical Exam  Nursing note and vitals reviewed. Constitutional: He appears well-developed and well-nourished.  HENT:  Head: Normocephalic and atraumatic.  Eyes: Conjunctivae are normal. Pupils  are equal, round, and reactive to light.  Neck: Normal range of motion.  Cardiovascular: Regular rhythm and normal heart sounds.   Respiratory: Effort normal. No respiratory distress.  GI: Soft.  Musculoskeletal: Normal range of motion.  Neurological: He is alert.  Skin: Skin is warm and dry.  Psychiatric: His affect is blunt. He is noncommunicative.     Assessment/Plan Patient continues to be catatonic no verbal communication. We are starting ECT today and hopes of breaking this catatonia and getting him to be on the past to getting better. 3 times a week bilateral treatment scheduled  Benjamin RasmussenJohn Cathryne Mancebo, MD 01/09/2017, 10:08 AM

## 2017-01-09 NOTE — Progress Notes (Signed)
Pt appeared to sleep most of the night though did get up a couple of times.  When approached by staff pt does not respond at all.  Monitored on 15 minute safety checks. ECT this am. Pt has been NPO after mid night

## 2017-01-09 NOTE — Progress Notes (Signed)
When patient got back from ECT patient was alert & oriented to person and place.Patient was pleasant,out in the milieu and interacting with staff.Compliant with medications.Denies suicidal or homicidal ideations and AV hallucinations.Appetite fair.At this time patient is not interacting at all & stayed in bed.Affect is sad & depressed.No eye contact.Support & encouragement given.

## 2017-01-09 NOTE — Anesthesia Postprocedure Evaluation (Signed)
Anesthesia Post Note  Patient: Benjamin [name]  Procedure(s) Performed: * No procedures listed *  Patient location during evaluation: Other Anesthesia Type: General Level of consciousness: combative, confused and awake and alert Pain management: pain level controlled Vital Signs Assessment: post-procedure vital signs reviewed and stable Respiratory status: spontaneous breathing and respiratory function stable Cardiovascular status: stable Anesthetic complications: no     Last Vitals:  Vitals:   01/09/17 0717 01/09/17 0850  BP: (!) 100/55 119/86  Pulse: (!) 53 (!) 57  Resp: 20 20  Temp: 36.6 C 36.9 C    Last Pain:  Vitals:   01/09/17 1124  TempSrc:   PainSc: 0-No pain                 KEPHART,WILLIAM K

## 2017-01-09 NOTE — Progress Notes (Signed)
Patient arrived on the floor in a wheelchair staring down at the ground. He was not responsive toward any verbal stimuli. He was encouraged to stand on the scale for weight and then return to the wheelchair. He was then assisted by 2 people to get in the bed. He was never aggressive or resistant just required tactile encouragement by moving his arms and legs to get from one point to another. Patient is now in bed staring downward and occasionally has his eyes closed. His railing are up for his safety.

## 2017-01-09 NOTE — Procedures (Signed)
ECT SERVICES Physician's Interval Evaluation & Treatment Note  Patient Identification: Benjamin [name] MRN:  [id number] Date of Evaluation:  01/09/2017 TX #: 1  MADRS: Essentially impossible to make a realistic reading because of his catatonic condition  MMSE: 0  P.E. Findings:  Patient does not appear to be in any physical distress. Vital stable. Heart and lung exam normal  Psychiatric Interval Note:  Patient is catatonic and has been for a couple weeks. Verbally unresponsive.  Subjective:  Patient is a 36 y.o. male seen for evaluation for Electroconvulsive Therapy. No statement from him  Treatment Summary:   []   Right Unilateral             [x]  Bilateral   % Energy : 1.0 ms 30%   Impedance: 1110 ohms  Seizure Energy Index: 26,275 V squared  Postictal Suppression Index: No reading although it looks reasonably good to my eye  Seizure Concordance Index: 97%  Medications  Pre Shock: 80 mg Brevital, 100 mg succinylcholine none  Post Shock: None  Seizure Duration: 50 seconds by EMG, 101 seconds by EEG. Computer read it as only 54 seconds but that was clearly not correct. Seizure went out to about 101 seconds   Comments: Next treatment Monday on the schedule Monday Wednesday Friday next week   Lungs:  [x]   Clear to auscultation               []  Other:   Heart:    [x]   Regular rhythm             []  irregular rhythm    [x]   Previous H&P reviewed, patient examined and there are NO CHANGES                 []   Previous H&P reviewed, patient examined and there are changes noted.   Mordecai RasmussenJohn Calum Cormier, MD 6/8/201810:28 AM

## 2017-01-09 NOTE — Plan of Care (Signed)
Problem: Pain Managment: Goal: General experience of comfort will improve Pt having catatonia and has been having a poor intake.

## 2017-01-09 NOTE — [facility] Group Notes (Signed)
[facility] Group Notes:  (Nursing/MHT/Case Management/Adjunct)  Date:  01/09/2017  Time:  12:33 AM  Type of Therapy:  Group Therapy  Participation Level:  Did Not Attend  Participation Quality:Summary of Progress/Problems:  Benjamin NeerJackie L Diera [name] 01/09/2017, 12:33 AM

## 2017-01-09 NOTE — Anesthesia Post-op Follow-up Note (Cosign Needed)
Anesthesia QCDR form completed.        

## 2017-01-09 NOTE — Progress Notes (Signed)
Patient is alert but not completely oriented. He does not know why he is at the hospital but he can state his name and that he is indeed in the hospital. He is sitting up on the stretcher with his legs crossed and appearing comfortable. Previously he had urinated on the stretcher and the urine was dark brown but clear and smelled concentrated. Dr. Toni Amendlapacs observed the appearance and odor of the urine. The staff cleaned him and placed on the patient a diaper and purple paper pants from the behavioral Medicine Unit. He was transferred to the Behavioral Medicine Unit where he was able to stand up with one person assist and sit in his bed. The Behavorial nurse was present when the patient was returned to his room.

## 2017-01-09 NOTE — Progress Notes (Signed)
D: Pt in his room all shift in bed.  Observed with change in position while sleeping.  Pt is to have ECT in the am.  NPO after mid night and pt was assessed to not have fluids in his room. Pt avoidant, withdrawn.  No verbalization upon approach.  A: Pt monitored on 15 minute safety checks and has maintained safety this shift.  Pt frequently approached to assess for needs.  R: Pt remains in bed isolating. No interaction.  NPO after midnight.

## 2017-01-09 NOTE — Anesthesia Preprocedure Evaluation (Addendum)
Anesthesia Evaluation  Patient identified by MRN, date of birth, ID band Patient unresponsive    Reviewed: Allergy & Precautions, NPO status , Patient's Chart, lab work & pertinent test results  Airway      Mouth opening: Limited Mouth Opening Comment: Unable to examine Dental   Pulmonary former smoker,           Cardiovascular hypertension,      Neuro/Psych Depression Schizophrenia    GI/Hepatic   Endo/Other    Renal/GU      Musculoskeletal   Abdominal   Peds  Hematology   Anesthesia Other Findings   Reproductive/Obstetrics                            Anesthesia Physical Anesthesia Plan  ASA: III  Anesthesia Plan: General   Post-op Pain Management:    Induction: Intravenous  PONV Risk Score and Plan: 2 and Ondansetron, Dexamethasone and Treatment may vary due to age  Airway Management Planned:   Additional Equipment:   Intra-op Plan:   Post-operative Plan:   Informed Consent: I have reviewed the patients History and Physical, chart, labs and discussed the procedure including the risks, benefits and alternatives for the proposed anesthesia with the patient or authorized representative who has indicated his/her understanding and acceptance.     Plan Discussed with:   Anesthesia Plan Comments:         Anesthesia Quick Evaluation

## 2017-01-09 NOTE — Progress Notes (Signed)
[facility]  01/09/2017 6:40 PM Benjamin HensenJoshua [name]  MRN:  [id number] Subjective:  Patient did not offer any information during the interaction today. This is a [age] year old man with a history of recurrent episodes of catatonia who was transferred to our hospital because of another catatonic episode in hopes that we could treated with ECT. He has not spoken a word to me since coming into the hospital. From what I can tell he has spoken very little or none to any of the staff. Sounds like he may be eating and drinking a little bit but mostly stays in bed with his eyes closed. That was where he was when we found him today. He responded to my speaking to him by opening his eyes slightly and moving in bed but did not make eye contact and did not speak. Patient does not appear to be in obvious physical distress but his lips are dry and I suspect he is getting more dehydrated.  Follow-up for 36 year old man with catatonia. He had bilateral ECT this morning which she tolerated fine. During the time immediately after the seizure he actually became awake and was able to speak and answer some questions. Subsequently however he went back to sleep and when I saw him this afternoon he is right back to his nonverbal nonresponsive catatonic behavior. Principal Problem: MDD (major depressive disorder), recurrent, with catatonic features (HCC) Diagnosis:   Patient Active Problem List   Diagnosis Date Noted  . MDD (major depressive disorder), recurrent, with catatonic features (HCC) [F33.9, F06.1] 08/09/2016  . Severe recurrent major depression with psychotic features (HCC) [F33.3] 07/21/2016  . MDD (major depressive disorder) [F32.9] 07/21/2016  . PTSD (post-traumatic stress disorder) [F43.10] 07/15/2016  . OCD (obsessive compulsive disorder) [F42.9] 07/15/2016  . 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 Bradycardia [R00.1] 09/18/2012  . Decreased oral intake [R63.8]  09/17/2012  . Psychosis [F29] 09/09/2012  . Severe episode of recurrent major depressive disorder, with psychotic features (HCC) [F33.3] 09/08/2012  . History of substance abuse [Z87.898] 09/08/2012   Total Time spent with patient: 30 minutes  Past Psychiatric History: Patient has a history of recurrent episodes of this sort of thing. Underlying diagnosis is presumed to be depression and anxiety. Episodes appear to be at least partially related to major life stresses. Does not have a known history of a chronic psychotic illness. He's been hospitalized several times in the past mostly over the last couple years. Distant history of substance abuse which does not appear to be an active issue anymore  Past Medical History:  Past Medical History:  Diagnosis Date  . Depression   . Herniated disc    x3  . Hypertension   . Mental disorder     Past Surgical History:  Procedure Laterality Date  . SHOULDER SURGERY     Family History: History reviewed. No pertinent family history. Family Psychiatric  History: None is known of. Social History:  History  Alcohol Use No    Comment: Pt is nonverbal. Unknown.      History  Drug Use No    Comment: Pt is nonverbal. Unknown.     Social History   Social History  . Marital status: Single    Spouse name: N/A  . Number of children: N/A  . Years of education: N/A   Social History Main Topics  . Smoking status: Former Smoker    Packs/day: 1.00    Types: Cigarettes    Quit  date: 04/04/2012  . Smokeless tobacco: Never Used     Comment: Pt is nonverbal. Unknown.   . Alcohol use No     Comment: Pt is nonverbal. Unknown.   . Drug use: No     Comment: Pt is nonverbal. Unknown.   Marland Kitchen Sexual activity: No   Other Topics Concern  . None   Social History Narrative  . None   Additional Social History:                         Sleep: Negative  Appetite:  Negative  Current Medications: Current Facility-Administered Medications   Medication Dose Route Frequency Provider Last Rate Last Dose  . acetaminophen (TYLENOL) tablet 650 mg  650 mg Oral Q6H PRN Charm Rings, NP      . alum & mag hydroxide-simeth (MAALOX/MYLANTA) 200-200-20 MG/5ML suspension 30 mL  30 mL Oral Q4H PRN Charm Rings, NP      . ARIPiprazole (ABILIFY) tablet 5 mg  5 mg Oral Daily Leaman Abe, Jackquline Denmark, MD   5 mg at 01/09/17 1129  . fluvoxaMINE (LUVOX) tablet 100 mg  100 mg Oral BID Raya Mckinstry, Jackquline Denmark, MD   100 mg at 01/09/17 1129  . magnesium hydroxide (MILK OF MAGNESIA) suspension 30 mL  30 mL Oral Daily PRN Charm Rings, NP        Lab Results:  Results for orders placed or performed during the hospital encounter of 01/05/17 (from the past 48 hour(s))  Glucose, capillary     Status: None   Collection Time: 01/09/17  6:34 AM  Result Value Ref Range   Glucose-Capillary 80 65 - 99 mg/dL   Comment 1 Notify RN   Glucose, capillary     Status: Abnormal   Collection Time: 01/09/17 11:57 AM  Result Value Ref Range   Glucose-Capillary 143 (H) 65 - 99 mg/dL   Comment 1 Notify RN     Blood Alcohol level:  Lab Results  Component Value Date   ETH <5 01/03/2017   ETH <5 01/01/2017    Metabolic Disorder Labs: Lab Results  Component Value Date   HGBA1C 5.2 07/15/2016   MPG 103 07/15/2016   No results found for: PROLACTIN Lab Results  Component Value Date   CHOL 187 07/15/2016   TRIG 86 07/15/2016   HDL 30 (L) 07/15/2016   CHOLHDL 6.2 07/15/2016   VLDL 17 07/15/2016   LDLCALC 140 (H) 07/15/2016    Physical Findings: AIMS: Facial and Oral Movements Muscles of Facial Expression: None, normal Lips and Perioral Area: None, normal Jaw: None, normal Tongue: None, normal,Extremity Movements Upper (arms, wrists, hands, fingers): None, normal Lower (legs, knees, ankles, toes): None, normal, Trunk Movements Neck, shoulders, hips: None, normal, Overall Severity Severity of abnormal movements (highest score from questions above): None,  normal Incapacitation due to abnormal movements: None, normal Patient's awareness of abnormal movements (rate only patient's report): No Awareness, Dental Status Current problems with teeth and/or dentures?: No Does patient usually wear dentures?: No  CIWA:    COWS:     Musculoskeletal: Strength & Muscle Tone: within normal limits Gait & Station: normal Patient leans: N/A  Psychiatric Specialty Exam: Physical Exam  Nursing note and vitals reviewed. Constitutional: He appears well-developed and well-nourished.  HENT:  Head: Normocephalic and atraumatic.  Eyes: Conjunctivae are normal. Pupils are equal, round, and reactive to light.  Neck: Normal range of motion.  Cardiovascular: Regular rhythm and normal heart sounds.  Respiratory: Effort normal. No respiratory distress.  GI: Soft.  Musculoskeletal: Normal range of motion.  Neurological: He is alert.  Skin: Skin is warm and dry.  Psychiatric: His affect is blunt. He is noncommunicative.    Review of Systems  Unable to perform ROS: Psychiatric disorder    Blood pressure 103/70, pulse 67, temperature 98.7 F (37.1 C), temperature source Oral, resp. rate 18, height 5\' 10"  (1.778 m), weight 73.5 kg (162 lb), SpO2 95 %.Body mass index is 23.24 kg/m.  General Appearance: Casual  Eye Contact:  None  Speech:  Negative  Volume:  Normal  Mood:  Negative  Affect:  Blunt  Thought Process:  NA  Orientation:  Negative  Thought Content:  Negative  Suicidal Thoughts:  Unknown impossible to assess but he is not acting out in an aggressive way against himself  Homicidal Thoughts:  C comment for suicidal ideation above. He is not aggressive or acting out  Memory:  Negative  Judgement:  Negative  Insight:  Negative  Psychomotor Activity:  Negative  Concentration:  Concentration: Negative  Recall:  Negative  Fund of Knowledge:  Negative  Language:  Negative  Akathisia:  Negative  Handed:  Right  AIMS (if indicated):     Assets:   Housing Physical Health Social Support  ADL's:  Impaired  Cognition:  Impaired,  Moderate  Sleep:  Number of Hours: 7     Treatment Plan Summary: Daily contact with patient to assess and evaluate symptoms and progress in treatment, Medication management and Plan He was not showing any response to the Ativan. I am optimistic about ECT helping but it looks like it's going to require several trials. Nursing is watching him to make sure he at least gets a little bit of oral intake. No change to medicine for now.  [name], MD 01/09/2017, 6:40 PM

## 2017-01-09 NOTE — Transfer of Care (Signed)
Immediate Anesthesia Transfer of Care Note  Patient: Benjamin HensenJoshua [name]  Procedure(s) Performed: ECT  Patient Location: Nursing Unit  Anesthesia Type:General  Level of Consciousness: awake, alert  and oriented  Airway & Oxygen Therapy: Patient Spontanous Breathing  Post-op Assessment: Post -op Vital signs reviewed and stable  Post vital signs: stable  Last Vitals:  Vitals:   01/09/17 0717 01/09/17 0850  BP: (!) 100/55 119/86  Pulse: (!) 53 (!) 57  Resp: 20 20  Temp: 36.6 C 36.9 C    Last Pain:  Vitals:   01/09/17 0850  TempSrc: Oral  PainSc:          Complications: No apparent anesthesia complications

## 2017-01-10 DIAGNOSIS — F061 Catatonic disorder due to known physiological condition: Secondary | ICD-10-CM

## 2017-01-10 DIAGNOSIS — F339 Major depressive disorder, recurrent, unspecified: Secondary | ICD-10-CM

## 2017-01-10 LAB — COMPREHENSIVE METABOLIC PANEL
ALBUMIN: 4 g/dL (ref 3.5–5.0)
ALK PHOS: 36 U/L — AB (ref 38–126)
ALT: 132 U/L — ABNORMAL HIGH (ref 17–63)
ANION GAP: 8 (ref 5–15)
AST: 62 U/L — AB (ref 15–41)
BILIRUBIN TOTAL: 0.9 mg/dL (ref 0.3–1.2)
BUN: 18 mg/dL (ref 6–20)
CALCIUM: 9.2 mg/dL (ref 8.9–10.3)
CO2: 27 mmol/L (ref 22–32)
Chloride: 102 mmol/L (ref 101–111)
Creatinine, Ser: 0.66 mg/dL (ref 0.61–1.24)
GFR calc Af Amer: >60 mL/min (ref >60)
GFR calc non Af Amer: >60 mL/min (ref >60)
GLUCOSE: 94 mg/dL (ref 65–99)
Potassium: 3.7 mmol/L (ref 3.5–5.1)
SODIUM: 137 mmol/L (ref 135–145)
TOTAL PROTEIN: 7.3 g/dL (ref 6.5–8.1)

## 2017-01-10 LAB — CBC
HEMATOCRIT: 43 % (ref 40.0–52.0)
HEMOGLOBIN: 15.1 g/dL (ref 13.0–18.0)
MCH: 30.1 pg (ref 26.0–34.0)
MCHC: 35.2 g/dL (ref 32.0–36.0)
MCV: 85.7 fL (ref 80.0–100.0)
Platelets: 198 10*3/uL (ref 150–440)
RBC: 5.02 MIL/uL (ref 4.40–5.90)
RDW: 12.7 % (ref 11.5–14.5)
WBC: 11.1 10*3/uL — AB (ref 3.8–10.6)

## 2017-01-10 NOTE — Progress Notes (Signed)
[facility]  01/10/2017 12:45 PM Benjamin HensenJoshua [name]  MRN:  [id number]  Subjective:   01/10/2017. Mr. Benjamin [name] is nonverbal today again. Moreover, he does not eat, drink or take medications. There is risk of dehydration. Will check labs. He did not respond to the usual treatment with Ativa. Per nursing report was somewhat better following ECT yestarday.  Per nursing: When patient got back from ECT patient was alert & oriented to person and place.Patient was pleasant,out in the milieu and interacting with staff.Compliant with medications.Denies suicidal or homicidal ideations and AV hallucinations.Appetite fair.At this time patient is not interacting at all & stayed in bed.Affect is sad & depressed.No eye contact.Support & encouragement given.  Principal Problem: MDD (major depressive disorder), recurrent, with catatonic features (HCC) Diagnosis:   Patient Active Problem List   Diagnosis Date Noted  . Cannabis use disorder, moderate, dependence (HCC) [F12.20] 01/09/2017  . MDD (major depressive disorder), recurrent, with catatonic features (HCC) [F33.9, F06.1] 08/09/2016  . Severe recurrent major depression with psychotic features (HCC) [F33.3] 07/21/2016  . MDD (major depressive disorder) [F32.9] 07/21/2016  . PTSD (post-traumatic stress disorder) [F43.10] 07/15/2016  . OCD (obsessive compulsive disorder) [F42.9] 07/15/2016  . 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 Bradycardia [R00.1] 09/18/2012  . Decreased oral intake [R63.8] 09/17/2012  . Psychosis [F29] 09/09/2012  . Severe episode of recurrent major depressive disorder, with psychotic features (HCC) [F33.3] 09/08/2012  . History of substance abuse [Z87.898] 09/08/2012   Total Time spent with patient: 30 minutes  Past Psychiatric History: depression, catatonia.  Past Medical History:  Past Medical History:  Diagnosis Date  . Depression   . Herniated disc    x3  . Hypertension    . Mental disorder     Past Surgical History:  Procedure Laterality Date  . SHOULDER SURGERY     Family History: History reviewed. No pertinent family history. Family Psychiatric  History: father committed suicida. Social History:  History  Alcohol Use No    Comment: Pt is nonverbal. Unknown.      History  Drug Use No    Comment: Pt is nonverbal. Unknown.     Social History   Social History  . Marital status: Single    Spouse name: N/A  . Number of children: N/A  . Years of education: N/A   Social History Main Topics  . Smoking status: Former Smoker    Packs/day: 1.00    Types: Cigarettes    Quit date: 04/04/2012  . Smokeless tobacco: Never Used     Comment: Pt is nonverbal. Unknown.   . Alcohol use No     Comment: Pt is nonverbal. Unknown.   . Drug use: No     Comment: Pt is nonverbal. Unknown.   Marland Kitchen. Sexual activity: No   Other Topics Concern  . None   Social History Narrative  . None   Additional Social History:                         Sleep: Fair  Appetite:  Poor  Current Medications: Current Facility-Administered Medications  Medication Dose Route Frequency Provider Last Rate Last Dose  . acetaminophen (TYLENOL) tablet 650 mg  650 mg Oral Q6H PRN Charm RingsLord, Jamison Y, NP      . alum & mag hydroxide-simeth (MAALOX/MYLANTA) 200-200-20 MG/5ML suspension 30 mL  30 mL Oral Q4H PRN Charm RingsLord, Jamison Y, NP      . ARIPiprazole (  ABILIFY) tablet 5 mg  5 mg Oral Daily Clapacs, Jackquline Denmark, MD   5 mg at 01/09/17 1129  . fluvoxaMINE (LUVOX) tablet 100 mg  100 mg Oral BID Clapacs, Jackquline Denmark, MD   100 mg at 01/09/17 1129  . magnesium hydroxide (MILK OF MAGNESIA) suspension 30 mL  30 mL Oral Daily PRN Charm Rings, NP        Lab Results:  Results for orders placed or performed during the hospital encounter of 01/05/17 (from the past 48 hour(s))  Glucose, capillary     Status: None   Collection Time: 01/09/17  6:34 AM  Result Value Ref Range   Glucose-Capillary 80 65 -  99 mg/dL   Comment 1 Notify RN   Glucose, capillary     Status: Abnormal   Collection Time: 01/09/17 11:57 AM  Result Value Ref Range   Glucose-Capillary 143 (H) 65 - 99 mg/dL   Comment 1 Notify RN     Blood Alcohol level:  Lab Results  Component Value Date   ETH <5 01/03/2017   ETH <5 01/01/2017    Metabolic Disorder Labs: Lab Results  Component Value Date   HGBA1C 5.2 07/15/2016   MPG 103 07/15/2016   No results found for: PROLACTIN Lab Results  Component Value Date   CHOL 187 07/15/2016   TRIG 86 07/15/2016   HDL 30 (L) 07/15/2016   CHOLHDL 6.2 07/15/2016   VLDL 17 07/15/2016   LDLCALC 140 (H) 07/15/2016    Physical Findings: AIMS: Facial and Oral Movements Muscles of Facial Expression: None, normal Lips and Perioral Area: None, normal Jaw: None, normal Tongue: None, normal,Extremity Movements Upper (arms, wrists, hands, fingers): None, normal Lower (legs, knees, ankles, toes): None, normal, Trunk Movements Neck, shoulders, hips: None, normal, Overall Severity Severity of abnormal movements (highest score from questions above): None, normal Incapacitation due to abnormal movements: None, normal Patient's awareness of abnormal movements (rate only patient's report): No Awareness, Dental Status Current problems with teeth and/or dentures?: No Does patient usually wear dentures?: No  CIWA:    COWS:     Musculoskeletal: Strength & Muscle Tone: abnormal Gait & Station: unable to stand Patient leans: N/A  Psychiatric Specialty Exam: Physical Exam  Nursing note and vitals reviewed. Psychiatric: His affect is blunt. He is withdrawn.    Review of Systems  Unable to perform ROS: Patient nonverbal    Blood pressure 103/70, pulse 67, temperature 98.7 F (37.1 C), temperature source Oral, resp. rate 18, height 5\' 10"  (1.778 m), weight 73.5 kg (162 lb), SpO2 95 %.Body mass index is 23.24 kg/m.  General Appearance: Disheveled  Eye Contact:  Minimal  Speech:   Blocked  Volume:  Decreased  Mood:  Depressed  Affect:  Flat  Thought Process:  NA  Orientation:  NA  Thought Content:  NA  Suicidal Thoughts:  unable to assess.  Homicidal Thoughts:  unable to assess.  Memory:  NA  Judgement:  NA  Insight:  NA  Psychomotor Activity:  Psychomotor Retardation  Concentration:  Concentration: NA and Attention Span: NA  Recall:  NA  Fund of Knowledge:  NA  Language:  NA  Akathisia:  Negative  Handed:  Right  AIMS (if indicated):     Assets:  Desire for Improvement  ADL's:  Impaired  Cognition:  Impaired,  Mild  Sleep:  Number of Hours: 8.5     Treatment Plan Summary: Daily contact with patient to assess and evaluate symptoms and progress in treatment and  Medication management   Mr. Godsey is a 36 year old male with a history of catatonic depression admitted for ECT.  1. Catatonia. The patient is unable to provide ROS. He is receiving ECT.  2. Mood and psychosis. We continued Abilify for depression.  3. Anxiety. We continued Luvox for OCD and PTSD.  4. Metabolic syndrome monitoring. Labs were done on 07/15/2017.  5. EKG. Sinus bradycardia, QTc 398.  6. Disposition. He will be discharged to home. Follow up with [facility].  Kristine Linea, MD 01/10/2017, 12:45 PM

## 2017-01-10 NOTE — [facility] Group Notes (Signed)
[facility] Group Notes:  (Nursing/MHT/Case Management/Adjunct)  Date:  01/10/2017  Time:  12:49 AM  Type of Therapy:  Evening Wrap-up Group  Participation Level:  Did Not Attend  Participation Quality:  N/A  Affect:  N/A  Cognitive:  N/A  Insight:  None  Engagement in Group:  Did Not Attend  Modes of Intervention:  Activity  Summary of Progress/Problems:  Benjamin MorrowChelsea Nanta Ekam Besson 01/10/2017, 12:49 AM

## 2017-01-10 NOTE — Progress Notes (Signed)
Patient has remained in bed this entire shift.  Will not respond.  Will open eyes when named is called but no other response.  Patient not taking medications or eating.  Safety checks maintained.

## 2017-01-10 NOTE — Progress Notes (Signed)
[facility] LCSW Group Therapy   01/10/2017  1 PM  Type of Therapy: Group Therapy   Participation Level: Did Not Attend. Patient invited to participate but declined.    Elon Lomeli F. Katherine Tout, MSW, LCSWA, LCAS     

## 2017-01-10 NOTE — Progress Notes (Signed)
Benjamin [name] spent most of shift resting in bed. He had no interaction with peers and staff. He remains flat and sad on approach. He appears to be in bed resting quietly.

## 2017-01-11 NOTE — Progress Notes (Signed)
Patient has remained in bed this entire shift.  Will not respond.  Will open eyes when named is called but no other response.  Patient not taking medications or eating.  Safety checks maintained.   

## 2017-01-11 NOTE — Progress Notes (Signed)
Patient has remained isolative to his room all shift. Patient refused morning medication and would not even acknowledge nurse was in room. Nurse attempted several times to talk with patient, but patient would not respond. Patient moved around in bed, but kept eyes clothes. Patient refuses to eat and drink, but fluids and food are encouraged. Patient did not display any disruptive behavior. Will continue to monitor patient and notify MD of any changes.

## 2017-01-11 NOTE — Progress Notes (Signed)
[facility] MD Progress Note  01/11/2017 7:16 AM Benjamin [name]  MRN:  [id number]  Subjective:   01/10/2017. Benjamin [name] is nonverbal today again. Moreover, he does not eat, drink or take medications. There is risk of dehydration. Will check labs. He did not respond to the usual treatment with [facility]. Per nursing report was somewhat better following ECT yestarday.  01/11/2017. Benjamin [name] remains nonverbal. He can be waken up and opens his eyes without looking at me. He does not indicate that he hears or understands the questions. He does not eat, drink or pee. Labs done yesterday remain normal.WBC 11.1.  Per nursing: Patient has remained in bed this entire shift. Will not respond. Will open eyes when named is called but no other response. Patient not taking medications or eating.Safety checks maintained.   Principal Problem: MDD (major depressive disorder), recurrent, with catatonic features ([place]) Diagnosis:   Patient Active Problem List   Diagnosis Date Noted  . Cannabis use disorder, moderate, dependence ([place]) [F12.20] 01/09/2017  . MDD (major depressive disorder), recurrent, with catatonic features ([place]) [F33.9, F06.1] 08/09/2016  . Severe recurrent major depression with psychotic features ([place]) [F33.3] 07/21/2016  . MDD (major depressive disorder) [F32.9] 07/21/2016  . PTSD (post-traumatic stress disorder) [F43.10] 07/15/2016  . OCD (obsessive compulsive disorder) [F42.9] 07/15/2016  . 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 Bradycardia [R00.1] 09/18/2012  . Decreased oral intake [R63.8] 09/17/2012  . Psychosis [F29] 09/09/2012  . Severe episode of recurrent major depressive disorder, with psychotic features ([place]) [F33.3] 09/08/2012  . History of substance abuse [Z87.898] 09/08/2012   Total Time spent with patient: 30 minutes  Past Psychiatric History: depression, catatonia.  Past Medical History:  Past Medical History:  Diagnosis Date  .  Depression   . Herniated disc    x3  . Hypertension   . Mental disorder     Past Surgical History:  Procedure Laterality Date  . SHOULDER SURGERY     Family History: History reviewed. No pertinent family history. Family Psychiatric  History: father committed suicida. Social History:  History  Alcohol Use No    Comment: Pt is nonverbal. Unknown.      History  Drug Use No    Comment: Pt is nonverbal. Unknown.     Social History   Social History  . Marital status: Single    Spouse name: N/A  . Number of children: N/A  . Years of education: N/A   Social History Main Topics  . Smoking status: Former Smoker    Packs/day: 1.00    Types: Cigarettes    Quit date: 04/04/2012  . Smokeless tobacco: Never Used     Comment: Pt is nonverbal. Unknown.   . Alcohol use No     Comment: Pt is nonverbal. Unknown.   . Drug use: No     Comment: Pt is nonverbal. Unknown.   Marland Kitchen Sexual activity: No   Other Topics Concern  . None   Social History Narrative  . None   Additional Social History:                         Sleep: Fair  Appetite:  Poor  Current Medications: Current Facility-Administered Medications  Medication Dose Route Frequency Provider Last Rate Last Dose  . acetaminophen (TYLENOL) tablet 650 mg  650 mg Oral Q6H PRN Patrecia Pour, NP      . alum & mag hydroxide-simeth (MAALOX/MYLANTA) 200-200-20 MG/5ML suspension  30 mL  30 mL Oral Q4H PRN Patrecia Pour, NP      . ARIPiprazole (ABILIFY) tablet 5 mg  5 mg Oral Daily Clapacs, Madie Reno, MD   5 mg at 01/09/17 1129  . fluvoxaMINE (LUVOX) tablet 100 mg  100 mg Oral BID Clapacs, Madie Reno, MD   100 mg at 01/09/17 1129  . magnesium hydroxide (MILK OF MAGNESIA) suspension 30 mL  30 mL Oral Daily PRN Patrecia Pour, NP        Lab Results:  Results for orders placed or performed during the hospital encounter of 01/05/17 (from the past 48 hour(s))  Glucose, capillary     Status: Abnormal   Collection Time: 01/09/17  11:57 AM  Result Value Ref Range   Glucose-Capillary 143 (H) 65 - 99 mg/dL   Comment 1 Notify RN   CBC     Status: Abnormal   Collection Time: 01/10/17  3:08 PM  Result Value Ref Range   WBC 11.1 (H) 3.8 - 10.6 K/uL   RBC 5.02 4.40 - 5.90 MIL/uL   Hemoglobin 15.1 13.0 - 18.0 g/dL   HCT 43.0 40.0 - 52.0 %   MCV 85.7 80.0 - 100.0 fL   MCH 30.1 26.0 - 34.0 pg   MCHC 35.2 32.0 - 36.0 g/dL   RDW 12.7 11.5 - 14.5 %   Platelets 198 150 - 440 K/uL  Comprehensive metabolic panel     Status: Abnormal   Collection Time: 01/10/17  3:08 PM  Result Value Ref Range   Sodium 137 135 - 145 mmol/L   Potassium 3.7 3.5 - 5.1 mmol/L   Chloride 102 101 - 111 mmol/L   CO2 27 22 - 32 mmol/L   Glucose, Bld 94 65 - 99 mg/dL   BUN 18 6 - 20 mg/dL   Creatinine, Ser 0.66 0.61 - 1.24 mg/dL   Calcium 9.2 8.9 - 10.3 mg/dL   Total Protein 7.3 6.5 - 8.1 g/dL   Albumin 4.0 3.5 - 5.0 g/dL   AST 62 (H) 15 - 41 U/L   ALT 132 (H) 17 - 63 U/L   Alkaline Phosphatase 36 (L) 38 - 126 U/L   Total Bilirubin 0.9 0.3 - 1.2 mg/dL   GFR calc non Af Amer >60 >60 mL/min   GFR calc Af Amer >60 >60 mL/min    Comment: (NOTE) The eGFR has been calculated using the CKD EPI equation. This calculation has not been validated in all clinical situations. eGFR's persistently <60 mL/min signify possible Chronic Kidney Disease.    Anion gap 8 5 - 15    Blood Alcohol level:  Lab Results  Component Value Date   ETH <5 01/03/2017   ETH <5 46/28/6381    Metabolic Disorder Labs: Lab Results  Component Value Date   HGBA1C 5.2 07/15/2016   MPG 103 07/15/2016   No results found for: PROLACTIN Lab Results  Component Value Date   CHOL 187 07/15/2016   TRIG 86 07/15/2016   HDL 30 (L) 07/15/2016   CHOLHDL 6.2 07/15/2016   VLDL 17 07/15/2016   LDLCALC 140 (H) 07/15/2016    Physical Findings: AIMS: Facial and Oral Movements Muscles of Facial Expression: None, normal Lips and Perioral Area: None, normal Jaw: None,  normal Tongue: None, normal,Extremity Movements Upper (arms, wrists, hands, fingers): None, normal Lower (legs, knees, ankles, toes): None, normal, Trunk Movements Neck, shoulders, hips: None, normal, Overall Severity Severity of abnormal movements (highest score from questions above): None, normal  Incapacitation due to abnormal movements: None, normal Patient's awareness of abnormal movements (rate only patient's report): No Awareness, Dental Status Current problems with teeth and/or dentures?: No Does patient usually wear dentures?: No  CIWA:    COWS:     Musculoskeletal: Strength & Muscle Tone: abnormal Gait & Station: unable to stand Patient leans: N/A  Psychiatric Specialty Exam: Physical Exam  Nursing note and vitals reviewed. Psychiatric: His affect is blunt. He is withdrawn.    Review of Systems  Unable to perform ROS: Patient nonverbal    Blood pressure 103/70, pulse 67, temperature 98.7 F (37.1 C), temperature source Oral, resp. rate 18, height 5' 10"  (1.778 m), weight 73.5 kg (162 lb), SpO2 95 %.Body mass index is 23.24 kg/m.  General Appearance: Disheveled  Eye Contact:  Minimal  Speech:  Blocked  Volume:  Decreased  Mood:  Depressed  Affect:  Flat  Thought Process:  NA  Orientation:  NA  Thought Content:  NA  Suicidal Thoughts:  unable to assess.  Homicidal Thoughts:  unable to assess.  Memory:  NA  Judgement:  NA  Insight:  NA  Psychomotor Activity:  Psychomotor Retardation  Concentration:  Concentration: NA and Attention Span: NA  Recall:  NA  Fund of Knowledge:  NA  Language:  NA  Akathisia:  Negative  Handed:  Right  AIMS (if indicated):     Assets:  Desire for Improvement  ADL's:  Impaired  Cognition:  Impaired,  Mild  Sleep:  Number of Hours: 8.5     Treatment Plan Summary: Daily contact with patient to assess and evaluate symptoms and progress in treatment and Medication management   Benjamin [name] is a 36 year old male with a history of  catatonic depression admitted for ECT.  1. Catatonia. The patient is unable to provide ROS. He is receiving ECT.  2. Mood and psychosis. We continued Abilify for depression.  3. Anxiety. We continued Luvox for OCD and PTSD.  4. Metabolic syndrome monitoring. Labs were done on 07/15/2017 normal except WBC 11.1.  5. EKG. Sinus bradycardia, QTc 398.  6. Disposition. He will be discharged to home. Follow up with [facility].  Orson Slick, MD 01/11/2017, 7:16 AM

## 2017-01-11 NOTE — [facility] Group Notes (Signed)
[facility] Group Notes:  (Nursing/MHT/Case Management/Adjunct)  Date:  01/11/2017  Time:  11:10 PM  Type of Therapy:  Psychoeducational Skills  Participation Level:  Did Not Attend  Evamarie Raetz R Maiyah Goyne 01/11/2017, 11:10 PM 

## 2017-01-11 NOTE — Plan of Care (Signed)
Problem: Safety: Goal: Ability to remain free from injury will improve Outcome: Progressing Patient has remain free from injury and is on Q15 minute checks.

## 2017-01-11 NOTE — Plan of Care (Signed)
Problem: Safety: Goal: Ability to remain free from injury will improve Outcome: Progressing Pt has not displayed any SIB while on the unit.   

## 2017-01-11 NOTE — [facility] Group Notes (Signed)
[facility] Group Notes: (Clinical Social Work)   01/11/2017      Type of Therapy:  Group Therapy   Participation Level:  Did Not Attend despite invitation   Ambrose MantleMareida Grossman-Orr, LCSW 01/11/2017, 2:41 PM

## 2017-01-12 ENCOUNTER — Inpatient Hospital Stay: Payer: No Typology Code available for payment source | Admitting: Anesthesiology

## 2017-01-12 ENCOUNTER — Other Ambulatory Visit: Payer: Self-pay | Admitting: Psychiatry

## 2017-01-12 ENCOUNTER — Encounter: Payer: Self-pay | Admitting: *Deleted

## 2017-01-12 LAB — GLUCOSE, CAPILLARY
GLUCOSE-CAPILLARY: 87 mg/dL (ref 65–99)
Glucose-Capillary: 77 mg/dL (ref 65–99)

## 2017-01-12 MED ORDER — HALOPERIDOL LACTATE 5 MG/ML IJ SOLN
INTRAMUSCULAR | Status: AC
  Filled 2017-01-12: qty 1

## 2017-01-12 MED ORDER — SUCCINYLCHOLINE CHLORIDE 20 MG/ML IJ SOLN
INTRAMUSCULAR | Status: DC | PRN
  Administered 2017-01-12: 100 mg via INTRAVENOUS

## 2017-01-12 MED ORDER — HALOPERIDOL LACTATE 5 MG/ML IJ SOLN
INTRAMUSCULAR | Status: DC | PRN
  Administered 2017-01-12: 5 mg via INTRAVENOUS

## 2017-01-12 MED ORDER — SODIUM CHLORIDE 0.9 % IV SOLN
500.0000 mL | Freq: Once | INTRAVENOUS | Status: AC
  Administered 2017-01-12: 500 mL via INTRAVENOUS

## 2017-01-12 MED ORDER — LORAZEPAM 2 MG/ML IJ SOLN
INTRAMUSCULAR | Status: AC
  Filled 2017-01-12: qty 1

## 2017-01-12 MED ORDER — MIDAZOLAM HCL 2 MG/2ML IJ SOLN
INTRAMUSCULAR | Status: AC
  Filled 2017-01-12: qty 2

## 2017-01-12 MED ORDER — METHOHEXITAL SODIUM 0.5 G IJ SOLR
INTRAMUSCULAR | Status: AC
  Filled 2017-01-12: qty 500

## 2017-01-12 MED ORDER — MIDAZOLAM HCL 2 MG/2ML IJ SOLN
INTRAMUSCULAR | Status: DC | PRN
  Administered 2017-01-12: 2 mg via INTRAVENOUS

## 2017-01-12 MED ORDER — METHOHEXITAL SODIUM 100 MG/10ML IV SOSY
PREFILLED_SYRINGE | INTRAVENOUS | Status: DC | PRN
  Administered 2017-01-12: 80 mg via INTRAVENOUS

## 2017-01-12 MED ORDER — SODIUM CHLORIDE 0.9 % IV SOLN
INTRAVENOUS | Status: DC | PRN
Start: 2017-01-12 — End: 2017-01-12
  Administered 2017-01-12: 12:00:00 via INTRAVENOUS

## 2017-01-12 MED ORDER — SUCCINYLCHOLINE CHLORIDE 20 MG/ML IJ SOLN
INTRAMUSCULAR | Status: AC
  Filled 2017-01-12: qty 1

## 2017-01-12 MED ORDER — MIDAZOLAM HCL 2 MG/2ML IJ SOLN: 2.0000 mg | Freq: Once | INTRAMUSCULAR | Status: DC

## 2017-01-12 NOTE — Progress Notes (Signed)
D: Pt denies SI/HI/AVH, affect is bright, and mood is pleasant and she is  cooperative with treatment care. Pt stated he feels better from doing ECT, he appears less anxious and he is interacting with peers and staff appropriately.  A: Pt was offered support and encouragement. Pt was given scheduled medications. Pt was encouraged to attend groups. Q 15 minute checks were done for safety.  R:Pt did not attend evening groups. Pt has no complaints.Pt receptive to treatment and safety maintained on unit.

## 2017-01-12 NOTE — Anesthesia Procedure Notes (Addendum)
Date/Time: 01/12/2017 11:48 AM Performed by: Marlana SalvageJESSUP, Shadasia Oldfield Pre-anesthesia Checklist: Patient identified, Emergency Drugs available, Suction available, Patient being monitored and Timeout performed Patient Re-evaluated:Patient Re-evaluated prior to inductionOxygen Delivery Method: Circle system utilized Preoxygenation: Pre-oxygenation with 100% oxygen Intubation Type: IV induction Ventilation: Two handed mask ventilation required, Mask ventilation throughout procedure and Oral airway inserted - appropriate to patient size Airway Equipment and Method: Bite block Placement Confirmation: positive ETCO2 Dental Injury: Teeth and Oropharynx as per pre-operative assessment

## 2017-01-12 NOTE — Tx Team (Signed)
Interdisciplinary Treatment and Diagnostic Plan Update  01/12/2017 Time of Session: 1450 Benjamin [name] MRN: [id number]  Principal Diagnosis: MDD (major depressive disorder), recurrent, with catatonic features ([facility])  Secondary Diagnoses: Principal Problem:   MDD (major depressive disorder), recurrent, with catatonic features ([facility]) Active Problems:   PTSD (post-traumatic stress disorder)   OCD (obsessive compulsive disorder)   Cannabis use disorder, moderate, dependence ([facility])   Current Medications:  Current Facility-Administered Medications  Medication Dose Route Frequency Provider Last Rate Last Dose  . acetaminophen (TYLENOL) tablet 650 mg  650 mg Oral Q6H PRN Benjamin Rings, NP      . alum & mag hydroxide-simeth (MAALOX/MYLANTA) 200-200-20 MG/5ML suspension 30 mL  30 mL Oral Q4H PRN Benjamin Rings, NP      . ARIPiprazole (ABILIFY) tablet 5 mg  5 mg Oral Daily Clapacs, Jackquline Denmark, MD   5 mg at 01/12/17 1308  . fluvoxaMINE (LUVOX) tablet 100 mg  100 mg Oral BID Clapacs, Jackquline Denmark, MD   100 mg at 01/12/17 1308  . magnesium hydroxide (MILK OF MAGNESIA) suspension 30 mL  30 mL Oral Daily PRN Benjamin Rings, NP      . midazolam (VERSED) injection 2 mg  2 mg Intravenous Once Clapacs, Jackquline Denmark, MD       PTA Medications: Prescriptions Prior to Admission  Medication Sig Dispense Refill Last Dose  . ARIPiprazole (ABILIFY) 10 MG tablet Take 1 tablet (10 mg total) by mouth daily. 30 tablet 0 unknown at unknown  . fluvoxaMINE (LUVOX) 50 MG tablet Take 3 tablets (150 mg total) by mouth at bedtime. 90 tablet 0 unknown at unknown  . hydrOXYzine (ATARAX/VISTARIL) 25 MG tablet Take 1 tablet (25 mg total) by mouth every 6 (six) hours as needed for anxiety. 30 tablet 0 unknown at unknown  . traZODone (DESYREL) 100 MG tablet Take 1 tablet (100 mg total) by mouth at bedtime as needed for sleep. 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   Physician Treatment Plan for Primary Diagnosis: MDD (major depressive disorder), recurrent, with catatonic features ([facility]) Long Term Goal(s): Improvement in symptoms so as ready for discharge Improvement in symptoms so as ready for discharge   Short Term Goals: Ability to demonstrate self-control will improve Compliance with prescribed medications will improve Ability to verbalize feelings will improve  Medication Management: Evaluate patient's response, side effects, and tolerance of medication regimen.  Therapeutic Interventions: 1 to 1 sessions, Unit Group sessions and Medication administration.  Evaluation of Outcomes: Progressing  Physician Treatment Plan for Secondary Diagnosis: Principal Problem:   MDD (major depressive disorder), recurrent, with catatonic features ([facility]) Active Problems:   PTSD (post-traumatic stress disorder)   OCD (obsessive compulsive disorder)   Cannabis use disorder, moderate, dependence ([facility])  Long Term Goal(s): Improvement in symptoms so as ready for discharge Improvement in symptoms so as ready for discharge   Short Term Goals: Ability to demonstrate self-control will improve Compliance with prescribed medications will improve Ability to verbalize feelings will improve     Medication Management: Evaluate patient's response, side effects, and tolerance of medication regimen.  Therapeutic Interventions: 1 to 1 sessions, Unit Group sessions and Medication administration.  Evaluation of Outcomes: Progressing   RN Treatment Plan for Primary  Diagnosis: MDD (major depressive disorder), recurrent, with catatonic features ([facility]) Long Term Goal(s): Knowledge of disease and  therapeutic regimen to maintain health will improve  Short Term Goals: Ability to remain free from injury will improve, Ability to identify and develop effective coping behaviors will improve and Compliance with prescribed medications will improve  Medication Management: RN will administer medications as ordered by provider, will assess and evaluate patient's response and provide education to patient for prescribed medication. RN will report any adverse and/or side effects to prescribing provider.  Therapeutic Interventions: 1 on 1 counseling sessions, Psychoeducation, Medication administration, Evaluate responses to treatment, Monitor vital signs and CBGs as ordered, Perform/monitor CIWA, COWS, AIMS and Fall Risk screenings as ordered, Perform wound care treatments as ordered.  Evaluation of Outcomes: Progressing   LCSW Treatment Plan for Primary Diagnosis: MDD (major depressive disorder), recurrent, with catatonic features (HCC) Long Term Goal(s): Safe transition to appropriate next level of care at discharge, Engage patient in therapeutic group addressing interpersonal concerns.  Short Term Goals: Engage patient in aftercare planning with referrals and resources and Increase social support  Therapeutic Interventions: Assess for all discharge needs, 1 to 1 time with Social worker, Explore available resources and support systems, Assess for adequacy in community support network, Educate family and significant other(s) on suicide prevention, Complete Psychosocial Assessment, Interpersonal group therapy.  Evaluation of Outcomes: Progressing   Progress in Treatment: Attending groups: No. Participating in groups: No. Taking medication as prescribed: Yes. Toleration medication: Yes. Family/Significant other contact made: No, will contact:  when given permission Patient understands diagnosis: No Discussing patient identified problems/goals with staff: No. Medical problems stabilized or  resolved: Yes. Denies suicidal/homicidal ideation: Yes. Issues/concerns per patient self-inventory: No. Other: none  New problem(s) identified: No, Describe:  none  New Short Term/Long Term Goal(s):Pt unable to verbalize any goals. Discharge Plan or Barriers: CSW assessing for appropriate plan.  Reason for Continuation of Hospitalization: Other; describe ECT, catatonia  Estimated Length of Stay:7 days  Attendees: Patient:Benjamin [name] 01/12/2017 3:29 PM  Physician: Dr. Toni Amendlapacs, MD 01/12/2017 3:29 PM  Nursing: Leonia ReaderPhyllis Cobb, RN 01/12/2017 3:29 PM  RN Care Manager: 01/12/2017 3:29 PM  Social Worker: Daleen SquibbGreg Reis Pienta, LCSW 01/12/2017 3:29 PM  Recreational Therapist:  01/12/2017 3:29 PM  Other:  01/12/2017 3:29 PM  Other:  01/12/2017 3:29 PM  Other: 01/12/2017 3:29 PM    Scribe for Treatment Team: Lorri FrederickWierda, Keishana K[place], LCSW 01/12/2017 3:29 PM

## 2017-01-12 NOTE — Plan of Care (Signed)
Problem: Health Behavior/Discharge Planning: Goal: Ability to manage health-related needs will improve Outcome: Not Progressing Pt catatonic not responding

## 2017-01-12 NOTE — Anesthesia Post-op Follow-up Note (Cosign Needed)
Anesthesia QCDR form completed.        

## 2017-01-12 NOTE — Anesthesia Preprocedure Evaluation (Signed)
Anesthesia Evaluation  Patient identified by MRN, date of birth, ID band Patient awake and Patient unresponsive    Reviewed: Allergy & Precautions, NPO status , Patient's Chart, lab work & pertinent test results  History of Anesthesia Complications Negative for: history of anesthetic complications  Airway Mallampati: III   Neck ROM: Full    Dental no notable dental hx.    Pulmonary neg sleep apnea, neg COPD, former smoker,    breath sounds clear to auscultation- rhonchi (-) wheezing      Cardiovascular hypertension, (-) CAD, (-) Past MI and (-) Cardiac Stents  Rhythm:Regular Rate:Normal - Systolic murmurs and - Diastolic murmurs    Neuro/Psych PSYCHIATRIC DISORDERS Depression Schizophrenia negative neurological ROS     GI/Hepatic negative GI ROS, Neg liver ROS,   Endo/Other  negative endocrine ROSneg diabetes  Renal/GU negative Renal ROS     Musculoskeletal negative musculoskeletal ROS (+)   Abdominal (+) - obese,   Peds  Hematology negative hematology ROS (+)   Anesthesia Other Findings Past Medical History: No date: Depression No date: Herniated disc     Comment: x3 No date: Hypertension No date: Mental disorder   Reproductive/Obstetrics                             Anesthesia Physical  Anesthesia Plan  ASA: III  Anesthesia Plan: General   Post-op Pain Management:    Induction: Intravenous  PONV Risk Score and Plan: 1 and Ondansetron  Airway Management Planned:   Additional Equipment:   Intra-op Plan:   Post-operative Plan:   Informed Consent: I have reviewed the patients History and Physical, chart, labs and discussed the procedure including the risks, benefits and alternatives for the proposed anesthesia with the patient or authorized representative who has indicated his/her understanding and acceptance.     Plan Discussed with: Anesthesiologist and  CRNA  Anesthesia Plan Comments:         Anesthesia Quick Evaluation

## 2017-01-12 NOTE — Progress Notes (Signed)
[facility] MD Progress Note  01/12/2017 8:57 PM Benjamin [name]  MRN:  [id number]  Subjective:   01/10/2017. Benjamin [name] is nonverbal today again. Moreover, he does not eat, drink or take medications. There is risk of dehydration. Will check labs. He did not respond to the usual treatment with [facility]. Per nursing report was somewhat better following ECT yestarday.  01/11/2017. Benjamin [name] remains nonverbal. He can be waken up and opens his eyes without looking at me. He does not indicate that he hears or understands the questions. He does not eat, drink or pee. Labs done yesterday remain normal.WBC 11.1.  Follow-up note for Monday the 11th. Patient had his second ECT treatment of this cycle today. When the patient was seen in the afternoon there was a dramatic improvement. He was awake and easily arousable and fully verbal. He had little or no memory of his hospitalization or the events that led him to this point. Affect was confused but euthymic. Not psychotic. Not suicidal. Cooperative with treatment. Eating and drinking more today now that he is awake.  Per nursing: Patient has remained in bed this entire shift. Will not respond. Will open eyes when named is called but no other response. Patient not taking medications or eating.Safety checks maintained.   Principal Problem: MDD (major depressive disorder), recurrent, with catatonic features (HCC) Diagnosis:   Patient Active Problem List   Diagnosis Date Noted  . Cannabis use disorder, moderate, dependence (HCC) [F12.20] 01/09/2017  . MDD (major depressive disorder), recurrent, with catatonic features (HCC) [F33.9, F06.1] 08/09/2016  . Severe recurrent major depression with psychotic features (HCC) [F33.3] 07/21/2016  . MDD (major depressive disorder) [F32.9] 07/21/2016  . PTSD (post-traumatic stress disorder) [F43.10] 07/15/2016  . OCD (obsessive compulsive disorder) [F42.9] 07/15/2016  . 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 Bradycardia [R00.1] 09/18/2012  . Decreased oral intake [R63.8] 09/17/2012  . Psychosis [F29] 09/09/2012  . Severe episode of recurrent major depressive disorder, with psychotic features (HCC) [F33.3] 09/08/2012  . History of substance abuse [Z87.898] 09/08/2012   Total Time spent with patient: 30 minutes  Past Psychiatric History: depression, catatonia.  Past Medical History:  Past Medical History:  Diagnosis Date  . Depression   . Herniated disc    x3  . Hypertension   . Mental disorder     Past Surgical History:  Procedure Laterality Date  . SHOULDER SURGERY     Family History: History reviewed. No pertinent family history. Family Psychiatric  History: father committed suicida. Social History:  History  Alcohol Use No    Comment: Pt is nonverbal. Unknown.      History  Drug Use No    Comment: Pt is nonverbal. Unknown.     Social History   Social History  . Marital status: Single    Spouse name: N/A  . Number of children: N/A  . Years of education: N/A   Social History Main Topics  . Smoking status: Former Smoker    Packs/day: 1.00    Types: Cigarettes    Quit date: 04/04/2012  . Smokeless tobacco: Never Used     Comment: Pt is nonverbal. Unknown.   . Alcohol use No     Comment: Pt is nonverbal. Unknown.   . Drug use: No     Comment: Pt is nonverbal. Unknown.   Marland Kitchen Sexual activity: No   Other Topics Concern  . None   Social History Narrative  . None   Additional  Social History:                         Sleep: Fair  Appetite:  Poor  Current Medications: Current Facility-Administered Medications  Medication Dose Route Frequency Provider Last Rate Last Dose  . acetaminophen (TYLENOL) tablet 650 mg  650 mg Oral Q6H PRN Charm Rings, NP      . alum & mag hydroxide-simeth (MAALOX/MYLANTA) 200-200-20 MG/5ML suspension 30 mL  30 mL Oral Q4H PRN Charm Rings, NP      . ARIPiprazole (ABILIFY) tablet 5 mg  5 mg  Oral Daily Keysi Oelkers, Jackquline Denmark, MD   5 mg at 01/12/17 1308  . fluvoxaMINE (LUVOX) tablet 100 mg  100 mg Oral BID Kolbee Bogusz, Jackquline Denmark, MD   100 mg at 01/12/17 1646  . magnesium hydroxide (MILK OF MAGNESIA) suspension 30 mL  30 mL Oral Daily PRN Charm Rings, NP      . midazolam (VERSED) injection 2 mg  2 mg Intravenous Once Monifa Blanchette, Jackquline Denmark, MD        Lab Results:  Results for orders placed or performed during the hospital encounter of 01/05/17 (from the past 48 hour(s))  Glucose, capillary     Status: None   Collection Time: 01/12/17  6:36 AM  Result Value Ref Range   Glucose-Capillary 77 65 - 99 mg/dL  Glucose, capillary     Status: None   Collection Time: 01/12/17  6:55 AM  Result Value Ref Range   Glucose-Capillary 87 65 - 99 mg/dL    Blood Alcohol level:  Lab Results  Component Value Date   ETH <5 01/03/2017   ETH <5 01/01/2017    Metabolic Disorder Labs: Lab Results  Component Value Date   HGBA1C 5.2 07/15/2016   MPG 103 07/15/2016   No results found for: PROLACTIN Lab Results  Component Value Date   CHOL 187 07/15/2016   TRIG 86 07/15/2016   HDL 30 (L) 07/15/2016   CHOLHDL 6.2 07/15/2016   VLDL 17 07/15/2016   LDLCALC 140 (H) 07/15/2016    Physical Findings: AIMS: Facial and Oral Movements Muscles of Facial Expression: None, normal Lips and Perioral Area: None, normal Jaw: None, normal Tongue: None, normal,Extremity Movements Upper (arms, wrists, hands, fingers): None, normal Lower (legs, knees, ankles, toes): None, normal, Trunk Movements Neck, shoulders, hips: None, normal, Overall Severity Severity of abnormal movements (highest score from questions above): None, normal Incapacitation due to abnormal movements: None, normal Patient's awareness of abnormal movements (rate only patient's report): No Awareness, Dental Status Current problems with teeth and/or dentures?: No Does patient usually wear dentures?: No  CIWA:    COWS:     Musculoskeletal: Strength  & Muscle Tone: abnormal Gait & Station: unable to stand Patient leans: N/A  Psychiatric Specialty Exam: Physical Exam  Nursing note and vitals reviewed. Psychiatric: He has a normal mood and affect. His speech is normal. Judgment normal. His affect is not blunt. He is slowed. He is not withdrawn. He expresses no homicidal and no suicidal ideation. He exhibits abnormal recent memory.    Review of Systems  Constitutional: Negative.   HENT: Negative.   Eyes: Negative.   Respiratory: Negative.   Cardiovascular: Negative.   Gastrointestinal: Negative.   Musculoskeletal: Negative.   Skin: Negative.   Neurological: Negative.   Psychiatric/Behavioral: Positive for memory loss. Negative for depression, hallucinations, substance abuse and suicidal ideas. The patient is nervous/anxious. The patient does not have insomnia.  Blood pressure 116/78, pulse 88, temperature 98.3 F (36.8 C), temperature source Oral, resp. rate 18, height 5\' 10"  (1.778 m), weight 72.1 kg (159 lb), SpO2 94 %.Body mass index is 22.81 kg/m.  General Appearance: Disheveled  Eye Contact:  Fair  Speech:  Clear and Coherent  Volume:  Decreased  Mood:  Depressed  Affect:  Flat  Thought Process:  Goal Directed  Orientation:  Full (Time, Place, and Person)  Thought Content:  NA  Suicidal Thoughts:  No  Homicidal Thoughts:  No  Memory:  NA  Judgement:  NA  Insight:  NA  Psychomotor Activity:  Psychomotor Retardation  Concentration:  Concentration: NA and Attention Span: NA  Recall:  NA  Fund of Knowledge:  NA  Language:  NA  Akathisia:  Negative  Handed:  Right  AIMS (if indicated):     Assets:  Desire for Improvement  ADL's:  Impaired  Cognition:  Impaired,  Mild  Sleep:  Number of Hours: 9.5     Treatment Plan Summary: Daily contact with patient to assess and evaluate symptoms and progress in treatment and Medication management   Mr. Benjamin [name] is a 36 year old male with a history of catatonic depression  admitted for ECT.  1. Catatonia. The patient is unable to provide ROS. He is receiving ECT.  2. Mood and psychosis. We continued Abilify for depression.  3. Anxiety. We continued Luvox for OCD and PTSD.  4. Metabolic syndrome monitoring. Labs were done on 07/15/2017 normal except WBC 11.1.  5. EKG. Sinus bradycardia, QTc 398.  6. Disposition. He will be discharged to home. [facility]  At least as of this afternoon the catatonia had broken and he was awake and interactive. We will see if this lasts. He is still on the schedule for ECT for Wednesday. For now continue current medicine. Patient was educated to some extent about all of the social problems that have transpired and encouraged to start making efforts to follow-up with that if he remains awake.  Mordecai RasmussenJohn Heily Carlucci, MD 01/12/2017, 8:57 PM

## 2017-01-12 NOTE — Anesthesia Postprocedure Evaluation (Signed)
Anesthesia Post Note  Patient: Benjamin HensenJoshua [name]  Procedure(s) Performed: * No procedures listed *  Patient location during evaluation: PACU Anesthesia Type: General Level of consciousness: awake and alert Pain management: pain level controlled Vital Signs Assessment: post-procedure vital signs reviewed and stable Respiratory status: spontaneous breathing, nonlabored ventilation and respiratory function stable Cardiovascular status: blood pressure returned to baseline and stable Postop Assessment: no signs of nausea or vomiting Anesthetic complications: no     Last Vitals:  Vitals:   01/12/17 1221 01/12/17 1231  BP: 124/73 115/67  Pulse: 63 (!) 57  Resp: 16 18  Temp:  36.3 C    Last Pain:  Vitals:   01/12/17 1231  TempSrc:   PainSc: 0-No pain                 Dewarren Ledbetter

## 2017-01-12 NOTE — Plan of Care (Signed)
Problem: Coping: Goal: Ability to verbalize frustrations and anger appropriately will improve Outcome: Progressing Patient verbalized to staff that his concern about his kids.

## 2017-01-12 NOTE — Transfer of Care (Signed)
Immediate Anesthesia Transfer of Care Note  Patient: Benjamin [name]  Procedure(s) Performed: * No procedures listed *  Patient Location: PACU  Anesthesia Type:General  Level of Consciousness: awake and patient cooperative  Airway & Oxygen Therapy: Patient Spontanous Breathing and Patient connected to face mask oxygen  Post-op Assessment: Report given to RN and Post -op Vital signs reviewed and stable  Post vital signs: Reviewed and stable  Last Vitals:  Vitals:   01/12/17 0939 01/12/17 1201  BP: 124/83 120/81  Pulse: (!) 57 65  Resp: 16 14  Temp: 36.3 C 36.2 C    Last Pain:  Vitals:   01/12/17 1201  TempSrc: Temporal  PainSc: 0-No pain         Complications: No apparent anesthesia complications

## 2017-01-12 NOTE — Progress Notes (Signed)
D: Pt in his room all shift.  Not drinking or eating.  Refuses to respond to questions asked.  Did ring his bell at one point but did not respond.  Pt encouraged at that time to take some fluids.  Pt provided with some Gatorade.  Pt apparently hid this as his fluids were removed for his NPO status.  Had not drank any of the fluids.  Pt appears distracted with a blank stare.  Pt NPO for ECT tomorrow. A: Pt monitored on 15 minute checks and maintained safety.  Does not respond to staff, doesn't drink fluids or eat.   R: Pt appearing catatonic, not responding to treatment or encouragement from staff. NPO for ECT.

## 2017-01-12 NOTE — Progress Notes (Addendum)
When patient came back from ECT patients affect is brighter.Patient states "I feel like I am out of it".Patient is alert & oriented x3.Patient does not remember how he got in the hospital or why he is here.Denies suicidal or homicidal ideations and AV hallucinations.Compliant with medications.Appetite poor.Isolated in rooms.Support & encouragement given.

## 2017-01-12 NOTE — H&P (Signed)
Benjamin [name] is an 36 y.o. male.   Chief Complaint: Patient not able to give any chief complaint HPI: History of recurrent severe catatonia unresponsive continues to not take anything by mouth nor to get out of bed or interact verbally  Past Medical History:  Diagnosis Date  . Depression   . Herniated disc    x3  . Hypertension   . Mental disorder     Past Surgical History:  Procedure Laterality Date  . SHOULDER SURGERY      History reviewed. No pertinent family history. Social History:  reports that he quit smoking about 4 years ago. His smoking use included Cigarettes. He smoked 1.00 pack per day. He has never used smokeless tobacco. He reports that he does not drink alcohol or use drugs.  Allergies: No Known Allergies  Medications Prior to Admission  Medication Sig Dispense Refill  . ARIPiprazole (ABILIFY) 10 MG tablet Take 1 tablet (10 mg total) by mouth daily. 30 tablet 0  . fluvoxaMINE (LUVOX) 50 MG tablet Take 3 tablets (150 mg total) by mouth at bedtime. 90 tablet 0  . hydrOXYzine (ATARAX/VISTARIL) 25 MG tablet Take 1 tablet (25 mg total) by mouth every 6 (six) hours as needed for anxiety. 30 tablet 0  . traZODone (DESYREL) 100 MG tablet Take 1 tablet (100 mg total) by mouth at bedtime as needed for sleep. 30 tablet 0    Results for orders placed or performed during the hospital encounter of 01/05/17 (from the past 48 hour(s))  CBC     Status: Abnormal   Collection Time: 01/10/17  3:08 PM  Result Value Ref Range   WBC 11.1 (H) 3.8 - 10.6 K/uL   RBC 5.02 4.40 - 5.90 MIL/uL   Hemoglobin 15.1 13.0 - 18.0 g/dL   HCT 43.0 40.0 - 52.0 %   MCV 85.7 80.0 - 100.0 fL   MCH 30.1 26.0 - 34.0 pg   MCHC 35.2 32.0 - 36.0 g/dL   RDW 12.7 11.5 - 14.5 %   Platelets 198 150 - 440 K/uL  Comprehensive metabolic panel     Status: Abnormal   Collection Time: 01/10/17  3:08 PM  Result Value Ref Range   Sodium 137 135 - 145 mmol/L   Potassium 3.7 3.5 - 5.1 mmol/L   Chloride 102 101  - 111 mmol/L   CO2 27 22 - 32 mmol/L   Glucose, Bld 94 65 - 99 mg/dL   BUN 18 6 - 20 mg/dL   Creatinine, Ser 0.66 0.61 - 1.24 mg/dL   Calcium 9.2 8.9 - 10.3 mg/dL   Total Protein 7.3 6.5 - 8.1 g/dL   Albumin 4.0 3.5 - 5.0 g/dL   AST 62 (H) 15 - 41 U/L   ALT 132 (H) 17 - 63 U/L   Alkaline Phosphatase 36 (L) 38 - 126 U/L   Total Bilirubin 0.9 0.3 - 1.2 mg/dL   GFR calc non Af Amer >60 >60 mL/min   GFR calc Af Amer >60 >60 mL/min    Comment: (NOTE) The eGFR has been calculated using the CKD EPI equation. This calculation has not been validated in all clinical situations. eGFR's persistently <60 mL/min signify possible Chronic Kidney Disease.    Anion gap 8 5 - 15  Glucose, capillary     Status: None   Collection Time: 01/12/17  6:36 AM  Result Value Ref Range   Glucose-Capillary 77 65 - 99 mg/dL  Glucose, capillary     Status: None  Collection Time: 01/12/17  6:55 AM  Result Value Ref Range   Glucose-Capillary 87 65 - 99 mg/dL   No results found.  Review of Systems  Unable to perform ROS: Psychiatric disorder    Blood pressure 124/83, pulse (!) 57, temperature 97.3 F (36.3 C), resp. rate 16, height 5' 10"  (1.778 m), weight 72.1 kg (159 lb), SpO2 97 %. Physical Exam  Nursing note and vitals reviewed. Constitutional: He appears well-developed and well-nourished.  HENT:  Head: Normocephalic and atraumatic.  Eyes: Conjunctivae are normal. Pupils are equal, round, and reactive to light.  Neck: Normal range of motion.  Cardiovascular: Regular rhythm and normal heart sounds.   Respiratory: Effort normal. No respiratory distress.  GI: Soft.  Musculoskeletal: Normal range of motion.  Neurological: He is alert.  Skin: Skin is warm and dry.  Psychiatric: His affect is blunt. He is slowed and withdrawn. He is noncommunicative.     Assessment/Plan Today is ECT treatment number to continue bilateral treatment along with medication and supportive therapy as needed on the  unit  Alethia Berthold, MD 01/12/2017, 11:42 AM

## 2017-01-12 NOTE — Procedures (Signed)
ECT SERVICES Physician's Interval Evaluation & Treatment Note  Patient Identification: Benjamin HensenJoshua [name] MRN:  [id number] Date of Evaluation:  01/12/2017 TX #: [id number]  MADRS:   MMSE:   P.E. Findings:  No change to physical exam although he continues to eat or drink  Psychiatric Interval Note:  Patient continues to be unresponsive verbally although his eyes seem a little more alert  Subjective:  Patient is a 36 y.o. male seen for evaluation for Electroconvulsive Therapy. No complaint offered  Treatment Summary:   []   Right Unilateral             [x]  Bilateral   % Energy : 1.0 ms 30%   Impedance: 1610 ohms  Seizure Energy Index: 16,209 V squared  Postictal Suppression Index: 10%  Seizure Concordance Index: 99%  Medications  Pre Shock: Brevital 80 mg succinylcholine 100 mg  Post Shock: Versed 2 mg haloperidol 5 mg  Seizure Duration: 50 seconds by EMG 90 seconds by EEG   Comments: We are going to continue the bilateral treatment until we break him out of this catatonia. Obviously can be discharged.   Lungs:  [x]   Clear to auscultation               []  Other:   Heart:    [x]   Regular rhythm             []  irregular rhythm    [x]   Previous H&P reviewed, patient examined and there are NO CHANGES                 []   Previous H&P reviewed, patient examined and there are changes noted.   Benjamin RasmussenJohn Clapacs, MD 6/11/201811:44 AM

## 2017-01-13 MED ORDER — ARIPIPRAZOLE 5 MG PO TABS
15.0000 mg | ORAL_TABLET | Freq: Every day | ORAL | Status: DC
  Administered 2017-01-14 – 2017-01-16 (×3): 15 mg via ORAL
  Filled 2017-01-13 (×3): qty 1

## 2017-01-13 NOTE — [facility] Group Notes (Signed)
[facility]  Date/Time: 01/13/17, 1pm  Type of Therapy/Topic:  Group Therapy:  Feelings about Diagnosis  Participation Level:  Did Not Attend   Glennon MacSara P Dylyn Mclaren 01/13/2017, 5:05 PM

## 2017-01-13 NOTE — Progress Notes (Signed)
[facility] MD Progress Note  01/13/2017 5:06 PM Keegan Ducey  MRN:  [id number]  Subjective:   01/10/2017. Mr. Yom is nonverbal today again. Moreover, he does not eat, drink or take medications. There is risk of dehydration. Will check labs. He did not respond to the usual treatment with [facility]. Per nursing report was somewhat better following ECT yestarday.  01/11/2017. Mr. Santana remains nonverbal. He can be waken up and opens his eyes without looking at me. He does not indicate that he hears or understands the questions. He does not eat, drink or pee. Labs done yesterday remain normal.WBC 11.1.  Follow-up note for Monday the 11th. Patient had his second ECT treatment of this cycle today. When the patient was seen in the afternoon there was a dramatic improvement. He was awake and easily arousable and fully verbal. He had little or no memory of his hospitalization or the events that led him to this point. Affect was confused but euthymic. Not psychotic. Not suicidal. Cooperative with treatment. Eating and drinking more today now that he is awake.  Follow-up note for Tuesday the 12th. Patient is back into his unresponsive condition today. Saw him this morning and while he briefly made eye contact and said 3 words to me he would not engage in any conversation or follow any further commands. We seemed to be having a little bit of gradual progress but it is slow and always prone to reversal. Read social work note today. Appreciate the attention to following up with his social condition.  Per nursing: Patient has remained in bed this entire shift. Will not respond. Will open eyes when named is called but no other response. Patient not taking medications or eating.Safety checks maintained.   Principal Problem: MDD (major depressive disorder), recurrent, with catatonic features (HCC) Diagnosis:   Patient Active Problem List   Diagnosis Date Noted  . Cannabis use disorder, moderate, dependence (HCC)  [F12.20] 01/09/2017  . MDD (major depressive disorder), recurrent, with catatonic features (HCC) [F33.9, F06.1] 08/09/2016  . Severe recurrent major depression with psychotic features (HCC) [F33.3] 07/21/2016  . MDD (major depressive disorder) [F32.9] 07/21/2016  . PTSD (post-traumatic stress disorder) [F43.10] 07/15/2016  . OCD (obsessive compulsive disorder) [F42.9] 07/15/2016  . 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 Bradycardia [R00.1] 09/18/2012  . Decreased oral intake [R63.8] 09/17/2012  . Psychosis [F29] 09/09/2012  . Severe episode of recurrent major depressive disorder, with psychotic features (HCC) [F33.3] 09/08/2012  . History of substance abuse [Z87.898] 09/08/2012   Total Time spent with patient: 30 minutes  Past Psychiatric History: depression, catatonia.  Past Medical History:  Past Medical History:  Diagnosis Date  . Depression   . Herniated disc    x3  . Hypertension   . Mental disorder     Past Surgical History:  Procedure Laterality Date  . SHOULDER SURGERY     Family History: History reviewed. No pertinent family history. Family Psychiatric  History: father committed suicida. Social History:  History  Alcohol Use No    Comment: Pt is nonverbal. Unknown.      History  Drug Use No    Comment: Pt is nonverbal. Unknown.     Social History   Social History  . Marital status: Single    Spouse name: N/A  . Number of children: N/A  . Years of education: N/A   Social History Main Topics  . Smoking status: Former Smoker    Packs/day: 1.00  Types: Cigarettes    Quit date: 04/04/2012  . Smokeless tobacco: Never Used     Comment: Pt is nonverbal. Unknown.   . Alcohol use No     Comment: Pt is nonverbal. Unknown.   . Drug use: No     Comment: Pt is nonverbal. Unknown.   Marland Kitchen Sexual activity: No   Other Topics Concern  . None   Social History Narrative  . None   Additional Social History:                          Sleep: Fair  Appetite:  Poor  Current Medications: Current Facility-Administered Medications  Medication Dose Route Frequency Provider Last Rate Last Dose  . acetaminophen (TYLENOL) tablet 650 mg  650 mg Oral Q6H PRN Charm Rings, NP      . alum & mag hydroxide-simeth (MAALOX/MYLANTA) 200-200-20 MG/5ML suspension 30 mL  30 mL Oral Q4H PRN Charm Rings, NP      . Melene Muller ON 01/14/2017] ARIPiprazole (ABILIFY) tablet 15 mg  15 mg Oral Daily Paris Chiriboga T, MD      . fluvoxaMINE (LUVOX) tablet 100 mg  100 mg Oral BID Charlesia Canaday, Jackquline Denmark, MD   100 mg at 01/12/17 1646  . magnesium hydroxide (MILK OF MAGNESIA) suspension 30 mL  30 mL Oral Daily PRN Charm Rings, NP      . midazolam (VERSED) injection 2 mg  2 mg Intravenous Once Rukia Mcgillivray, Jackquline Denmark, MD        Lab Results:  Results for orders placed or performed during the hospital encounter of 01/05/17 (from the past 48 hour(s))  Glucose, capillary     Status: None   Collection Time: 01/12/17  6:36 AM  Result Value Ref Range   Glucose-Capillary 77 65 - 99 mg/dL  Glucose, capillary     Status: None   Collection Time: 01/12/17  6:55 AM  Result Value Ref Range   Glucose-Capillary 87 65 - 99 mg/dL    Blood Alcohol level:  Lab Results  Component Value Date   ETH <5 01/03/2017   ETH <5 01/01/2017    Metabolic Disorder Labs: Lab Results  Component Value Date   HGBA1C 5.2 07/15/2016   MPG 103 07/15/2016   No results found for: PROLACTIN Lab Results  Component Value Date   CHOL 187 07/15/2016   TRIG 86 07/15/2016   HDL 30 (L) 07/15/2016   CHOLHDL 6.2 07/15/2016   VLDL 17 07/15/2016   LDLCALC 140 (H) 07/15/2016    Physical Findings: AIMS: Facial and Oral Movements Muscles of Facial Expression: None, normal Lips and Perioral Area: None, normal Jaw: None, normal Tongue: None, normal,Extremity Movements Upper (arms, wrists, hands, fingers): None, normal Lower (legs, knees, ankles, toes): None, normal,  Trunk Movements Neck, shoulders, hips: None, normal, Overall Severity Severity of abnormal movements (highest score from questions above): None, normal Incapacitation due to abnormal movements: None, normal Patient's awareness of abnormal movements (rate only patient's report): No Awareness, Dental Status Current problems with teeth and/or dentures?: No Does patient usually wear dentures?: No  CIWA:    COWS:     Musculoskeletal: Strength & Muscle Tone: abnormal Gait & Station: unable to stand Patient leans: N/A  Psychiatric Specialty Exam: Physical Exam  Nursing note and vitals reviewed. Psychiatric: His affect is blunt. He is slowed. He is not withdrawn. He expresses no homicidal and no suicidal ideation. He is noncommunicative. He exhibits abnormal recent memory.  Review of Systems  Unable to perform ROS: Patient unresponsive  Constitutional: Negative.   HENT: Negative.   Eyes: Negative.   Respiratory: Negative.   Cardiovascular: Negative.   Gastrointestinal: Negative.   Musculoskeletal: Negative.   Skin: Negative.   Neurological: Negative.   Psychiatric/Behavioral: Negative for depression, hallucinations, memory loss, substance abuse and suicidal ideas. The patient is not nervous/anxious and does not have insomnia.     Blood pressure 119/66, pulse (!) 55, temperature 98.2 F (36.8 C), temperature source Oral, resp. rate 18, height 5\' 10"  (1.778 m), weight 72.1 kg (159 lb), SpO2 97 %.Body mass index is 22.81 kg/m.  General Appearance: Disheveled  Eye Contact:  Fair  Speech:  Clear and Coherent  Volume:  Decreased  Mood:  Depressed  Affect:  Flat  Thought Process:  Goal Directed  Orientation:  Full (Time, Place, and Person)  Thought Content:  NA  Suicidal Thoughts:  No  Homicidal Thoughts:  No  Memory:  NA  Judgement:  NA  Insight:  NA  Psychomotor Activity:  Psychomotor Retardation  Concentration:  Concentration: NA and Attention Span: NA  Recall:  NA  Fund of  Knowledge:  NA  Language:  NA  Akathisia:  Negative  Handed:  Right  AIMS (if indicated):     Assets:  Desire for Improvement  ADL's:  Impaired  Cognition:  Impaired,  Mild  Sleep:  Number of Hours: 7     Treatment Plan Summary: Daily contact with patient to assess and evaluate symptoms and progress in treatment and Medication management   [name] is a 36 year old male with a history of catatonic depression admitted for ECT.  1. Catatonia. The patient is unable to provide ROS. He is receiving ECT.  2. Mood and psychosis. We continued Abilify for depression.  3. Anxiety. We continued Luvox for OCD and PTSD.  4. Metabolic syndrome monitoring. Labs were done on 07/15/2017 normal except WBC 11.1.  5. EKG. Sinus bradycardia, QTc 398.  6. Disposition. He will be discharged to home. [facility]  He is showing response to ECT but it is not lasting very long with each treatment. Nevertheless I think we are seeing progress. Plan is to continue ECT treatment next treatment tomorrow. I have also increased his antipsychotic dose up to 15 mg of Abilify. Urged patient to make sure he is still eating and drinking.  Mordecai RasmussenJohn Veria Stradley, MD 01/13/2017, 5:06 PM

## 2017-01-13 NOTE — Progress Notes (Signed)
Patient continues to be in a partial catatonic stage.Stays in bed,gets up and walk around in between.No verbal response and not taking anything by mouth including medicines.Support & encouragement given.Closely monitoring for safety.

## 2017-01-13 NOTE — [facility] Group Notes (Signed)
[facility] Group Notes:  (Nursing/MHT/Case Management/Adjunct)  Date:  01/13/2017  Time:  1:35 PM  Type of Therapy:  Psychoeducational Skills  Participation Level:  Did Not Attend  Unknown Schleyer Travis Robie Mcniel 01/13/2017, 1:35 PM 

## 2017-01-13 NOTE — Progress Notes (Signed)
Patient ID: Cherly HensenJoshua Kneisley, male   DOB: 1980/12/13, 36 y.o.   MRN: [id number] CSW spoke with Pt's brother Jacqualine Maundy Barnhardt, [phone].  Pt is catatonic and his brother is his next of kin.  Speaking with him to coordinate care as Pt is unable to give consent at this time.  Mardelle Mattendy states that his brother's children are fine, they are with their pre-school teacher, Lupita LeashDonna, whom they are fond of and that the Pt knows very well.  She is working with the children's Child psychotherapistsocial worker through DSS to coordinate interim care for them.  This arrangement was apparently consented to the last time he had a similar episode. He plans to have children enrolled in summer camps and visiting with him in [place] next month.  CSW updated brother on Pt's progress.  Shared that he was verbal yesterday and actually came to group very briefly although he verbalized being confused and unsure about why he was in the hospital.  CSW shared that today he is back to being non-verbal and minimally responsive.  Mardelle Mattendy shares that he has not contacted him or checked on children that he is aware of and that this is concerning to him.  CSW notified him taht at this time estimated discharge would be around next Friday but that this was not a firm deadline.  He asks to be notified with as much notice as possible before discharge or if we believe he will need to be referred for long term/higher level of care.   Jake SharkSara Shamarie Call, LCSW

## 2017-01-13 NOTE — Progress Notes (Signed)
Recreation Therapy Notes  Date: 06.12.18 Time: 9:30 am Location: Craft Room  Group Topic: Self-expression  Goal Area(s) Addresses:  Patient will identify one color per emotion listed on wheel. Patient will verbalize benefit of using art as a means of self-expression. Patient will verbalize one emotion experienced during session. Patient will be educated on other forms of self-expression.  Behavioral Response: Did not attend  Intervention: Emotion Wheel  Activity: Patients were given an Emotion Wheel worksheet and were instructed to pick a color for each emotion listed on the wheel.  Education: LRT educated patients on other forms of self-expression.  Education Outcome: Patient did not attend group.  Clinical Observations/Feedback: Patient did not attend group.  Jacquelynn CreeGreene,Jahnyla Parrillo M, LRT/CTRS 01/13/2017 10:22 AM

## 2017-01-14 ENCOUNTER — Encounter: Payer: Self-pay | Admitting: *Deleted

## 2017-01-14 ENCOUNTER — Inpatient Hospital Stay: Payer: No Typology Code available for payment source | Admitting: Anesthesiology

## 2017-01-14 ENCOUNTER — Other Ambulatory Visit: Payer: Self-pay | Admitting: Psychiatry

## 2017-01-14 LAB — GLUCOSE, CAPILLARY: GLUCOSE-CAPILLARY: 134 mg/dL — AB (ref 65–99)

## 2017-01-14 MED ORDER — MIDAZOLAM HCL 2 MG/2ML IJ SOLN
2.0000 mg | Freq: Once | INTRAMUSCULAR | Status: AC
  Administered 2017-01-14: 2 mg via INTRAVENOUS

## 2017-01-14 MED ORDER — SODIUM CHLORIDE 0.9 % IV SOLN
INTRAVENOUS | Status: DC | PRN
  Administered 2017-01-14: 11:00:00 via INTRAVENOUS

## 2017-01-14 MED ORDER — SUCCINYLCHOLINE CHLORIDE 20 MG/ML IJ SOLN
INTRAMUSCULAR | Status: AC
  Filled 2017-01-14: qty 1

## 2017-01-14 MED ORDER — SUCCINYLCHOLINE CHLORIDE 200 MG/10ML IV SOSY
PREFILLED_SYRINGE | INTRAVENOUS | Status: DC | PRN
  Administered 2017-01-14: 100 mg via INTRAVENOUS

## 2017-01-14 MED ORDER — MIDAZOLAM HCL 2 MG/2ML IJ SOLN
INTRAMUSCULAR | Status: AC
  Filled 2017-01-14: qty 2

## 2017-01-14 MED ORDER — HALOPERIDOL LACTATE 5 MG/ML IJ SOLN
INTRAMUSCULAR | Status: AC
  Filled 2017-01-14: qty 1

## 2017-01-14 MED ORDER — HALOPERIDOL LACTATE 5 MG/ML IJ SOLN
INTRAMUSCULAR | Status: DC | PRN
  Administered 2017-01-14: 5 mg via INTRAVENOUS

## 2017-01-14 MED ORDER — SODIUM CHLORIDE 0.9 % IV SOLN
500.0000 mL | Freq: Once | INTRAVENOUS | Status: AC
  Administered 2017-01-14: 1000 mL via INTRAVENOUS

## 2017-01-14 MED ORDER — METHOHEXITAL SODIUM 100 MG/10ML IV SOSY
PREFILLED_SYRINGE | INTRAVENOUS | Status: DC | PRN
  Administered 2017-01-14: 80 mg via INTRAVENOUS

## 2017-01-14 NOTE — [facility] Group Notes (Signed)
[facility] LCSW Group Therapy  01/14/2017 1:45 PM  Type of Therapy:  Group Therapy  Participation Level:  Patient did not attend group. CSW invited patient to group.   Summary of Progress/Problems: Emotional Regulation: Patients will identify both negative and positive emotions. They will discuss emotions they have difficulty regulating and how they impact their lives. Patients will be asked to identify healthy coping skills to combat unhealthy reactions to negative emotions.   Emir Nack G. Garnette CzechSampson MSW, LCSWA 01/14/2017, 1:46 PM

## 2017-01-14 NOTE — Progress Notes (Signed)
[facility] MD Progress Note  01/14/2017 7:49 PM Benjamin [name]  MRN:  [id number]  Subjective:   01/10/2017. Benjamin [name] is nonverbal today again. Moreover, he does not eat, drink or take medications. There is risk of dehydration. Will check labs. He did not respond to Benjamin usual treatment with [facility]. Per nursing report was somewhat better following ECT yestarday.  01/11/2017. Benjamin [name] remains nonverbal. He can be waken up and opens his eyes without looking at me. He does not indicate that he hears or understands Benjamin questions. He does not eat, drink or pee. Labs done yesterday remain normal.WBC 11.1.  Follow-up note for Monday Benjamin 11th. [name] had his second ECT treatment of this cycle today. When Benjamin [name] was seen in Benjamin afternoon there was a dramatic improvement. He was awake and easily arousable and fully verbal. He had little or no memory of his hospitalization or Benjamin events that led him to this point. Affect was confused but euthymic. Not psychotic. Not suicidal. Cooperative with treatment. Eating and drinking more today now that he is awake.  Follow-up note for Tuesday Benjamin 12th. [name] is back into his unresponsive condition today. Saw him this morning and while he briefly made eye contact and said 3 words to me he would not engage in any conversation or follow any further commands. We seemed to be having a little bit of gradual progress but it is slow and always prone to reversal. Read social work note today. Appreciate Benjamin attention to following up with his social condition.  Follow-up for Wednesday Benjamin 13th. As of this morning Benjamin [name] seems to have shown a lasting recovery from his catatonia. He was verbal and appropriate prior to treatment this morning and remains verbal and interactive in Benjamin afternoon. Mood is stated as being pretty good although a little anxious. He is cooperative and tolerating treatment. He denies that he's having any active psychosis. He is better able to discuss Benjamin  history of his recent condition today. [name] made some contact this morning with his children which was obviously very reassuring for him.  Per nursing: [name] has remained in bed this entire shift. Will not respond. Will open eyes when named is called but no other response. [name] not taking medications or eating.Safety checks maintained.   Principal Problem: MDD (major depressive disorder), recurrent, with catatonic features (HCC) Diagnosis:   [name] Active Problem List   Diagnosis Date Noted  . Cannabis use disorder, moderate, dependence (HCC) [F12.20] 01/09/2017  . MDD (major depressive disorder), recurrent, with catatonic features (HCC) [F33.9, F06.1] 08/09/2016  . Severe recurrent major depression with psychotic features (HCC) [F33.3] 07/21/2016  . MDD (major depressive disorder) [F32.9] 07/21/2016  . PTSD (post-traumatic stress disorder) [F43.10] 07/15/2016  . OCD (obsessive compulsive disorder) [F42.9] 07/15/2016  . 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 Bradycardia [R00.1] 09/18/2012  . Decreased oral intake [R63.8] 09/17/2012  . Psychosis [F29] 09/09/2012  . Severe episode of recurrent major depressive disorder, with psychotic features (HCC) [F33.3] 09/08/2012  . History of substance abuse [Z87.898] 09/08/2012   Total Time spent with [name]: 30 minutes  Past Psychiatric History: depression, catatonia.  Past Medical History:  Past Medical History:  Diagnosis Date  . Depression   . Herniated disc    x3  . Hypertension   . Mental disorder     Past Surgical History:  Procedure Laterality Date  . SHOULDER SURGERY     Family History: History reviewed. No pertinent family  history. Family Psychiatric  History: father committed suicida. Social History:  History  Alcohol Use No    Comment: Pt is nonverbal. Unknown.      History  Drug Use No    Comment: Pt is nonverbal. Unknown.     Social History   Social  History  . Marital status: Single    Spouse name: N/A  . Number of children: N/A  . Years of education: N/A   Social History Main Topics  . Smoking status: Former Smoker    Packs/day: 1.00    Types: Cigarettes    Quit date: 04/04/2012  . Smokeless tobacco: Never Used     Comment: Pt is nonverbal. Unknown.   . Alcohol use No     Comment: Pt is nonverbal. Unknown.   . Drug use: No     Comment: Pt is nonverbal. Unknown.   Marland Kitchen Sexual activity: No   Other Topics Concern  . None   Social History Narrative  . None   Additional Social History:                         Sleep: Fair  Appetite:  Poor  Current Medications: Current Facility-Administered Medications  Medication Dose Route Frequency Provider Last Rate Last Dose  . acetaminophen (TYLENOL) tablet 650 mg  650 mg Oral Q6H PRN Charm Rings, NP      . alum & mag hydroxide-simeth (MAALOX/MYLANTA) 200-200-20 MG/5ML suspension 30 mL  30 mL Oral Q4H PRN Charm Rings, NP      . ARIPiprazole (ABILIFY) tablet 15 mg  15 mg Oral Daily Jamal Pavon, Jackquline Denmark, MD   15 mg at 01/14/17 1331  . fluvoxaMINE (LUVOX) tablet 100 mg  100 mg Oral BID Lourie Retz, Jackquline Denmark, MD   100 mg at 01/14/17 1756  . magnesium hydroxide (MILK OF MAGNESIA) suspension 30 mL  30 mL Oral Daily PRN Charm Rings, NP      . midazolam (VERSED) injection 2 mg  2 mg Intravenous Once Brianni Manthe, Jackquline Denmark, MD        Lab Results:  Results for orders placed or performed during Benjamin hospital encounter of 01/05/17 (from Benjamin past 48 hour(s))  Glucose, capillary     Status: Abnormal   Collection Time: 01/14/17  8:06 AM  Result Value Ref Range   Glucose-Capillary 134 (H) 65 - 99 mg/dL    Blood Alcohol level:  Lab Results  Component Value Date   ETH <5 01/03/2017   ETH <5 01/01/2017    Metabolic Disorder Labs: Lab Results  Component Value Date   HGBA1C 5.2 07/15/2016   MPG 103 07/15/2016   No results found for: PROLACTIN Lab Results  Component Value Date   CHOL  187 07/15/2016   TRIG 86 07/15/2016   HDL 30 (L) 07/15/2016   CHOLHDL 6.2 07/15/2016   VLDL 17 07/15/2016   LDLCALC 140 (H) 07/15/2016    Physical Findings: AIMS: Facial and Oral Movements Muscles of Facial Expression: None, normal Lips and Perioral Area: None, normal Jaw: None, normal Tongue: None, normal,Extremity Movements Upper (arms, wrists, hands, fingers): None, normal Lower (legs, knees, ankles, toes): None, normal, Trunk Movements Neck, shoulders, hips: None, normal, Overall Severity Severity of abnormal movements (highest score from questions above): None, normal Incapacitation due to abnormal movements: None, normal [name]'s awareness of abnormal movements (rate only [name]'s report): No Awareness, Dental Status Current problems with teeth and/or dentures?: No Does [name] usually wear dentures?: No  CIWA:    COWS:     Musculoskeletal: Strength & Muscle Tone: abnormal Gait & Station: unable to stand [name] leans: N/A  Psychiatric Specialty Exam: Physical Exam  Nursing note and vitals reviewed. Psychiatric: He has a normal mood and affect. His speech is normal. His affect is not blunt. He is slowed. He is not withdrawn. He expresses no homicidal and no suicidal ideation. He exhibits abnormal recent memory.    Review of Systems  Unable to perform ROS: [name] unresponsive  Constitutional: Negative.   HENT: Negative.   Eyes: Negative.   Respiratory: Negative.   Cardiovascular: Negative.   Gastrointestinal: Negative.   Musculoskeletal: Negative.   Skin: Negative.   Neurological: Negative.   Psychiatric/Behavioral: Negative for depression, hallucinations, memory loss, substance abuse and suicidal ideas. Benjamin [name] is not nervous/anxious and does not have insomnia.     Blood pressure (!) 107/47, pulse 89, temperature 97.2 F (36.2 C), resp. rate 16, height 5\' 10"  (1.778 m), weight 71.2 kg (157 lb), SpO2 97 %.Body mass index is 22.53 kg/m.  General  Appearance: Disheveled  Eye Contact:  Fair  Speech:  Clear and Coherent  Volume:  Decreased  Mood:  Depressed  Affect:  Flat  Thought Process:  Goal Directed  Orientation:  Full (Time, Place, and Person)  Thought Content:  NA  Suicidal Thoughts:  No  Homicidal Thoughts:  No  Memory:  NA  Judgement:  NA  Insight:  NA  Psychomotor Activity:  Psychomotor Retardation  Concentration:  Concentration: NA and Attention Span: NA  Recall:  NA  Fund of Knowledge:  NA  Language:  NA  Akathisia:  Negative  Handed:  Right  AIMS (if indicated):     Assets:  Desire for Improvement  ADL's:  Impaired  Cognition:  Impaired,  Mild  Sleep:  Number of Hours: 7     Treatment Plan Summary: Daily contact with [name] to assess and evaluate symptoms and progress in treatment and Medication management   Benjamin [name] is a 36 year old male with a history of catatonic depression admitted for ECT.  1. Catatonia. Benjamin [name] is unable to provide ROS. He is receiving ECT.  2. Mood and psychosis. We continued Abilify for depression.  3. Anxiety. We continued Luvox for OCD and PTSD.  4. Metabolic syndrome monitoring. Labs were done on 07/15/2017 normal except WBC 11.1.  5. EKG. Sinus bradycardia, QTc 398.  6. Disposition. He will be discharged to home. [facility]  [name] has shown a very good response so far to treatment including ECT. Given his past history of rapid relapse I would like to continue observing him to make sure that he is going to stay well. He is still on Benjamin schedule for at least another ECT treatment on Friday. Tolerating medicine well. Continue Abilify and antidepressant medicine. Education completed with Benjamin [name] who is agreeable and cooperative with Benjamin plan.  Mordecai RasmussenJohn Sheddrick Lattanzio, MD 01/14/2017, 7:49 PM

## 2017-01-14 NOTE — Progress Notes (Signed)
Prior to ECT, pt reports he is "feeling much better."  Bright affect, smiling appropriately.  States, "I just wish I could have breakfast."  Pt aware he is NPO until after procedure.   Pt returned to unit from ECT, assisted to bed.  Pt calm, cooperative and is in no acute distress.  Reports ECT "went well," denies discomfort or other complaints.  VS obtained, BP 91/52, HR 55.  Pt denies light headedness, dizziness or any other symptoms.  Gatorade given, fluids encouraged.

## 2017-01-14 NOTE — Anesthesia Procedure Notes (Signed)
Date/Time: 01/14/2017 11:38 AM Performed by: Lily KocherPERALTA, Benjamin Haran Pre-anesthesia Checklist: Patient identified, Emergency Drugs available, Suction available and Patient being monitored Patient Re-evaluated:Patient Re-evaluated prior to inductionOxygen Delivery Method: Circle system utilized Preoxygenation: Pre-oxygenation with 100% oxygen Intubation Type: IV induction Ventilation: Mask ventilation without difficulty and Mask ventilation throughout procedure Airway Equipment and Method: Bite block Placement Confirmation: positive ETCO2 Dental Injury: Teeth and Oropharynx as per pre-operative assessment

## 2017-01-14 NOTE — Plan of Care (Signed)
Problem: Safety: Goal: Ability to remain free from injury will improve Outcome: Progressing Pt has not displayed any SIB while on the unit.   

## 2017-01-14 NOTE — Progress Notes (Signed)
D: Pt denies SI/HI/AVH, affect is flat, and mood is appropriate and he is  cooperative with treatment. Pt stated he feels better after doing ECT, he appears less anxious and he is interacting with peers and staff appropriately.  A: Pt was offered support and encouragement. Pt was given scheduled medications. Pt was encouraged to attend groups. Q 15 minute checks were done for safety.  R:Pt did not attend evening groups. Pt has no complaints.Pt receptive to treatment and safety maintained on unit.

## 2017-01-14 NOTE — Plan of Care (Signed)
Problem: Physical Regulation: Goal: Ability to maintain clinical measurements within normal limits will improve Outcome: Progressing BP low after returning from ECT.  Encouraged and provided fluids.  Pt complied, BP trending upwards.

## 2017-01-14 NOTE — H&P (Signed)
Benjamin HensenJoshua [name] is an 36 y.o. male.   Chief Complaint: Mood is better. Patient is awake alert. 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 Depression   . Herniated disc    x3  . Hypertension   . Mental disorder     Past Surgical History:  Procedure Laterality Date  . SHOULDER SURGERY      History reviewed. No pertinent family history. Social History:  reports that he quit smoking about 4 years ago. His smoking use included Cigarettes. He smoked 1.00 pack per day. He has never used smokeless tobacco. He reports that he does not drink alcohol or use drugs.  Allergies: No Known Allergies  Medications Prior to Admission  Medication Sig Dispense Refill  . ARIPiprazole (ABILIFY) 10 MG tablet Take 1 tablet (10 mg total) by mouth daily. 30 tablet 0  . fluvoxaMINE (LUVOX) 50 MG tablet Take 3 tablets (150 mg total) by mouth at bedtime. 90 tablet 0  . hydrOXYzine (ATARAX/VISTARIL) 25 MG tablet Take 1 tablet (25 mg total) by mouth every 6 (six) hours as needed for anxiety. 30 tablet 0  . traZODone (DESYREL) 100 MG tablet Take 1 tablet (100 mg total) by mouth at bedtime as needed for sleep. 30 tablet 0    Results for orders placed or performed during the hospital encounter of 01/05/17 (from the past 48 hour(s))  Glucose, capillary     Status: Abnormal   Collection Time: 01/14/17  8:06 AM  Result Value Ref Range   Glucose-Capillary 134 (H) 65 - 99 mg/dL   No results found.  Review of Systems  Constitutional: Negative.   HENT: Negative.   Eyes: Negative.   Respiratory: Negative.   Cardiovascular: Negative.   Gastrointestinal: Negative.   Musculoskeletal: Negative.   Skin: Negative.   Neurological: Negative.   Psychiatric/Behavioral: Positive for memory loss. Negative for depression, hallucinations, substance  abuse and suicidal ideas. The patient is nervous/anxious. The patient does not have insomnia.     Blood pressure 107/72, pulse (!) 57, temperature 97.7 F (36.5 C), temperature source Oral, resp. rate 18, height 5\' 10"  (1.778 m), weight 71.2 kg (157 lb), SpO2 98 %. Physical Exam  Nursing note and vitals reviewed. Constitutional: He appears well-developed and well-nourished.  HENT:  Head: Normocephalic and atraumatic.  Eyes: Conjunctivae are normal. Pupils are equal, round, and reactive to light.  Neck: Normal range of motion.  Cardiovascular: Regular rhythm and normal heart sounds.   Respiratory: Effort normal. No respiratory distress.  GI: Soft.  Musculoskeletal: Normal range of motion.  Neurological: He is alert.  Skin: Skin is warm and dry.  Psychiatric: He has a normal mood and affect. His speech is normal and behavior is normal. Judgment and thought content normal. He exhibits abnormal recent memory.     Assessment/Plan Treatment Friday as well with ongoing assessment of his stability before decisions about discharge planning  Mordecai RasmussenJohn Micahel Omlor, MD 01/14/2017, 11:29 AM

## 2017-01-14 NOTE — Progress Notes (Signed)
Recreation Therapy Notes  Date: 06.13.18 Time: 9:30 am Location: Craft Room  Group Topic: Self-esteem  Goal Area(s) Addresses:  Patient will write at least one positive trait about self. Patient will verbalize benefit of having a healthy self-esteem.  Behavioral Response: Did not attend  Intervention: I Am  Activity: Patients were given a worksheet with the letter I on it and were instructed to write as many positive traits inside the letter.  Education: LRT educated patients on ways to increase their self-esteem.  Education Outcome: Patient did not attend group.   Clinical Observations/Feedback: Patient did not attend group.  Jacquelynn CreeGreene,Maicee Ullman M, LRT/CTRS 01/14/2017 10:06 AM

## 2017-01-14 NOTE — Procedures (Signed)
ECT SERVICES Physician's Interval Evaluation & Treatment Note  Patient Identification: Benjamin [name] MRN:  [id number] Date of Evaluation:  01/14/2017 TX #: 3  MADRS:   MMSE:   P.E. Findings:  No change to physical exam except that now he is wide awake interactive alert communicative  Psychiatric Interval Note:  Dramatic improvement as of this morning. Communicative alert not psychotic mood stable  Subjective:  Patient is a 36 y.o. male seen for evaluation for Electroconvulsive Therapy. No specific complaints but feeling obviously much better  Treatment Summary:   []   Right Unilateral             [x]  Bilateral   % Energy : 1.0 ms 30%   Impedance: 940 ohms  Seizure Energy Index: 13,210 V squared  Postictal Suppression Index: No good reading was made  Seizure Concordance Index: 95%  Medications  Pre Shock: Brevital 80 mg succinylcholine 100 mg f  Post Shock: Versed 2 mg Haldol 5 mg  Seizure Duration: 52 seconds by EMG 63 seconds by EEG   Comments: Next treatment Friday continue current medication and stabilization on the unit with involvement in appropriate therapy and evaluation   Lungs:  [x]   Clear to auscultation               []  Other:   Heart:    [x]   Regular rhythm             []  irregular rhythm    [x]   Previous H&P reviewed, patient examined and there are NO CHANGES                 []   Previous H&P reviewed, patient examined and there are changes noted.   Mordecai RasmussenJohn Clapacs, MD 6/13/201811:31 AM

## 2017-01-14 NOTE — Anesthesia Postprocedure Evaluation (Signed)
Anesthesia Post Note  Patient: Cherly HensenJoshua Sturkey  Procedure(s) Performed: * No procedures listed *  Patient location during evaluation: PACU Anesthesia Type: General Level of consciousness: awake Pain management: pain level controlled Vital Signs Assessment: post-procedure vital signs reviewed and stable Respiratory status: spontaneous breathing Cardiovascular status: stable Anesthetic complications: no     Last Vitals:  Vitals:   01/14/17 1229 01/14/17 1230  BP:  102/69  Pulse: (!) 56 (!) 55  Resp:  20  Temp:  36.2 C    Last Pain:  Vitals:   01/14/17 1150  TempSrc:   PainSc: Asleep                 VAN STAVEREN,Ephram Kornegay

## 2017-01-14 NOTE — Anesthesia Preprocedure Evaluation (Signed)
Anesthesia Evaluation  Patient identified by MRN, date of birth, ID band Patient awake    Reviewed: Allergy & Precautions, NPO status , Patient's Chart, lab work & pertinent test results  Airway Mallampati: II       Dental  (+) Teeth Intact   Pulmonary neg pulmonary ROS, former smoker,    breath sounds clear to auscultation       Cardiovascular hypertension,  Rhythm:Regular     Neuro/Psych    GI/Hepatic negative GI ROS, Neg liver ROS,   Endo/Other  negative endocrine ROS  Renal/GU negative Renal ROS     Musculoskeletal negative musculoskeletal ROS (+)   Abdominal Normal abdominal exam  (+)   Peds negative pediatric ROS (+)  Hematology negative hematology ROS (+)   Anesthesia Other Findings   Reproductive/Obstetrics negative OB ROS                             Anesthesia Physical Anesthesia Plan  ASA: II  Anesthesia Plan: General   Post-op Pain Management:    Induction: Intravenous  PONV Risk Score and Plan: 0  Airway Management Planned: Mask  Additional Equipment:   Intra-op Plan:   Post-operative Plan:   Informed Consent: I have reviewed the patients History and Physical, chart, labs and discussed the procedure including the risks, benefits and alternatives for the proposed anesthesia with the patient or authorized representative who has indicated his/her understanding and acceptance.     Plan Discussed with: CRNA  Anesthesia Plan Comments:         Anesthesia Quick Evaluation

## 2017-01-14 NOTE — Transfer of Care (Signed)
Immediate Anesthesia Transfer of Care Note  Patient: Benjamin HensenJoshua Waltner  Procedure(s) Performed: ECT  Patient Location: PACU  Anesthesia Type:General  Level of Consciousness: sedated  Airway & Oxygen Therapy: Patient Spontanous Breathing  Post-op Assessment: Report given to RN and Post -op Vital signs reviewed and stable  Post vital signs: Reviewed and stable  Last Vitals:  Vitals:   01/14/17 0917 01/14/17 1150  BP: 107/72 (P) 106/71  Pulse: (!) 57 (P) 60  Resp: 18 (P) 14  Temp: 36.5 C 36.2 C    Last Pain:  Vitals:   01/14/17 0917  TempSrc: Oral  PainSc:          Complications: No apparent anesthesia complications

## 2017-01-14 NOTE — Anesthesia Post-op Follow-up Note (Cosign Needed)
Anesthesia QCDR form completed.        

## 2017-01-15 ENCOUNTER — Other Ambulatory Visit: Payer: Self-pay | Admitting: Psychiatry

## 2017-01-15 NOTE — Plan of Care (Signed)
Problem: Coping: Goal: Ability to verbalize frustrations and anger appropriately will improve Outcome: Progressing Working on coping skills    

## 2017-01-15 NOTE — Progress Notes (Signed)
CH responded to a consult stating Pt had suicidal thoughts and requested chaplains visit.. CH visited pt but pt was in groups at the time o this visit. CH talked to nurses in the nurse station and informed them he would visit Pt later when he is out of groups.    01/15/17 1000  Clinical Encounter Type  Visited With Patient;Health care provider  Visit Type Initial;Psychological support;Social support;Behavioral Health  Referral From Nurse  Consult/Referral To Chaplain  Spiritual Encounters  Spiritual Needs Emotional;Other (Comment)

## 2017-01-15 NOTE — Progress Notes (Signed)
Patient ID: Benjamin HensenJoshua Shock, male   DOB: Jul 05, 1981, 36 y.o.   MRN: [id number]  CSW meet with patient to discuss discharge plan. Patient is now verbally speaking and stating he is feeling a lot better. [facility] [place]. Patient also consented for staff to speak with his brother, Jacqualine Maundy Pinheiro. Patient states he plans to return to his apartment in [place] at Discharge.   CSW called [facility] to schedule follow-up appointment for patient. CSW left message for staff to return call to schedule follow-up.   Averee Harb G. Garnette CzechSampson MSW, [facility]WA 01/15/2017 10:36 AM

## 2017-01-15 NOTE — Progress Notes (Signed)
D: Voice of working on [employer] skills . Affect cheerful Interacting  With peers and staff. Patient stated slept good last night .Stated appetite is good and energy level  Is normal. Stated concentration is good . Stated on Depression scale 1 , hopeless 1  and anxiety 1  .( low 0-10 high) Denies suicidal  homicidal ideations  .  No auditory hallucinations  No pain concerns . Appropriate ADL'S. Interacting with peers and staff.  A: Encourage patient participation with unit programming . Instruction  Given on  Medication , verbalize understanding. R: Voice no other concerns. Staff continue to monitor

## 2017-01-15 NOTE — Progress Notes (Signed)
[facility] MD Progress Note  01/15/2017 9:20 PM Muneer Leider  MRN:  [id number]  Subjective:   01/10/2017. Mr. Bisono is nonverbal today again. Moreover, he does not eat, drink or take medications. There is risk of dehydration. Will check labs. He did not respond to the usual treatment with [facility]. Per nursing report was somewhat better following ECT yestarday.  01/11/2017. Mr. Vanlanen remains nonverbal. He can be waken up and opens his eyes without looking at me. He does not indicate that he hears or understands the questions. He does not eat, drink or pee. Labs done yesterday remain normal.WBC 11.1.  Follow-up note for Monday the 11th. Patient had his second ECT treatment of this cycle today. When the patient was seen in the afternoon there was a dramatic improvement. He was awake and easily arousable and fully verbal. He had little or no memory of his hospitalization or the events that led him to this point. Affect was confused but euthymic. Not psychotic. Not suicidal. Cooperative with treatment. Eating and drinking more today now that he is awake.  Follow-up note for Tuesday the 12th. Patient is back into his unresponsive condition today. Saw him this morning and while he briefly made eye contact and said 3 words to me he would not engage in any conversation or follow any further commands. We seemed to be having a little bit of gradual progress but it is slow and always prone to reversal. Read social work note today. Appreciate the attention to following up with his social condition.  Follow-up for Wednesday the 13th. As of this morning the patient seems to have shown a lasting recovery from his catatonia. He was verbal and appropriate prior to treatment this morning and remains verbal and interactive in the afternoon. Mood is stated as being pretty good although a little anxious. He is cooperative and tolerating treatment. He denies that he's having any active psychosis. He is better able to discuss the  history of his recent condition today. Patient made some contact this morning with his children which was obviously very reassuring for him.  Follow-up for Thursday the 14th. Patient has been awake and active all day. Attending groups appropriately. On interview today he says he is feeling much better. Denies being depressed. Denies any hallucinations or psychotic symptoms. Nursing confirms that he has been awake and active and appropriate all day. At the same time he does not appear to be manic or psychotic. He made some contact with the people who are taking care of his children today. Seems to have a pretty realistic grip on what's going on. He is tolerating medication well. Cooperative with treatment.  Per nursing: Patient has remained in bed this entire shift. Will not respond. Will open eyes when named is called but no other response. Patient not taking medications or eating.Safety checks maintained.   Principal Problem: MDD (major depressive disorder), recurrent, with catatonic features (HCC) Diagnosis:   Patient Active Problem List   Diagnosis Date Noted  . Cannabis use disorder, moderate, dependence (HCC) [F12.20] 01/09/2017  . MDD (major depressive disorder), recurrent, with catatonic features (HCC) [F33.9, F06.1] 08/09/2016  . Severe recurrent major depression with psychotic features (HCC) [F33.3] 07/21/2016  . MDD (major depressive disorder) [F32.9] 07/21/2016  . PTSD (post-traumatic stress disorder) [F43.10] 07/15/2016  . OCD (obsessive compulsive disorder) [F42.9] 07/15/2016  . 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 Bradycardia [R00.1] 09/18/2012  . Decreased oral intake [R63.8] 09/17/2012  . Psychosis [F29]  09/09/2012  . Severe episode of recurrent major depressive disorder, with psychotic features (HCC) [F33.3] 09/08/2012  . History of substance abuse [Z87.898] 09/08/2012   Total Time spent with patient: 30 minutes  Past  Psychiatric History: depression, catatonia.  Past Medical History:  Past Medical History:  Diagnosis Date  . Depression   . Herniated disc    x3  . Hypertension   . Mental disorder     Past Surgical History:  Procedure Laterality Date  . SHOULDER SURGERY     Family History: History reviewed. No pertinent family history. Family Psychiatric  History: father committed suicida. Social History:  History  Alcohol Use No    Comment: Pt is nonverbal. Unknown.      History  Drug Use No    Comment: Pt is nonverbal. Unknown.     Social History   Social History  . Marital status: Single    Spouse name: N/A  . Number of children: N/A  . Years of education: N/A   Social History Main Topics  . Smoking status: Former Smoker    Packs/day: 1.00    Types: Cigarettes    Quit date: 04/04/2012  . Smokeless tobacco: Never Used     Comment: Pt is nonverbal. Unknown.   . Alcohol use No     Comment: Pt is nonverbal. Unknown.   . Drug use: No     Comment: Pt is nonverbal. Unknown.   Marland Kitchen Sexual activity: No   Other Topics Concern  . None   Social History Narrative  . None   Additional Social History:                         Sleep: Fair  Appetite:  Poor  Current Medications: Current Facility-Administered Medications  Medication Dose Route Frequency Provider Last Rate Last Dose  . acetaminophen (TYLENOL) tablet 650 mg  650 mg Oral Q6H PRN Charm Rings, NP      . alum & mag hydroxide-simeth (MAALOX/MYLANTA) 200-200-20 MG/5ML suspension 30 mL  30 mL Oral Q4H PRN Charm Rings, NP      . ARIPiprazole (ABILIFY) tablet 15 mg  15 mg Oral Daily Omara Alcon, Jackquline Denmark, MD   15 mg at 01/15/17 0811  . fluvoxaMINE (LUVOX) tablet 100 mg  100 mg Oral BID Albertia Carvin, Jackquline Denmark, MD   100 mg at 01/15/17 1723  . magnesium hydroxide (MILK OF MAGNESIA) suspension 30 mL  30 mL Oral Daily PRN Charm Rings, NP      . midazolam (VERSED) injection 2 mg  2 mg Intravenous Once Quinteria Chisum, Jackquline Denmark, MD         Lab Results:  Results for orders placed or performed during the hospital encounter of 01/05/17 (from the past 48 hour(s))  Glucose, capillary     Status: Abnormal   Collection Time: 01/14/17  8:06 AM  Result Value Ref Range   Glucose-Capillary 134 (H) 65 - 99 mg/dL    Blood Alcohol level:  Lab Results  Component Value Date   ETH <5 01/03/2017   ETH <5 01/01/2017    Metabolic Disorder Labs: Lab Results  Component Value Date   HGBA1C 5.2 07/15/2016   MPG 103 07/15/2016   No results found for: PROLACTIN Lab Results  Component Value Date   CHOL 187 07/15/2016   TRIG 86 07/15/2016   HDL 30 (L) 07/15/2016   CHOLHDL 6.2 07/15/2016   VLDL 17 07/15/2016   LDLCALC 140 (H) 07/15/2016  Physical Findings: AIMS: Facial and Oral Movements Muscles of Facial Expression: None, normal Lips and Perioral Area: None, normal Jaw: None, normal Tongue: None, normal,Extremity Movements Upper (arms, wrists, hands, fingers): None, normal Lower (legs, knees, ankles, toes): None, normal, Trunk Movements Neck, shoulders, hips: None, normal, Overall Severity Severity of abnormal movements (highest score from questions above): None, normal Incapacitation due to abnormal movements: None, normal Patient's awareness of abnormal movements (rate only patient's report): No Awareness, Dental Status Current problems with teeth and/or dentures?: No Does patient usually wear dentures?: No  CIWA:    COWS:     Musculoskeletal: Strength & Muscle Tone: abnormal Gait & Station: unable to stand Patient leans: N/A  Psychiatric Specialty Exam: Physical Exam  Nursing note and vitals reviewed. Psychiatric: He has a normal mood and affect. His speech is normal. His affect is not blunt. He is not slowed and not withdrawn. He expresses no homicidal and no suicidal ideation. He exhibits abnormal recent memory.    Review of Systems  Unable to perform ROS: Patient unresponsive  Constitutional: Negative.    HENT: Negative.   Eyes: Negative.   Respiratory: Negative.   Cardiovascular: Negative.   Gastrointestinal: Negative.   Musculoskeletal: Negative.   Skin: Negative.   Neurological: Negative.   Psychiatric/Behavioral: Negative for depression, hallucinations, memory loss, substance abuse and suicidal ideas. The patient is not nervous/anxious and does not have insomnia.     Blood pressure 93/60, pulse 66, temperature 98.3 F (36.8 C), resp. rate 18, height 5\' 10"  (1.778 m), weight 71.2 kg (157 lb), SpO2 97 %.Body mass index is 22.53 kg/m.  General Appearance: Disheveled  Eye Contact:  Fair  Speech:  Clear and Coherent  Volume:  Decreased  Mood:  Depressed  Affect:  Flat  Thought Process:  Goal Directed  Orientation:  Full (Time, Place, and Person)  Thought Content:  NA  Suicidal Thoughts:  No  Homicidal Thoughts:  No  Memory:  NA  Judgement:  NA  Insight:  NA  Psychomotor Activity:  Psychomotor Retardation  Concentration:  Concentration: NA and Attention Span: NA  Recall:  NA  Fund of Knowledge:  NA  Language:  NA  Akathisia:  Negative  Handed:  Right  AIMS (if indicated):     Assets:  Desire for Improvement  ADL's:  Impaired  Cognition:  Impaired,  Mild  Sleep:  Number of Hours: 7.15     Treatment Plan Summary: Daily contact with patient to assess and evaluate symptoms and progress in treatment and Medication management   [name] is a 36 year old male with a history of catatonic depression admitted for ECT.  1. Catatonia. The patient is unable to provide ROS. He is receiving ECT.  2. Mood and psychosis. We continued Abilify for depression.  3. Anxiety. We continued Luvox for OCD and PTSD.  4. Metabolic syndrome monitoring. Labs were done on 07/15/2017 normal except WBC 11.1.  5. EKG. Sinus bradycardia, QTc 398.  6. Disposition. He will be discharged to home. [facility]  Patient appears to of had a great deal of recovery with a brief course of  ECT and getting back on his medication. We will do ECT again tomorrow morning and after that consider discharge in the afternoon with follow-up through his outpatient provider. Reviewed case and situation with nursing and social work.[name].  Eudell Julian, MD 01/15/2017, 9:20 PM

## 2017-01-15 NOTE — Progress Notes (Signed)
Benjamin [name] made a followup with Pt. Benjamin [name] met pt. Pt told Benjamin [name] he was depressed but was not taking medications for depression and anxiety. Pt stated he had a lot of things going on in his life; he is separated from his wife, has children who he said were now living with his friend, and has not had a job since January this year. Pt stated he was thinking too much when he had a meltdown but he has confidence in getting back up on his feet. Pt mentioned that he was going to do three things: First, he is going back home. Second, he going to find himself a job as that will rebuild his self-esteem.  Third, he was going to bring his children home. And fourth, he is going to use his support system. Pt denied having suicidal thoughts, and he requested for prayers for things he pans to do. CH offered prayers for Pt and his family.

## 2017-01-15 NOTE — [facility] Group Notes (Signed)
Goals Group Date/Time: 01/15/2017 9:00 AM Type of Therapy and Topic: Group Therapy: Goals Group: SMART Goals   Participation Level: Moderate  Description of Group:    The purpose of a daily goals group is to assist and guide patients in setting recovery/wellness-related goals. The objective is to set goals as they relate to the crisis in which they were admitted. Patients will be using SMART goal modalities to set measurable goals. Characteristics of realistic goals will be discussed and patients will be assisted in setting and processing how one will reach their goal. Facilitator will also assist patients in applying interventions and coping skills learned in psycho-education groups to the SMART goal and process how one will achieve defined goal.   Therapeutic Goals:   -Patients will develop and document one goal related to or their crisis in which brought them into treatment.  -Patients will be guided by LCSW using SMART goal setting modality in how to set a measurable, attainable, realistic and time sensitive goal.  -Patients will process barriers in reaching goal.  -Patients will process interventions in how to overcome and successful in reaching goal.   Patient's Goal: to meet with DSS social worker regarding his children and to make more arrangements for those same children.   Therapeutic Modalities:  Motivational Interviewing  Research officer, political partyCognitive Behavioral Therapy  Crisis Intervention Model  SMART goals setting   Daleen SquibbGreg Alaycia Eardley, Kentucky[place]CSW

## 2017-01-15 NOTE — [facility] Group Notes (Signed)
[facility] LCSW Group Therapy  01/15/2017 1:42 PM  Type of Therapy:  Group Therapy  Participation Level:  Active  Participation Quality:  Appropriate and Sharing  Affect:  Appropriate  Cognitive:  Alert  Insight:  Developing/Improving  Engagement in Therapy:  Developing/Improving  Modes of Intervention:  Activity, Discussion, Education, Problem-solving, Reality Testing, Socialization and Support  Summary of Progress/Problems: Balance in life: Patients will discuss the concept of balance and how it looks and feels to be unbalanced. Pt will identify areas in their life that is unbalanced and ways to become more balanced. They discussed what aspects in their lives has influenced their self care. Patients also discussed self care in the areas of self regulation/control, hygiene/appearance, sleep/relaxation, healthy leisure, healthy eating habits, exercise, inner peace/spirituality, self improvement, sobriety, and health management. They were challenged to identify changes that are needed in order to improve self care.  Vernal Rutan G. Garnette CzechSampson MSW, LCSWA 01/15/2017, 1:44 PM

## 2017-01-15 NOTE — Progress Notes (Signed)
Patient ID: Benjamin [name], male   DOB: September 28, 1980, 36 y.o.   MRN: [id number] Spontaneous response on prompt, patient sat up in bed, talked with me, appreciated care provided by all clinicians to "get him back, ECT is working" denied SI/HI/AVH.

## 2017-01-15 NOTE — Plan of Care (Signed)
Problem: Activity: Goal: Sleeping patterns will improve Outcome: Progressing Patient slept for Estimated Hours of 7.15; q15 minutes safety round maintained, no injury or falls during this shift.    

## 2017-01-15 NOTE — Progress Notes (Signed)
Recreation Therapy Notes  Date: 06.14.18 Time: 9:30 am Location: Craft Room  Group Topic: Leisure Education  Goal Area(s) Addresses:  Patient will identify things they are grateful for. Patient will identify how being grateful can influence decision making.  Behavioral Response: Attentive, Interactive  Intervention: Grateful Wheel  Activity: Patients were given an I Am Grateful For worksheet and were instructed to write things they are grateful for under each category.   Education: LRT educated patients on leisure.  Education Outcome: Acknowledges education/In group clarification offered   Clinical Observations/Feedback: Patient wrote things he is grateful for. Patient contributed to group discussion by stating things he is grateful for, the common thread, and how being aware of what his is grateful for affects how he makes decisions.  Jacquelynn CreeGreene,Ernestyne Caldwell M, LRT/CTRS 01/15/2017 10:14 AM

## 2017-01-15 NOTE — [facility] Group Notes (Signed)
[facility] Group Notes:  (Nursing/MHT/Case Management/Adjunct)  Date:  01/15/2017  Time:  9:56 PM  Type of Therapy:  Psychoeducational Skills  Participation Level:  Active  Participation Quality:  Appropriate, Attentive, Sharing and Supportive  Affect:  Appropriate  Cognitive:  Appropriate and Oriented  Insight:  Good  Engagement in Group:  Engaged  Modes of Intervention:  Discussion  Summary of Progress/Problems:  Benjamin [name] 01/15/2017, 9:56 PM

## 2017-01-16 ENCOUNTER — Encounter: Payer: Self-pay | Admitting: Anesthesiology

## 2017-01-16 ENCOUNTER — Inpatient Hospital Stay: Payer: No Typology Code available for payment source | Admitting: Anesthesiology

## 2017-01-16 LAB — GLUCOSE, CAPILLARY: Glucose-Capillary: 117 mg/dL — ABNORMAL HIGH (ref 65–99)

## 2017-01-16 MED ORDER — KETOROLAC TROMETHAMINE 30 MG/ML IJ SOLN: 30.0000 mg | Freq: Once | INTRAMUSCULAR | Status: DC

## 2017-01-16 MED ORDER — FLUVOXAMINE MALEATE 100 MG PO TABS: 100.0000 mg | ORAL_TABLET | Freq: Two times a day (BID) | ORAL | 1 refills | Status: DC

## 2017-01-16 MED ORDER — HALOPERIDOL LACTATE 5 MG/ML IJ SOLN
INTRAMUSCULAR | Status: AC
  Filled 2017-01-16: qty 1

## 2017-01-16 MED ORDER — ONDANSETRON HCL 4 MG/2ML IJ SOLN: 4.0000 mg | Freq: Once | INTRAMUSCULAR | Status: DC | PRN

## 2017-01-16 MED ORDER — FLUVOXAMINE MALEATE 100 MG PO TABS: 100.0000 mg | ORAL_TABLET | Freq: Two times a day (BID) | ORAL | 0 refills | Status: DC

## 2017-01-16 MED ORDER — SODIUM CHLORIDE 0.9 % IV SOLN
500.0000 mL | Freq: Once | INTRAVENOUS | Status: AC
  Administered 2017-01-16: 500 mL via INTRAVENOUS

## 2017-01-16 MED ORDER — ARIPIPRAZOLE 15 MG PO TABS: 15.0000 mg | ORAL_TABLET | Freq: Every day | ORAL | 1 refills | Status: DC

## 2017-01-16 MED ORDER — ARIPIPRAZOLE 15 MG PO TABS: 15.0000 mg | ORAL_TABLET | Freq: Every day | ORAL | 0 refills | Status: DC

## 2017-01-16 MED ORDER — METHOHEXITAL SODIUM 100 MG/10ML IV SOSY
PREFILLED_SYRINGE | INTRAVENOUS | Status: DC | PRN
  Administered 2017-01-16: 80 mg via INTRAVENOUS

## 2017-01-16 MED ORDER — FENTANYL CITRATE (PF) 100 MCG/2ML IJ SOLN: 25.0000 ug | INTRAMUSCULAR | Status: DC | PRN

## 2017-01-16 MED ORDER — LABETALOL HCL 5 MG/ML IV SOLN
INTRAVENOUS | Status: AC
  Filled 2017-01-16: qty 4

## 2017-01-16 MED ORDER — MIDAZOLAM HCL 2 MG/2ML IJ SOLN
2.0000 mg | Freq: Once | INTRAMUSCULAR | Status: AC
  Administered 2017-01-16: 2 mg via INTRAVENOUS

## 2017-01-16 MED ORDER — SODIUM CHLORIDE 0.9 % IV SOLN
INTRAVENOUS | Status: DC | PRN
  Administered 2017-01-16: 11:00:00 via INTRAVENOUS

## 2017-01-16 MED ORDER — SUCCINYLCHOLINE CHLORIDE 200 MG/10ML IV SOSY
PREFILLED_SYRINGE | INTRAVENOUS | Status: DC | PRN
  Administered 2017-01-16: 100 mg via INTRAVENOUS

## 2017-01-16 MED ORDER — SODIUM CHLORIDE 0.9 % IV SOLN: 500.0000 mL | Freq: Once | INTRAVENOUS | Status: DC

## 2017-01-16 MED ORDER — MIDAZOLAM HCL 2 MG/2ML IJ SOLN
INTRAMUSCULAR | Status: AC
  Filled 2017-01-16: qty 2

## 2017-01-16 MED ORDER — HALOPERIDOL LACTATE 5 MG/ML IJ SOLN
INTRAMUSCULAR | Status: DC | PRN
  Administered 2017-01-16: 5 mg via INTRAVENOUS

## 2017-01-16 MED ORDER — SUCCINYLCHOLINE CHLORIDE 20 MG/ML IJ SOLN
INTRAMUSCULAR | Status: AC
  Filled 2017-01-16: qty 1

## 2017-01-16 MED ORDER — ONDANSETRON HCL 4 MG/2ML IJ SOLN: 4.0000 mg | Freq: Once | INTRAMUSCULAR | Status: DC

## 2017-01-16 NOTE — Transfer of Care (Signed)
Immediate Anesthesia Transfer of Care Note  Patient: Benjamin [name]  Procedure(s) Performed: ECT  Patient Location: PACU  Anesthesia Type:General  Level of Consciousness: sedated  Airway & Oxygen Therapy: Patient Spontanous Breathing  Post-op Assessment: Report given to RN and Post -op Vital signs reviewed and stable  Post vital signs: Reviewed and stable  Last Vitals:  Vitals:   01/16/17 0847 01/16/17 1132  BP:  120/75  Pulse:  70  Resp: 18 16  Temp:  36.8 C    Last Pain:  Vitals:   01/16/17 1132  TempSrc:   PainSc: Asleep         Complications: No apparent anesthesia complications

## 2017-01-16 NOTE — Anesthesia Preprocedure Evaluation (Signed)
Anesthesia Evaluation  Patient identified by MRN, date of birth, ID band Patient awake    Reviewed: Allergy & Precautions, NPO status , Patient's Chart, lab work & pertinent test results  Airway Mallampati: II       Dental  (+) Teeth Intact   Pulmonary neg pulmonary ROS, former smoker,    breath sounds clear to auscultation       Cardiovascular hypertension,  Rhythm:Regular     Neuro/Psych    GI/Hepatic negative GI ROS, Neg liver ROS,   Endo/Other  negative endocrine ROS  Renal/GU negative Renal ROS     Musculoskeletal negative musculoskeletal ROS (+)   Abdominal Normal abdominal exam  (+)   Peds negative pediatric ROS (+)  Hematology negative hematology ROS (+)   Anesthesia Other Findings   Reproductive/Obstetrics negative OB ROS                             Anesthesia Physical  Anesthesia Plan  ASA: II  Anesthesia Plan: General   Post-op Pain Management:    Induction: Intravenous  PONV Risk Score and Plan: 0  Airway Management Planned: Mask  Additional Equipment:   Intra-op Plan:   Post-operative Plan:   Informed Consent: I have reviewed the patients History and Physical, chart, labs and discussed the procedure including the risks, benefits and alternatives for the proposed anesthesia with the patient or authorized representative who has indicated his/her understanding and acceptance.     Plan Discussed with: CRNA  Anesthesia Plan Comments:         Anesthesia Quick Evaluation

## 2017-01-16 NOTE — Progress Notes (Signed)
Patient ID: Benjamin [name], male   DOB: Sep 20, 1980, 36 y.o.   MRN: [id number] Significantly improving, no elect mutism, A&Ox3, talking, engaged in constructive conversation; NPO past MN for ECT, denied SI/HI/SIB/AVH.

## 2017-01-16 NOTE — Anesthesia Procedure Notes (Signed)
Date/Time: 01/16/2017 11:23 AM Performed by: Lily KocherPERALTA, Benjamin Sherk Pre-anesthesia Checklist: Patient identified, Emergency Drugs available, Suction available and Patient being monitored Patient Re-evaluated:Patient Re-evaluated prior to inductionOxygen Delivery Method: Circle system utilized Preoxygenation: Pre-oxygenation with 100% oxygen Intubation Type: IV induction Ventilation: Mask ventilation without difficulty and Mask ventilation throughout procedure Airway Equipment and Method: Bite block Placement Confirmation: positive ETCO2 Dental Injury: Teeth and Oropharynx as per pre-operative assessment

## 2017-01-16 NOTE — Tx Team (Signed)
Interdisciplinary Treatment and Diagnostic Plan Update  01/16/2017 Time of Session: 1:45pm Cherly HensenJoshua Kelliher MRN: [id number]  Principal Diagnosis: MDD (major depressive disorder), recurrent, with catatonic features (HCC)  Secondary Diagnoses: Principal Problem:   MDD (major depressive disorder), recurrent, with catatonic features (HCC) Active Problems:   PTSD (post-traumatic stress disorder)   OCD (obsessive compulsive disorder)   Cannabis use disorder, moderate, dependence (HCC)   Current Medications:  Current Facility-Administered Medications  Medication Dose Route Frequency Provider Last Rate Last Dose  . 0.9 %  sodium chloride infusion  500 mL Intravenous Once Clapacs, John T, MD      . 0.9 %  sodium chloride infusion  500 mL Intravenous Once Clapacs, John T, MD      . acetaminophen (TYLENOL) tablet 650 mg  650 mg Oral Q6H PRN Charm RingsLord, Jamison Y, NP      . alum & mag hydroxide-simeth (MAALOX/MYLANTA) 200-200-20 MG/5ML suspension 30 mL  30 mL Oral Q4H PRN Charm RingsLord, Jamison Y, NP      . ARIPiprazole (ABILIFY) tablet 15 mg  15 mg Oral Daily Clapacs, Jackquline DenmarkJohn T, MD   15 mg at 01/16/17 1308  . fluvoxaMINE (LUVOX) tablet 100 mg  100 mg Oral BID Clapacs, Jackquline DenmarkJohn T, MD   100 mg at 01/16/17 1308  . magnesium hydroxide (MILK OF MAGNESIA) suspension 30 mL  30 mL Oral Daily PRN Charm RingsLord, Jamison Y, NP       PTA Medications: Prescriptions Prior to Admission  Medication Sig Dispense Refill Last Dose  . ARIPiprazole (ABILIFY) 10 MG tablet Take 1 tablet (10 mg total) by mouth daily. 30 tablet 0 unknown at unknown  . fluvoxaMINE (LUVOX) 50 MG tablet Take 3 tablets (150 mg total) by mouth at bedtime. 90 tablet 0 unknown at unknown  . hydrOXYzine (ATARAX/VISTARIL) 25 MG tablet Take 1 tablet (25 mg total) by mouth every 6 (six) hours as needed for anxiety. 30 tablet 0 unknown at unknown  . traZODone (DESYREL) 100 MG tablet Take 1 tablet (100 mg total) by mouth at bedtime as needed for sleep. 30 tablet 0 unknown at  unknown    Patient Stressors: Financial difficulties Loss of children Medication change or noncompliance  Patient Strengths: Average or above average Chief Operating Officerintelligence Communication skills Motivation for treatment/growth  Treatment Modalities: Medication Management, Group therapy, Case management,  1 to 1 session with clinician, Psychoeducation, Recreational therapy.   Physician Treatment Plan for Primary Diagnosis: MDD (major depressive disorder), recurrent, with catatonic features (HCC) Long Term Goal(s): Improvement in symptoms so as ready for discharge Improvement in symptoms so as ready for discharge   Short Term Goals: Ability to demonstrate self-control will improve Compliance with prescribed medications will improve Ability to verbalize feelings will improve  Medication Management: Evaluate patient's response, side effects, and tolerance of medication regimen.  Therapeutic Interventions: 1 to 1 sessions, Unit Group sessions and Medication administration.  Evaluation of Outcomes: Adequate for Discharge  Physician Treatment Plan for Secondary Diagnosis: Principal Problem:   MDD (major depressive disorder), recurrent, with catatonic features (HCC) Active Problems:   PTSD (post-traumatic stress disorder)   OCD (obsessive compulsive disorder)   Cannabis use disorder, moderate, dependence (HCC)  Long Term Goal(s): Improvement in symptoms so as ready for discharge Improvement in symptoms so as ready for discharge   Short Term Goals: Ability to demonstrate self-control will improve Compliance with prescribed medications will improve Ability to verbalize feelings will improve     Medication Management: Evaluate patient's response, side effects, and tolerance of medication regimen.  Therapeutic Interventions: 1 to 1 sessions, Unit Group sessions and Medication administration.  Evaluation of Outcomes: Adequate for Discharge   RN Treatment Plan for Primary Diagnosis: MDD  (major depressive disorder), recurrent, with catatonic features (HCC) Long Term Goal(s): Knowledge of disease and therapeutic regimen to maintain health will improve  Short Term Goals: Ability to remain free from injury will improve, Ability to identify and develop effective coping behaviors will improve and Compliance with prescribed medications will improve  Medication Management: RN will administer medications as ordered by provider, will assess and evaluate patient's response and provide education to patient for prescribed medication. RN will report any adverse and/or side effects to prescribing provider.  Therapeutic Interventions: 1 on 1 counseling sessions, Psychoeducation, Medication administration, Evaluate responses to treatment, Monitor vital signs and CBGs as ordered, Perform/monitor CIWA, COWS, AIMS and Fall Risk screenings as ordered, Perform wound care treatments as ordered.  Evaluation of Outcomes: Adequate for Discharge   LCSW Treatment Plan for Primary Diagnosis: MDD (major depressive disorder), recurrent, with catatonic features (HCC) Long Term Goal(s): Safe transition to appropriate next level of care at discharge, Engage patient in therapeutic group addressing interpersonal concerns.  Short Term Goals: Engage patient in aftercare planning with referrals and resources and Increase social support  Therapeutic Interventions: Assess for all discharge needs, 1 to 1 time with Social worker, Explore available resources and support systems, Assess for adequacy in community support network, Educate family and significant other(s) on suicide prevention, Complete Psychosocial Assessment, Interpersonal group therapy.  Evaluation of Outcomes: Adequate for Discharge   Progress in Treatment: Attending groups: Yes. Participating in groups: Yes. Taking medication as prescribed: Yes. Toleration medication: Yes. Family/Significant other contact made: Yes, individual(s) contacted:   brother Patient understands diagnosis: Yes. Discussing patient identified problems/goals with staff: Yes. Medical problems stabilized or resolved: Yes. Denies suicidal/homicidal ideation: Yes. Issues/concerns per patient self-inventory: No. Other: n/a  New problem(s) identified: None identified at this time.   New Short Term/Long Term Goal(s): None identified at this time.   Discharge Plan or Barriers: Patient will discharge home and follow-up with [facility] [place] for outpatient services.   Reason for Continuation of Hospitalization: Anticipated discharge 01/16/2017  Estimated Length of Stay: Anticipated discharge 01/16/2017  Attendees: Patient:  01/16/2017 1:50 PM  Physician: Audery Amel, MD 01/16/2017 1:50 PM  Nursing: Leonia Reader, RN 01/16/2017 1:50 PM  RN Care Manager: 01/16/2017 1:50 PM  Social Worker: Fredrich Birks. Garnette Czech MSW, LCSWA 01/16/2017 1:50 PM  Recreational Therapist:  01/16/2017 1:50 PM  Other:  01/16/2017 1:50 PM  Other:  01/16/2017 1:50 PM  Other: 01/16/2017 1:50 PM    Scribe for Treatment Team: Arelia Longest, LCSWA 01/16/2017 1:57 PM

## 2017-01-16 NOTE — Discharge Summary (Signed)
Physician Discharge Summary Note  Patient:  Benjamin [name] is an 36 y.o., male MRN:  [id number] DOB:  07-20-1981 Patient phone:  [phone] (home)  Patient address:   [address] [place] [address],  Total Time spent with patient: 45 minutes  Date of Admission:  01/05/2017 Date of Discharge: 01/16/2017  Reason for Admission:  This 36 year old man was admitted in transfer from one of the sister hospitals in [place] because of the need for electroconvulsive therapy. He was transferred to [place] on June 4. Patient has recently slipped into an extended phase of catatonia. He has had these episodes in the past but this one seems to be lasting longer than what was seen previously. Patient was initially treated with relatively high dose of benzodiazepine while we began preparations for ECT but without any clear response to the Ativan. He ate and drank very little. Communicated essentially none. Spent all of his time in his room staring at the wall. Would not verbally communicate with me at all. We did make contact with his brother who is his closest relative. Brother gave verbal consent to 2 nurses on the telephone for ECT treatment. Patient began ECT at the end of last week and has had a total of 5 treatments as of today. After the second treatment the patient showed a dramatic improvement in his presentation although he slipped back into catatonia after a brief time. Since his third treatment he has shown a lasting improvement. For over 2 days now he has been consistently showing a normal mental state. He is awake appropriately during the day communicates verbally does not show psychosis affect is appropriate. He has been cooperative with treatment. Patient has also been treated with fluvoxamine and Abilify the dose of which has been increased to 15 mg a day. Patient has not attempted to harm himself or anyone else. At this point he seems to of return to a mental state free of any specific symptoms. Asian  expresses insight and agrees to outpatient treatment  Principal Problem: MDD (major depressive disorder), recurrent, with catatonic features [facility]) Discharge Diagnoses: Patient Active Problem List   Diagnosis Date Noted  . Cannabis use disorder, moderate, dependence (HCC) [F12.20] 01/09/2017  . MDD (major depressive disorder), recurrent, with catatonic features (HCC) [F33.9, F06.1] 08/09/2016  . Severe recurrent major depression with psychotic features (HCC) [F33.3] 07/21/2016  . MDD (major depressive disorder) [F32.9] 07/21/2016  . PTSD (post-traumatic stress disorder) [F43.10] 07/15/2016  . OCD (obsessive compulsive disorder) [F42.9] 07/15/2016  . 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 Bradycardia [R00.1] 09/18/2012  . Decreased oral intake [R63.8] 09/17/2012  . Psychosis [F29] 09/09/2012  . Severe episode of recurrent major depressive disorder, with psychotic features (HCC) [F33.3] 09/08/2012  . History of substance abuse [Z87.898] 09/08/2012    Past Psychiatric History: He has a past history of similar episodes of catatonia. Diagnosis of depression with psychotic features as an underlying illness. Had done relatively well in the past with antidepressants and Abilify but had stopped his medication probably a couple months ago.  Past Medical History:  Past Medical History:  Diagnosis Date  . Depression   . Herniated disc    x3  . Hypertension   . Mental disorder     Past Surgical History:  Procedure Laterality Date  . SHOULDER SURGERY     Family History: History reviewed. No pertinent family history. Family Psychiatric  History: None known Social History:  History  Alcohol Use No  Comment: Pt is nonverbal. Unknown.      History  Drug Use No    Comment: Pt is nonverbal. Unknown.     Social History   Social History  . Marital status: Single    Spouse name: N/A  . Number of children: N/A  . Years of education: N/A    Social History Main Topics  . Smoking status: Former Smoker    Packs/day: 1.00    Types: Cigarettes    Quit date: 04/04/2012  . Smokeless tobacco: Never Used     Comment: Pt is nonverbal. Unknown.   . Alcohol use No     Comment: Pt is nonverbal. Unknown.   . Drug use: No     Comment: Pt is nonverbal. Unknown.   Marland Kitchen Sexual activity: No   Other Topics Concern  . None   Social History Narrative  . None    Hospital Course:  See my note above for some of the details. Essentially the patient was treated with ECT and has shown a appropriate response after 5 treatments he is consistently now back to his baseline state without signs of acute dangerousness. Appears to be cognitively intact enough to make reasonable decisions to care for himself. He is going to be discharged today. He does not have either transportation or funding to allow for outpatient maintenance ECT. Instead he is going to follow-up with his previous provider as well as [facility] in [place]. He is given a 7 day supply of both of his medicines, Abilify and fluvoxamine as well as 30 day supplies with 1 refill. Strongly encouraged to stay on his medication and to stay in contact with social supports. He agrees to the plan.  Physical Findings: AIMS: Facial and Oral Movements Muscles of Facial Expression: None, normal Lips and Perioral Area: None, normal Jaw: None, normal Tongue: None, normal,Extremity Movements Upper (arms, wrists, hands, fingers): None, normal Lower (legs, knees, ankles, toes): None, normal, Trunk Movements Neck, shoulders, hips: None, normal, Overall Severity Severity of abnormal movements (highest score from questions above): None, normal Incapacitation due to abnormal movements: None, normal Patient's awareness of abnormal movements (rate only patient's report): No Awareness, Dental Status Current problems with teeth and/or dentures?: No Does patient usually wear dentures?: No  CIWA:    COWS:      Musculoskeletal: Strength & Muscle Tone: within normal limits Gait & Station: normal Patient leans: N/A  Psychiatric Specialty Exam: Physical Exam  Nursing note and vitals reviewed. Constitutional: He appears well-developed and well-nourished.  HENT:  Head: Normocephalic and atraumatic.  Eyes: Conjunctivae are normal. Pupils are equal, round, and reactive to light.  Neck: Normal range of motion.  Cardiovascular: Regular rhythm and normal heart sounds.   Respiratory: Effort normal. No respiratory distress.  GI: Soft.  Musculoskeletal: Normal range of motion.  Neurological: He is alert.  Skin: Skin is warm and dry.  Psychiatric: He has a normal mood and affect. His speech is normal and behavior is normal. Judgment and thought content normal. He exhibits abnormal recent memory.    Review of Systems  Constitutional: Negative.   HENT: Negative.   Eyes: Negative.   Respiratory: Negative.   Cardiovascular: Negative.   Gastrointestinal: Negative.   Musculoskeletal: Negative.   Skin: Negative.   Neurological: Negative.   Psychiatric/Behavioral: Negative.     Blood pressure 109/75, pulse 70, temperature 98.4 F (36.9 C), resp. rate 12, height 5\' 10"  (1.778 m), weight 78.9 kg (174 lb), SpO2 95 %.Body mass index is 24.97 kg/m.  General Appearance: Fairly Groomed  Eye Contact:  Good  Speech:  Clear and Coherent  Volume:  Normal  Mood:  Euthymic  Affect:  Appropriate  Thought Process:  Goal Directed  Orientation:  Full (Time, Place, and Person)  Thought Content:  Logical  Suicidal Thoughts:  No  Homicidal Thoughts:  No  Memory:  Immediate;   Good Recent;   Fair Remote;   Fair  Judgement:  Fair  Insight:  Fair  Psychomotor Activity:  Normal  Concentration:  Concentration: Good  Recall:  Fair  Fund of Knowledge:  Good  Language:  Good  Akathisia:  No  Handed:  Right  AIMS (if indicated):     Assets:  Communication Skills Desire for Improvement Housing Leisure  Time Physical Health Social Support  ADL's:  Intact  Cognition:  WNL  Sleep:  Number of Hours: 7.45     Have you used any form of tobacco in the last 30 days? (Cigarettes, Smokeless Tobacco, Cigars, and/or Pipes): No  Has this patient used any form of tobacco in the last 30 days? (Cigarettes, Smokeless Tobacco, Cigars, and/or Pipes) Yes, No  Blood Alcohol level:  Lab Results  Component Value Date   ETH <5 01/03/2017   ETH <5 01/01/2017    Metabolic Disorder Labs:  Lab Results  Component Value Date   HGBA1C 5.2 07/15/2016   MPG 103 07/15/2016   No results found for: PROLACTIN Lab Results  Component Value Date   CHOL 187 07/15/2016   TRIG 86 07/15/2016   HDL 30 (L) 07/15/2016   CHOLHDL 6.2 07/15/2016   VLDL 17 07/15/2016   LDLCALC 140 (H) 07/15/2016    See Psychiatric Specialty Exam and Suicide Risk Assessment completed by Attending Physician prior to discharge.  Discharge destination:  Home  Is patient on multiple antipsychotic therapies at discharge:  No   Has Patient had three or more failed trials of antipsychotic monotherapy by history:  No  Recommended Plan for Multiple Antipsychotic Therapies: NA  Discharge Instructions    Diet - low sodium heart healthy    Complete by:  As directed    Increase activity slowly    Complete by:  As directed      Allergies as of 01/16/2017   No Known Allergies     Medication List    STOP taking these medications   hydrOXYzine 25 MG tablet Commonly known as:  ATARAX/VISTARIL   traZODone 100 MG tablet Commonly known as:  DESYREL     TAKE these medications     Indication  ARIPiprazole 15 MG tablet Commonly known as:  ABILIFY Take 1 tablet (15 mg total) by mouth daily. Start taking on:  01/17/2017 What changed:  medication strength  how much to take  Indication:  Mixed Bipolar Affective Disorder   fluvoxaMINE 100 MG tablet Commonly known as:  LUVOX Take 1 tablet (100 mg total) by mouth 2 (two) times  daily. What changed:  medication strength  how much to take  when to take this  Indication:  Depression      Follow-up Information    [facility] Follow up on 03/02/2017.   Specialty:  Professional Counselor Why:  Follow-up appointment at 3:00pm with your therapist Synetta Failnita. Bring discharge summary with you to this appointment.  Contact information: [facility] [address] [place] [address] [phone]        [facility].   Specialty:  Behavioral Health Why:  Follow[facility] with [facility] for outpatient services including medication mangement and outpatient therapy. Bring I.D. and current medications with you. Walk-in hours for new patients are M-F 8a-4p.  [address] [place] [address] [phone]           Follow-up recommendations:  Activity:  Activity level as tolerated Diet:  Regular diet Other:  Follow-up with outpatient providers as noted above stay on current medication. Stay in contact socially with people to assist in monitoring any changes in his mental state  Comments:     Signed: [name], MD 01/16/2017, 2:38 PM

## 2017-01-16 NOTE — Anesthesia Post-op Follow-up Note (Cosign Needed)
Anesthesia QCDR form completed.        

## 2017-01-16 NOTE — Progress Notes (Signed)
  [facility] Discharge [facility] :  Will you be returning to the same living situation after discharge:  Yes,  home At discharge, do you have transportation home?: Yes,  uber Do you have the ability to pay for your medications: Yes,  patient referred to free pharmacy.   Release of information consent forms completed and in the chart;  Patient's signature needed at discharge.  Patient to Follow up at: Follow-up Information    [facility] Follow up on 03/02/2017.   Specialty:  Professional Counselor Why:  Follow-up appointment at 3:00pm with your therapist Synetta Failnita. Bring discharge summary with you to this appointment.  Contact information: [facility] [address] [place] [address] [phone]        [facility].   Specialty:  Behavioral Health Why:  [facility] services including medication mangement and outpatient therapy. Bring I.D. and current medications with you. Walk-in hours for new patients are M-F 8a-4p.  Contact information: [address] [place] [address] [phone]           Next level of care provider has [facility] to [facility] Link:no  Safety Planning and Suicide Prevention discussed: Yes,  with patient and brother.   Have you used any form of tobacco in the last 30 days? (Cigarettes, Smokeless Tobacco, Cigars, and/or Pipes): No  Has patient been referred to the Quitline?: N/A patient is not a smoker  Patient has been referred for addiction treatment: Yes  Keriana Sarsfield G. Garnette CzechSampson MSW, LCSWA 01/16/2017, 3:10 PM

## 2017-01-16 NOTE — Progress Notes (Signed)
Recreation Therapy Notes   Date: 06.15.18 Time: 1:00 pm Location: Craft Room  Group Topic: Social Skills  Goal Area(s) Addresses:  Patient will work in teams towards shared goal. Patient will verbalize skills needed to make activity successful. Patient will verbalize benefit of using skills identified to reach post d/c. goals.  Behavioral Response: Left early  Intervention: Landing Pad  Activity: Patients were put in groups and were given 15 straws and about 3 feet of tape. Patients were instructed to build a contraption that would catch and secure a golf ball being dropped from about 4 feet.  Education: LRT educated patients on healthy support systems.  Education Outcome: Patient left before LRT educated group.   Clinical Observations/Feedback: Patient left group at approximately 1:22 pm and did not return to group.  Jacquelynn CreeGreene,Kameryn Davern M, LRT/CTRS 01/16/2017 1:59 PM

## 2017-01-16 NOTE — Plan of Care (Signed)
Problem: Activity: Goal: Sleeping patterns will improve Outcome: Progressing Patient slept for Estimated Hours of 7.45; q15 minutes safety round maintained, no injury or falls during this shift.    

## 2017-01-16 NOTE — H&P (Signed)
Benjamin [name] is an 36 y.o. male.   Chief Complaint: No specific complaint. Mood is much better. Feeling back to normal. 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  Past Medical History:  Diagnosis Date  . Depression   . Herniated disc    x3  . Hypertension   . Mental disorder     Past Surgical History:  Procedure Laterality Date  . SHOULDER SURGERY      History reviewed. No pertinent family history. Social History:  reports that he quit smoking about 4 years ago. His smoking use included Cigarettes. He smoked 1.00 pack per day. He has never used smokeless tobacco. He reports that he does not drink alcohol or use drugs.  Allergies: No Known Allergies  Medications Prior to Admission  Medication Sig Dispense Refill  . ARIPiprazole (ABILIFY) 10 MG tablet Take 1 tablet (10 mg total) by mouth daily. 30 tablet 0  . fluvoxaMINE (LUVOX) 50 MG tablet Take 3 tablets (150 mg total) by mouth at bedtime. 90 tablet 0  . hydrOXYzine (ATARAX/VISTARIL) 25 MG tablet Take 1 tablet (25 mg total) by mouth every 6 (six) hours as needed for anxiety. 30 tablet 0  . traZODone (DESYREL) 100 MG tablet Take 1 tablet (100 mg total) by mouth at bedtime as needed for sleep. 30 tablet 0    Results for orders placed or performed during the hospital encounter of 01/05/17 (from the past 48 hour(s))  Glucose, capillary     Status: Abnormal   Collection Time: 01/16/17  6:48 AM  Result Value Ref Range   Glucose-Capillary 117 (H) 65 - 99 mg/dL   Comment 1 Notify RN    No results found.  Review of Systems  Constitutional: Negative.   HENT: Negative.   Eyes: Negative.   Respiratory: Negative.   Cardiovascular: Negative.   Gastrointestinal: Negative.   Musculoskeletal: Negative.   Skin: Negative.   Neurological: Negative.   Psychiatric/Behavioral: Negative.     Blood pressure 105/63, pulse 60, temperature 97.9 F (36.6 C), resp. rate 18, height 5\' 10"  (1.778 m),  weight 78.9 kg (174 lb), SpO2 98 %. Physical Exam  Nursing note and vitals reviewed. Constitutional: He appears well-developed and well-nourished.  HENT:  Head: Normocephalic and atraumatic.  Eyes: Conjunctivae are normal. Pupils are equal, round, and reactive to light.  Neck: Normal range of motion.  Cardiovascular: Regular rhythm and normal heart sounds.   Respiratory: Effort normal. No respiratory distress.  GI: Soft.  Musculoskeletal: Normal range of motion.  Neurological: He is alert.  Skin: Skin is warm and dry.  Psychiatric: He has a normal mood and affect. His behavior is normal. Judgment and thought content normal.     Assessment/Plan Patient appears to return to baseline tolerating treatment very well. Last ECT treatment today followed by plan to discharge  Benjamin RasmussenJohn Kendrew Paci, MD 01/16/2017, 11:17 AM

## 2017-01-16 NOTE — [facility] Group Notes (Signed)
[facility] Group Notes:  (Nursing/MHT/Case Management/Adjunct)  Date:  01/16/2017  Time:  3:55 PM  Type of Therapy:  Psychoeducational Skills  Participation Level:  Did Not Attend   ms:  Benjamin [name] 01/16/2017, 3:55 PM

## 2017-01-16 NOTE — Anesthesia Postprocedure Evaluation (Signed)
Anesthesia Post Note  Patient: Benjamin [name]  Procedure(s) Performed: * No procedures listed *  Patient location during evaluation: PACU Anesthesia Type: General Level of consciousness: awake and alert and oriented Pain management: pain level controlled Vital Signs Assessment: post-procedure vital signs reviewed and stable Respiratory status: spontaneous breathing Cardiovascular status: blood pressure returned to baseline Anesthetic complications: no     Last Vitals:  Vitals:   01/16/17 1152 01/16/17 1202  BP:    Pulse: 60 70  Resp: 12 12  Temp:  36.9 C    Last Pain:  Vitals:   01/16/17 1202  TempSrc:   PainSc: 0-No pain                 Nakeitha Milligan

## 2017-01-16 NOTE — [facility] Suicide Risk Assessment (Signed)
[facility] INPATIENT:  Family/Significant Other Suicide Prevention Education  Suicide Prevention Education:  Education Completed;Benjamin [name](brother [phone]), has been identified by the patient as the family member/significant other with whom the patient will be residing, and identified as the person(s) who will aid the patient in the event of a mental health crisis (suicidal ideations/suicide attempt).  With written consent from the patient, the family member/significant other has been provided the following suicide prevention education, prior to the and/or following the discharge of the patient.  The suicide prevention education provided includes the following:  Suicide risk factors  Suicide prevention and interventions  National Suicide Hotline telephone number  [facility] assessment telephone number  [facility] Emergency Assistance 911  [facility] and/or Residential Mobile Crisis Unit telephone number  Request made of family/significant other to:  Remove weapons (e.g., guns, rifles, knives), all items previously/currently identified as safety concern.    Remove drugs/medications (over-the-counter, prescriptions, illicit drugs), all items previously/currently identified as a safety concern.  The family member/significant other verbalizes understanding of the suicide prevention education information provided.  The family member/significant other agrees to remove the items of safety concern listed above.  Benjamin [name] MSW, LCSWA 01/16/2017, 3:14 PM

## 2017-01-16 NOTE — [facility] Suicide Risk Assessment (Signed)
[facility]   Principal Problem: MDD (major depressive disorder), recurrent, with catatonic features [facility](HCC) Discharge Diagnoses:  Patient Active Problem List   Diagnosis Date Noted  . Cannabis use disorder, moderate, dependence (HCC) [F12.20] 01/09/2017  . MDD (major depressive disorder), recurrent, with catatonic features (HCC) [F33.9, F06.1] 08/09/2016  . Severe recurrent major depression with psychotic features (HCC) [F33.3] 07/21/2016  . MDD (major depressive disorder) [F32.9] 07/21/2016  . PTSD (post-traumatic stress disorder) [F43.10] 07/15/2016  . OCD (obsessive compulsive disorder) [F42.9] 07/15/2016  . 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 Bradycardia [R00.1] 09/18/2012  . Decreased oral intake [R63.8] 09/17/2012  . Psychosis [F29] 09/09/2012  . Severe episode of recurrent major depressive disorder, with psychotic features (HCC) [F33.3] 09/08/2012  . History of substance abuse [Z87.898] 09/08/2012    Total Time spent with patient: 45 minutes  Musculoskeletal: Strength & Muscle Tone: within normal limits Gait & Station: normal Patient leans: N/A  Psychiatric Specialty Exam: Review of Systems  Constitutional: Negative.   HENT: Negative.   Eyes: Negative.   Respiratory: Negative.   Cardiovascular: Negative.   Gastrointestinal: Negative.   Musculoskeletal: Negative.   Skin: Negative.   Neurological: Negative.   Psychiatric/Behavioral: Positive for memory loss. Negative for depression, hallucinations, substance abuse and suicidal ideas. The patient is not nervous/anxious and does not have insomnia.     Blood pressure 109/75, pulse 70, temperature 98.4 F (36.9 C), resp. rate 12, height 5\' 10"  (1.778 m), weight 78.9 kg (174 lb), SpO2 95 %.Body mass index is 24.97 kg/m.  General Appearance: Fairly Groomed  Patent attorneyye Contact::  Good  Speech:  Clear and Coherent409  Volume:  Normal  Mood:  Euthymic   Affect:  Appropriate  Thought Process:  Goal Directed  Orientation:  Full (Time, Place, and Person)  Thought Content:  Logical  Suicidal Thoughts:  No  Homicidal Thoughts:  No  Memory:  Immediate;   Good Recent;   Fair Remote;   Fair  Judgement:  Fair  Insight:  Fair  Psychomotor Activity:  Normal  Concentration:  Fair  Recall:  FiservFair  Fund of Knowledge:Fair  Language: Fair  Akathisia:  No  Handed:  Right  AIMS (if indicated):     Assets:  Communication Skills Desire for Improvement Housing Physical Health Resilience  Sleep:  Number of Hours: 7.45  Cognition: WNL  ADL's:  Intact   Mental Status Per Nursing Assessment::   On Admission:  NA  Demographic Factors:  Male, Adolescent or young adult, Divorced or widowed, Caucasian and Unemployed  Loss Factors: Financial problems/change in socioeconomic status  Historical Factors: Impulsivity  Risk Reduction Factors:   Responsible for children under 38 years of age, Sense of responsibility to family, Religious beliefs about death, Positive social support and Positive therapeutic relationship  Continued Clinical Symptoms:  Depression:   Impulsivity  Cognitive Features That Contribute To Risk:  Loss of executive function    Suicide Risk:  Mild:  Suicidal ideation of limited frequency, intensity, duration, and specificity.  There are no identifiable plans, no associated intent, mild dysphoria and related symptoms, good self-control (both objective and subjective assessment), few other risk factors, and identifiable protective factors, including available and accessible social support.  Follow-up Information    [facility] Follow up on 03/02/2017.   Specialty:  Professional Counselor Why:  Follow-up appointment at 3:00pm with your therapist Synetta Failnita. Bring discharge summary with you to this appointment.  Contact information:  [facility] [address] [place]  [address] [phone]        [facility] Go on 01/19/2017.   Specialty:  Behavioral Health Why:  Follow-up with [facility] for outpatient services including medication mangement and outpatient therapy. Bring I.D. and current medications with you. Walk-in hours for new patients are M-F 8a-4p.  Contact information: [address] [place] [address] [phone]           Plan Of Care/Follow-up recommendations:  Activity:  Marcial Pacas has tolerated Diet:  Regular diet Other:  Follow-up with outpatient mental healthcare as prescribed  [name], MD 01/16/2017, 1:59 PM

## 2017-01-16 NOTE — Progress Notes (Signed)
Pt denies SI, HI, a/v hallucinations. Pt commits to safety for discharge. Follow up appointments, discharge medications, and treatment reviewed. Pt verbalized understanding of discharge instructions. All belongings returned to pt upon discharge. Pt discharged safely to visitors parking discharged by UBER called by brother.

## 2017-01-16 NOTE — Procedures (Signed)
ECT SERVICES Physician's Interval Evaluation & Treatment Note  Patient Identification: Benjamin [name] MRN:  [id number] Date of Evaluation:  01/16/2017 TX #: 4  MADRS: 13  MMSE: 30  P.E. Findings:  No change to physical exam heart and lungs normal vital stable  Psychiatric Interval Note:  Affect and mood upbeat no psychosis memory stable  Subjective:  Patient is a 36 y.o. male seen for evaluation for Electroconvulsive Therapy. No specific complaint  Treatment Summary:   []   Right Unilateral             [x]  Bilateral   % Energy : 1.0 ms 30%   Impedance: 1720 ohms  Seizure Energy Index: 2572 V squared  Postictal Suppression Index: 15%  Seizure Concordance Index: 61%  Medications  Pre Shock: Brevital 80 mg succinylcholine 100 mg  Post Shock: Versed 2 mg haloperidol 5 mg  Seizure Duration: 28 seconds by EMG 36 seconds by EEG   Comments: Follow-up in the community with medication and therapy no further ECT scheduled at this time   Lungs:  [x]   Clear to auscultation               []  Other:   Heart:    [x]   Regular rhythm             []  irregular rhythm    [x]   Previous H&P reviewed, patient examined and there are NO CHANGES                 []   Previous H&P reviewed, patient examined and there are changes noted.   Mordecai RasmussenJohn Crystie Yanko, MD 6/15/201811:18 AM

## 2017-01-16 NOTE — [facility] Group Notes (Signed)
[facility] LCSW Group Therapy  01/16/2017 10:29 AM  Type of Therapy:  Group Therapy  Participation Level:  Patient did not attend group. CSW invited patient to group.   Summary of Progress/Problems: Feelings around Relapse. Group members discussed the meaning of relapse and shared personal stories of relapse, how it affected them and others, and how they perceived themselves during this time. Group members were encouraged to identify triggers, warning signs and coping skills used when facing the possibility of relapse. Social supports were discussed and explored in detail. Patients also discussed facing disappointment and how that can trigger someone to relapse.  Herley Bernardini G. Garnette CzechSampson MSW, LCSWA 01/16/2017, 10:29 AM

## 2017-01-16 NOTE — Progress Notes (Signed)
[facility]

## 2017-01-21 ENCOUNTER — Telehealth: Payer: Self-pay | Admitting: *Deleted

## 2017-08-04 DIAGNOSIS — K920 Hematemesis: Secondary | ICD-10-CM

## 2017-08-04 HISTORY — DX: Hematemesis: K92.0

## 2017-08-26 ENCOUNTER — Other Ambulatory Visit: Payer: Self-pay

## 2017-08-26 ENCOUNTER — Encounter (HOSPITAL_COMMUNITY): Payer: Self-pay | Admitting: Emergency Medicine

## 2017-08-26 ENCOUNTER — Emergency Department (HOSPITAL_COMMUNITY): Payer: Self-pay

## 2017-08-26 ENCOUNTER — Inpatient Hospital Stay (HOSPITAL_COMMUNITY)
Admission: EM | Admit: 2017-08-26 | Discharge: 2017-08-28 | DRG: 392 | Disposition: A | Discharge status: Home / Self Care | Payer: Self-pay | Attending: Nephrology | Admitting: Nephrology

## 2017-08-26 ENCOUNTER — Inpatient Hospital Stay (HOSPITAL_COMMUNITY): Payer: Self-pay

## 2017-08-26 DIAGNOSIS — D72829 Elevated white blood cell count, unspecified: Secondary | ICD-10-CM | POA: Diagnosis present

## 2017-08-26 DIAGNOSIS — R58 Hemorrhage, not elsewhere classified: Secondary | ICD-10-CM

## 2017-08-26 DIAGNOSIS — F4310 Post-traumatic stress disorder, unspecified: Secondary | ICD-10-CM | POA: Diagnosis present

## 2017-08-26 DIAGNOSIS — R6510 Systemic inflammatory response syndrome (SIRS) of non-infectious origin without acute organ dysfunction: Secondary | ICD-10-CM | POA: Diagnosis present

## 2017-08-26 DIAGNOSIS — F329 Major depressive disorder, single episode, unspecified: Secondary | ICD-10-CM | POA: Diagnosis present

## 2017-08-26 DIAGNOSIS — D500 Iron deficiency anemia secondary to blood loss (chronic): Secondary | ICD-10-CM

## 2017-08-26 DIAGNOSIS — K921 Melena: Secondary | ICD-10-CM | POA: Diagnosis present

## 2017-08-26 DIAGNOSIS — L089 Local infection of the skin and subcutaneous tissue, unspecified: Secondary | ICD-10-CM | POA: Diagnosis present

## 2017-08-26 DIAGNOSIS — K920 Hematemesis: Secondary | ICD-10-CM | POA: Diagnosis present

## 2017-08-26 DIAGNOSIS — I9589 Other hypotension: Secondary | ICD-10-CM

## 2017-08-26 DIAGNOSIS — F429 Obsessive-compulsive disorder, unspecified: Secondary | ICD-10-CM | POA: Diagnosis present

## 2017-08-26 DIAGNOSIS — I10 Essential (primary) hypertension: Secondary | ICD-10-CM | POA: Diagnosis present

## 2017-08-26 DIAGNOSIS — T39395A Adverse effect of other nonsteroidal anti-inflammatory drugs [NSAID], initial encounter: Secondary | ICD-10-CM | POA: Diagnosis present

## 2017-08-26 DIAGNOSIS — E861 Hypovolemia: Secondary | ICD-10-CM | POA: Diagnosis present

## 2017-08-26 DIAGNOSIS — F1210 Cannabis abuse, uncomplicated: Secondary | ICD-10-CM | POA: Diagnosis present

## 2017-08-26 DIAGNOSIS — Z79899 Other long term (current) drug therapy: Secondary | ICD-10-CM

## 2017-08-26 DIAGNOSIS — R197 Diarrhea, unspecified: Secondary | ICD-10-CM | POA: Diagnosis present

## 2017-08-26 DIAGNOSIS — I959 Hypotension, unspecified: Secondary | ICD-10-CM | POA: Diagnosis present

## 2017-08-26 DIAGNOSIS — Z87891 Personal history of nicotine dependence: Secondary | ICD-10-CM

## 2017-08-26 DIAGNOSIS — R21 Rash and other nonspecific skin eruption: Secondary | ICD-10-CM | POA: Diagnosis present

## 2017-08-26 DIAGNOSIS — F419 Anxiety disorder, unspecified: Secondary | ICD-10-CM | POA: Diagnosis present

## 2017-08-26 DIAGNOSIS — E872 Acidosis: Secondary | ICD-10-CM | POA: Diagnosis present

## 2017-08-26 DIAGNOSIS — D62 Acute posthemorrhagic anemia: Secondary | ICD-10-CM | POA: Diagnosis present

## 2017-08-26 DIAGNOSIS — K210 Gastro-esophageal reflux disease with esophagitis: Principal | ICD-10-CM | POA: Diagnosis present

## 2017-08-26 DIAGNOSIS — K449 Diaphragmatic hernia without obstruction or gangrene: Secondary | ICD-10-CM | POA: Diagnosis present

## 2017-08-26 DIAGNOSIS — N179 Acute kidney failure, unspecified: Secondary | ICD-10-CM | POA: Diagnosis present

## 2017-08-26 HISTORY — DX: Hematemesis: K92.0

## 2017-08-26 LAB — COMPREHENSIVE METABOLIC PANEL
ALT: 21 U/L (ref 17–63)
AST: 26 U/L (ref 15–41)
Albumin: 3.5 g/dL (ref 3.5–5.0)
Alkaline Phosphatase: 29 U/L — ABNORMAL LOW (ref 38–126)
Anion gap: 14 (ref 5–15)
BILIRUBIN TOTAL: 0.8 mg/dL (ref 0.3–1.2)
BUN: 41 mg/dL — ABNORMAL HIGH (ref 6–20)
CHLORIDE: 102 mmol/L (ref 101–111)
CO2: 21 mmol/L — ABNORMAL LOW (ref 22–32)
CREATININE: 1.41 mg/dL — AB (ref 0.61–1.24)
Calcium: 8.5 mg/dL — ABNORMAL LOW (ref 8.9–10.3)
Glucose, Bld: 186 mg/dL — ABNORMAL HIGH (ref 65–99)
Potassium: 4.7 mmol/L (ref 3.5–5.1)
Sodium: 137 mmol/L (ref 135–145)
TOTAL PROTEIN: 5.7 g/dL — AB (ref 6.5–8.1)

## 2017-08-26 LAB — CBC WITH DIFFERENTIAL/PLATELET
BASOS ABS: 0 10*3/uL (ref 0.0–0.1)
BASOS PCT: 0 %
EOS ABS: 0 10*3/uL (ref 0.0–0.7)
Eosinophils Relative: 0 %
HCT: 32.4 % — ABNORMAL LOW (ref 39.0–52.0)
HEMOGLOBIN: 10.9 g/dL — AB (ref 13.0–17.0)
LYMPHS ABS: 1.3 10*3/uL (ref 0.7–4.0)
Lymphocytes Relative: 11 %
MCH: 30.2 pg (ref 26.0–34.0)
MCHC: 33.6 g/dL (ref 30.0–36.0)
MCV: 89.8 fL (ref 78.0–100.0)
Monocytes Absolute: 0.3 10*3/uL (ref 0.1–1.0)
Monocytes Relative: 2 %
NEUTROS PCT: 87 %
Neutro Abs: 10.7 10*3/uL — ABNORMAL HIGH (ref 1.7–7.7)
PLATELETS: 290 10*3/uL (ref 150–400)
RBC: 3.61 MIL/uL — AB (ref 4.22–5.81)
RDW: 13.1 % (ref 11.5–15.5)
WBC: 12.3 10*3/uL — AB (ref 4.0–10.5)

## 2017-08-26 LAB — I-STAT CHEM 8, ED
BUN: 45 mg/dL — ABNORMAL HIGH (ref 6–20)
CALCIUM ION: 1.02 mmol/L — AB (ref 1.15–1.40)
CREATININE: 1.2 mg/dL (ref 0.61–1.24)
Chloride: 101 mmol/L (ref 101–111)
GLUCOSE: 182 mg/dL — AB (ref 65–99)
HCT: 30 % — ABNORMAL LOW (ref 39.0–52.0)
Hemoglobin: 10.2 g/dL — ABNORMAL LOW (ref 13.0–17.0)
Potassium: 4.5 mmol/L (ref 3.5–5.1)
Sodium: 136 mmol/L (ref 135–145)
TCO2: 24 mmol/L (ref 22–32)

## 2017-08-26 LAB — I-STAT CG4 LACTIC ACID, ED: Lactic Acid, Venous: 2.02 mmol/L (ref 0.5–1.9)

## 2017-08-26 LAB — TYPE AND SCREEN
ABO/RH(D): A POS
ANTIBODY SCREEN: NEGATIVE

## 2017-08-26 LAB — ABO/RH: ABO/RH(D): A POS

## 2017-08-26 LAB — PROTIME-INR
INR: 1.17
PROTHROMBIN TIME: 14.8 s (ref 11.4–15.2)

## 2017-08-26 LAB — LIPASE, BLOOD: Lipase: 37 U/L (ref 11–51)

## 2017-08-26 MED ORDER — ONDANSETRON HCL 4 MG/2ML IJ SOLN
4.0000 mg | Freq: Once | INTRAMUSCULAR | Status: AC
  Administered 2017-08-26: 4 mg via INTRAVENOUS
  Filled 2017-08-26: qty 2

## 2017-08-26 MED ORDER — ONDANSETRON HCL 4 MG/2ML IJ SOLN
4.0000 mg | Freq: Three times a day (TID) | INTRAMUSCULAR | Status: DC | PRN
  Administered 2017-08-27 (×3): 4 mg via INTRAVENOUS
  Filled 2017-08-26 (×3): qty 2

## 2017-08-26 MED ORDER — PANTOPRAZOLE SODIUM 40 MG IV SOLR
40.0000 mg | Freq: Once | INTRAVENOUS | Status: AC
  Administered 2017-08-26: 40 mg via INTRAVENOUS
  Filled 2017-08-26: qty 40

## 2017-08-26 MED ORDER — DEXTROSE 5 % IV SOLN
2.0000 g | Freq: Once | INTRAVENOUS | Status: AC
  Administered 2017-08-26: 2 g via INTRAVENOUS
  Filled 2017-08-26: qty 2

## 2017-08-26 MED ORDER — SODIUM CHLORIDE 0.9 % IV SOLN
8.0000 mg/h | INTRAVENOUS | Status: DC
  Administered 2017-08-27 – 2017-08-28 (×2): 8 mg/h via INTRAVENOUS
  Filled 2017-08-26 (×5): qty 80

## 2017-08-26 MED ORDER — LORAZEPAM 2 MG/ML IJ SOLN: 0.5000 mg | Freq: Four times a day (QID) | INTRAMUSCULAR | Status: DC | PRN

## 2017-08-26 MED ORDER — PANTOPRAZOLE SODIUM 40 MG IV SOLR: 40.0000 mg | Freq: Two times a day (BID) | INTRAVENOUS | Status: DC

## 2017-08-26 MED ORDER — SODIUM CHLORIDE 0.9 % IV SOLN
INTRAVENOUS | Status: DC
  Administered 2017-08-26: 23:00:00 via INTRAVENOUS

## 2017-08-26 MED ORDER — ACETAMINOPHEN 650 MG RE SUPP: 650.0000 mg | Freq: Four times a day (QID) | RECTAL | Status: DC | PRN

## 2017-08-26 MED ORDER — MORPHINE SULFATE (PF) 4 MG/ML IV SOLN: 2.0000 mg | INTRAVENOUS | Status: DC | PRN

## 2017-08-26 MED ORDER — SODIUM CHLORIDE 0.9 % IV BOLUS (SEPSIS)
1000.0000 mL | Freq: Once | INTRAVENOUS | Status: AC
  Administered 2017-08-26: 1000 mL via INTRAVENOUS

## 2017-08-26 NOTE — H&P (Signed)
History and Physical    Benjamin [name] ZOX:[id number] DOB: Jun 13, 1981 DOA: 08/26/2017  Referring MD/NP/PA:   PCP: Patient, No Pcp Per   Patient coming from:  The patient is coming from home.  At baseline, pt is independent for most of ADL.  Chief Complaint: Hematemesis, abdominal pain and diarrhea  HPI: Benjamin [name] is a 37 y.o. male with medical history significant of depression, psychosis, PTSD, OCD, substance abuse (marijuana), who presents with hematemesis, abdominal pain and diarrhea.  Patient states that his hematemesis started at about 11:00. He has had 11 times of hematemesis with dark colored blood. He also reports abdominal pain, which is located in the central and lower abdomen, constant, 9 out of 10 in severity, nonradiating, sharp. It is not aggravated or alleviated by any known factors. He states that he has been taking ibuprofen 600 mg twice a day for nonspecific pain for more than one week. He never had endoscopy in the past. Patient states that he has been having diarrhea in the past 2 days. He has had 4 watery diarrhea today. Of note, patient reports that he has been taking amoxicillin for skin rash/infection since beginning of November, which is prescribed by his PCP. He is not sure about the diagnosis of his skin rashes. Currently he has some scattered rashes on abdominal wall. He states that his skin rash has significantly improved with antibiotic treatment. Patient does not have chest pain, shortness breath, cough, symptoms of UTI or unilateral weakness. Patient was initially hypotensive with blood pressure 87/57, which improved to 106/66 after 2 L normal saline bolus in ED. Per EDP, pt's brother called EDP and expressed concerns that pt's fiance may poison pt, but when we asked pt about this possibility in the abscess of his fiance, patient strongly denied this possibility.  ED Course: pt was found to have hemoglobin 10.2 which was a 15.1 on 01/10/17, WBC 12.3, lactic acid  2.02, INR 1.17, acute renal injury with a creatinine 1.20, temperature 99.1, tachycardia, tachypnea, oxygen saturation 99% on room air, negative chest x-ray. CT abdomen/pelvis is negative for acute abnormalities. Patient is admitted to telemetry bed as inpatient.  Review of Systems:   General: no fevers, chills, no body weight gain, has poor appetite, has fatigue HEENT: no blurry vision, hearing changes or sore throat Respiratory: no dyspnea, coughing, wheezing CV: no chest pain, no palpitations GI: has nausea, vomiting, abdominal pain, diarrhea, no constipation GU: no dysuria, burning on urination, increased urinary frequency, hematuria  Ext: no leg edema Neuro: no unilateral weakness, numbness, or tingling, no vision change or hearing loss Skin: has rashes. MSK: No muscle spasm, no deformity, no limitation of range of movement in spin Heme: No easy bruising.  Travel history: No recent long distant travel.  Allergy: No Known Allergies  Past Medical History:  Diagnosis Date  . Depression   . Herniated disc    x3  . Hypertension   . Mental disorder     Past Surgical History:  Procedure Laterality Date  . SHOULDER SURGERY      Social History:  reports that he quit smoking about 5 years ago. His smoking use included cigarettes. He smoked 1.00 pack per day. he has never used smokeless tobacco. He reports that he does not drink alcohol or use drugs.  Family History:  Family History  Problem Relation Age of Onset  . Seizures Mother   . Stroke Mother   . Diabetes Mellitus II Mother      Prior to Admission  medications   Medication Sig Start Date End Date Taking? Authorizing Provider  ARIPiprazole (ABILIFY) 9.75 MG/1.3ML injection Inject 15 mg into the muscle every 30 (thirty) days.   Yes [provider]  fluvoxaMINE (LUVOX) 100 MG tablet Take 1 tablet (100 mg total) by mouth 2 (two) times daily. 01/16/17  Yes Clapacs, Jackquline Denmark, MD  ARIPiprazole (ABILIFY) 15 MG tablet Take  1 tablet (15 mg total) by mouth daily. Patient not taking: Reported on 08/26/2017 01/17/17   Audery Amel, MD    Physical Exam: Vitals:   08/27/17 0037 08/27/17 0038 08/27/17 0111 08/27/17 0559  BP: 112/68 112/68 116/70 113/74  Pulse: (!) 103 100 92 86  Resp: (!) 26 (!) 166 20 20  Temp:   99 F (37.2 C) 98.5 F (36.9 C)  TempSrc:   Oral Oral  SpO2: 100% 97% 99% 96%  Weight:   (!) 188.1 kg (414 lb 11 oz)   Height:   5\' 10"  (1.778 m)    General: Not in acute distress HEENT:       Eyes: PERRL, EOMI, no scleral icterus.       ENT: No discharge from the ears and nose, no pharynx injection, no tonsillar enlargement.        Neck: No JVD, no bruit, no mass felt. Heme: No neck lymph node enlargement. Cardiac: S1/S2, RRR, No murmurs, No gallops or rubs. Respiratory:  No rales, wheezing, rhonchi or rubs. GI: Soft, nondistended, has tenderness in central and lower abdomen, no rebound pain, no organomegaly, BS present. GU: No hematuria Ext: No pitting leg edema bilaterally. 2+DP/PT pulse bilaterally. Musculoskeletal: No joint deformities, No joint redness or warmth, no limitation of ROM in spin. Skin: has scattered erythematous rashes the abdominal wall.  Neuro: Alert, oriented X3, cranial nerves II-XII grossly intact, moves all extremities normally.   Psych: Patient is not psychotic, no suicidal or hemocidal ideation.  Labs on Admission: I have personally reviewed following labs and imaging studies  CBC: Recent Labs  Lab 08/26/17 2131 08/26/17 2150 08/26/17 2345  WBC 12.3*  --  8.1  NEUTROABS 10.7*  --   --   HGB 10.9* 10.2* 9.7*  HCT 32.4* 30.0* 27.9*  MCV 89.8  --  88.9  PLT 290  --  201   Basic Metabolic Panel: Recent Labs  Lab 08/26/17 2131 08/26/17 2150  NA 137 136  K 4.7 4.5  CL 102 101  CO2 21*  --   GLUCOSE 186* 182*  BUN 41* 45*  CREATININE 1.41* 1.20  CALCIUM 8.5*  --    GFR: Estimated Creatinine Clearance: 140.5 mL/min (by C-G formula based on SCr of  1.2 mg/dL). Liver Function Tests: Recent Labs  Lab 08/26/17 2131  AST 26  ALT 21  ALKPHOS 29*  BILITOT 0.8  PROT 5.7*  ALBUMIN 3.5   Recent Labs  Lab 08/26/17 2131  LIPASE 37   No results for input(s): AMMONIA in the last 168 hours. Coagulation Profile: Recent Labs  Lab 08/26/17 2131  INR 1.17   Cardiac Enzymes: No results for input(s): CKTOTAL, CKMB, CKMBINDEX, TROPONINI in the last 168 hours. BNP (last 3 results) No results for input(s): PROBNP in the last 8760 hours. HbA1C: No results for input(s): HGBA1C in the last 72 hours. CBG: No results for input(s): GLUCAP in the last 168 hours. Lipid Profile: No results for input(s): CHOL, HDL, LDLCALC, TRIG, CHOLHDL, LDLDIRECT in the last 72 hours. Thyroid Function Tests: No results for input(s): TSH, T4TOTAL, FREET4, T3FREE,  THYROIDAB in the last 72 hours. Anemia Panel: No results for input(s): VITAMINB12, FOLATE, FERRITIN, TIBC, IRON, RETICCTPCT in the last 72 hours. Urine analysis:    Component Value Date/Time   COLORURINE AMBER (A) 07/16/2016 0745   APPEARANCEUR HAZY (A) 07/16/2016 0745   LABSPEC 1.025 07/16/2016 0745   PHURINE 6.0 07/16/2016 0745   GLUCOSEU NEGATIVE 07/16/2016 0745   HGBUR NEGATIVE 07/16/2016 0745   BILIRUBINUR NEGATIVE 07/16/2016 0745   KETONESUR NEGATIVE 07/16/2016 0745   PROTEINUR 30 (A) 07/16/2016 0745   UROBILINOGEN 0.2 09/07/2012 2130   NITRITE NEGATIVE 07/16/2016 0745   LEUKOCYTESUR NEGATIVE 07/16/2016 0745   Sepsis Labs: @LABRCNTIP (procalcitonin:4,lacticidven:4) )No results found for this or any previous visit (from the past 240 hour(s)).   Radiological Exams on Admission: Ct Abdomen Pelvis Wo Contrast  Result Date: 08/27/2017 CLINICAL DATA:  Lower abdominal pain EXAM: CT ABDOMEN AND PELVIS WITHOUT CONTRAST TECHNIQUE: Multidetector CT imaging of the abdomen and pelvis was performed following the standard protocol without IV contrast. COMPARISON:  08/26/2017 FINDINGS: Lower chest:  No acute abnormality. Hepatobiliary: No focal liver abnormality is seen. No gallstones, gallbladder wall thickening, or biliary dilatation. Pancreas: Unremarkable. No pancreatic ductal dilatation or surrounding inflammatory changes. Spleen: Normal in size without focal abnormality. Adrenals/Urinary Tract: Adrenal glands are unremarkable. Kidneys are normal, without renal calculi, focal lesion, or hydronephrosis. Bladder is unremarkable. Stomach/Bowel: Stomach is within normal limits. Appendix appears normal. No evidence of bowel wall thickening, distention, or inflammatory changes. Vascular/Lymphatic: No significant vascular findings are present. No enlarged abdominal or pelvic lymph nodes. Reproductive: Prostate is unremarkable. Other: No abdominal wall hernia or abnormality. No abdominopelvic ascites. Small fatty periumbilical hernia. Musculoskeletal: Degenerative changes. No acute or suspicious bone lesion. IMPRESSION: No CT evidence for acute intra-abdominal pelvic abnormality. Negative for hydronephrosis or ureteral stone. Electronically Signed   By: Jasmine PangKim  Fujinaga M.D.   On: 08/27/2017 00:20   Dg Abdomen 1 View  Result Date: 08/26/2017 CLINICAL DATA:  Hematemesis EXAM: ABDOMEN - 1 VIEW COMPARISON:  None. FINDINGS: Visible lung bases are clear. No definitive free air beneath the diaphragm. Visible gas pattern is nonobstructed IMPRESSION: Incomplete inclusion of the lower abdomen and pelvis. Visible gas pattern is nonobstructed Electronically Signed   By: Jasmine PangKim  Fujinaga M.D.   On: 08/26/2017 22:34   Dg Chest Port 1 View  Result Date: 08/26/2017 CLINICAL DATA:  Hematemesis, nausea and vomiting times 10 or more today. EXAM: PORTABLE CHEST 1 VIEW COMPARISON:  09/05/2012 FINDINGS: The heart size and mediastinal contours are within normal limits. Both lungs are clear. The right costophrenic angle is excluded on this frontal view. No significant pleural effusion or pneumothorax. The visualized skeletal  structures are unremarkable. IMPRESSION: No active disease. Electronically Signed   By: Tollie Ethavid  Kwon M.D.   On: 08/26/2017 22:05     EKG: Independently reviewed.  Sinus rhythm, QTC 44, tachycardia, no ischemic change.  Assessment/Plan Principal Problem:   Hematemesis Active Problems:   MDD (major depressive disorder)   Hypotension   AKI (acute kidney injury) (HCC)   Diarrhea   Rash   SIRS (systemic inflammatory response syndrome) (HCC)   Hematemesis and hypotension: Differential diagnoses include gastritis versus gastric ulcer given recent use of ibuprofen. Hemoglobin dropped from 15.1 on 01/10/17-10.2, then 9.7. Patient was initially hypotensive with blood pressure 87/57. Patient responded to IV fluid resuscitation. After 2 L normal saline, blood pressure stabilized at SBP>100.  EDP attempted to call GI twice without success. CT abdomen/pelvis is negative for acute intra-abdominal abnormalities.  - will  admit to tele bed as inpt - NPO for possible EGD - IVF: 2L NS bolus, then at 125 mL/hr - Start IV pantoprazole gtt - Zofran IV for nausea - Avoid NSAIDs and SQ heparin - Maintain IV access (2 large bore IVs if possible). - Monitor closely and follow q6h cbc, transfuse as necessary, if Hgb<7.0 - LaB: INR, PTT and type screen - please call GI in AM  Diarrhea: Patient has been using amoxicillin since the kidney of November, at risk of developing C. difficile colitis. -check C diff PCR -IV fluid as above  Depression: Stable, no suicidal or homicidal ideations. -Continue home medications: Luvax and Abilify  AKI: Likely due to prerenal secondary to dehydration and continuation of NSAIDs. ATN is also possible given episode of hypotension - IVF as above - Follow up renal function by BMP - Hold ibuprofen  Rash: Unclear diagnosis. Patient has been taking amoxicillin since the beginning of November. Symptoms have improved significantly per patient. -will hold amoxicillin due to  diarrhea  SIRS (systemic inflammatory response syndrome) [facility]): Patient meets criteria for SIRS with leukocytosis, tachycardia, tachypnea and hypotension. Patient's tachycardia and hypotension can be explained by the blood loss. Lactic acid 2.02. Need to r/o c diff. -will get Procalcitonin and trend lactic acid levels per sepsis protocol. -IVF: 2L of NS bolus in ED, followed by 125 cc/h  -f/u blood culture and C diff pcr   DVT ppx: SCD Code Status: Full code Family Communication:  Yes, patient's fianc at bed side Disposition Plan:  Anticipate discharge back to previous home environment Consults called:  None Admission status:  Inpatient/tele      Date of Service 08/27/2017    Lorretta Harp [facility] Pager [phone]  If 7PM-7AM, please contact night-coverage [email or web address] Password [facility] 08/27/2017, 6:06 AM
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
## 2017-08-26 NOTE — ED Notes (Signed)
ED Provider at bedside. 
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
## 2017-08-26 NOTE — ED Triage Notes (Signed)
Per EMS: pt from home with c/o blood in emesis.  Pt reports vomiting blood (10 times or more) since 11am today.  Upon standing pt reports dizziness and increased nausea.  PTA vitals:  BP 60/30, HR 124, CBG 242.

## 2017-08-27 ENCOUNTER — Other Ambulatory Visit: Payer: Self-pay

## 2017-08-27 ENCOUNTER — Inpatient Hospital Stay (HOSPITAL_COMMUNITY): Payer: Self-pay | Admitting: Certified Registered Nurse Anesthetist

## 2017-08-27 ENCOUNTER — Encounter (HOSPITAL_COMMUNITY)
Admission: EM | Disposition: A | Discharge status: Home / Self Care | Payer: Self-pay | Source: Home / Self Care | Attending: Nephrology

## 2017-08-27 ENCOUNTER — Encounter (HOSPITAL_COMMUNITY): Payer: Self-pay | Admitting: Radiology

## 2017-08-27 HISTORY — PX: ESOPHAGOGASTRODUODENOSCOPY: SHX5428

## 2017-08-27 LAB — RAPID URINE DRUG SCREEN, HOSP PERFORMED
Amphetamines: NOT DETECTED
Barbiturates: NOT DETECTED
Benzodiazepines: NOT DETECTED
COCAINE: NOT DETECTED
OPIATES: NOT DETECTED
TETRAHYDROCANNABINOL: POSITIVE — AB

## 2017-08-27 LAB — CBC
HCT: 24.4 % — ABNORMAL LOW (ref 39.0–52.0)
HEMATOCRIT: 25.8 % — AB (ref 39.0–52.0)
HEMATOCRIT: 27.9 % — AB (ref 39.0–52.0)
HEMOGLOBIN: 8.4 g/dL — AB (ref 13.0–17.0)
HEMOGLOBIN: 9.7 g/dL — AB (ref 13.0–17.0)
Hemoglobin: 8.8 g/dL — ABNORMAL LOW (ref 13.0–17.0)
MCH: 30.5 pg (ref 26.0–34.0)
MCH: 30.6 pg (ref 26.0–34.0)
MCH: 30.9 pg (ref 26.0–34.0)
MCHC: 34.1 g/dL (ref 30.0–36.0)
MCHC: 34.4 g/dL (ref 30.0–36.0)
MCHC: 34.8 g/dL (ref 30.0–36.0)
MCV: 88.7 fL (ref 78.0–100.0)
MCV: 88.9 fL (ref 78.0–100.0)
MCV: 89.6 fL (ref 78.0–100.0)
PLATELETS: 171 10*3/uL (ref 150–400)
Platelets: 166 10*3/uL (ref 150–400)
Platelets: 201 10*3/uL (ref 150–400)
RBC: 2.75 MIL/uL — ABNORMAL LOW (ref 4.22–5.81)
RBC: 2.88 MIL/uL — ABNORMAL LOW (ref 4.22–5.81)
RBC: 3.14 MIL/uL — AB (ref 4.22–5.81)
RDW: 13.2 % (ref 11.5–15.5)
RDW: 13.3 % (ref 11.5–15.5)
RDW: 13.4 % (ref 11.5–15.5)
WBC: 6.1 10*3/uL (ref 4.0–10.5)
WBC: 6.9 10*3/uL (ref 4.0–10.5)
WBC: 8.1 10*3/uL (ref 4.0–10.5)

## 2017-08-27 LAB — IRON AND TIBC
IRON: 242 ug/dL — AB (ref 45–182)
Saturation Ratios: 83 % — ABNORMAL HIGH (ref 17.9–39.5)
TIBC: 291 ug/dL (ref 250–450)
UIBC: 49 ug/dL

## 2017-08-27 LAB — LACTIC ACID, PLASMA
LACTIC ACID, VENOUS: 0.8 mmol/L (ref 0.5–1.9)
LACTIC ACID, VENOUS: 1.3 mmol/L (ref 0.5–1.9)

## 2017-08-27 LAB — BASIC METABOLIC PANEL
Anion gap: 6 (ref 5–15)
BUN: 25 mg/dL — AB (ref 6–20)
CHLORIDE: 107 mmol/L (ref 101–111)
CO2: 25 mmol/L (ref 22–32)
CREATININE: 1.06 mg/dL (ref 0.61–1.24)
Calcium: 7.8 mg/dL — ABNORMAL LOW (ref 8.9–10.3)
GFR calc Af Amer: >60 mL/min (ref >60)
GFR calc non Af Amer: >60 mL/min (ref >60)
GLUCOSE: 101 mg/dL — AB (ref 65–99)
POTASSIUM: 3.7 mmol/L (ref 3.5–5.1)
Sodium: 138 mmol/L (ref 135–145)

## 2017-08-27 LAB — PROCALCITONIN

## 2017-08-27 LAB — APTT: aPTT: 28 seconds (ref 24–36)

## 2017-08-27 LAB — GLUCOSE, CAPILLARY: Glucose-Capillary: 95 mg/dL (ref 65–99)

## 2017-08-27 LAB — HIV ANTIBODY (ROUTINE TESTING W REFLEX): HIV Screen 4th Generation wRfx: NONREACTIVE

## 2017-08-27 LAB — FERRITIN: Ferritin: 55 ng/mL (ref 24–336)

## 2017-08-27 SURGERY — ESOPHAGOGASTRODUODENOSCOPY (EGD) WITH PROPOFOL
Anesthesia: Monitor Anesthesia Care

## 2017-08-27 SURGERY — EGD (ESOPHAGOGASTRODUODENOSCOPY)
Anesthesia: Monitor Anesthesia Care | Laterality: Left

## 2017-08-27 MED ORDER — SODIUM CHLORIDE 0.9 % IV SOLN: INTRAVENOUS | Status: DC

## 2017-08-27 MED ORDER — ARIPIPRAZOLE 9.75 MG/1.3ML IM SOLN: 15.0000 mg | INTRAMUSCULAR | Status: DC

## 2017-08-27 MED ORDER — MIDAZOLAM HCL 10 MG/2ML IJ SOLN
INTRAMUSCULAR | Status: DC | PRN
  Administered 2017-08-27 (×2): 2 mg via INTRAVENOUS
  Administered 2017-08-27: 1 mg via INTRAVENOUS

## 2017-08-27 MED ORDER — BUTAMBEN-TETRACAINE-BENZOCAINE 2-2-14 % EX AERO
INHALATION_SPRAY | CUTANEOUS | Status: DC | PRN
  Administered 2017-08-27: 2 via TOPICAL

## 2017-08-27 MED ORDER — DIPHENHYDRAMINE HCL 50 MG/ML IJ SOLN
INTRAMUSCULAR | Status: AC
  Filled 2017-08-27: qty 1

## 2017-08-27 MED ORDER — FLUVOXAMINE MALEATE 100 MG PO TABS
100.0000 mg | ORAL_TABLET | Freq: Two times a day (BID) | ORAL | Status: DC
  Administered 2017-08-27 – 2017-08-28 (×4): 100 mg via ORAL
  Filled 2017-08-27 (×4): qty 1

## 2017-08-27 MED ORDER — FENTANYL CITRATE (PF) 100 MCG/2ML IJ SOLN
INTRAMUSCULAR | Status: DC | PRN
  Administered 2017-08-27 (×2): 25 ug via INTRAVENOUS

## 2017-08-27 MED ORDER — FENTANYL CITRATE (PF) 100 MCG/2ML IJ SOLN
INTRAMUSCULAR | Status: AC
  Filled 2017-08-27: qty 2

## 2017-08-27 MED ORDER — POTASSIUM CHLORIDE IN NACL 40-0.9 MEQ/L-% IV SOLN
INTRAVENOUS | Status: DC
  Administered 2017-08-27: 75 mL/h via INTRAVENOUS
  Filled 2017-08-27 (×3): qty 1000

## 2017-08-27 MED ORDER — MIDAZOLAM HCL 5 MG/ML IJ SOLN
INTRAMUSCULAR | Status: AC
  Filled 2017-08-27: qty 2

## 2017-08-27 MED ORDER — LACTATED RINGERS IV SOLN
INTRAVENOUS | Status: DC
  Administered 2017-08-27: 1000 mL via INTRAVENOUS

## 2017-08-27 NOTE — Progress Notes (Addendum)
PROGRESS NOTE    Benjamin [name]  XBJ:[id number] DOB: Dec 11, 1980 DOA: 08/26/2017 PCP: Knox RoyaltyJones, Enrico, MD   Brief Narrative: 37 y.o. male with medical history significant of depression, psychosis, PTSD, OCD, substance abuse (marijuana), who presents with hematemesis, abdominal pain.  In the ER patient was found to have hemoglobin 10.2 dropped from 15.1 in June 2018, leukocytosis, mild lactic acidosis, CT scan of abdomen pelvis negative for acute abnormalities.  Admitted for further evaluation.  Assessment & Plan:   #Hematemesis and hypotension likely contributed by NSAID use.  Patient reported taking 800 mg of ibuprofen twice a day for about a week.  The patient is n.p.o. and plan for GI procedure today.  Continue Protonix.  Education provided to the patient to avoid any NSAIDs.  Blood pressure improved.  Continue IV fluid, supportive care.  -Patient denies diarrhea.  Enteric precautions at home.  Abdominal exam benign. -Chest x-ray, CT scan unremarkable.  #Acute blood loss anemia: GI evaluation ongoing.  Checking iron stores.  Monitor CBC.  #Anxiety depression: Stable.  Continue home medication.  #Acute kidney injury: Due to NSAID and hypotension.  Serum creatinine level improving.  Continue to monitor.  No urinary complaints.  #SIRS criteria: Mild elevation in lactic acid due to vomiting.  Continue IV fluids.  Follow-up culture results.  Repeat lab improved.  DVT prophylaxis: SCD. Code Status: Full code Family Communication: No family at bedside Disposition Plan: Likely discharge home in 1-2 days    Consultants:   GI  Procedures: None Antimicrobials: None  Subjective: Seen and examined at bedside.  Reported nausea and decreasing hematemesis.  No abdominal pain.  Denies any bowel movement since yesterday morning.  No chest pain or shortness of breath.  Objective: Vitals:   08/27/17 0111 08/27/17 0559 08/27/17 0745 08/27/17 1153  BP: 116/70 113/74 123/60 (!) 116/57  Pulse:  92 86 94 72  Resp: 20 20 18 18   Temp: 99 F (37.2 C) 98.5 F (36.9 C)  98.1 F (36.7 C)  TempSrc: Oral Oral  Oral  SpO2: 99% 96% 100% 98%  Weight: (!) 188.1 kg (414 lb 11 oz)     Height: 5\' 10"  (1.778 m)       Intake/Output Summary (Last 24 hours) at 08/27/2017 1251 Last data filed at 08/27/2017 1153 Gross per 24 hour  Intake 3424.58 ml  Output 2000 ml  Net 1424.58 ml   Filed Weights   08/26/17 2159 08/27/17 0111  Weight: 88.5 kg (195 lb) (!) 188.1 kg (414 lb 11 oz)    Examination:  General exam: Appears calm and comfortable  Respiratory system: Clear to auscultation. Respiratory effort normal. No wheezing or crackle Cardiovascular system: S1 & S2 heard, RRR.  No pedal edema. Gastrointestinal system: Abdomen is nondistended, soft and nontender. Normal bowel sounds heard. Central nervous system: Alert and oriented. No focal neurological deficits. Extremities: Symmetric 5 x 5 power. Skin: No rashes, lesions or ulcers Psychiatry: Judgement and insight appear normal. Mood & affect appropriate.     Data Reviewed: I have personally reviewed following labs and imaging studies  CBC: Recent Labs  Lab 08/26/17 2131 08/26/17 2150 08/26/17 2345 08/27/17 0546 08/27/17 1105  WBC 12.3*  --  8.1 6.9 6.1  NEUTROABS 10.7*  --   --   --   --   HGB 10.9* 10.2* 9.7* 8.8* 8.4*  HCT 32.4* 30.0* 27.9* 25.8* 24.4*  MCV 89.8  --  88.9 89.6 88.7  PLT 290  --  201 171 166   Basic Metabolic  Panel: Recent Labs  Lab 08/26/17 2131 08/26/17 2150 08/27/17 0546  NA 137 136 138  K 4.7 4.5 3.7  CL 102 101 107  CO2 21*  --  25  GLUCOSE 186* 182* 101*  BUN 41* 45* 25*  CREATININE 1.41* 1.20 1.06  CALCIUM 8.5*  --  7.8*   GFR: Estimated Creatinine Clearance: 159 mL/min (by C-G formula based on SCr of 1.06 mg/dL). Liver Function Tests: Recent Labs  Lab 08/26/17 2131  AST 26  ALT 21  ALKPHOS 29*  BILITOT 0.8  PROT 5.7*  ALBUMIN 3.5   Recent Labs  Lab 08/26/17 2131  LIPASE 37     No results for input(s): AMMONIA in the last 168 hours. Coagulation Profile: Recent Labs  Lab 08/26/17 2131  INR 1.17   Cardiac Enzymes: No results for input(s): CKTOTAL, CKMB, CKMBINDEX, TROPONINI in the last 168 hours. BNP (last 3 results) No results for input(s): PROBNP in the last 8760 hours. HbA1C: No results for input(s): HGBA1C in the last 72 hours. CBG: No results for input(s): GLUCAP in the last 168 hours. Lipid Profile: No results for input(s): CHOL, HDL, LDLCALC, TRIG, CHOLHDL, LDLDIRECT in the last 72 hours. Thyroid Function Tests: No results for input(s): TSH, T4TOTAL, FREET4, T3FREE, THYROIDAB in the last 72 hours. Anemia Panel: No results for input(s): VITAMINB12, FOLATE, FERRITIN, TIBC, IRON, RETICCTPCT in the last 72 hours. Sepsis Labs: Recent Labs  Lab 08/26/17 2151 08/26/17 2345 08/27/17 0230  PROCALCITON  --  <0.10  --   LATICACIDVEN 2.02* 1.3 0.8    No results found for this or any previous visit (from the past 240 hour(s)).       Radiology Studies: Ct Abdomen Pelvis Wo Contrast  Result Date: 08/27/2017 CLINICAL DATA:  Lower abdominal pain EXAM: CT ABDOMEN AND PELVIS WITHOUT CONTRAST TECHNIQUE: Multidetector CT imaging of the abdomen and pelvis was performed following the standard protocol without IV contrast. COMPARISON:  08/26/2017 FINDINGS: Lower chest: No acute abnormality. Hepatobiliary: No focal liver abnormality is seen. No gallstones, gallbladder wall thickening, or biliary dilatation. Pancreas: Unremarkable. No pancreatic ductal dilatation or surrounding inflammatory changes. Spleen: Normal in size without focal abnormality. Adrenals/Urinary Tract: Adrenal glands are unremarkable. Kidneys are normal, without renal calculi, focal lesion, or hydronephrosis. Bladder is unremarkable. Stomach/Bowel: Stomach is within normal limits. Appendix appears normal. No evidence of bowel wall thickening, distention, or inflammatory changes.  Vascular/Lymphatic: No significant vascular findings are present. No enlarged abdominal or pelvic lymph nodes. Reproductive: Prostate is unremarkable. Other: No abdominal wall hernia or abnormality. No abdominopelvic ascites. Small fatty periumbilical hernia. Musculoskeletal: Degenerative changes. No acute or suspicious bone lesion. IMPRESSION: No CT evidence for acute intra-abdominal pelvic abnormality. Negative for hydronephrosis or ureteral stone. Electronically Signed   By: Jasmine Pang M.D.   On: 08/27/2017 00:20   Dg Abdomen 1 View  Result Date: 08/26/2017 CLINICAL DATA:  Hematemesis EXAM: ABDOMEN - 1 VIEW COMPARISON:  None. FINDINGS: Visible lung bases are clear. No definitive free air beneath the diaphragm. Visible gas pattern is nonobstructed IMPRESSION: Incomplete inclusion of the lower abdomen and pelvis. Visible gas pattern is nonobstructed Electronically Signed   By: Jasmine Pang M.D.   On: 08/26/2017 22:34   Dg Chest Port 1 View  Result Date: 08/26/2017 CLINICAL DATA:  Hematemesis, nausea and vomiting times 10 or more today. EXAM: PORTABLE CHEST 1 VIEW COMPARISON:  09/05/2012 FINDINGS: The heart size and mediastinal contours are within normal limits. Both lungs are clear. The right costophrenic angle is excluded  on this frontal view. No significant pleural effusion or pneumothorax. The visualized skeletal structures are unremarkable. IMPRESSION: No active disease. Electronically Signed   By: Tollie Eth M.D.   On: 08/26/2017 22:05        Scheduled Meds: . fluvoxaMINE  100 mg Oral BID  . [START ON 08/30/2017] pantoprazole  40 mg Intravenous Q12H   Continuous Infusions: . sodium chloride 125 mL/hr at 08/26/17 2257  . pantoprozole (PROTONIX) infusion 8 mg/hr (08/27/17 0031)     LOS: 1 day    Zynia Wojtowicz Jaynie Collins, MD [facility] Pager [phone]  If 7PM-7AM, please contact night-coverage [email or web address] Password [facility] 08/27/2017, 12:51 PM

## 2017-08-27 NOTE — Progress Notes (Signed)
Spoke with hospitalist regarding d/c this date and MD states he will keep pt overnight and plan for d/c in the AM.

## 2017-08-27 NOTE — Plan of Care (Signed)
Pt. Independent and ambulatory in his room.

## 2017-08-27 NOTE — Progress Notes (Signed)
Pt. With Enteric precautions ordered. Pt. Stated he has not had any loose or watery stools. Stated he did have 4 bowel movements at home that were normal stools. Pts. Body temp. WNL. Pt. Does have pain in his lower abdomen below umbilicus. RN unable to collect stool sample to send as pt. Stated he has not had a bowel movement. On coming RN made aware.

## 2017-08-27 NOTE — Op Note (Signed)
[facility] Patient Name: Benjamin [name] Procedure Date : 08/27/2017 MRN: [id number] Attending MD: Jeani Hawking , MD Date of Birth: [birth year] CSN: [id number] Age: 37 Admit Type: Inpatient Procedure:                Upper GI endoscopy Indications:              Hematemesis Providers:                Jeani Hawking, MD, Bonney Leitz, Janie Billups,                            Technician Referring MD:              Medicines:                Fentanyl 50 micrograms IV, Midazolam 4 mg IV Complications:            No immediate complications. Estimated Blood Loss:     Estimated blood loss: none. Procedure:                Pre-Anesthesia Assessment:                           - Prior to the procedure, a History and Physical                            was performed, and patient medications and                            allergies were reviewed. The patient's tolerance of                            previous anesthesia was also reviewed. The risks                            and benefits of the procedure and the sedation                            options and risks were discussed with the patient.                            All questions were answered, and informed consent                            was obtained. Prior Anticoagulants: The patient has                            taken no previous anticoagulant or antiplatelet                            agents. ASA Grade Assessment: III - A patient with                            severe systemic disease. After reviewing the risks  and benefits, the patient was deemed in                            satisfactory condition to undergo the procedure.                           - Sedation was administered by an anesthesia                            professional. Deep sedation was attained.                           After obtaining informed consent, the endoscope was                            passed under direct vision.  Throughout the                            procedure, the patient's blood pressure, pulse, and                            oxygen saturations were monitored continuously. The                            EG-[id number] ([id number] scope was introduced through the                            mouth, and advanced to the second part of duodenum.                            The upper GI endoscopy was accomplished without                            difficulty. The patient tolerated the procedure                            well. Scope In: Scope Out: Findings:      LA Grade D (one or more mucosal breaks involving at least 75% of       esophageal circumference) esophagitis with no bleeding was found.      A 4 cm hiatal hernia was present.      The stomach was normal.      The examined duodenum was normal.      In the distal esophagus an LA Grade D esophagitis was identified. There       was one site that was noted to have an old overlying small clot and this       was most likely the source of significant bleeding. Impression:               - LA Grade D reflux esophagitis.                           - 4 cm hiatal hernia.                           -  Normal stomach.                           - Normal examined duodenum.                           - No specimens collected. Moderate Sedation:      Moderate (conscious) sedation was administered by the endoscopy nurse       and supervised by the endoscopist. The following parameters were       monitored: oxygen saturation, heart rate, blood pressure, and response       to care. Recommendation:           - Return patient to hospital ward for ongoing care.                           - Resume regular diet.                           - Continue present medications.                           - PPI BID x 1 month and then QD indefintely.                           Patient can follow up with PCP. Procedure Code(s):        --- Professional ---                            [phone], Esophagogastroduodenoscopy, flexible,                            transoral; diagnostic, including collection of                            specimen(s) by brushing or washing, when performed                            (separate procedure) Diagnosis Code(s):        --- Professional ---                           K21.0, Gastro-esophageal reflux disease with                            esophagitis                           K44.9, Diaphragmatic hernia without obstruction or                            gangrene                           K92.0, Hematemesis CPT copyright 2016 [employer]. All rights reserved. The codes documented in this report are preliminary and upon coder review may  be revised to meet current compliance requirements. Jeani Hawking, MD Jeani Hawking, MD 08/27/2017 3:35:53 PM This  report has been signed electronically. Number of Addenda: 0

## 2017-08-27 NOTE — Progress Notes (Signed)
Patient back from EGD and requesting to eat. Sandwich provided with milk per pt request. Pt tolerated food without any reports of nausea or emesis.   Pt alert and oriented, offers no c/o discomfort.

## 2017-08-27 NOTE — Progress Notes (Signed)
No emesis noted this shift. No visible sign of bleeding at this time.

## 2017-08-27 NOTE — H&P (Signed)
Reason for Consult: Hematemesis Referring Physician: [facility]  [name] HPI: This is a 37 year old male with complaints of hematemesis.  His symptoms started two days ago, but it markedly worsened yesterday at work.  He started to have dark "purplish" vomiting and the episodes of vomiting increased in frequency and severity.  When he presented to the ER he was noted to have an HGB of 10.9 g/dL and previously on 09/04/3084 it was at 15.1 g/dL.  He denies any prior history of an upper GI bleed, but he admits to using Advil for the past three weeks.  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  Also, yesterday was the first time that he noticed some issues with dark stools.  Past Medical History:  Diagnosis Date  . Depression   . Hematemesis 08/2017  . Herniated disc    x3  . Hypertension   . Mental disorder     Past Surgical History:  Procedure Laterality Date  . SHOULDER SURGERY    . WISDOM TOOTH EXTRACTION      Family History  Problem Relation Age of Onset  . Seizures Mother   . Stroke Mother   . Diabetes Mellitus II Mother     Social History:  reports that he quit smoking about 5 years ago. His smoking use included cigarettes. He smoked 1.00 pack per day. he has never used smokeless tobacco. He reports that he uses drugs. Drug: Marijuana. He reports that he does not drink alcohol.  Allergies: No Known Allergies  Medications:  Scheduled: . [MAR Hold] fluvoxaMINE  100 mg Oral BID  . [MAR Hold] pantoprazole  40 mg Intravenous Q12H   Continuous: . sodium chloride    . 0.9 % NaCl with KCl 40 mEq / L    . lactated ringers 1,000 mL (08/27/17 1334)  . pantoprozole (PROTONIX) infusion 8 mg/hr (08/27/17 0031)    Results for orders placed or performed during the hospital encounter of 08/26/17 (from the past 24 hour(s))  Comprehensive metabolic panel     Status: Abnormal   Collection Time: 08/26/17  9:31 PM  Result Value Ref  Range   Sodium 137 135 - 145 mmol/L   Potassium 4.7 3.5 - 5.1 mmol/L   Chloride 102 101 - 111 mmol/L   CO2 21 (L) 22 - 32 mmol/L   Glucose, Bld 186 (H) 65 - 99 mg/dL   BUN 41 (H) 6 - 20 mg/dL   Creatinine, Ser 5.78 (H) 0.61 - 1.24 mg/dL   Calcium 8.5 (L) 8.9 - 10.3 mg/dL   Total Protein 5.7 (L) 6.5 - 8.1 g/dL   Albumin 3.5 3.5 - 5.0 g/dL   AST 26 15 - 41 U/L   ALT 21 17 - 63 U/L   Alkaline Phosphatase 29 (L) 38 - 126 U/L   Total Bilirubin 0.8 0.3 - 1.2 mg/dL   GFR calc non Af Amer >60 >60 mL/min   GFR calc Af Amer >60 >60 mL/min   Anion gap 14 5 - 15  CBC WITH DIFFERENTIAL     Status: Abnormal   Collection Time: 08/26/17  9:31 PM  Result Value Ref Range   WBC 12.3 (H) 4.0 - 10.5 K/uL   RBC 3.61 (L) 4.22 - 5.81 MIL/uL   Hemoglobin 10.9 (L) 13.0 - 17.0 g/dL   HCT 46.9 (L) 62.9 - 52.8 %   MCV 89.8 78.0 - 100.0 fL   MCH 30.2 26.0 - 34.0  pg   MCHC 33.6 30.0 - 36.0 g/dL   RDW 16.1 09.6 - 04.5 %   Platelets 290 150 - 400 K/uL   Neutrophils Relative % 87 %   Neutro Abs 10.7 (H) 1.7 - 7.7 K/uL   Lymphocytes Relative 11 %   Lymphs Abs 1.3 0.7 - 4.0 K/uL   Monocytes Relative 2 %   Monocytes Absolute 0.3 0.1 - 1.0 K/uL   Eosinophils Relative 0 %   Eosinophils Absolute 0.0 0.0 - 0.7 K/uL   Basophils Relative 0 %   Basophils Absolute 0.0 0.0 - 0.1 K/uL  Protime-INR     Status: None   Collection Time: 08/26/17  9:31 PM  Result Value Ref Range   Prothrombin Time 14.8 11.4 - 15.2 seconds   INR 1.17   Lipase, blood     Status: None   Collection Time: 08/26/17  9:31 PM  Result Value Ref Range   Lipase 37 11 - 51 U/L  Type and screen [facility]     Status: None   Collection Time: 08/26/17  9:41 PM  Result Value Ref Range   ABO/RH(D) A POS    Antibody Screen NEG    Sample Expiration 08/29/2017   ABO/Rh     Status: None   Collection Time: 08/26/17  9:41 PM  Result Value Ref Range   ABO/RH(D) A POS   I-Stat Chem 8, ED     Status: Abnormal   Collection Time:  08/26/17  9:50 PM  Result Value Ref Range   Sodium 136 135 - 145 mmol/L   Potassium 4.5 3.5 - 5.1 mmol/L   Chloride 101 101 - 111 mmol/L   BUN 45 (H) 6 - 20 mg/dL   Creatinine, Ser 4.09 0.61 - 1.24 mg/dL   Glucose, Bld 811 (H) 65 - 99 mg/dL   Calcium, Ion 9.14 (L) 1.15 - 1.40 mmol/L   TCO2 24 22 - 32 mmol/L   Hemoglobin 10.2 (L) 13.0 - 17.0 g/dL   HCT 78.2 (L) 95.6 - 21.3 %  I-Stat CG4 Lactic Acid, ED     Status: Abnormal   Collection Time: 08/26/17  9:51 PM  Result Value Ref Range   Lactic Acid, Venous 2.02 (HH) 0.5 - 1.9 mmol/L   Comment NOTIFIED PHYSICIAN   APTT     Status: None   Collection Time: 08/26/17 11:45 PM  Result Value Ref Range   aPTT 28 24 - 36 seconds  Lactic acid, plasma     Status: None   Collection Time: 08/26/17 11:45 PM  Result Value Ref Range   Lactic Acid, Venous 1.3 0.5 - 1.9 mmol/L  Procalcitonin     Status: None   Collection Time: 08/26/17 11:45 PM  Result Value Ref Range   Procalcitonin <0.10 ng/mL  CBC     Status: Abnormal   Collection Time: 08/26/17 11:45 PM  Result Value Ref Range   WBC 8.1 4.0 - 10.5 K/uL   RBC 3.14 (L) 4.22 - 5.81 MIL/uL   Hemoglobin 9.7 (L) 13.0 - 17.0 g/dL   HCT 08.6 (L) 57.8 - 46.9 %   MCV 88.9 78.0 - 100.0 fL   MCH 30.9 26.0 - 34.0 pg   MCHC 34.8 30.0 - 36.0 g/dL   RDW 62.9 52.8 - 41.3 %   Platelets 201 150 - 400 K/uL  HIV antibody (Routine Testing)     Status: None   Collection Time: 08/26/17 11:45 PM  Result Value Ref Range   HIV  Screen 4th Generation wRfx Non Reactive Non Reactive  Rapid urine drug screen (hospital performed)     Status: Abnormal   Collection Time: 08/27/17  1:36 AM  Result Value Ref Range   Opiates NONE DETECTED NONE DETECTED   Cocaine NONE DETECTED NONE DETECTED   Benzodiazepines NONE DETECTED NONE DETECTED   Amphetamines NONE DETECTED NONE DETECTED   Tetrahydrocannabinol POSITIVE (A) NONE DETECTED   Barbiturates NONE DETECTED NONE DETECTED  Lactic acid, plasma     Status: None    Collection Time: 08/27/17  2:30 AM  Result Value Ref Range   Lactic Acid, Venous 0.8 0.5 - 1.9 mmol/L  CBC     Status: Abnormal   Collection Time: 08/27/17  5:46 AM  Result Value Ref Range   WBC 6.9 4.0 - 10.5 K/uL   RBC 2.88 (L) 4.22 - 5.81 MIL/uL   Hemoglobin 8.8 (L) 13.0 - 17.0 g/dL   HCT 81.1 (L) 91.4 - 78.2 %   MCV 89.6 78.0 - 100.0 fL   MCH 30.6 26.0 - 34.0 pg   MCHC 34.1 30.0 - 36.0 g/dL   RDW 95.6 21.3 - 08.6 %   Platelets 171 150 - 400 K/uL  Basic metabolic panel     Status: Abnormal   Collection Time: 08/27/17  5:46 AM  Result Value Ref Range   Sodium 138 135 - 145 mmol/L   Potassium 3.7 3.5 - 5.1 mmol/L   Chloride 107 101 - 111 mmol/L   CO2 25 22 - 32 mmol/L   Glucose, Bld 101 (H) 65 - 99 mg/dL   BUN 25 (H) 6 - 20 mg/dL   Creatinine, Ser 5.78 0.61 - 1.24 mg/dL   Calcium 7.8 (L) 8.9 - 10.3 mg/dL   GFR calc non Af Amer >60 >60 mL/min   GFR calc Af Amer >60 >60 mL/min   Anion gap 6 5 - 15  CBC     Status: Abnormal   Collection Time: 08/27/17 11:05 AM  Result Value Ref Range   WBC 6.1 4.0 - 10.5 K/uL   RBC 2.75 (L) 4.22 - 5.81 MIL/uL   Hemoglobin 8.4 (L) 13.0 - 17.0 g/dL   HCT 46.9 (L) 62.9 - 52.8 %   MCV 88.7 78.0 - 100.0 fL   MCH 30.5 26.0 - 34.0 pg   MCHC 34.4 30.0 - 36.0 g/dL   RDW 41.3 24.4 - 01.0 %   Platelets 166 150 - 400 K/uL  Iron and TIBC     Status: Abnormal   Collection Time: 08/27/17  1:05 PM  Result Value Ref Range   Iron 242 (H) 45 - 182 ug/dL   TIBC 272 536 - 644 ug/dL   Saturation Ratios 83 (H) 17.9 - 39.5 %   UIBC 49 ug/dL  Ferritin     Status: None   Collection Time: 08/27/17  1:05 PM  Result Value Ref Range   Ferritin 55 24 - 336 ng/mL     Ct Abdomen Pelvis Wo Contrast  Result Date: 08/27/2017 CLINICAL DATA:  Lower abdominal pain EXAM: CT ABDOMEN AND PELVIS WITHOUT CONTRAST TECHNIQUE: Multidetector CT imaging of the abdomen and pelvis was performed following the standard protocol without IV contrast. COMPARISON:  08/26/2017 FINDINGS:  Lower chest: No acute abnormality. Hepatobiliary: No focal liver abnormality is seen. No gallstones, gallbladder wall thickening, or biliary dilatation. Pancreas: Unremarkable. No pancreatic ductal dilatation or surrounding inflammatory changes. Spleen: Normal in size without focal abnormality. Adrenals/Urinary Tract: Adrenal glands are unremarkable. Kidneys are normal, without  renal calculi, focal lesion, or hydronephrosis. Bladder is unremarkable. Stomach/Bowel: Stomach is within normal limits. Appendix appears normal. No evidence of bowel wall thickening, distention, or inflammatory changes. Vascular/Lymphatic: No significant vascular findings are present. No enlarged abdominal or pelvic lymph nodes. Reproductive: Prostate is unremarkable. Other: No abdominal wall hernia or abnormality. No abdominopelvic ascites. Small fatty periumbilical hernia. Musculoskeletal: Degenerative changes. No acute or suspicious bone lesion. IMPRESSION: No CT evidence for acute intra-abdominal pelvic abnormality. Negative for hydronephrosis or ureteral stone. Electronically Signed   By: Jasmine PangKim  Fujinaga M.D.   On: 08/27/2017 00:20   Dg Abdomen 1 View  Result Date: 08/26/2017 CLINICAL DATA:  Hematemesis EXAM: ABDOMEN - 1 VIEW COMPARISON:  None. FINDINGS: Visible lung bases are clear. No definitive free air beneath the diaphragm. Visible gas pattern is nonobstructed IMPRESSION: Incomplete inclusion of the lower abdomen and pelvis. Visible gas pattern is nonobstructed Electronically Signed   By: Jasmine PangKim  Fujinaga M.D.   On: 08/26/2017 22:34   Dg Chest Port 1 View  Result Date: 08/26/2017 CLINICAL DATA:  Hematemesis, nausea and vomiting times 10 or more today. EXAM: PORTABLE CHEST 1 VIEW COMPARISON:  09/05/2012 FINDINGS: The heart size and mediastinal contours are within normal limits. Both lungs are clear. The right costophrenic angle is excluded on this frontal view. No significant pleural effusion or pneumothorax. The visualized  skeletal structures are unremarkable. IMPRESSION: No active disease. Electronically Signed   By: Tollie Ethavid  Kwon M.D.   On: 08/26/2017 22:05    ROS:  As stated above in the HPI otherwise negative.  Blood pressure 130/65, pulse 72, temperature 98.4 F (36.9 C), temperature source Oral, resp. rate 16, height 5\' 10"  (1.778 m), weight 85.3 kg (188 lb), SpO2 97 %.    PE: Gen: NAD, Alert and Oriented HEENT:  [place]/AT, EOMI Neck: Supple, no LAD Lungs: CTA Bilaterally CV: RRR without M/G/R ABM: Soft, NTND, +BS Ext: No C/C/E  Assessment/Plan: 1) Hematemesis. 2) NSAID use. 3) Anemia.   The patient requires further evaluation with an EGD.  Further recommendations will be made pending the findings.  Caelin Rayl D 08/27/2017, 2:52 PM

## 2017-08-27 NOTE — Progress Notes (Signed)
Patient accepted dinner tray and tolerated meal without difficulty. No emesis, no c/o nausea. No c/o pain.

## 2017-08-27 NOTE — Anesthesia Preprocedure Evaluation (Deleted)
Anesthesia Evaluation  Patient identified by MRN, date of birth, ID band Patient awake    Reviewed: Allergy & Precautions, H&P , Patient's Chart, lab work & pertinent test results, reviewed documented beta blocker date and time   Airway Mallampati: II  TM Distance: >3 FB Neck ROM: full    Dental no notable dental hx.    Pulmonary former smoker,    Pulmonary exam normal breath sounds clear to auscultation       Cardiovascular hypertension,  Rhythm:regular Rate:Normal     Neuro/Psych    GI/Hepatic   Endo/Other    Renal/GU      Musculoskeletal   Abdominal   Peds  Hematology  (+) anemia ,   Anesthesia Other Findings   Reproductive/Obstetrics                             Anesthesia Physical Anesthesia Plan  ASA: II  Anesthesia Plan: MAC   Post-op Pain Management:    Induction: Intravenous  PONV Risk Score and Plan:   Airway Management Planned: Mask and Natural Airway  Additional Equipment:   Intra-op Plan:   Post-operative Plan:   Informed Consent: I have reviewed the patients History and Physical, chart, labs and discussed the procedure including the risks, benefits and alternatives for the proposed anesthesia with the patient or authorized representative who has indicated his/her understanding and acceptance.   Dental Advisory Given  Plan Discussed with: CRNA and Surgeon  Anesthesia Plan Comments:         Anesthesia Quick Evaluation

## 2017-08-28 DIAGNOSIS — K920 Hematemesis: Secondary | ICD-10-CM

## 2017-08-28 DIAGNOSIS — D500 Iron deficiency anemia secondary to blood loss (chronic): Secondary | ICD-10-CM

## 2017-08-28 DIAGNOSIS — N179 Acute kidney failure, unspecified: Secondary | ICD-10-CM

## 2017-08-28 LAB — CBC
HCT: 22.3 % — ABNORMAL LOW (ref 39.0–52.0)
HEMATOCRIT: 21.9 % — AB (ref 39.0–52.0)
HEMOGLOBIN: 7.7 g/dL — AB (ref 13.0–17.0)
HEMOGLOBIN: 7.8 g/dL — AB (ref 13.0–17.0)
MCH: 31.4 pg (ref 26.0–34.0)
MCH: 31.2 pg (ref 26.0–34.0)
MCHC: 35.2 g/dL (ref 30.0–36.0)
MCHC: 35 g/dL (ref 30.0–36.0)
MCV: 89.4 fL (ref 78.0–100.0)
MCV: 89.2 fL (ref 78.0–100.0)
PLATELETS: 133 10*3/uL — AB (ref 150–400)
Platelets: 138 10*3/uL — ABNORMAL LOW (ref 150–400)
RBC: 2.45 MIL/uL — AB (ref 4.22–5.81)
RBC: 2.5 MIL/uL — ABNORMAL LOW (ref 4.22–5.81)
RDW: 13.2 % (ref 11.5–15.5)
RDW: 13.1 % (ref 11.5–15.5)
WBC: 4.7 10*3/uL (ref 4.0–10.5)
WBC: 4.7 10*3/uL (ref 4.0–10.5)

## 2017-08-28 LAB — GLUCOSE, CAPILLARY: GLUCOSE-CAPILLARY: 100 mg/dL — AB (ref 65–99)

## 2017-08-28 LAB — BASIC METABOLIC PANEL
ANION GAP: 8 (ref 5–15)
BUN: 14 mg/dL (ref 6–20)
CO2: 23 mmol/L (ref 22–32)
Calcium: 8.4 mg/dL — ABNORMAL LOW (ref 8.9–10.3)
Chloride: 109 mmol/L (ref 101–111)
Creatinine, Ser: 0.87 mg/dL (ref 0.61–1.24)
GFR calc Af Amer: >60 mL/min (ref >60)
GLUCOSE: 93 mg/dL (ref 65–99)
POTASSIUM: 4 mmol/L (ref 3.5–5.1)
Sodium: 140 mmol/L (ref 135–145)

## 2017-08-28 MED ORDER — PANTOPRAZOLE SODIUM 40 MG PO TBEC
40.0000 mg | DELAYED_RELEASE_TABLET | Freq: Two times a day (BID) | ORAL | Status: DC
  Administered 2017-08-28: 40 mg via ORAL
  Filled 2017-08-28: qty 1

## 2017-08-28 MED ORDER — PANTOPRAZOLE SODIUM 40 MG PO TBEC: 40.0000 mg | DELAYED_RELEASE_TABLET | Freq: Two times a day (BID) | ORAL | 0 refills | Status: DC

## 2017-08-28 NOTE — Progress Notes (Signed)
Pt discharge instructions reviewed with pt. Pt verbalizes understanding. Pt belongings with pt. Pt is not in distress. Pt states he does not want a wheelchair for discharge.

## 2017-08-28 NOTE — Progress Notes (Signed)
RN discontinued IV fluids per MD order.

## 2017-08-28 NOTE — Discharge Summary (Signed)
Physician Discharge Summary  Elijan Googe ION:[id number] DOB: 1981-03-05 DOA: 08/26/2017  PCP: Knox Royalty, MD  Admit date: 08/26/2017 Discharge date: 08/28/2017  Admitted From:home Disposition:home  Recommendations for Outpatient Follow-up:  1. Follow up with PCP in 1-2 weeks 2. Please obtain BMP/CBC in one week  Home Health:no Equipment/Devices:none Discharge Condition:stable CODE STATUS:full code Diet recommendation:heart healthy  Brief/Interim Summary: 37 y.o.malewith medical history significant ofdepression, psychosis, PTSD, OCD, substance abuse (marijuana), who presents with hematemesis, abdominal pain.  In the ER patient was found to have hemoglobin 10.2 dropped from 15.1 in June 2018, leukocytosis, mild lactic acidosis, CT scan of abdomen pelvis negative for acute abnormalities.  Admitted for further evaluation.  #Hematemesis and hypotension likely contributed by NSAID use.  Patient reported taking 800 mg of ibuprofen twice a day for about a week.   -Evaluated by gastroenterologist and underwent upper GI showed reflux esophagitis and hiatal hernia.  Recommended to take Protonix twice a day for a month and then once daily indefinitely.  Follow-up with PCP.  Hemoglobin is now stable.  I recommended to monitor CBC in a week.  Patient with no nausea vomiting.  Advised to avoid any ibuprofen.  Eating okay. -Chest x-ray, CT scan unremarkable.  #Acute blood loss anemia: Hemoglobin low but is stable.  I will acceptable.  Repeat lab as outpatient.  #Anxiety depression: Stable.  Continue home medication.  #Acute kidney injury: Due to NSAID and hypotension.  Serum creatinine level  improved.  Avoid NSAIDs or any nephrotoxins.  #SIRS criteria: Mild elevation in lactic acid due to vomiting.    Improved.  Blood cultures negative so far.  No sign of infection and patient is clinically improved.   Discharge Diagnoses:  Principal Problem:   Hematemesis Active Problems:   MDD  (major depressive disorder)   Hypotension   AKI (acute kidney injury) (HCC)   Diarrhea   Rash   SIRS (systemic inflammatory response syndrome) (HCC)    Discharge Instructions  Discharge Instructions    Call MD for:  difficulty breathing, headache or visual disturbances   Complete by:  As directed    Call MD for:  extreme fatigue   Complete by:  As directed    Call MD for:  hives   Complete by:  As directed    Call MD for:  persistant dizziness or light-headedness   Complete by:  As directed    Call MD for:  persistant nausea and vomiting   Complete by:  As directed    Call MD for:  severe uncontrolled pain   Complete by:  As directed    Call MD for:  temperature >100.4   Complete by:  As directed    Diet - low sodium heart healthy   Complete by:  As directed    Discharge instructions   Complete by:  As directed    Please check CBC in a week.   Increase activity slowly   Complete by:  As directed      Allergies as of 08/28/2017   No Known Allergies     Medication List    TAKE these medications   ARIPiprazole 9.75 MG/1.3ML injection Commonly known as:  ABILIFY Inject 15 mg into the muscle every 30 (thirty) days. What changed:  Another medication with the same name was removed. Continue taking this medication, and follow the directions you see here.   fluvoxaMINE 100 MG tablet Commonly known as:  LUVOX Take 1 tablet (100 mg total) by mouth 2 (two) times daily.  pantoprazole 40 MG tablet Commonly known as:  PROTONIX Take 1 tablet (40 mg total) by mouth 2 (two) times daily. Take twice a day before meal for a month and then once a day.      Follow-up Information    Knox Royalty, MD. Schedule an appointment as soon as possible for a visit in 1 week(s).   Specialty:  Family Medicine Contact information: [address] [place] [address] [phone]          No Known Allergies  Consultations: GI  Procedures/Studies: Endoscopy  Subjective: Seen  and examined at bedside.  Denies headache, dizziness, nausea vomiting chest pain shortness of breath.  Tolerating diet well.  No further vomiting or diarrhea.  Discharge Exam: Vitals:   08/28/17 0051 08/28/17 0517  BP: (!) 98/55 (!) 120/55  Pulse: 76 76  Resp: 18 18  Temp: 98 F (36.7 C) 98.2 F (36.8 C)  SpO2: 97% 100%   Vitals:   08/27/17 1610 08/27/17 2029 08/28/17 0051 08/28/17 0517  BP: (!) 108/57 (!) 105/55 (!) 98/55 (!) 120/55  Pulse: 75 81 76 76  Resp: (!) 22 20 18 18   Temp:  98.2 F (36.8 C) 98 F (36.7 C) 98.2 F (36.8 C)  TempSrc:  Oral Oral Oral  SpO2: 98% 100% 97% 100%  Weight:    87.6 kg (193 lb 1.6 oz)  Height:        General: Pt is alert, awake, not in acute distress Cardiovascular: RRR, S1/S2 +, no rubs, no gallops Respiratory: CTA bilaterally, no wheezing, no rhonchi Abdominal: Soft, NT, ND, bowel sounds + Extremities: no edema, no cyanosis    The results of significant diagnostics from this hospitalization (including imaging, microbiology, ancillary and laboratory) are listed below for reference.     Microbiology: Recent Results (from the past 240 hour(s))  Culture, blood (Routine X 2) w Reflex to ID Panel     Status: None (Preliminary result)   Collection Time: 08/26/17 11:45 PM  Result Value Ref Range Status   Specimen Description BLOOD RIGHT ANTECUBITAL  Final   Special Requests   Final    BOTTLES DRAWN AEROBIC AND ANAEROBIC Blood Culture adequate volume   Culture NO GROWTH 1 DAY  Final   Report Status PENDING  Incomplete  Culture, blood (Routine X 2) w Reflex to ID Panel     Status: None (Preliminary result)   Collection Time: 08/26/17 11:50 PM  Result Value Ref Range Status   Specimen Description BLOOD RIGHT HAND  Final   Special Requests   Final    BOTTLES DRAWN AEROBIC AND ANAEROBIC Blood Culture adequate volume   Culture NO GROWTH 1 DAY  Final   Report Status PENDING  Incomplete     Labs: BNP (last 3 results) No results for  input(s): BNP in the last 8760 hours. Basic Metabolic Panel: Recent Labs  Lab 08/26/17 2131 08/26/17 2150 08/27/17 0546 08/28/17 0616  NA 137 136 138 140  K 4.7 4.5 3.7 4.0  CL 102 101 107 109  CO2 21*  --  25 23  GLUCOSE 186* 182* 101* 93  BUN 41* 45* 25* 14  CREATININE 1.41* 1.20 1.06 0.87  CALCIUM 8.5*  --  7.8* 8.4*   Liver Function Tests: Recent Labs  Lab 08/26/17 2131  AST 26  ALT 21  ALKPHOS 29*  BILITOT 0.8  PROT 5.7*  ALBUMIN 3.5   Recent Labs  Lab 08/26/17 2131  LIPASE 37   No results for input(s): AMMONIA  in the last 168 hours. CBC: Recent Labs  Lab 08/26/17 2131  08/26/17 2345 08/27/17 0546 08/27/17 1105 08/28/17 0616 08/28/17 1116  WBC 12.3*  --  8.1 6.9 6.1 4.7 4.7  NEUTROABS 10.7*  --   --   --   --   --   --   HGB 10.9*   < > 9.7* 8.8* 8.4* 7.7* 7.8*  HCT 32.4*   < > 27.9* 25.8* 24.4* 21.9* 22.3*  MCV 89.8  --  88.9 89.6 88.7 89.4 89.2  PLT 290  --  201 171 166 138* 133*   < > = values in this interval not displayed.   Cardiac Enzymes: No results for input(s): CKTOTAL, CKMB, CKMBINDEX, TROPONINI in the last 168 hours. BNP: Invalid input(s): POCBNP CBG: Recent Labs  Lab 08/27/17 2130 08/28/17 0706  GLUCAP 95 100*   D-Dimer No results for input(s): DDIMER in the last 72 hours. Hgb A1c No results for input(s): HGBA1C in the last 72 hours. Lipid Profile No results for input(s): CHOL, HDL, LDLCALC, TRIG, CHOLHDL, LDLDIRECT in the last 72 hours. Thyroid function studies No results for input(s): TSH, T4TOTAL, T3FREE, THYROIDAB in the last 72 hours.  Invalid input(s): FREET3 Anemia work up Recent Labs    08/27/17 1305  FERRITIN 55  TIBC 291  IRON 242*   Urinalysis    Component Value Date/Time   COLORURINE AMBER (A) 07/16/2016 0745   APPEARANCEUR HAZY (A) 07/16/2016 0745   LABSPEC 1.025 07/16/2016 0745   PHURINE 6.0 07/16/2016 0745   GLUCOSEU NEGATIVE 07/16/2016 0745   HGBUR NEGATIVE 07/16/2016 0745   BILIRUBINUR NEGATIVE  07/16/2016 0745   KETONESUR NEGATIVE 07/16/2016 0745   PROTEINUR 30 (A) 07/16/2016 0745   UROBILINOGEN 0.2 09/07/2012 2130   NITRITE NEGATIVE 07/16/2016 0745   LEUKOCYTESUR NEGATIVE 07/16/2016 0745   Sepsis Labs Invalid input(s): PROCALCITONIN,  WBC,  LACTICIDVEN Microbiology Recent Results (from the past 240 hour(s))  Culture, blood (Routine X 2) w Reflex to ID Panel     Status: None (Preliminary result)   Collection Time: 08/26/17 11:45 PM  Result Value Ref Range Status   Specimen Description BLOOD RIGHT ANTECUBITAL  Final   Special Requests   Final    BOTTLES DRAWN AEROBIC AND ANAEROBIC Blood Culture adequate volume   Culture NO GROWTH 1 DAY  Final   Report Status PENDING  Incomplete  Culture, blood (Routine X 2) w Reflex to ID Panel     Status: None (Preliminary result)   Collection Time: 08/26/17 11:50 PM  Result Value Ref Range Status   Specimen Description BLOOD RIGHT HAND  Final   Special Requests   Final    BOTTLES DRAWN AEROBIC AND ANAEROBIC Blood Culture adequate volume   Culture NO GROWTH 1 DAY  Final   Report Status PENDING  Incomplete     Time coordinating discharge: 28 minutes  SIGNED:   Maxie Barbron Prasad Bhandari, MD  [facility] 08/28/2017, 1:19 PM  If 7PM-7AM, please contact night-coverage [email or web address] Password [email or web address]

## 2017-08-30 ENCOUNTER — Encounter (HOSPITAL_COMMUNITY): Payer: Self-pay | Admitting: Gastroenterology

## 2017-09-01 LAB — CULTURE, BLOOD (ROUTINE X 2)
Culture: NO GROWTH
Culture: NO GROWTH
Special Requests: ADEQUATE
Special Requests: ADEQUATE

## 2018-06-30 IMAGING — CT CT ABD-PELV W/O CM
2 of 4 series · 17 of 46 positions shown, 19 images · non-contrast
Comparison: 08/26/2017

CLINICAL DATA: Lower abdominal pain

EXAM:
CT ABDOMEN AND PELVIS WITHOUT CONTRAST
TECHNIQUE: Multidetector CT imaging of the abdomen and pelvis was performed
following the standard protocol without IV contrast.

[Series 3: abd/ pelvis 5.0 i30f 2 · axial · 0.86mm/px · z∈[-685,-225]mm · 14 of 100 slices shown, 16 images]
[im 4/100  soft-tissue]
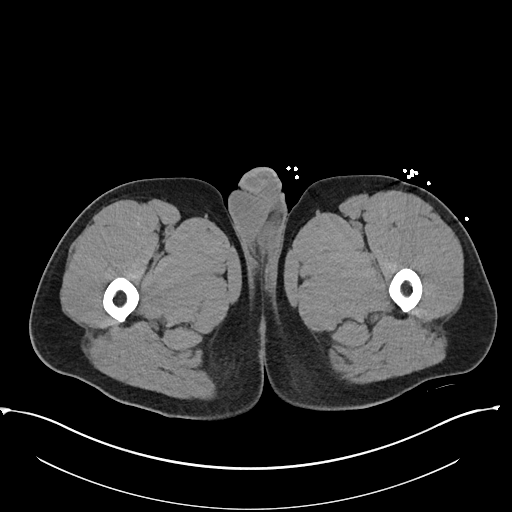
[im 4/100  bone]
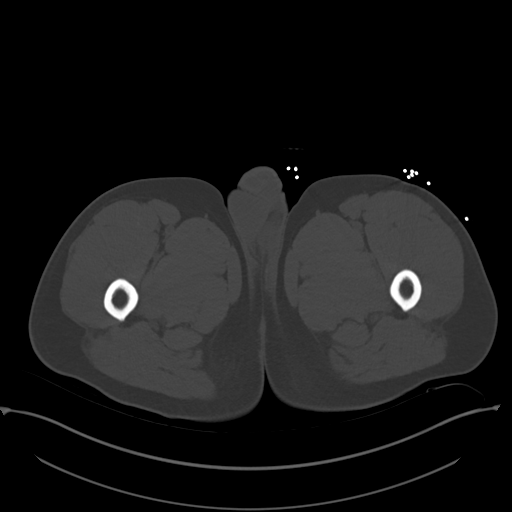
[im 12/100  soft-tissue]
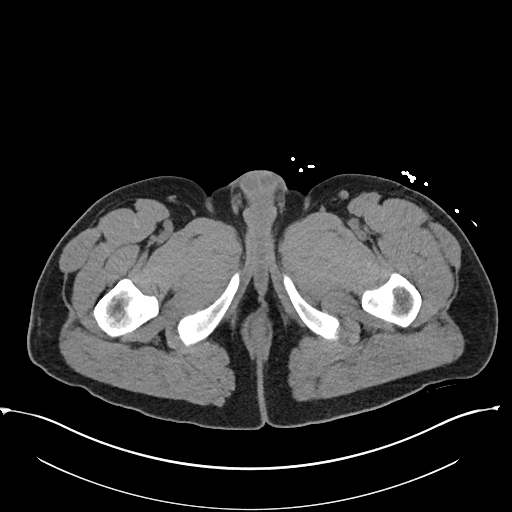
[im 20/100  soft-tissue]
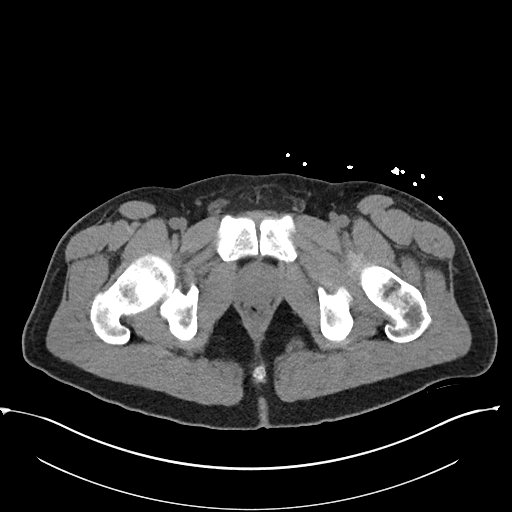
[im 28/100  soft-tissue]
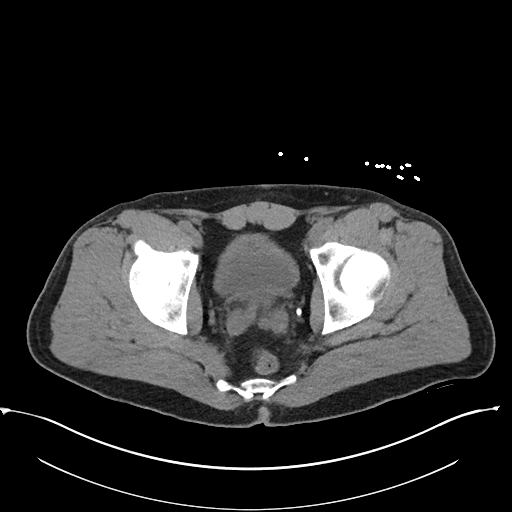
[im 32/100  soft-tissue]
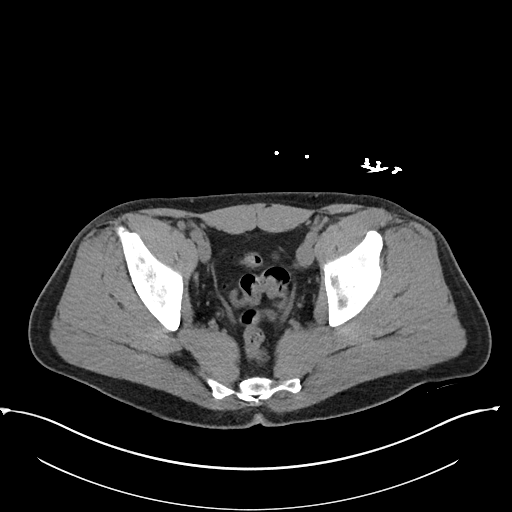
[im 40/100  soft-tissue]
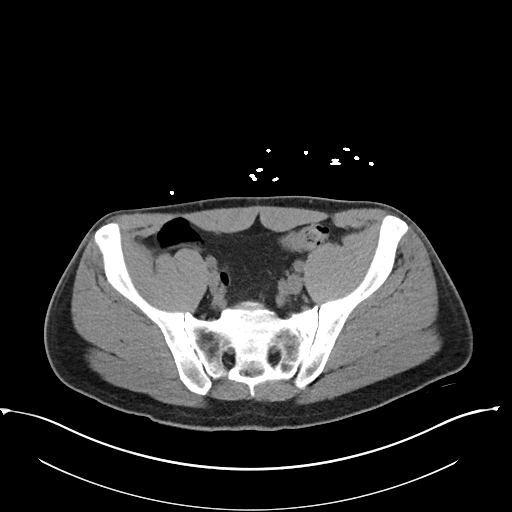
[im 48/100  soft-tissue]
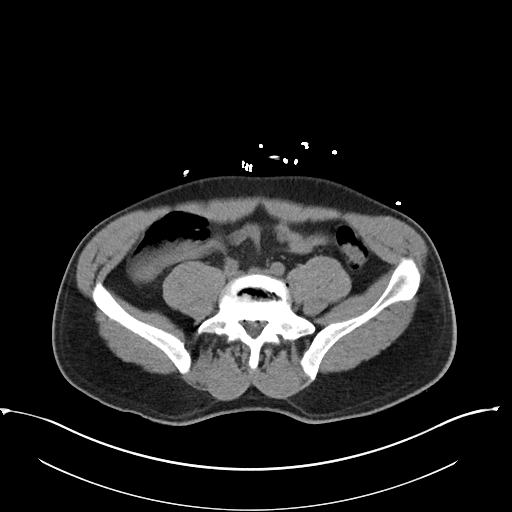
[im 52/100  soft-tissue]
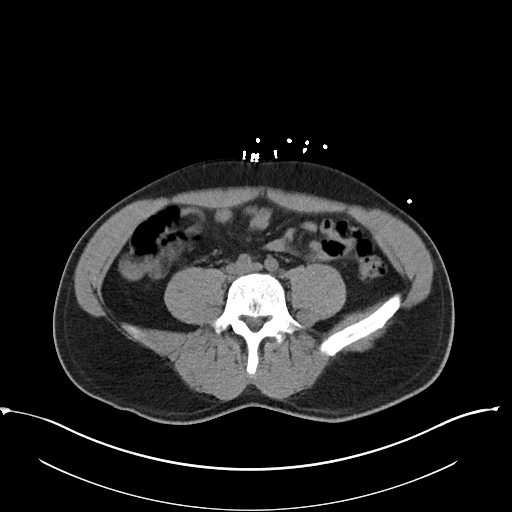
[im 60/100  soft-tissue]
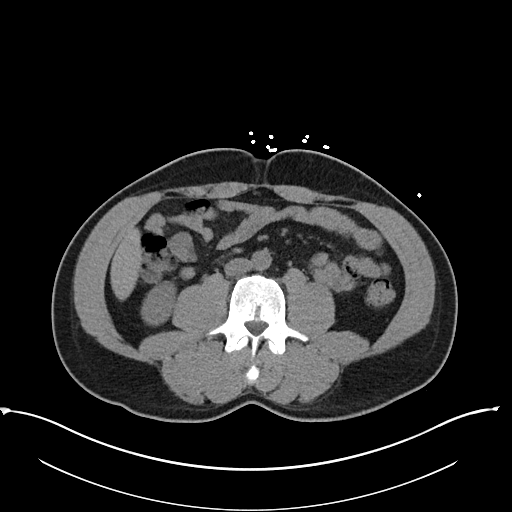
[im 60/100  bone]
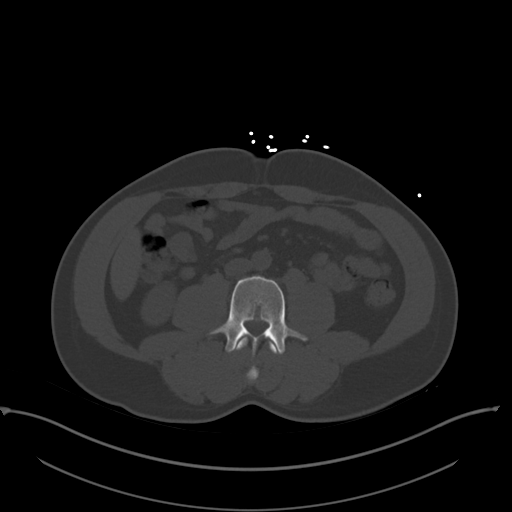
[im 68/100  soft-tissue]
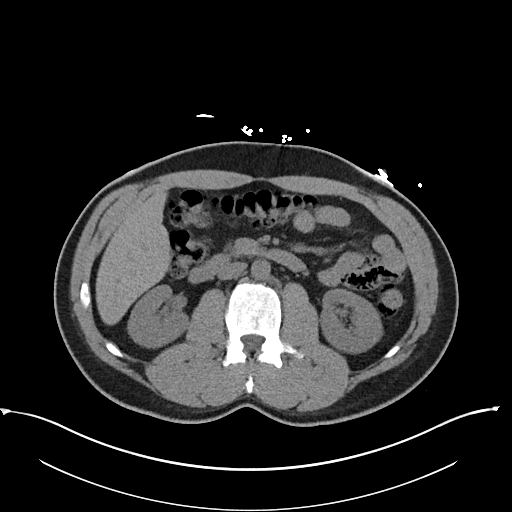
[im 76/100  soft-tissue]
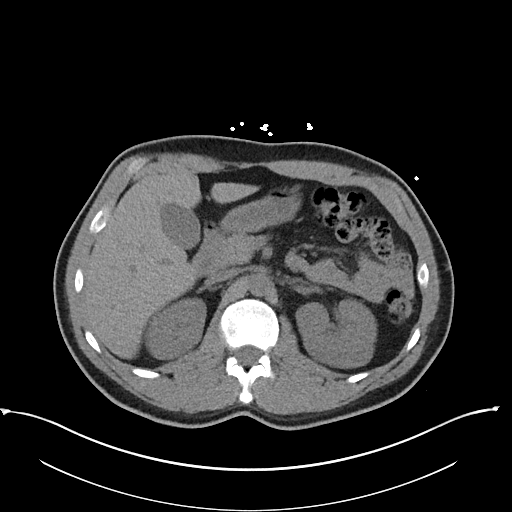
[im 80/100  soft-tissue]
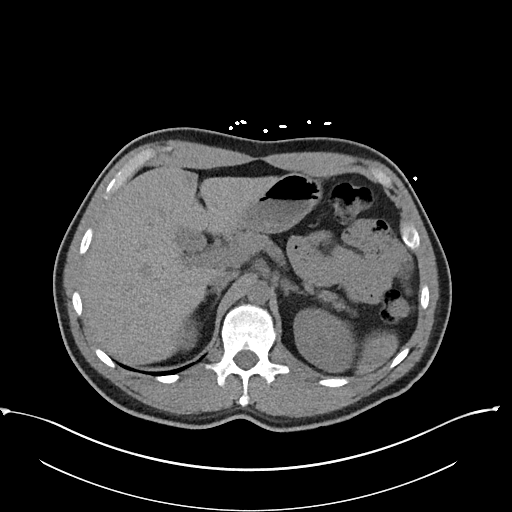
[im 88/100  soft-tissue]
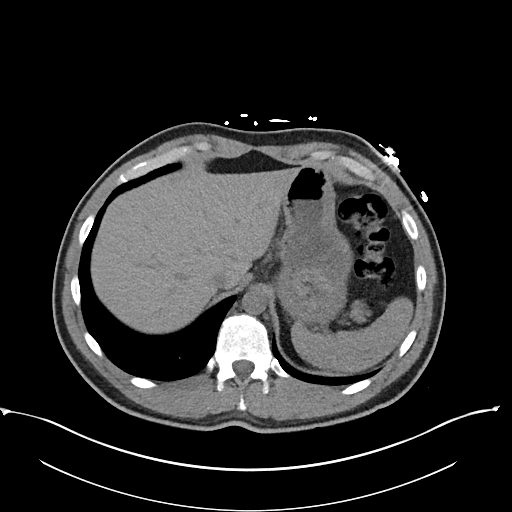
[im 96/100  soft-tissue]
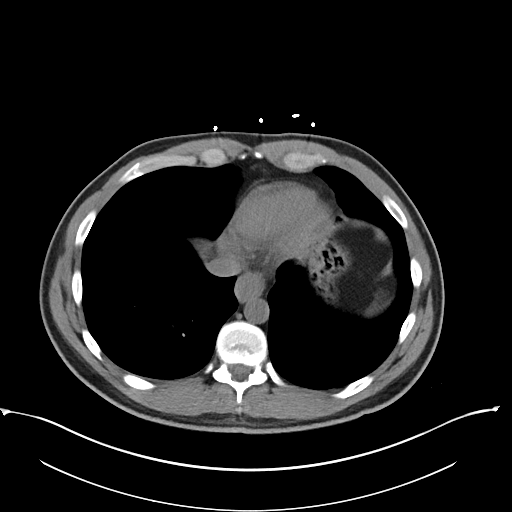

[Series 6: cor st · coronal · 0.86mm/px · 3 of 87 slices shown]
[im 29/87  soft-tissue]
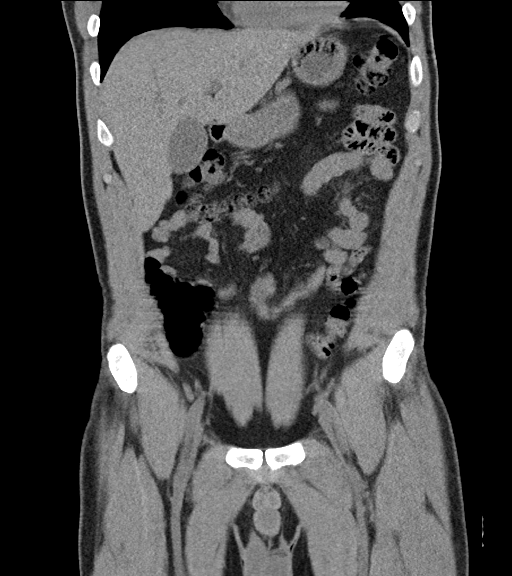
[im 39/87  soft-tissue]
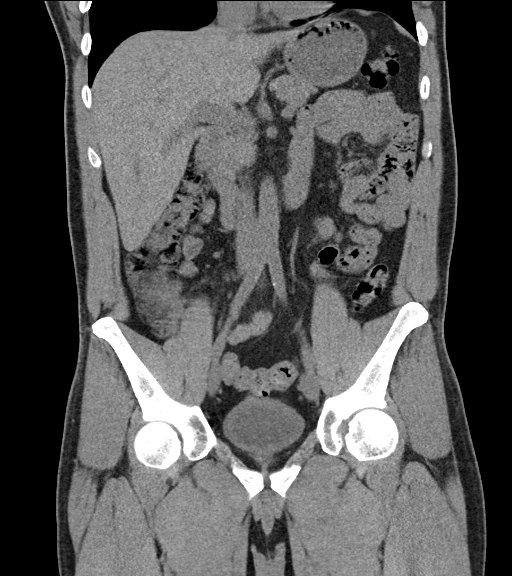
[im 48/87  soft-tissue]
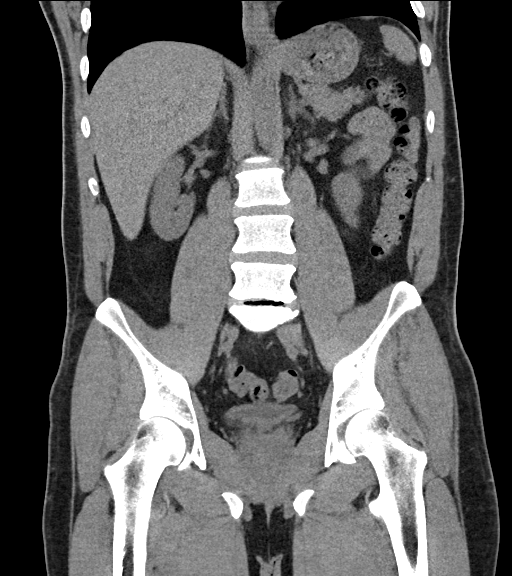

[17 of 46 positions shown; findings below may reference images not displayed]

FINDINGS: Lower chest: No acute abnormality.

Hepatobiliary: No focal liver abnormality is seen. No gallstones,
gallbladder wall thickening, or biliary dilatation.

Pancreas: Unremarkable. No pancreatic ductal dilatation or
surrounding inflammatory changes.

Spleen: Normal in size without focal abnormality.

Adrenals/Urinary Tract: Adrenal glands are unremarkable. Kidneys are
normal, without renal calculi, focal lesion, or hydronephrosis.
Bladder is unremarkable.

Stomach/Bowel: Stomach is within normal limits. Appendix appears
normal. No evidence of bowel wall thickening, distention, or
inflammatory changes.

Vascular/Lymphatic: No significant vascular findings are present. No
enlarged abdominal or pelvic lymph nodes.

Reproductive: Prostate is unremarkable.

Other: No abdominal wall hernia or abnormality. No abdominopelvic
ascites. Small fatty periumbilical hernia.

Musculoskeletal: Degenerative changes. No acute or suspicious bone
lesion.
IMPRESSION: No CT evidence for acute intra-abdominal pelvic abnormality.
Negative for hydronephrosis or ureteral stone.

## 2019-10-27 ENCOUNTER — Emergency Department (HOSPITAL_COMMUNITY)
Admission: EM | Admit: 2019-10-27 | Discharge: 2019-10-27 | Disposition: A | Discharge status: Home / Self Care | Payer: Managed Care, Other (non HMO) | Attending: Emergency Medicine | Admitting: Emergency Medicine

## 2019-10-27 ENCOUNTER — Emergency Department (HOSPITAL_COMMUNITY): Payer: Managed Care, Other (non HMO)

## 2019-10-27 ENCOUNTER — Encounter (HOSPITAL_COMMUNITY): Payer: Self-pay | Admitting: Clinical

## 2019-10-27 DIAGNOSIS — F1210 Cannabis abuse, uncomplicated: Secondary | ICD-10-CM | POA: Diagnosis not present

## 2019-10-27 DIAGNOSIS — R410 Disorientation, unspecified: Secondary | ICD-10-CM | POA: Insufficient documentation

## 2019-10-27 DIAGNOSIS — R4182 Altered mental status, unspecified: Secondary | ICD-10-CM

## 2019-10-27 DIAGNOSIS — Z79899 Other long term (current) drug therapy: Secondary | ICD-10-CM | POA: Diagnosis not present

## 2019-10-27 DIAGNOSIS — Z87891 Personal history of nicotine dependence: Secondary | ICD-10-CM | POA: Insufficient documentation

## 2019-10-27 DIAGNOSIS — I10 Essential (primary) hypertension: Secondary | ICD-10-CM | POA: Insufficient documentation

## 2019-10-27 LAB — CBC WITH DIFFERENTIAL/PLATELET
Abs Immature Granulocytes: 0.02 10*3/uL (ref 0.00–0.07)
Basophils Absolute: 0 10*3/uL (ref 0.0–0.1)
Basophils Relative: 1 %
Eosinophils Absolute: 0.1 10*3/uL (ref 0.0–0.5)
Eosinophils Relative: 2 %
HCT: 49.8 % (ref 39.0–52.0)
Hemoglobin: 16.6 g/dL (ref 13.0–17.0)
Immature Granulocytes: 0 %
Lymphocytes Relative: 20 %
Lymphs Abs: 1.7 10*3/uL (ref 0.7–4.0)
MCH: 30.2 pg (ref 26.0–34.0)
MCHC: 33.3 g/dL (ref 30.0–36.0)
MCV: 90.7 fL (ref 80.0–100.0)
Monocytes Absolute: 0.6 10*3/uL (ref 0.1–1.0)
Monocytes Relative: 7 %
Neutro Abs: 6 10*3/uL (ref 1.7–7.7)
Neutrophils Relative %: 70 %
Platelets: 213 10*3/uL (ref 150–400)
RBC: 5.49 MIL/uL (ref 4.22–5.81)
RDW: 12.6 % (ref 11.5–15.5)
WBC: 8.4 10*3/uL (ref 4.0–10.5)
nRBC: 0 % (ref 0.0–0.2)

## 2019-10-27 LAB — COMPREHENSIVE METABOLIC PANEL
ALT: 35 U/L (ref 0–44)
AST: 26 U/L (ref 15–41)
Albumin: 4.2 g/dL (ref 3.5–5.0)
Alkaline Phosphatase: 43 U/L (ref 38–126)
Anion gap: 10 (ref 5–15)
BUN: 16 mg/dL (ref 6–20)
CO2: 23 mmol/L (ref 22–32)
Calcium: 9.1 mg/dL (ref 8.9–10.3)
Chloride: 105 mmol/L (ref 98–111)
Creatinine, Ser: 0.93 mg/dL (ref 0.61–1.24)
GFR calc Af Amer: >60 mL/min (ref >60)
GFR calc non Af Amer: >60 mL/min (ref >60)
Glucose, Bld: 93 mg/dL (ref 70–99)
Potassium: 4.2 mmol/L (ref 3.5–5.1)
Sodium: 138 mmol/L (ref 135–145)
Total Bilirubin: 0.6 mg/dL (ref 0.3–1.2)
Total Protein: 7 g/dL (ref 6.5–8.1)

## 2019-10-27 LAB — ETHANOL: Alcohol, Ethyl (B): <10 mg/dL (ref <10)

## 2019-10-27 LAB — SALICYLATE LEVEL: Salicylate Lvl: <7 mg/dL — ABNORMAL LOW (ref 7.0–30.0)

## 2019-10-27 LAB — CBG MONITORING, ED: Glucose-Capillary: 87 mg/dL (ref 70–99)

## 2019-10-27 LAB — TROPONIN I (HIGH SENSITIVITY)
Troponin I (High Sensitivity): 4 ng/L (ref <18)
Troponin I (High Sensitivity): 4 ng/L (ref <18)

## 2019-10-27 LAB — ACETAMINOPHEN LEVEL: Acetaminophen (Tylenol), Serum: <10 ug/mL — ABNORMAL LOW (ref 10–30)

## 2019-10-27 MED ORDER — SODIUM CHLORIDE 0.9 % IV BOLUS
1000.0000 mL | Freq: Once | INTRAVENOUS | Status: AC
  Administered 2019-10-27: 1000 mL via INTRAVENOUS

## 2019-10-27 MED ORDER — LORAZEPAM 2 MG/ML IJ SOLN
1.0000 mg | Freq: Once | INTRAMUSCULAR | Status: AC
  Administered 2019-10-27: 11:00:00 1 mg via INTRAVENOUS
  Filled 2019-10-27: qty 1

## 2019-10-27 NOTE — ED Provider Notes (Signed)
[facility] EMERGENCY DEPARTMENT Provider Note   CSN: [id number] Arrival date & time: 10/27/19  8299     History Chief Complaint  Patient presents with  . Altered Mental Status    Benjamin [name] is a 39 y.o. male history of depression, PTSD, previous GI bleed here presenting with altered mental status.  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  He woke up this morning and was trying to get to work but wife noticed that he appears very confused.  He appears dazed for about an hour or so so she called EMS and brought him over here.  EMS noticed normal vitals and normal CBG.  Patient has no complaints.  He denies drug use or alcohol use.  Denies any vomiting or diarrhea.   Per the wife, patient has been behaving erratically recently.  He has been sobbing uncontrollably.  He also got lost yesterday and called her frantically.  He appears to be in a catatonic state this morning.  He adamantly denies suicidal or homicidal ideations.  He had previous psych admission.  Patient also is on Abilify injections once a month.  The history is provided by the patient. The history is limited by the condition of the patient.       Past Medical History:  Diagnosis Date  . Depression   . Hematemesis 08/2017  . Herniated disc    x3  . Hypertension   . Mental disorder     Patient Active Problem List   Diagnosis Date Noted  . Anemia, blood loss   . Hematemesis 08/26/2017  . Hypotension 08/26/2017  . AKI (acute kidney injury) (HCC) 08/26/2017  . Diarrhea 08/26/2017  . Rash 08/26/2017  . SIRS (systemic inflammatory response syndrome) (HCC) 08/26/2017  . Cannabis use disorder, moderate, dependence (HCC) 01/09/2017  . MDD (major depressive disorder), recurrent, with catatonic features (HCC) 08/09/2016  . Severe recurrent major depression with psychotic features (HCC) 07/21/2016  . MDD (major depressive disorder) 07/21/2016  . PTSD (post-traumatic  stress disorder) 07/15/2016  . OCD (obsessive compulsive disorder) 07/15/2016  . 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 Bradycardia 09/18/2012  . Decreased oral intake 09/17/2012  . Psychosis (HCC) 09/09/2012  . Severe episode of recurrent major depressive disorder, with psychotic features (HCC) 09/08/2012  . History of substance abuse (HCC) 09/08/2012    Past Surgical History:  Procedure Laterality Date  . ESOPHAGOGASTRODUODENOSCOPY Left 08/27/2017   Procedure: ESOPHAGOGASTRODUODENOSCOPY (EGD);  Surgeon: Jeani Hawking, MD;  [facility] END[facility];  Service: Endoscopy;  Laterality: Left;  . SHOULDER SURGERY    . WISDOM TOOTH EXTRACTION         Family History  Problem Relation Age of Onset  . Seizures Mother   . Stroke Mother   . Diabetes Mellitus II Mother     Social History   Tobacco Use  . Smoking status: Former Smoker    Packs/day: 1.00    Types: Cigarettes    Quit date: 04/04/2012    Years since quitting: 7.5  . Smokeless tobacco: Never Used  . Tobacco comment: Pt is nonverbal. Unknown.   Substance Use Topics  . Alcohol use: No  . Drug use: Yes    Types: Marijuana    Home Medications Prior to Admission medications   Medication Sig Start Date End Date Taking? Authorizing Provider  ARIPiprazole (ABILIFY) 9.75 MG/1.3ML injection Inject 15 mg into the muscle every 30 (thirty) days.    [provider]  fluvoxaMINE (LUVOX) 100 MG tablet Take 1 tablet (100 mg total) by mouth 2 (two) times daily. 01/16/17   Clapacs, Madie Reno, MD  pantoprazole (PROTONIX) 40 MG tablet Take 1 tablet (40 mg total) by mouth 2 (two) times daily. Take twice a day before meal for a month and then once a day. 08/28/17   Rosita Fire, MD    Allergies    Patient has no known allergies.  Review of Systems   Review of Systems  Psychiatric/Behavioral: Positive for confusion.  All other systems reviewed and are negative.   Physical Exam Updated Vital  Signs BP (!) 124/100   Resp (!) 9   Physical Exam Vitals and nursing note reviewed.  Constitutional:      Appearance: Normal appearance.  HENT:     Head: Normocephalic.     Nose: Nose normal.     Mouth/Throat:     Mouth: Mucous membranes are moist.  Eyes:     Extraocular Movements: Extraocular movements intact.     Pupils: Pupils are equal, round, and reactive to light.  Cardiovascular:     Rate and Rhythm: Normal rate and regular rhythm.     Pulses: Normal pulses.     Heart sounds: Normal heart sounds.  Pulmonary:     Effort: Pulmonary effort is normal.     Breath sounds: Normal breath sounds.  Abdominal:     General: Abdomen is flat.     Palpations: Abdomen is soft.  Musculoskeletal:        General: Normal range of motion.     Cervical back: Normal range of motion.  Skin:    General: Skin is warm.     Capillary Refill: Capillary refill takes less than 2 seconds.  Neurological:     General: No focal deficit present.     Mental Status: He is alert and oriented to person, place, and time.     Comments: Slightly slow to respond but A & O x 3, nl strength and sensation throughout, nl finger to nose bilaterally   Psychiatric:        Mood and Affect: Mood normal.     ED Results / Procedures / Treatments   Labs (all labs ordered are listed, but only abnormal results are displayed) Labs Reviewed  CBC WITH DIFFERENTIAL/PLATELET  COMPREHENSIVE METABOLIC PANEL  ETHANOL  SALICYLATE LEVEL  ACETAMINOPHEN LEVEL  URINALYSIS, ROUTINE W REFLEX MICROSCOPIC  CBG MONITORING, ED  TROPONIN I (HIGH SENSITIVITY)    EKG None  Radiology No results found.  Procedures Procedures (including critical care time)  Medications Ordered in ED Medications  sodium chloride 0.9 % bolus 1,000 mL (has no administration in time range)    ED Course  I have reviewed the triage vital signs and the nursing notes.  Pertinent labs & imaging results that were available during my care of the  patient were reviewed by me and considered in my medical decision making (see chart for details).    MDM Rules/Calculators/A&P                      Benjamin [name] is a 39 y.o. male here with altered mental status.  Is slow to respond but otherwise ANO x3.  Has nonfocal neuro exam and no signs of head injury.  He does have a history of drug and alcohol use. Will check CT head and labs and tox and chest x-ray and urinalysis to rule out organic causes.  Will  hydrate and reassess.  10:21 AM Wife has arrived and gave me additional history he appears to be in catatonic state this morning.  Been very anxious recently has been having a lot of stress at work and home.  Will consult TTS.  11:11 AM Patient refused CT head. Completely awake and alert. No headaches or weight loss or head injury. I think his AMS is likely psychiatric and I canceled CT head. Consulted TTS to see patient   12:27 PM Psych saw patient and felt that he is stable for discharge.  Labs are unremarkable.  He is simply awake and alert.  Continues to denies any suicidal homicidal ideation.  Final Clinical Impression(s) / ED Diagnoses Final diagnoses:  None    Rx / DC Orders ED Discharge Orders    None       Charlynne Pander, MD 10/27/19 1227

## 2019-10-27 NOTE — ED Notes (Signed)
All appropriate discharge materials reviewed at length with patient. Time for questions provided. Pt has no other questions at this time and verbalizes understanding of all provided materials.  

## 2019-10-27 NOTE — Consult Note (Addendum)
Telepsych Consultation   Reason for Consult:  altered mental status.  Referring Physician:  EDP Location of Patient: [facility] Location of Provider: [facility]  Patient Identification: Noboru Bidinger MRN:  [id number] Principal Diagnosis: Altered mental status, unspecified Diagnosis:  Principal Problem:   Altered mental status, unspecified   Total Time spent with patient: 15 minutes  Subjective:  Barak Bialecki is a 39 y.o. male patient admitted with altered mental straus.    HPI:  Abdallah Hern is a 39 y.o. male history of depression, PTSD, previous GI bleed here presenting with altered mental status.  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  He woke up this morning and was trying to get to work but wife noticed that he appears very confused.  He appears dazed for about an hour or so so she called EMS and brought him over here.  EMS noticed normal vitals and normal CBG.  Patient has no complaints.  He denies drug use or alcohol use.  Denies any vomiting or diarrhea.   Per the wife, patient has been behaving erratically recently.  He has been sobbing uncontrollably.  He also got lost yesterday and called her frantically.  He appears to be in a catatonic state this morning.  He adamantly denies suicidal or homicidal ideations.  He had previous psych admission.  Patient also is on Abilify injections once a month.  Psychiatry evaluation: Dary Dilauro is a 39 y.o. male who presented to [facility] following altered mental status. During this evaluation, he is alert and oriented x4, calm and cooperative. He presented with a PMH of MDD with psychotic features and PTSD per chart review. He added that he is currently taking Abilify for mood stabilization prescribed by his outpatient psychiatrist. Patient reported secondary to working as a Administrator, he had been experiencing  back pain. Reported yesterday, he took one oxycodone pill which he got from a friend.  Reported today, when he woke up, he was dazed and confused. Reported his wife observed his confusion, became concerned, and called EMS. Reported he had taken oxycodone in the past proscribed for pain but he had not done so in years. He denied other substance abuse or use with the oxycodone and further denied history thereof. Per review of chart, patient reported to TTS counselor that he also ingested 1 Klonopin yesterday for "nerves." His UDS was not resulted prior to this evaluation. Patient denied active or passive suicidal thoughts, homicidal thoughts or current psychosis. He denied prior suicide attempts or self-harming acts.  As previously stated, he receives an Abilify injection monthly at [facility] and he reports he does participated in outpatient therapy.    Patient gave verbal consent for TTS to speak with his wife, [name]. Per counselors note; Per Lilian Coma [phone]: Patient acting erratically for several days. Last night he called her crying because he said he was lost. She states he was rambling and sounded like he was having a panic attack. This morning patient found outside the home passed out by the car with a burning cigarette. He appeared catatonic. Patient has a history of substance use and mental illness. He received ECT in 2018 at [facility]. Wife states she does not believe husband is suicidal or homicidal but states this is not his normal presentation. She states he has been under a lot of stress with work and Covid.     Past Psychiatric History: MDD with psychotic features and PTSD.  Risk to Self: Suicidal Ideation:  No Suicidal Intent: No Is patient at risk for suicide?: No Suicidal Plan?: No Access to Means: No What has been your use of drugs/alcohol within the last 12 months?: denies How many times?: 0 Other Self Harm Risks: none Triggers for Past Attempts: None known Intentional Self Injurious Behavior: None Risk to Others: Homicidal Ideation: No Thoughts of  Harm to Others: No Current Homicidal Intent: No Current Homicidal Plan: No Access to Homicidal Means: No Identified Victim: none History of harm to others?: No Assessment of Violence: None Noted Violent Behavior Description: none Does patient have access to weapons?: No Criminal Charges Pending?: No Does patient have a court date: No Prior Inpatient Therapy: Prior Inpatient Therapy: Yes Prior Therapy Dates: 2018 Prior Therapy Facilty/Provider(s): [facility] Reason for Treatment: depression with psychosis Prior Outpatient Therapy: Prior Outpatient Therapy: Yes Prior Therapy Dates: ongoing [facility] Reason for Treatment: med management and therapy Does patient have an ACCT team?: No Does patient have Intensive In-House Services?  : No Does patient have [facility] services? : No Does patient have P4CC services?: No  Past Medical History:  Past Medical History:  Diagnosis Date  . Depression   . Hematemesis 08/2017  . Herniated disc    x3  . Hypertension   . Mental disorder     Past Surgical History:  Procedure Laterality Date  . ESOPHAGOGASTRODUODENOSCOPY Left 08/27/2017   Procedure: ESOPHAGOGASTRODUODENOSCOPY (EGD);  Surgeon: Jeani Hawking, MD;  Location: [facility] ENDOSCOPY;  Service: Endoscopy;  Laterality: Left;  . SHOULDER SURGERY    . WISDOM TOOTH EXTRACTION     Family History:  Family History  Problem Relation Age of Onset  . Seizures Mother   . Stroke Mother   . Diabetes Mellitus II Mother    Family Psychiatric  History: None noted per chart review  Social History:  Social History   Substance and Sexual Activity  Alcohol Use No     Social History   Substance and Sexual Activity  Drug Use Yes  . Types: Marijuana    Social History   Socioeconomic History  . Marital status: Single    Spouse name: Not on file  . Number of children: Not on file  . Years of education: Not on file  . Highest education level: Not on file   Occupational History  . Not on file  Tobacco Use  . Smoking status: Former Smoker    Packs/day: 1.00    Types: Cigarettes    Quit date: 04/04/2012    Years since quitting: 7.5  . Smokeless tobacco: Never Used  . Tobacco comment: Pt is nonverbal. Unknown.   Substance and Sexual Activity  . Alcohol use: No  . Drug use: Yes    Types: Marijuana  . Sexual activity: Never  Other Topics Concern  . Not on file  Social History Narrative  . Not on file   Social Determinants of Health   Financial Resource Strain:   . Difficulty of Paying Living Expenses:   Food Insecurity:   . Worried About Programme researcher, broadcasting/film/video in the Last Year:   . Barista in the Last Year:   Transportation Needs:   . Freight forwarder (Medical):   Marland Kitchen Lack of Transportation (Non-Medical):   Physical Activity:   . Days of Exercise per Week:   . Minutes of Exercise per Session:   Stress:   . Feeling of Stress :   Social Connections:   . Frequency of Communication with Friends and  Family:   . Frequency of Social Gatherings with Friends and Family:   . Attends Religious Services:   . Active Member of Clubs or Organizations:   . Attends Archivist Meetings:   Marland Kitchen Marital Status:    Additional Social History:    Allergies:  No Known Allergies  Labs:  Results for orders placed or performed during the hospital encounter of 10/27/19 (from the past 48 hour(s))  CBC with Differential/Platelet     Status: None   Collection Time: 10/27/19  8:56 AM  Result Value Ref Range   WBC 8.4 4.0 - 10.5 K/uL   RBC 5.49 4.22 - 5.81 MIL/uL   Hemoglobin 16.6 13.0 - 17.0 g/dL   HCT 49.8 39.0 - 52.0 %   MCV 90.7 80.0 - 100.0 fL   MCH 30.2 26.0 - 34.0 pg   MCHC 33.3 30.0 - 36.0 g/dL   RDW 12.6 11.5 - 15.5 %   Platelets 213 150 - 400 K/uL   nRBC 0.0 0.0 - 0.2 %   Neutrophils Relative % 70 %   Neutro Abs 6.0 1.7 - 7.7 K/uL   Lymphocytes Relative 20 %   Lymphs Abs 1.7 0.7 - 4.0 K/uL   Monocytes Relative 7 %    Monocytes Absolute 0.6 0.1 - 1.0 K/uL   Eosinophils Relative 2 %   Eosinophils Absolute 0.1 0.0 - 0.5 K/uL   Basophils Relative 1 %   Basophils Absolute 0.0 0.0 - 0.1 K/uL   Immature Granulocytes 0 %   Abs Immature Granulocytes 0.02 0.00 - 0.07 K/uL    Comment: Performed at [facility], [address], [place], [place] [address]  Comprehensive metabolic panel     Status: None   Collection Time: 10/27/19  8:56 AM  Result Value Ref Range   Sodium 138 135 - 145 mmol/L   Potassium 4.2 3.5 - 5.1 mmol/L   Chloride 105 98 - 111 mmol/L   CO2 23 22 - 32 mmol/L   Glucose, Bld 93 70 - 99 mg/dL    Comment: Glucose reference range applies only to samples taken after fasting for at least 8 hours.   BUN 16 6 - 20 mg/dL   Creatinine, Ser 0.93 0.61 - 1.24 mg/dL   Calcium 9.1 8.9 - 10.3 mg/dL   Total Protein 7.0 6.5 - 8.1 g/dL   Albumin 4.2 3.5 - 5.0 g/dL   AST 26 15 - 41 U/L   ALT 35 0 - 44 U/L   Alkaline Phosphatase 43 38 - 126 U/L   Total Bilirubin 0.6 0.3 - 1.2 mg/dL   GFR calc non Af Amer >60 >60 mL/min   GFR calc Af Amer >60 >60 mL/min   Anion gap 10 5 - 15    Comment: Performed at [facility], [place] [address] [place], [place] [address]  Troponin I (High Sensitivity)     Status: None   Collection Time: 10/27/19  8:56 AM  Result Value Ref Range   Troponin I (High Sensitivity) 4 <18 ng/L    Comment: (NOTE) Elevated high sensitivity troponin I (hsTnI) values and significant  changes across serial measurements may suggest ACS but many other  chronic and acute conditions are known to elevate hsTnI results.  Refer to the "Links" section for chest pain algorithms and additional  guidance. Performed at [facility], [place] [address], [place], [place] [address]   Ethanol     Status: None   Collection Time: 10/27/19  8:56 AM  Result Value Ref Range   Alcohol, Ethyl (B) <10 <10 mg/dL    Comment: (NOTE) Lowest detectable limit for serum alcohol is 10 mg/dL. For medical  purposes only. Performed at [facility], [address] [place], [place] [address]   Salicylate level     Status: Abnormal   Collection Time: 10/27/19  8:56 AM  Result Value Ref Range   Salicylate Lvl <7.0 (L) 7.0 - 30.0 mg/dL    Comment: Performed at [facility], [address], [place], [place] [address]  Acetaminophen level     Status: Abnormal   Collection Time: 10/27/19  8:56 AM  Result Value Ref Range   Acetaminophen (Tylenol), Serum <10 (L) 10 - 30 ug/mL    Comment: (NOTE) Therapeutic concentrations vary significantly. A range of 10-30 ug/mL  may be an effective concentration for many patients. However, some  are best treated at concentrations outside of this range. Acetaminophen concentrations >150 ug/mL at 4 hours after ingestion  and >50 ug/mL at 12 hours after ingestion are often associated with  toxic reactions. Performed at [facility], [address] [place], [place] [address]   CBG monitoring, ED     Status: None   Collection Time: 10/27/19  9:03 AM  Result Value Ref Range   Glucose-Capillary 87 70 - 99 mg/dL    Comment: Glucose reference range applies only to samples taken after fasting for at least 8 hours.  Troponin I (High Sensitivity)     Status: None   Collection Time: 10/27/19 11:02 AM  Result Value Ref Range   Troponin I (High Sensitivity) 4 <18 ng/L    Comment: (NOTE) Elevated high sensitivity troponin I (hsTnI) values and significant  changes across serial measurements may suggest ACS but many other  chronic and acute conditions are known to elevate hsTnI results.  Refer to the "Links" section for chest pain algorithms and additional  guidance. Performed at [facility], [address], [place], [place] [address]     Medications:  No current facility-administered medications for this encounter.   Current Outpatient Medications  Medication Sig Dispense Refill  . ARIPiprazole (ABILIFY) 9.75 MG/1.3ML injection Inject  15 mg into the muscle every 30 (thirty) days.    . fluvoxaMINE (LUVOX) 100 MG tablet Take 1 tablet (100 mg total) by mouth 2 (two) times daily. 60 tablet 1  . pantoprazole (PROTONIX) 40 MG tablet Take 1 tablet (40 mg total) by mouth 2 (two) times daily. Take twice a day before meal for a month and then once a day. 60 tablet 0    Musculoskeletal: Unable to access as evaluation via telepsych   Psychiatric Specialty Exam: Physical Exam  Nursing note reviewed. Constitutional: He is oriented to person, place, and time.  Neurological: He is alert and oriented to person, place, and time.    Review of Systems  Blood pressure (!) 134/94, pulse 81, resp. rate 16, SpO2 97 %.There is no height or weight on file to calculate BMI.  General Appearance: Fairly Groomed  Eye Contact:  Fair  Speech:  Clear and Coherent and Normal Rate  Volume:  Normal  Mood:  Anxious  Affect:  Congruent  Thought Process:  Coherent, Linear and Descriptions of Associations: Intact  Orientation:  Full (Time, Place, and Person)  Thought Content:  Logical  Suicidal Thoughts:  No  Homicidal Thoughts:  No  Memory:  Immediate;   Fair Recent;   Fair  Judgement:  Impaired  Insight:  limitied   Psychomotor Activity:  Normal  Concentration:  Concentration: Fair and Attention Span: Fair  Recall:  FiservFair  Fund of Knowledge:  Fair  Language:  Good  Akathisia:  Negative  Handed:  Right  AIMS (if indicated):     Assets:  Communication Skills Desire for Improvement Resilience Social Support  ADL's:  Intact  Cognition:  WNL  Sleep:        Treatment Plan Summary: Daily contact with patient to assess and evaluate symptoms and progress in treatment  Disposition:  Patient denies current SI, HI or psychosis. He presents with normal speech and his thoughts are organized however, he has limited  insight. There are no signs that patient is psychotic and responding  to internal stimuli. He has a PMH of depression, PTSD, and mood  disorder and he [facility] He also reports that he does have outpatient therapy. Based off of my devaluation and review of collateral information, there is no evidence of imminent risk to self or others at present.  Patient does not meet criteria for psychiatric inpatient admission an dis psychiatrically cleared. I do reccommended that he continue follow-up with his outpatient team..    ED nurse updated on current disposition by TTS counselor.   This service was provided via telemedicine using a 2-way, interactive audio and video technology.  Names of all persons participating in this telemedicine service and their role in this encounter. Name: Denzil MagnusonLaShunda Gaberial Cada  Role: FNP-C  Name: Cherly HensenJoshua Anello  Role: Patient    Denzil MagnusonLaShunda Jancie Kercher, NP 10/27/2019 12:43 PM

## 2019-10-27 NOTE — Discharge Instructions (Signed)
Your confusion may be related to not sleeping enough and stress.   Continue your current meds   Please schedule your monthly injection with your psychiatrist   See your primary care doctor   Return to ER if you have worse confusion, thoughts of harming herself or others, hallucinations.

## 2019-10-27 NOTE — ED Triage Notes (Signed)
Pt bib ems from home with AMS and lethargy. Per ems, on scene pt alert to self only. Unable to keep pt awake for full assessment on scene. On arrival to the ED, pt alert and oriented X4. Per wife, yesterday pt had another episode of confusion lasting approx 1 hour.  144/94 HR 94 RR 18 100% RA CBG 111

## 2019-10-27 NOTE — BH Assessment (Signed)
Tele Assessment Note   Patient Name: Benjamin [name] MRN: [id number] Referring Physician: Silverio Lay Location of Patient: [facility] ED Location of Provider: Behavioral Health TTS Department  Benjamin [name] is an 39 y.o. male presenting voluntarily to [facility] ED via EMS due to AMS and lethargy. Upon this counselor's exam patient is alert and oriented x 4. He is calm and cooperative during assessment process. However, ED reports some agitation requiring IM Ativan. Patient states last night he took 1 Oxycodone to help him sleep and when he woke up EMS was at his house. He also reports ingesting 1 Klonopin yesterday for "nerves" but denies any other substance use. UDS not completed at time of assessment. He denies SI/HI/AVH. Patient reports he receives an Abilify injection monthly at [facility]. Patient has a history of MDD with psychotic features. Patient gives verbal consent for TTS to speak with his wife, Benjamin [name].  Per Lilian Coma [phone]: Patient acting erratically for several days. Last night he called her crying because he said he was lost. She states he was rambling and sounded like he was having a panic attack. This morning patient found outside the home passed out by the car with a burning cigarette. He appeared catatonic. Patient has a history of substance use and mental illness. He received ECT in 2018 at [facility]. Wife states she does not believe husband is suicidal or homicidal but states this is not his normal presentation. She states he has been under a lot of stress with work and Covid.   Patient is alert and oriented x 4. He is dressed appropriately, sitting upright in bed. His speech is logical, eye contact is good, and his thoughts are organized. His mood is anxious and his affect is congruent. He has limited insight, judgement, and impulse control. He does not appear to be responding to internal stimuli or experiencing delusional thought content.   Diagnosis: F33.2 MDD, recurrent, severe  Past  Medical History:  Past Medical History:  Diagnosis Date  . Depression   . Hematemesis 08/2017  . Herniated disc    x3  . Hypertension   . Mental disorder     Past Surgical History:  Procedure Laterality Date  . ESOPHAGOGASTRODUODENOSCOPY Left 08/27/2017   Procedure: ESOPHAGOGASTRODUODENOSCOPY (EGD);  Surgeon: Jeani Hawking, MD;  Location: [facility] ENDOSCOPY;  Service: Endoscopy;  Laterality: Left;  . SHOULDER SURGERY    . WISDOM TOOTH EXTRACTION      Family History:  Family History  Problem Relation Age of Onset  . Seizures Mother   . Stroke Mother   . Diabetes Mellitus II Mother     Social History:  reports that he quit smoking about 7 years ago. His smoking use included cigarettes. He smoked 1.00 pack per day. He has never used smokeless tobacco. He reports current drug use. Drug: Marijuana. He reports that he does not drink alcohol.  Additional Social History:  Alcohol / Drug Use Pain Medications: see MAR Prescriptions: see MAR Over the Counter: see MAR History of alcohol / drug use?: No history of alcohol / drug abuse  CIWA: CIWA-Ar BP: (!) 144/90 Pulse Rate: 91 COWS:    Allergies: No Known Allergies  Home Medications: (Not in a hospital admission)   OB/GYN Status:  No LMP for male patient.  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[place] Pregnancy Status: No Living Arrangements: Spouse/significant other, Children Can pt return to current living arrangement?: Yes Admission Status: Voluntary Is patient capable of signing voluntary admission?: Yes Referral Source: Self/Family/Friend Insurance type:  [place] Living Arrangements: Spouse/significant other, Children Legal Guardian: (self) Name of Psychiatrist: [facility] Name of Therapist: [facility] [employer]  Education Status Is patient currently in school?: No Is the patient employed, unemployed or receiving disability?: Employed  Risk to self with the past 6 months Suicidal Ideation: No Has patient been a risk to self within the past 6 months prior to admission? : No Suicidal Intent: No Has patient had any suicidal intent within the past 6 months prior to admission? : No Is patient at risk for suicide?: No Suicidal Plan?: No Has patient had any suicidal plan within the past 6 months prior to admission? : No Access to Means: No What has been your use of drugs/alcohol within the last 12 months?: denies Previous Attempts/Gestures: No How many times?: 0 Other Self Harm Risks: none Triggers for Past Attempts: None known Intentional Self Injurious Behavior: None Family Suicide History: No Recent stressful life event(s): (work stress) Persecutory voices/beliefs?: No Depression: Yes Depression Symptoms: Feeling angry/irritable, Loss of interest in usual pleasures, Fatigue, Isolating, Tearfulness, Despondent Substance abuse history and/or treatment for substance abuse?: No Suicide prevention information given to non-admitted patients: Not applicable  Risk to Others within the past 6 months Homicidal Ideation: No Does patient have any lifetime risk of violence toward others beyond the six months prior to admission? : No Thoughts of Harm to Others: No Current Homicidal Intent: No Current Homicidal Plan: No Access to Homicidal Means: No Identified Victim: none History of harm to others?: No Assessment of Violence: None Noted Violent Behavior Description: none Does patient have access to weapons?: No Criminal Charges Pending?: No Does patient have a court date: No Is  patient on probation?: No  Psychosis Hallucinations: None noted Delusions: None noted  Mental Status Report Appearance/Hygiene: Unremarkable Eye Contact: Good Motor Activity: Freedom of movement Speech: Logical/coherent Level of Consciousness: Alert Mood: Irritable Affect: Irritable Anxiety Level: Moderate Thought Processes: Coherent, Relevant Judgement: Partial Orientation: Person, Place, Time, Situation Obsessive Compulsive Thoughts/Behaviors: None  Cognitive Functioning Concentration: Normal Memory: Recent Intact, Remote Intact Is patient IDD: No Insight: Fair Impulse Control: Fair Appetite: Good Have you had any weight changes? : No Change Sleep: Increased Total Hours of Sleep: (UTA) Vegetative Symptoms: None  ADLScreening [facility] Assessment Services) Patient's cognitive ability adequate to safely complete daily activities?: Yes Patient able to express need for assistance with ADLs?: Yes Independently performs ADLs?: Yes (appropriate for developmental age)  Prior Inpatient Therapy Prior Inpatient Therapy: Yes Prior Therapy Dates: 2018 Prior Therapy Facilty/Provider(s): [facility] Reason for Treatment: depression with psychosis  Prior Outpatient Therapy Prior Outpatient Therapy: Yes Prior Therapy Dates: ongoing [facility] Reason for Treatment: med management and therapy Does patient have an ACCT team?: No Does patient have Intensive In-House Services?  : No Does patient have [facility] services? : No Does patient have P4CC services?: No  ADL Screening (condition at time of admission) Patient's cognitive ability adequate to safely complete daily activities?: Yes Is the patient deaf or have difficulty hearing?: No Does the patient have difficulty seeing, even when wearing glasses/contacts?: No Does the patient have difficulty concentrating, remembering, or  making decisions?: No Patient able to express need for assistance with  ADLs?: Yes Does the patient have difficulty dressing or bathing?: No Independently performs ADLs?: Yes (appropriate for developmental age) Does the patient have difficulty walking or climbing stairs?: No Weakness of Legs: None Weakness of Arms/Hands: None  Home Assistive Devices/Equipment Home Assistive Devices/Equipment: None  Therapy Consults (therapy consults require a physician order) PT Evaluation Needed: No OT Evalulation Needed: No SLP Evaluation Needed: No Abuse/Neglect Assessment (Assessment to be complete while patient is alone) Abuse/Neglect Assessment Can Be Completed: Yes Physical Abuse: Denies Verbal Abuse: Denies Sexual Abuse: Denies Exploitation of patient/patient's resources: Denies Self-Neglect: Denies Values / Beliefs Cultural Requests During Hospitalization: None Spiritual Requests During Hospitalization: None Consults Spiritual Care Consult Needed: No Transition of Care Team Consult Needed: No Advance Directives (For Healthcare) Does Patient Have a Medical Advance Directive?: Yes Does patient want to make changes to medical advance directive?: No - Patient declined Type of Advance Directive: Healthcare Power of Attorney Copy of Healthcare Power of Attorney in Chart?: Yes - validated most recent copy scanned in chart (See row information)          Disposition: Per Denzil Magnuson, NP patient is psych cleared to follow up with outpatient providers. Phil, RN notified of disposition.  Disposition Initial Assessment Completed for this Encounter: Yes  This service was provided via telemedicine using a 2-way, interactive audio and video technology.  Names of all persons participating in this telemedicine service and their role in this encounter. Name: [name] Role: patient  Name: Celedonio Miyamoto, LCSW Role: TTS  Name: Denzil Magnuson, NP Role: Provider  Name:  Role:     Celedonio Miyamoto 10/27/2019 12:09 PM

## 2020-08-30 IMAGING — DX DG CHEST 2V
2 series · 2 of 2 positions shown · non-contrast
Comparison: August 26, 2017

CLINICAL DATA: Confusion and lethargy

EXAM:
CHEST - 2 VIEW

[chest pa]
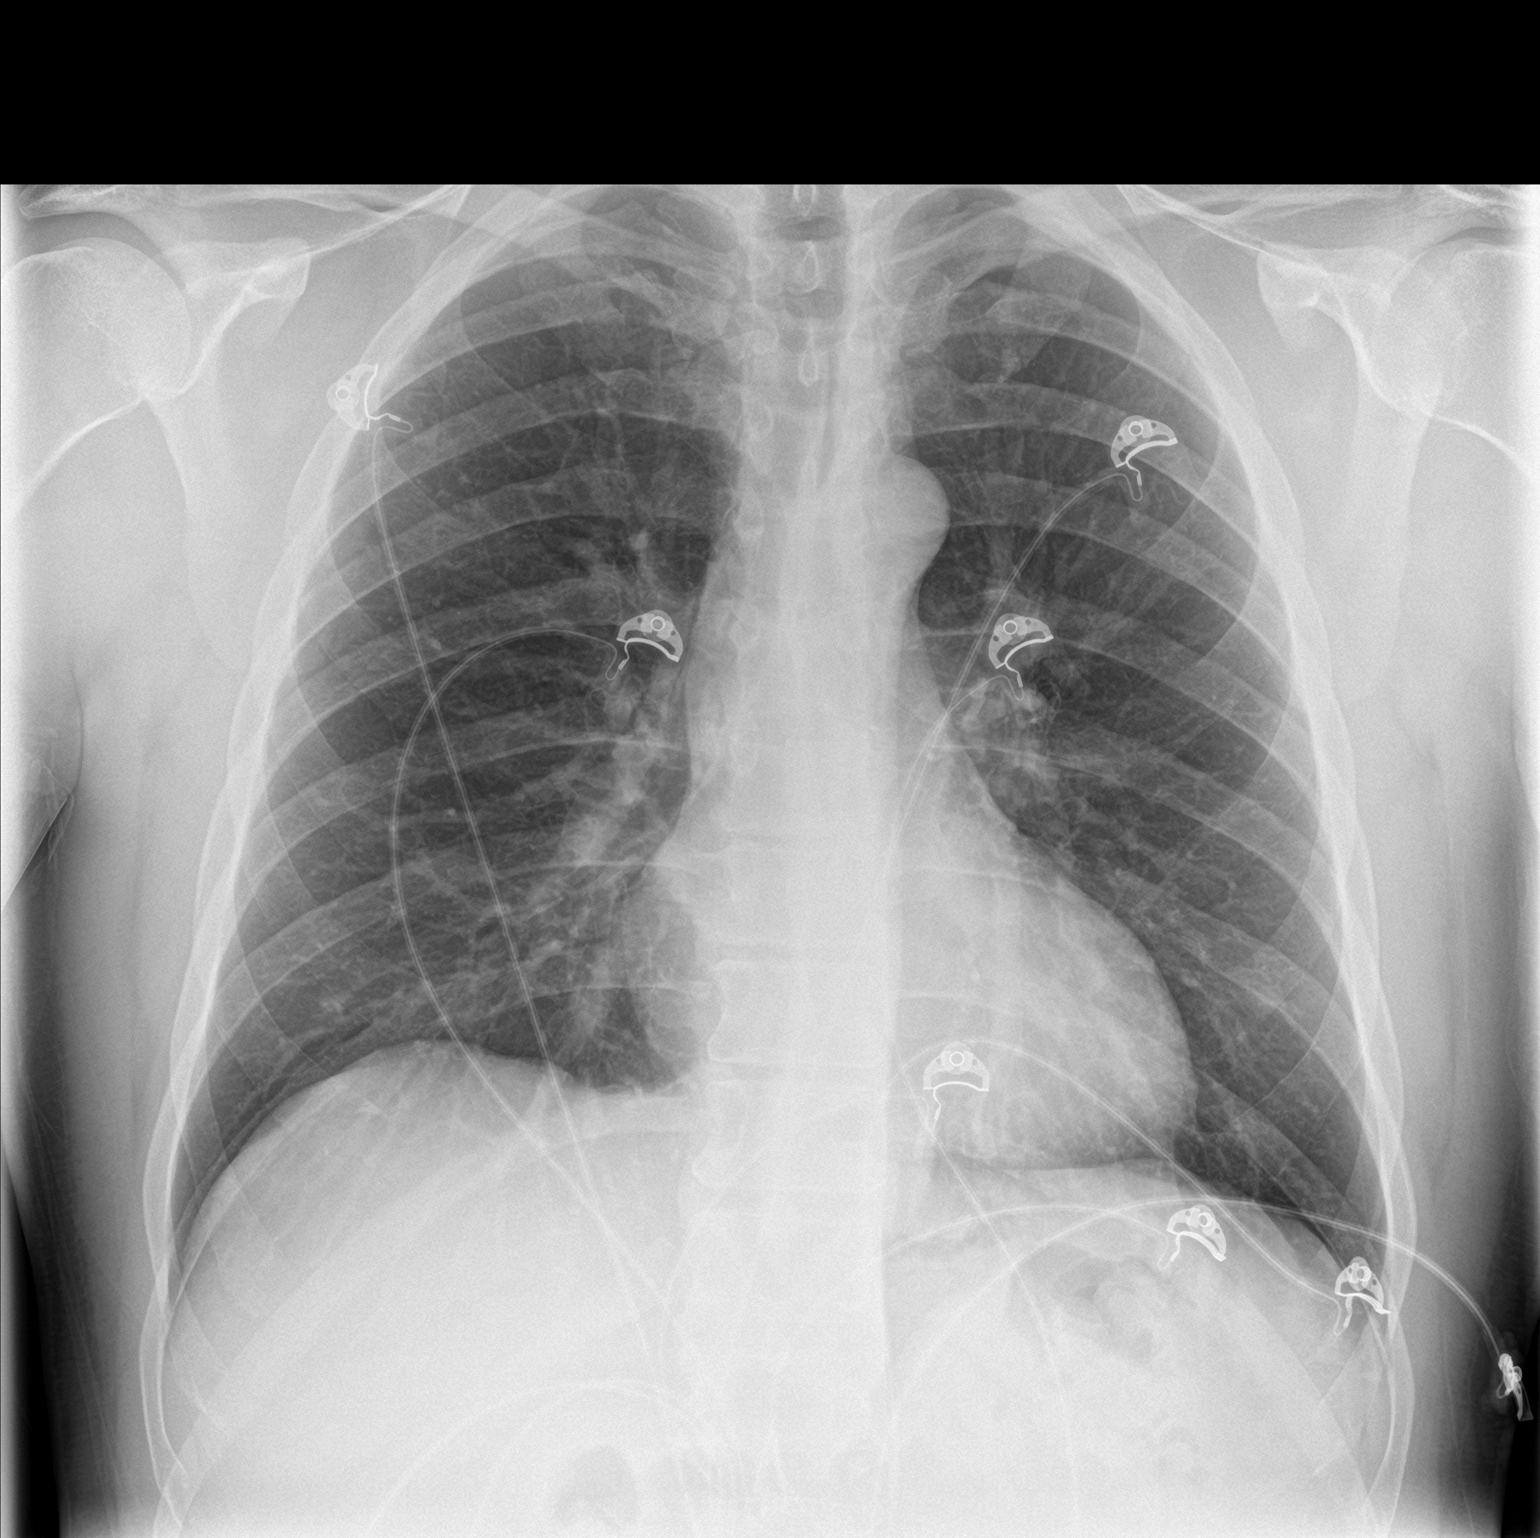

[chest lat]
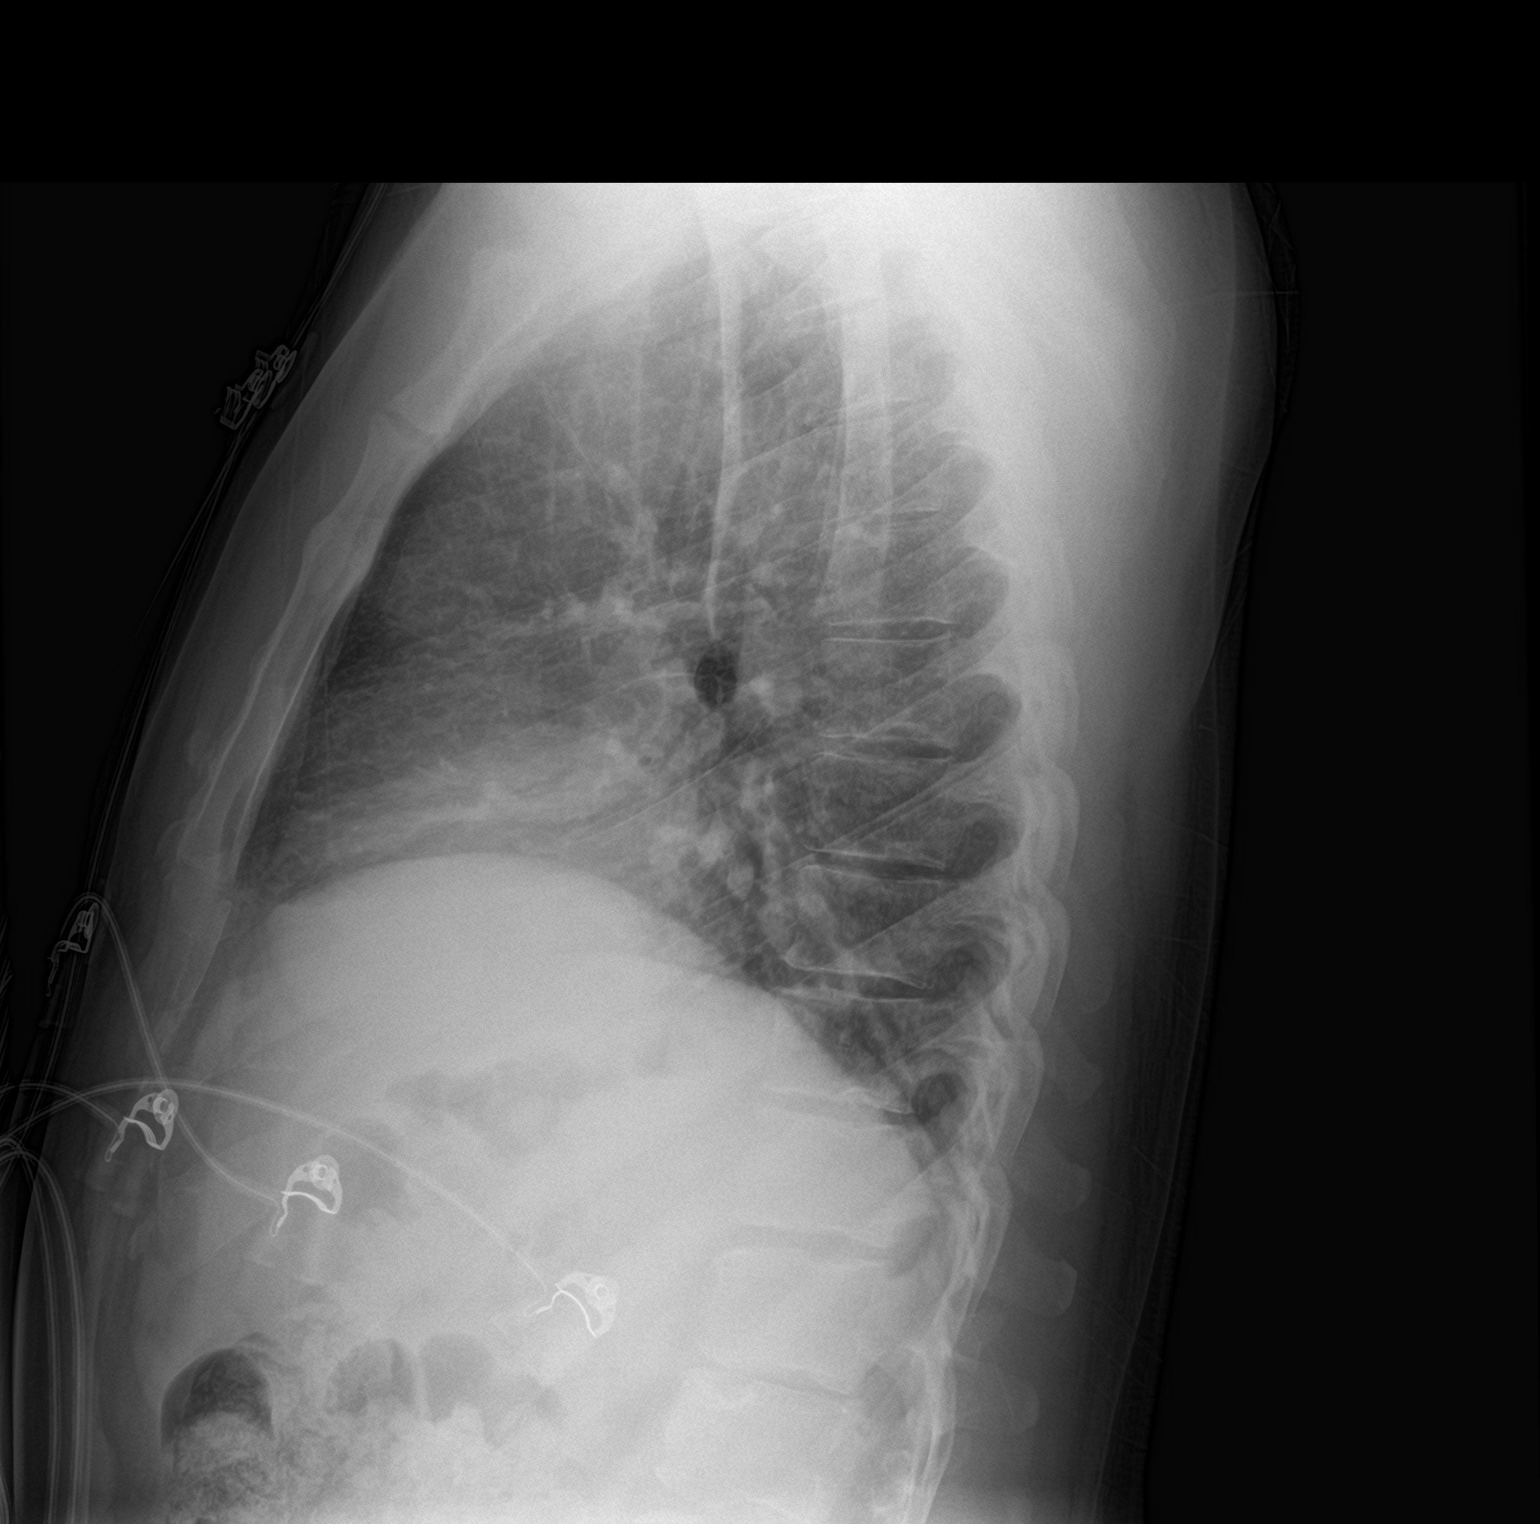

[2 of 2 positions shown; findings below may reference images not displayed]

FINDINGS: There are suspected nipple shadows bilaterally. Lungs elsewhere
clear. The heart size and pulmonary vascularity are normal. No
adenopathy. No bone lesions
IMPRESSION: Suspected nipple shadows bilaterally. Repeat study with nipple
markers could confirm. Lungs otherwise clear. Cardiac silhouette
within normal limits.

## 2024-01-28 ENCOUNTER — Observation Stay (HOSPITAL_COMMUNITY)
Admission: EM | Admit: 2024-01-28 | Discharge: 2024-02-02 | Disposition: A | Discharge status: Home / Self Care | Payer: Self-pay | Attending: Internal Medicine | Admitting: Internal Medicine

## 2024-01-28 ENCOUNTER — Other Ambulatory Visit: Payer: Self-pay

## 2024-01-28 ENCOUNTER — Encounter (HOSPITAL_COMMUNITY): Payer: Self-pay

## 2024-01-28 DIAGNOSIS — F23 Brief psychotic disorder: Secondary | ICD-10-CM | POA: Diagnosis not present

## 2024-01-28 DIAGNOSIS — F1290 Cannabis use, unspecified, uncomplicated: Secondary | ICD-10-CM | POA: Insufficient documentation

## 2024-01-28 DIAGNOSIS — E860 Dehydration: Secondary | ICD-10-CM | POA: Insufficient documentation

## 2024-01-28 DIAGNOSIS — N179 Acute kidney failure, unspecified: Secondary | ICD-10-CM | POA: Diagnosis not present

## 2024-01-28 DIAGNOSIS — E871 Hypo-osmolality and hyponatremia: Secondary | ICD-10-CM | POA: Diagnosis not present

## 2024-01-28 DIAGNOSIS — F22 Delusional disorders: Secondary | ICD-10-CM | POA: Diagnosis present

## 2024-01-28 DIAGNOSIS — F4310 Post-traumatic stress disorder, unspecified: Secondary | ICD-10-CM | POA: Diagnosis not present

## 2024-01-28 DIAGNOSIS — Z87891 Personal history of nicotine dependence: Secondary | ICD-10-CM | POA: Insufficient documentation

## 2024-01-28 DIAGNOSIS — R630 Anorexia: Secondary | ICD-10-CM | POA: Diagnosis present

## 2024-01-28 DIAGNOSIS — F29 Unspecified psychosis not due to a substance or known physiological condition: Principal | ICD-10-CM

## 2024-01-28 DIAGNOSIS — Z79899 Other long term (current) drug therapy: Secondary | ICD-10-CM | POA: Insufficient documentation

## 2024-01-28 DIAGNOSIS — E870 Hyperosmolality and hypernatremia: Secondary | ICD-10-CM

## 2024-01-28 DIAGNOSIS — Z8659 Personal history of other mental and behavioral disorders: Secondary | ICD-10-CM | POA: Diagnosis not present

## 2024-01-28 LAB — AMMONIA: Ammonia: <13 umol/L (ref 9–35)

## 2024-01-28 LAB — CBC WITH DIFFERENTIAL/PLATELET
Abs Immature Granulocytes: 0.05 10*3/uL (ref 0.00–0.07)
Basophils Absolute: 0.1 10*3/uL (ref 0.0–0.1)
Basophils Relative: 1 %
Eosinophils Absolute: 0 10*3/uL (ref 0.0–0.5)
Eosinophils Relative: 0 %
HCT: 51.1 % (ref 39.0–52.0)
Hemoglobin: 14.3 g/dL (ref 13.0–17.0)
Immature Granulocytes: 0 %
Lymphocytes Relative: 15 %
Lymphs Abs: 1.9 10*3/uL (ref 0.7–4.0)
MCH: 20.5 pg — ABNORMAL LOW (ref 26.0–34.0)
MCHC: 28 g/dL — ABNORMAL LOW (ref 30.0–36.0)
MCV: 73.4 fL — ABNORMAL LOW (ref 80.0–100.0)
Monocytes Absolute: 0.7 10*3/uL (ref 0.1–1.0)
Monocytes Relative: 5 %
Neutro Abs: 9.6 10*3/uL — ABNORMAL HIGH (ref 1.7–7.7)
Neutrophils Relative %: 79 %
Platelets: 509 10*3/uL — ABNORMAL HIGH (ref 150–400)
RBC: 6.96 MIL/uL — ABNORMAL HIGH (ref 4.22–5.81)
RDW: 19.3 % — ABNORMAL HIGH (ref 11.5–15.5)
WBC: 12.3 10*3/uL — ABNORMAL HIGH (ref 4.0–10.5)
nRBC: 0 % (ref 0.0–0.2)

## 2024-01-28 LAB — I-STAT CHEM 8, ED
BUN: 56 mg/dL — ABNORMAL HIGH (ref 6–20)
Calcium, Ion: 1.11 mmol/L — ABNORMAL LOW (ref 1.15–1.40)
Chloride: 115 mmol/L — ABNORMAL HIGH (ref 98–111)
Creatinine, Ser: 1.9 mg/dL — ABNORMAL HIGH (ref 0.61–1.24)
Glucose, Bld: 120 mg/dL — ABNORMAL HIGH (ref 70–99)
HCT: 50 % (ref 39.0–52.0)
Hemoglobin: 17 g/dL (ref 13.0–17.0)
Potassium: 4.5 mmol/L (ref 3.5–5.1)
Sodium: 151 mmol/L — ABNORMAL HIGH (ref 135–145)
TCO2: 22 mmol/L (ref 22–32)

## 2024-01-28 LAB — COMPREHENSIVE METABOLIC PANEL WITH GFR
ALT: 24 U/L (ref 0–44)
AST: 33 U/L (ref 15–41)
Albumin: 5 g/dL (ref 3.5–5.0)
Alkaline Phosphatase: 50 U/L (ref 38–126)
Anion gap: 14 (ref 5–15)
BUN: 55 mg/dL — ABNORMAL HIGH (ref 6–20)
CO2: 23 mmol/L (ref 22–32)
Calcium: 9.9 mg/dL (ref 8.9–10.3)
Chloride: 112 mmol/L — ABNORMAL HIGH (ref 98–111)
Creatinine, Ser: 1.89 mg/dL — ABNORMAL HIGH (ref 0.61–1.24)
GFR, Estimated: 44 mL/min — ABNORMAL LOW (ref >60)
Glucose, Bld: 121 mg/dL — ABNORMAL HIGH (ref 70–99)
Potassium: 4.5 mmol/L (ref 3.5–5.1)
Sodium: 149 mmol/L — ABNORMAL HIGH (ref 135–145)
Total Bilirubin: 0.8 mg/dL (ref 0.0–1.2)
Total Protein: 9.1 g/dL — ABNORMAL HIGH (ref 6.5–8.1)

## 2024-01-28 LAB — HIV ANTIBODY (ROUTINE TESTING W REFLEX): HIV Screen 4th Generation wRfx: NONREACTIVE

## 2024-01-28 LAB — SALICYLATE LEVEL: Salicylate Lvl: <7 mg/dL — ABNORMAL LOW (ref 7.0–30.0)

## 2024-01-28 LAB — CBG MONITORING, ED: Glucose-Capillary: 113 mg/dL — ABNORMAL HIGH (ref 70–99)

## 2024-01-28 LAB — ETHANOL: Alcohol, Ethyl (B): <15 mg/dL (ref <15)

## 2024-01-28 LAB — ACETAMINOPHEN LEVEL: Acetaminophen (Tylenol), Serum: <10 ug/mL — ABNORMAL LOW (ref 10–30)

## 2024-01-28 LAB — TSH: TSH: 1.997 u[IU]/mL (ref 0.350–4.500)

## 2024-01-28 LAB — VITAMIN B12: Vitamin B-12: 1203 pg/mL — ABNORMAL HIGH (ref 180–914)

## 2024-01-28 MED ORDER — NICOTINE 21 MG/24HR TD PT24
21.0000 mg | MEDICATED_PATCH | Freq: Every day | TRANSDERMAL | Status: DC | PRN
Start: 2024-01-28 — End: 2024-02-02

## 2024-01-28 MED ORDER — DEXTROSE-SODIUM CHLORIDE 5-0.45 % IV SOLN: INTRAVENOUS | Status: AC

## 2024-01-28 MED ORDER — SENNOSIDES-DOCUSATE SODIUM 8.6-50 MG PO TABS: 1.0000 | ORAL_TABLET | Freq: Every evening | ORAL | Status: DC | PRN

## 2024-01-28 MED ORDER — ACETAMINOPHEN 650 MG RE SUPP: 650.0000 mg | Freq: Four times a day (QID) | RECTAL | Status: DC | PRN

## 2024-01-28 MED ORDER — OXYCODONE HCL 5 MG PO TABS: 5.0000 mg | ORAL_TABLET | ORAL | Status: DC | PRN

## 2024-01-28 MED ORDER — METOPROLOL TARTRATE 5 MG/5ML IV SOLN: 5.0000 mg | INTRAVENOUS | Status: DC | PRN

## 2024-01-28 MED ORDER — ENOXAPARIN SODIUM 40 MG/0.4ML IJ SOSY
40.0000 mg | PREFILLED_SYRINGE | INTRAMUSCULAR | Status: DC
  Filled 2024-01-28: qty 0.4

## 2024-01-28 MED ORDER — FLEET ENEMA RE ENEM: 1.0000 | ENEMA | Freq: Once | RECTAL | Status: DC | PRN

## 2024-01-28 MED ORDER — ONDANSETRON HCL 4 MG PO TABS
4.0000 mg | ORAL_TABLET | Freq: Four times a day (QID) | ORAL | Status: DC | PRN
Start: 2024-01-28 — End: 2024-02-02

## 2024-01-28 MED ORDER — BISACODYL 5 MG PO TBEC: 5.0000 mg | DELAYED_RELEASE_TABLET | Freq: Every day | ORAL | Status: DC | PRN

## 2024-01-28 MED ORDER — HYDRALAZINE HCL 20 MG/ML IJ SOLN: 10.0000 mg | INTRAMUSCULAR | Status: DC | PRN

## 2024-01-28 MED ORDER — ACETAMINOPHEN 325 MG PO TABS: 650.0000 mg | ORAL_TABLET | Freq: Four times a day (QID) | ORAL | Status: DC | PRN

## 2024-01-28 MED ORDER — HYDROMORPHONE HCL 1 MG/ML IJ SOLN: 0.5000 mg | INTRAMUSCULAR | Status: DC | PRN

## 2024-01-28 MED ORDER — ONDANSETRON HCL 4 MG/2ML IJ SOLN
4.0000 mg | Freq: Four times a day (QID) | INTRAMUSCULAR | Status: DC | PRN
Start: 2024-01-28 — End: 2024-02-02

## 2024-01-28 MED ORDER — SODIUM CHLORIDE 0.9 % IV SOLN: INTRAVENOUS | Status: DC

## 2024-01-28 NOTE — ED Notes (Signed)
 [employer] delivered the Findings and Custody order and served the patient.  The findings and custody order has been uploaded to the patient's chart, e-filed, and and recorded in the e-ivc binder.  3 copies were made and placed in the blue zone, 1 copy was placed in the red folder.

## 2024-01-28 NOTE — Progress Notes (Signed)
 Pt refused to have IV placed, stated I don't want that. Unable to provide IVF d/t no access. Will inform provider.

## 2024-01-28 NOTE — ED Notes (Signed)
 Introduced self to patient and informed patient he needed an IV placed for IVF. Patient refused IV in ED. Patient states, I want to do that upstairs.

## 2024-01-28 NOTE — ED Notes (Signed)
 Pt changing into scrubs now

## 2024-01-28 NOTE — ED Triage Notes (Addendum)
 Pt states has not been eating and drinking for 2 weeks. C/O n/v/d. Pt states he is on a mission from God. Denies SI and HI. Pt states he does not want to take any psych medication.

## 2024-01-28 NOTE — ED Notes (Signed)
 [employer] arrived to sign the paperwork for the findings and custody. I checked the fax machine and there was no custody and findings. I spoke with the officer and he is going to contact the magistrate to locate the officer with the paperwork.  I have been notified by [employer] that an officer is going by the magistrate's office to pick up the findings and custody order.

## 2024-01-28 NOTE — ED Notes (Signed)
 Pt has consumed 4 cup of water at this time. Pt stated that he has been fasting due to his religious beliefs and has not had anything to eat for at least 5 days now.

## 2024-01-28 NOTE — Progress Notes (Signed)
 Patient has been admitted from ED to 5C17 with IVC sitter. Alert and oriented. Vitals completed. Skin assessment completed - Right foot abrasion. No pain or other complaints at this time. Primary RN aware.

## 2024-01-28 NOTE — H&P (Signed)
 History and Physical    Benjamin [name] FMW:[id number] DOB: [birth year] DOA: 01/28/2024  PCP: Iktan Francisco, MD Patient coming from: Home  Chief Complaint: Poor appetite  HPI: Benjamin [name] is a 43 y.o. male with medical history significant of depression, psychosis, PTSD, OCD, marijuana use admitted to the hospital by the family due to mental health concerns.  According to the family and the patient he has not eaten anything in over 2 weeks and has lost some weight.  Patient keeps telling me that he is on admission from God not to eat and drink.  He does not have any other complaints.  Does not provide any other history.  Denies taking any medications at home.  In the ER routine blood work showed hyponatremia, AKI and clinical signs of dehydration.  He is being admitted to the hospital.  Psychiatry provider has been consulted.  EDP has also IVC this patient  Review of Systems: As per HPI otherwise 10 point review of systems negative.  Review of Systems Otherwise negative except as per HPI, including: General: Denies fever, chills, night sweats or unintended weight loss. Resp: Denies cough, wheezing, shortness of breath. Cardiac: Denies chest pain, palpitations, orthopnea, paroxysmal nocturnal dyspnea. GI: Denies abdominal pain, nausea, vomiting, diarrhea or constipation GU: Denies dysuria, frequency, hesitancy or incontinence MS: Denies muscle aches, joint pain or swelling Neuro: Denies headache, neurologic deficits (focal weakness, numbness, tingling), abnormal gait Psych: Denies anxiety, depression, SI/HI/AVH Skin: Denies new rashes or lesions ID: Denies sick contacts, exotic exposures, travel  Past Medical History:  Diagnosis Date   Depression    Hematemesis 08/2017   Herniated disc    x3   Hypertension    Mental disorder     Past Surgical History:  Procedure Laterality Date   ESOPHAGOGASTRODUODENOSCOPY Left 08/27/2017   Procedure: ESOPHAGOGASTRODUODENOSCOPY (EGD);  Surgeon:  Rollin Dover, MD;  Location: [facility] ENDOSCOPY;  Service: Endoscopy;  Laterality: Left;   SHOULDER SURGERY     WISDOM TOOTH EXTRACTION      SOCIAL HISTORY:  reports that he quit smoking about 11 years ago. His smoking use included cigarettes. He has never used smokeless tobacco. He reports current drug use. Drug: Marijuana. He reports that he does not drink alcohol.  No Known Allergies  FAMILY HISTORY: Family History  Problem Relation Age of Onset   Seizures Mother    Stroke Mother    Diabetes Mellitus II Mother      Prior to Admission medications   Medication Sig Start Date End Date Taking? Authorizing Provider  pantoprazole  (PROTONIX ) 40 MG tablet Take 1 tablet (40 mg total) by mouth 2 (two) times daily. Take twice a day before meal for a month and then once a day. 08/28/17  Yes Dolan Mateo Larger, MD  amphetamine-dextroamphetamine (ADDERALL) 20 MG tablet Take 20 mg by mouth every morning. Patient not taking: Reported on 01/28/2024 12/15/23   [provider]  ARIPiprazole  (ABILIFY ) 9.75 MG/1.3ML injection Inject 15 mg into the muscle every 30 (thirty) days. Patient not taking: Reported on 01/28/2024    [provider]  clonazePAM (KLONOPIN) 0.5 MG tablet Take 0.5 mg by mouth 2 (two) times daily. Patient not taking: Reported on 01/28/2024    [provider]  fluvoxaMINE  (LUVOX ) 100 MG tablet Take 1 tablet (100 mg total) by mouth 2 (two) times daily. Patient not taking: Reported on 01/28/2024 01/16/17   Clapacs, Norleen DASEN, MD  lisdexamfetamine (VYVANSE) 70 MG capsule Take 70 mg by mouth every morning.    [provider]    Physical Exam: Vitals:   01/28/24 1401 01/28/24 1401  BP:  (!) 119/102  Pulse:  (!) 116  Resp:  (!) 22  Temp:  97.9 F (36.6 C)  SpO2:  98%  Height: 5' 10 (1.778 m)       Constitutional: NAD, calm, comfortable Eyes: PERRL, lids and conjunctivae normal ENMT: Mucous membranes are moist. Posterior pharynx clear of any exudate  or lesions.Normal dentition.  Neck: normal, supple, no masses, no thyromegaly Respiratory: clear to auscultation bilaterally, no wheezing, no crackles. Normal respiratory effort. No accessory muscle use.  Cardiovascular: Regular rate and rhythm, no murmurs / rubs / gallops. No extremity edema. 2+ pedal pulses. No carotid bruits.  Abdomen: no tenderness, no masses palpated. No hepatosplenomegaly. Bowel sounds positive.  Musculoskeletal: no clubbing / cyanosis. No joint deformity upper and lower extremities. Good ROM, no contractures. Normal muscle tone.  Skin: no rashes, lesions, ulcers. No induration Neurologic: CN 2-12 grossly intact. Sensation intact, DTR normal. Strength 5/5 in all 4.  Psychiatric: Normal judgment and insight. Alert and oriented x 3. Normal mood.    Body mass index is 27.71 kg/m.      Labs on Admission: I have personally reviewed following labs and imaging studies  CBC: Recent Labs  Lab 01/28/24 1416 01/28/24 1430  WBC 12.3*  --   NEUTROABS 9.6*  --   HGB 14.3 17.0  HCT 51.1 50.0  MCV 73.4*  --   PLT 509*  --    Basic Metabolic Panel: Recent Labs  Lab 01/28/24 1416 01/28/24 1430  NA 149* 151*  K 4.5 4.5  CL 112* 115*  CO2 23  --   GLUCOSE 121* 120*  BUN 55* 56*  CREATININE 1.89* 1.90*  CALCIUM 9.9  --    GFR: CrCl cannot be calculated (Unknown ideal weight.). Liver Function Tests: Recent Labs  Lab 01/28/24 1416  AST 33  ALT 24  ALKPHOS 50  BILITOT 0.8  PROT 9.1*  ALBUMIN 5.0   No results for input(s): LIPASE, AMYLASE in the last 168 hours. No results for input(s): AMMONIA  in the last 168 hours. Coagulation Profile: No results for input(s): INR, PROTIME in the last 168 hours. Cardiac Enzymes: No results for input(s): CKTOTAL, CKMB, CKMBINDEX, TROPONINI in the last 168 hours. BNP (last 3 results) No results for input(s): PROBNP in the last 8760 hours. HbA1C: No results for input(s): HGBA1C in the last 72  hours. CBG: Recent Labs  Lab 01/28/24 1606  GLUCAP 113*   Lipid Profile: No results for input(s): CHOL, HDL, LDLCALC, TRIG, CHOLHDL, LDLDIRECT in the last 72 hours. Thyroid Function Tests: No results for input(s): TSH, T4TOTAL, FREET4, T3FREE, THYROIDAB in the last 72 hours. Anemia Panel: No results for input(s): VITAMINB12, FOLATE, FERRITIN, TIBC, IRON, RETICCTPCT in the last 72 hours. Urine analysis:    Component Value Date/Time   COLORURINE AMBER (A) 07/16/2016 0745   APPEARANCEUR HAZY (A) 07/16/2016 0745   LABSPEC 1.025 07/16/2016 0745   PHURINE 6.0 07/16/2016 0745   GLUCOSEU NEGATIVE 07/16/2016 0745   HGBUR NEGATIVE 07/16/2016 0745   BILIRUBINUR NEGATIVE 07/16/2016 0745   KETONESUR NEGATIVE 07/16/2016 0745   PROTEINUR 30 (A) 07/16/2016 0745   UROBILINOGEN 0.2 09/07/2012 2130   NITRITE NEGATIVE 07/16/2016 0745   LEUKOCYTESUR NEGATIVE 07/16/2016 0745   Sepsis Labs: !!!!!!!!!!!!!!!!!!!!!!!!!!!!!!!!!!!!!!!!!!!! @LABRCNTIP (procalcitonin:4,lacticidven:4) )No results found for this or any previous visit (from the past 240 hours).   Radiological Exams on Admission: No results found.  Nutritional status  All  images have been reviewed by me personally.    Assessment/Plan Principal Problem:   AKI (acute kidney injury) (HCC) Active Problems:   PTSD (post-traumatic stress disorder)   Acute kidney injury - Baseline creatinine from 2021 0.9, admission creatinine 1.9.  This is prerenal in nature.  IV fluids.  Monitor urine output.  This should improve if not we will proceed with further workup.  Hyponatremia - In the setting of dehydration.  IV fluids has been ordered.  Acute psychosis History of PTSD/OCD/depression - Not taking any medication.  Psychiatry has been consulted.  Will check UA if patient provides it.  Check TSH, ammonia , UDS, B12, folate  DVT prophylaxis: Lovenox Code Status: Full code Family Communication: Girl friend at  bedside Consults called: Psychiatry Admission status: Observation  Status is: Observation The patient remains OBS appropriate and will d/c before 2 midnights.   Time Spent: 65 minutes.  >50% of the time was devoted to discussing the patients care, assessment, plan and disposition with other care givers along with counseling the patient about the risks and benefits of treatment.    Benjamin [name] Dare MD [facility]  If 7PM-7AM, please contact night-coverage   01/28/2024, 5:37 PM

## 2024-01-28 NOTE — ED Notes (Signed)
 Case Number: [id number]

## 2024-01-28 NOTE — ED Provider Notes (Signed)
 [place] EMERGENCY DEPARTMENT AT [facility] Provider Note   CSN: [id number] Arrival date & time: 01/28/24  1343     Patient presents with: Delusional (/) and Psychiatric Evaluation (IVC/)   Benjamin [name] is a 43 y.o. male.   43 year old male with a past medical history of depression and psychosis brought to the emergency department today for refusal to eat or drink for several days.  Unclear exactly how long it has been since he last ate but family noted he could have been up to 2 weeks.  Patient is unable to clearly explain why he is refusing food other than stating he is on a mission from God.  He has been seen here previously around 3 years ago and evaluated by psychiatry due to his erratic behavior at that time.  He reports he has not been taking any antipsychotic medications.        Prior to Admission medications   Medication Sig Start Date End Date Taking? Authorizing Provider  pantoprazole  (PROTONIX ) 40 MG tablet Take 1 tablet (40 mg total) by mouth 2 (two) times daily. Take twice a day before meal for a month and then once a day. 08/28/17  Yes Dolan Mateo Larger, MD  amphetamine-dextroamphetamine (ADDERALL) 20 MG tablet Take 20 mg by mouth every morning. Patient not taking: Reported on 01/28/2024 12/15/23   [provider]  ARIPiprazole  (ABILIFY ) 9.75 MG/1.3ML injection Inject 15 mg into the muscle every 30 (thirty) days. Patient not taking: Reported on 01/28/2024    [provider]  clonazePAM (KLONOPIN) 0.5 MG tablet Take 0.5 mg by mouth 2 (two) times daily. Patient not taking: Reported on 01/28/2024    [provider]  fluvoxaMINE  (LUVOX ) 100 MG tablet Take 1 tablet (100 mg total) by mouth 2 (two) times daily. Patient not taking: Reported on 01/28/2024 01/16/17   Clapacs, Norleen DASEN, MD  lisdexamfetamine (VYVANSE) 70 MG capsule Take 70 mg by mouth every morning.    [provider]    Allergies: Patient has no known allergies.     Review of Systems  All other systems reviewed and are negative.   Updated Vital Signs BP (!) 125/107 (BP Location: Left Arm)   Pulse 66   Temp 98 F (36.7 C) (Oral)   Resp 17   Ht 5' 10 (1.778 m)   Wt 78 kg   SpO2 99%   BMI 24.67 kg/m   Physical Exam Vitals and nursing note reviewed.  HENT:     Mouth/Throat:     Mouth: Mucous membranes are dry.   Eyes:     Conjunctiva/sclera: Conjunctivae normal.    Cardiovascular:     Rate and Rhythm: Regular rhythm. Tachycardia present.  Pulmonary:     Effort: Pulmonary effort is normal.  Abdominal:     General: Abdomen is flat.   Skin:    General: Skin is dry.     Capillary Refill: Capillary refill takes more than 3 seconds.   Neurological:     General: No focal deficit present.     Mental Status: He is alert.     Cranial Nerves: No cranial nerve deficit.     Gait: Gait normal.   Psychiatric:        Mood and Affect: Mood is depressed. Affect is flat.        Behavior: Behavior is not agitated or aggressive.     (all labs ordered are listed, but only abnormal results are displayed) Labs Reviewed  COMPREHENSIVE METABOLIC PANEL  WITH GFR - Abnormal; Notable for the following components:      Result Value   Sodium 149 (*)    Chloride 112 (*)    Glucose, Bld 121 (*)    BUN 55 (*)    Creatinine, Ser 1.89 (*)    Total Protein 9.1 (*)    GFR, Estimated 44 (*)    All other components within normal limits  CBC WITH DIFFERENTIAL/PLATELET - Abnormal; Notable for the following components:   WBC 12.3 (*)    RBC 6.96 (*)    MCV 73.4 (*)    MCH 20.5 (*)    MCHC 28.0 (*)    RDW 19.3 (*)    Platelets 509 (*)    Neutro Abs 9.6 (*)    All other components within normal limits  SALICYLATE LEVEL - Abnormal; Notable for the following components:   Salicylate Lvl <7.0 (*)    All other components within normal limits  ACETAMINOPHEN  LEVEL - Abnormal; Notable for the following components:   Acetaminophen  (Tylenol ), Serum <10 (*)     All other components within normal limits  BASIC METABOLIC PANEL WITH GFR - Abnormal; Notable for the following components:   Sodium 150 (*)    Glucose, Bld 101 (*)    BUN 53 (*)    Creatinine, Ser 1.47 (*)    GFR, Estimated 60 (*)    All other components within normal limits  CBC - Abnormal; Notable for the following components:   RBC 6.91 (*)    MCV 72.8 (*)    MCH 20.5 (*)    MCHC 28.2 (*)    RDW 19.0 (*)    All other components within normal limits  VITAMIN B12 - Abnormal; Notable for the following components:   Vitamin B-12 1,203 (*)    All other components within normal limits  FERRITIN - Abnormal; Notable for the following components:   Ferritin 6 (*)    All other components within normal limits  IRON AND TIBC - Abnormal; Notable for the following components:   TIBC 571 (*)    All other components within normal limits  RETICULOCYTES - Abnormal; Notable for the following components:   RBC. 6.75 (*)    All other components within normal limits  CBC - Abnormal; Notable for the following components:   RBC 6.50 (*)    MCV 72.6 (*)    MCH 20.9 (*)    MCHC 28.8 (*)    RDW 18.6 (*)    All other components within normal limits  BASIC METABOLIC PANEL WITH GFR - Abnormal; Notable for the following components:   Sodium 149 (*)    Chloride 112 (*)    Glucose, Bld 57 (*)    BUN 47 (*)    Creatinine, Ser 1.32 (*)    All other components within normal limits  MAGNESIUM  - Abnormal; Notable for the following components:   Magnesium  3.0 (*)    All other components within normal limits  PHOSPHORUS - Abnormal; Notable for the following components:   Phosphorus 5.5 (*)    All other components within normal limits  CBC - Abnormal; Notable for the following components:   RBC 6.17 (*)    Hemoglobin 12.9 (*)    MCV 73.3 (*)    MCH 20.9 (*)    MCHC 28.5 (*)    RDW 18.6 (*)    All other components within normal limits  BASIC METABOLIC PANEL WITH GFR - Abnormal; Notable for the  following components:  Glucose, Bld 103 (*)    BUN 36 (*)    Creatinine, Ser 1.38 (*)    All other components within normal limits  I-STAT CHEM 8, ED - Abnormal; Notable for the following components:   Sodium 151 (*)    Chloride 115 (*)    BUN 56 (*)    Creatinine, Ser 1.90 (*)    Glucose, Bld 120 (*)    Calcium, Ion 1.11 (*)    All other components within normal limits  CBG MONITORING, ED - Abnormal; Notable for the following components:   Glucose-Capillary 113 (*)    All other components within normal limits  ETHANOL  HIV ANTIBODY (ROUTINE TESTING W REFLEX)  TSH  AMMONIA   FOLATE  MAGNESIUM   PHOSPHORUS  RAPID URINE DRUG SCREEN, HOSP PERFORMED  URINALYSIS, ROUTINE W REFLEX MICROSCOPIC  BASIC METABOLIC PANEL WITH GFR  MAGNESIUM   PHOSPHORUS    EKG: EKG Interpretation Date/Time:  Thursday January 28 2024 16:01:38 EDT Ventricular Rate:  114 PR Interval:  132 QRS Duration:  82 QT Interval:  308 QTC Calculation: 424 R Axis:   84  Text Interpretation: Sinus tachycardia Right atrial enlargement Pulmonary disease pattern Abnormal ECG When compared with ECG of 26-Aug-2017 21:14, No significant change since prior 1/19 Confirmed by Towana Sharper [phone]) on 01/29/2024 11:40:01 AM  Radiology: No results found.   Procedures   Medications Ordered in the ED  acetaminophen  (TYLENOL ) tablet 650 mg (has no administration in time range)    Or  acetaminophen  (TYLENOL ) suppository 650 mg (has no administration in time range)  oxyCODONE  (Oxy IR/ROXICODONE ) immediate release tablet 5 mg (has no administration in time range)  senna-docusate (Senokot-S) tablet 1 tablet (has no administration in time range)  bisacodyl  (DULCOLAX) EC tablet 5 mg (has no administration in time range)  sodium phosphate (FLEET) enema 1 enema (has no administration in time range)  ondansetron  (ZOFRAN ) tablet 4 mg (has no administration in time range)    Or  ondansetron  (ZOFRAN ) injection 4 mg (has no  administration in time range)  nicotine  (NICODERM CQ  - dosed in mg/24 hours) patch 21 mg (has no administration in time range)  enoxaparin  (LOVENOX ) injection 40 mg (40 mg Subcutaneous Patient Refused/Not Given 01/30/24 2234)  hydrALAZINE  (APRESOLINE ) injection 10 mg (has no administration in time range)  metoprolol  tartrate (LOPRESSOR ) injection 5 mg (has no administration in time range)  dextrose  5 % and 0.45 % NaCl infusion ( Intravenous Not Given 01/28/24 2046)  mupirocin ointment (BACTROBAN) 2 % ( Topical Given 01/31/24 0955)  OLANZapine  (ZYPREXA ) tablet 10 mg (10 mg Oral Given 01/30/24 2232)    Or  OLANZapine  (ZYPREXA ) injection 10 mg ( Intramuscular See Alternative 01/30/24 2232)  OLANZapine  (ZYPREXA ) tablet 5 mg (has no administration in time range)    Or  OLANZapine  (ZYPREXA ) injection 5 mg (has no administration in time range)  ferrous sulfate tablet 325 mg (325 mg Oral Given 01/31/24 0954)  0.9 %  sodium chloride  infusion ( Intravenous Not Given 01/30/24 1431)                                    Medical Decision Making 43 year old male with a past medical history of psychosis brought in due to concerns about dehydration.  He is refusing food and water for several days.  He does appear dry clinically.  Heart rate elevated as well.   Patient requires medical clearance before psych can get  involved.  Differential includes dehydration, electrolyte abnormalities, kidney injury, and others.  Lab work obtained in triage shows significant dehydration. He is hypernatremic and has an AKI.  He is agreeing to stay for IV fluids.  Patient is calm in the ED, but is refusing to give urine and refusing any antipsychotic medication.  He has been placed under an IVC, however he is not medically clear at this time.  Consult to psychiatry was placed in the ED.  Will reach out to you hospitalist team for admission and continued hydration.  Amount and/or Complexity of Data Reviewed Labs:  ordered.  Risk Decision regarding hospitalization.     Final diagnoses:  Psychosis, unspecified psychosis type (HCC)  Dehydration  AKI (acute kidney injury) [facility])    ED Discharge Orders     None          Shandrika Ambers, DO 01/31/24 1908

## 2024-01-28 NOTE — ED Notes (Signed)
 Received completed IVC documents from originating unit secretary and delivered to RN prior to transport to admitting assignment.

## 2024-01-28 NOTE — ED Notes (Signed)
Pt refusing to provide urine sample at this time.

## 2024-01-28 NOTE — ED Notes (Signed)
 The patient's daughter approached this RN and stated that her father has stopped taking his mental health medications and she believes his symptoms are from that. She states she did not want to mention this in front of him. She stated he believes he is fasting. His brother who drove ffrom out of state to make sure he came to the ER. He states the patient is having a mental breakdown and has not eaten in 2 weeks and has lost 40 lbs because he believes he is on a mission from God and is speaking to God. He also quit his job. Family verbalizes concern for his mental health.

## 2024-01-28 NOTE — ED Notes (Addendum)
 Pt's girlfriend is at bedside. Pt is compliant and agreeable at this time. Pt stated to this NT that he has seen a psych provider in the past however, he does not want to resume care.

## 2024-01-28 NOTE — ED Notes (Signed)
 The magistrate stated that she was going to reject the IVC request due to Dr. MARLA. Horton's wording being weak. The magistrate had multiple questions including how the patient arrived, how is his behavior, and questioned what Dr. MARLA. Horton was referencing when she stated that the patient was on a mission from God.  I informed the magistrate that I could speak with Dr. MARLA. Horton and relay the information to resubmit the the IVC documents.  After discussion with the magistrate, she stated that she would let the IVC pass and accept it.  I thanked her for her kindness.  The IVC documents were placed in the blue zone on the clip board at the second nurse's station.  The blue zone secretary returned the documents to the [employer] as I was the one that started it and should complete the process.

## 2024-01-28 NOTE — ED Provider Triage Note (Signed)
 Emergency Medicine Provider Triage Evaluation Note  Benjamin [name] , a 43 y.o. male  was evaluated in triage.  Pt states that he is on a mission from God but will not divulge the information any further.  Used to be on Luvox  apparently not taking that medication states I do not want to take any medicine..  Patient reports that he is on admission from God and this is admission that apparently requires fasting from food and water.  He has not been eating or drinking for 2 weeks.  Review of Systems  Positive: Delusional psychosis Negative:   Physical Exam  BP (!) 119/102 (BP Location: Right Arm)   Pulse (!) 116   Temp 97.9 F (36.6 C)   Resp (!) 22   Ht 5' 10 (1.778 m)   SpO2 98%   BMI 27.71 kg/m  Gen:   Awake, no distress   Resp:  Normal effort  MSK:   Moves extremities without difficulty  Other:    Medical Decision Making  Medically screening exam initiated at 2:02 PM.  Appropriate orders placed.  Benjamin [name] was informed that the remainder of the evaluation will be completed by another provider, this initial triage assessment does not replace that evaluation, and the importance of remaining in the ED until their evaluation is complete.  Patient placed under IVC- needs medical clearance   Arloa Chroman, PA-C 01/28/24 1424

## 2024-01-29 DIAGNOSIS — F4310 Post-traumatic stress disorder, unspecified: Secondary | ICD-10-CM

## 2024-01-29 DIAGNOSIS — F22 Delusional disorders: Secondary | ICD-10-CM

## 2024-01-29 LAB — FERRITIN: Ferritin: 6 ng/mL — ABNORMAL LOW (ref 24–336)

## 2024-01-29 LAB — CBC
HCT: 50.3 % (ref 39.0–52.0)
Hemoglobin: 14.2 g/dL (ref 13.0–17.0)
MCH: 20.5 pg — ABNORMAL LOW (ref 26.0–34.0)
MCHC: 28.2 g/dL — ABNORMAL LOW (ref 30.0–36.0)
MCV: 72.8 fL — ABNORMAL LOW (ref 80.0–100.0)
Platelets: 383 10*3/uL (ref 150–400)
RBC: 6.91 MIL/uL — ABNORMAL HIGH (ref 4.22–5.81)
RDW: 19 % — ABNORMAL HIGH (ref 11.5–15.5)
WBC: 7.7 10*3/uL (ref 4.0–10.5)
nRBC: 0 % (ref 0.0–0.2)

## 2024-01-29 LAB — FOLATE: Folate: 26.5 ng/mL (ref >5.9)

## 2024-01-29 LAB — IRON AND TIBC
Iron: 123 ug/dL (ref 45–182)
Saturation Ratios: 22 % (ref 17.9–39.5)
TIBC: 571 ug/dL — ABNORMAL HIGH (ref 250–450)
UIBC: 448 ug/dL

## 2024-01-29 LAB — BASIC METABOLIC PANEL WITH GFR
Anion gap: 14 (ref 5–15)
BUN: 53 mg/dL — ABNORMAL HIGH (ref 6–20)
CO2: 26 mmol/L (ref 22–32)
Calcium: 10 mg/dL (ref 8.9–10.3)
Chloride: 110 mmol/L (ref 98–111)
Creatinine, Ser: 1.47 mg/dL — ABNORMAL HIGH (ref 0.61–1.24)
GFR, Estimated: 60 mL/min — ABNORMAL LOW (ref >60)
Glucose, Bld: 101 mg/dL — ABNORMAL HIGH (ref 70–99)
Potassium: 4.6 mmol/L (ref 3.5–5.1)
Sodium: 150 mmol/L — ABNORMAL HIGH (ref 135–145)

## 2024-01-29 LAB — RETICULOCYTES
Immature Retic Fract: 6.9 % (ref 2.3–15.9)
RBC.: 6.75 MIL/uL — ABNORMAL HIGH (ref 4.22–5.81)
Retic Count, Absolute: 27 10*3/uL (ref 19.0–186.0)
Retic Ct Pct: 0.4 % (ref 0.4–3.1)

## 2024-01-29 MED ORDER — OLANZAPINE 10 MG PO TABS
10.0000 mg | ORAL_TABLET | Freq: Every day | ORAL | Status: AC
  Administered 2024-01-29 – 2024-01-31 (×3): 10 mg via ORAL
  Filled 2024-01-29 (×3): qty 1

## 2024-01-29 MED ORDER — OLANZAPINE 5 MG PO TABS: 5.0000 mg | ORAL_TABLET | Freq: Four times a day (QID) | ORAL | Status: DC | PRN

## 2024-01-29 MED ORDER — OLANZAPINE 10 MG IM SOLR
10.0000 mg | Freq: Every day | INTRAMUSCULAR | Status: AC
  Filled 2024-01-29 (×3): qty 10

## 2024-01-29 MED ORDER — MUPIROCIN 2 % EX OINT
TOPICAL_OINTMENT | Freq: Two times a day (BID) | CUTANEOUS | Status: DC
  Filled 2024-01-29: qty 22

## 2024-01-29 MED ORDER — OLANZAPINE 10 MG IM SOLR: 5.0000 mg | Freq: Four times a day (QID) | INTRAMUSCULAR | Status: DC | PRN

## 2024-01-29 NOTE — Progress Notes (Signed)
 Progress Note   Patient: Benjamin [name] FMW:[id number] DOB: [birth year] DOA: 01/28/2024  DOS: the patient was seen and examined on 01/29/2024   Brief hospital course:  43 y.o. male with medical history significant of depression, psychosis, PTSD, OCD, marijuana use admitted to the hospital by the family due to mental health concerns.  According to the family and the patient he has not eaten anything in over 2 weeks and has lost some weight.  Patient keeps telling me that he is on admission from God not to eat and drink.  He does not have any other complaints.  Does not provide any other history.  Denies taking any medications at home.   Assessment and Plan:  Acute kidney injury - Likely prerenal etiology given patient's lack of p.o. hydration.  Ordering IV fluids however patient refusing still.  Continue to monitor urine output and recheck BMP and magnesium  in AM.  Hypernatremia - Secondary to above.  Noted dehydration with free water deficit.  Will recheck sodium in AM.  Religious Delusions/Acute psychosis - Currently stating he needs on a mission from God to not eat or drink.  Psych consulted and following closely.  Proceeding with forced medication protocol.  Medications per psych.  IV fluids ordered. Continues to be IVC.    Subjective: Patient resting comfortably this morning.  Refusing IV medications and all oral meds and supplements.  Does not want to eat or drink.  States he is on mission from God.  Continue to assess that he will be fine and that he wants to leave.  Denies any fever, shortness of breath, chest pain, nausea, vomiting, abdominal pain.  Otherwise very calm and conversant.  Physical Exam:  Vitals:   01/28/24 1401 01/28/24 1953 01/28/24 2357 01/29/24 0500  BP: (!) 119/102 (!) 125/99 (!) 129/94 (!) 128/92  Pulse: (!) 116 70 67   Resp: (!) 22 18 18 18   Temp: 97.9 F (36.6 C) 98.3 F (36.8 C) 97.8 F (36.6 C) 97.8 F (36.6 C)  TempSrc:  Oral  Oral  SpO2: 98%  100% 99% 100%  Height:        GENERAL:  Alert, pleasant, no acute distress  HEENT:  EOMI CARDIOVASCULAR:  RRR, no murmurs appreciated RESPIRATORY:  Clear to auscultation, no wheezing, rales, or rhonchi GASTROINTESTINAL:  Soft, nontender, nondistended EXTREMITIES:  No LE edema bilaterally NEURO:  No new focal deficits appreciated SKIN: Dry PSYCH: Calm, conversant, religious delusions.    Data Reviewed:  No new imaging to review  Previous records (including but not limited to H&P, progress notes, nursing notes, TOC management) were reviewed in assessment of this patient.  Labs: CBC: Recent Labs  Lab 01/28/24 1416 01/28/24 1430 01/29/24 0559  WBC 12.3*  --  7.7  NEUTROABS 9.6*  --   --   HGB 14.3 17.0 14.2  HCT 51.1 50.0 50.3  MCV 73.4*  --  72.8*  PLT 509*  --  383   Basic Metabolic Panel: Recent Labs  Lab 01/28/24 1416 01/28/24 1430 01/29/24 0559  NA 149* 151* 150*  K 4.5 4.5 4.6  CL 112* 115* 110  CO2 23  --  26  GLUCOSE 121* 120* 101*  BUN 55* 56* 53*  CREATININE 1.89* 1.90* 1.47*  CALCIUM 9.9  --  10.0   Liver Function Tests: Recent Labs  Lab 01/28/24 1416  AST 33  ALT 24  ALKPHOS 50  BILITOT 0.8  PROT 9.1*  ALBUMIN 5.0   CBG: Recent Labs  Lab 01/28/24 1606  GLUCAP 113*    Scheduled Meds:  enoxaparin  (LOVENOX ) injection  40 mg Subcutaneous Q24H   mupirocin ointment   Topical BID   Continuous Infusions:  dextrose  5 % and 0.45 % NaCl     PRN Meds:.acetaminophen  **OR** acetaminophen , bisacodyl , hydrALAZINE , metoprolol  tartrate, nicotine , ondansetron  **OR** ondansetron  (ZOFRAN ) IV, oxyCODONE , senna-docusate, sodium phosphate  Family Communication: none at bedside  Disposition: Status is: Observation The patient will require care spanning > 2 midnights and should be moved to inpatient because: AKI, acute psychosis     Time spent: 36 minutes  Length of inpatient stay: 0 days  Author: Carliss LELON Canales, DO 01/29/2024 12:47 PM  For  on call review [email or web address].

## 2024-01-29 NOTE — Hospital Course (Signed)
 43 y.o. male with medical history significant of depression, psychosis, PTSD, OCD, marijuana use admitted to the hospital by the family due to mental health concerns.  According to the family and the patient he has not eaten anything in over 2 weeks and has lost some weight.  Patient keeps telling me that he is on admission from God not to eat and drink.  He does not have any other complaints.  Does not provide any other history.  Denies taking any medications at home.   Assessment and Plan:   Acute kidney injury - Likely prerenal etiology given patient's lack of p.o. hydration.  Reordering IV fluids now that patient is on forced medication protocol.  Appears to now be excepting some p.o. liquids.  Creatinine mildly improved from yesterday.  Continue to monitor urine output and recheck BMP and magnesium  in AM.   Hypernatremia - Secondary to above.  Noted dehydration with free water deficit.  Reordered fluids NS at 100 mL/h.  Will recheck sodium in AM.   Religious Delusions/Acute psychosis - Currently stating he needs on a mission from God to not eat or drink.  Psych consulted and following closely.  Proceeding with forced medication protocol.  Medications per psych.  IV fluids ordered. Continues to be IVC.

## 2024-01-29 NOTE — Consult Note (Signed)
 WOC Nurse Consult Note: Reason for Consult: abrasion R plantar foot, base of great toe  Wound type: full thickness unknown etiology ? Trauma  Pressure Injury POA: NA not likely r/t pressure  Measurement: see nursing flowsheet  Wound bed: appears red and clean  Drainage (amount, consistency, odor) see nursing flowsheet  Periwound: intact  Dressing procedure/placement/frequency: Cleanse R plantar foot/base of great toe wound with NS, apply Mupirocin ointment to wound bed twice daily, cover with small piece of Telfa nonstick dressing and secure with silicone foam.    POC discussed with bedside nurse. WOC team will not follow. Re-consult if further needs arise.    Thank you,    Powell Bar MSN, RN-BC, [employer]

## 2024-01-29 NOTE — Plan of Care (Signed)

## 2024-01-29 NOTE — Consult Note (Signed)
 [facility] Psychiatric Consult Initial  Patient Name: .Benjamin [name]  MRN: [id number]  DOB: 11/22/1980  Consult Order details:  Orders (From admission, onward)     Start     Ordered   01/28/24 1654  IP CONSULT TO PSYCHIATRY       Ordering Provider: Mannie Jerel PARAS, NP  Provider:  (Not yet assigned)  Question Answer Comment  Location [facility]   Reason for Consult? 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 male admitted 01/28/2024  1:57 PM for poor PO intake. 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   His current presentation of poor PO intake in the setting of psychosis is most consistent with schizophrenia vs bipolar I disorder with psychotic features. He meets criteria for IVC based on danger to self.  Current outpatient psychotropic medications include Abilify  and fluvoxamine  and historically he has had a good response to these medications. He was noncompliant with medications prior to admission as evidenced by collateral report. Please see plan below for detailed recommendations.   Diagnoses:  Active Hospital problems: Principal Problem:   AKI (acute kidney injury) (HCC) Active Problems:   PTSD (post-traumatic stress disorder)    Plan   ## Psychiatric Recommendations:   Patient's refusal to take PO fluids and solids exists in the setting of hyper-religious psychotic  delusions (believes God has commanded him to refuse PO intake) and constitutes a medical emergency. He has developed an acute kidney injury and hypernatremia which threatens to worsen if he continues to refuse IV/PO fluids. Per his brother, he has lost 30 lbs over the past few weeks. He expressed the sentiment that I'll be OK without food or water, showing poor insight and demonstrating the absence of decision-making capacity. Patient's brother, Benjamin [name], has given verbal consent as patient's substitute decision maker to begin patient on involuntary psychiatric medications. This is a substantial change from patient's psychiatric baseline -- he is otherwise functional, not hyperreligious, consistent with his psychiatric medications and raises two children. Patient has had previous religious delusions of a similar nature in the past which have responded well to second-generation antipsychotics.   As a result, this patient meets criteria for involuntary forced medication as outlined in 10A NCAC 26D .1104.  In this setting of an Initial Emergency Situation, a physician:   (A) may initiate procedures and write an order for administering emergency forced  medication for a period not to exceed 72 hours; and (B) shall document in the client's record the pertinent circumstances and rationale for the  psychotropic medication.  This initial 72 hour period will start @2000  on 01/29/2024 with the first administration of Zyprexa  and expire @2000  on 02/01/2024, after which, two further 72 hour periods may be authorized with the written or verbal concurrence of another psychiatrist not involved in this patient's care.   -- Start PO Zyprexa  10 mg at bedtime for psychosis with IM  zyprexa  10 mg as backup in case of refusal. If patient refuses the PO medication after 30 minutes of its offering, he should be presented with the choice of either PO or IM. If he refuses at that time, security should be called and the IM  medication should be administered. -- Start PO vs IM Zyprexa  5 mg PRN for agitation.  -- Appropriate for transfer to inpatient hospital when PO intake resumes. -- Psychiatry will continue to follow.    ## Medical Decision Making Capacity: Patient does not at this time have the capacity to refuse or accept medications d/t poor insight into his current mental state.  ## Further Work-up:  -- UA, UDS, Cr -- most recent EKG on 01/28/2024 had QtC of 424 -- Pertinent labwork reviewed earlier this admission includes: B12 1203, Ammonia  WNL, TSH WNL, TIBC 571, awaiting UDS   ## Disposition:-- We recommend inpatient psychiatric hospitalization after medical hospitalization. Patient has been involuntarily committed on 01/28/2024.   ## Behavioral / Environmental: - No specific recommendations at this time.     ## Safety and Observation Level:  - Based on my clinical evaluation, I estimate the patient to be at moderate risk of self harm in the current setting. - At this time, we recommend  1:1 Observation. This decision is based on my review of the chart including patient's history and current presentation, interview of the patient, mental status examination, and consideration of suicide risk including evaluating suicidal ideation, plan, intent, suicidal or self-harm behaviors, risk factors, and protective factors. This judgment is based on our ability to directly address suicide risk, implement suicide prevention strategies, and develop a safety plan while the patient is in the clinical setting. Please contact our team if there is a concern that risk level has changed.  CSSR Risk Category:C-SSRS RISK CATEGORY: No Risk  Suicide Risk Assessment: Patient has following modifiable risk factors for suicide: recklessness, medication noncompliance, active mental illness (to encompass adhd, tbi, mania, psychosis, trauma reaction), and recent loss (death, isolation, vocation), which we are addressing by medication  management. Patient has following non-modifiable or demographic risk factors for suicide: male gender, separation or divorce, and psychiatric hospitalization Patient has the following protective factors against suicide: Supportive family, Cultural, spiritual, or religious beliefs that discourage suicide, Minor children in the home, and no history of suicide attempts  Thank you for this consult request. Recommendations have been communicated to the primary team.  We will continue to follow at this time.   Benjamin Ledford, MD       History of Present Illness  Relevant Aspects of Hospital Course:  Admitted on 01/28/2024 for poor PO intake and psychosis. Family on admission said that patient had neither ate nor drank in two weeks on a mission from god. He is paranoid and does not explain further. Placed under IVC in ED. Did consume 4 cups of PO water but has since refused since being admitted.   Patient Report:   On interview, patient is at first reticent to discuss his situation but then explained that he had been newly in touch with God, who commanded him to fast. Says he only presented to the hospital to please my daughter who didn't like that he wasn't eating. Explains that he has recently come in closer communication with god. Says he will neither eat nor drink on God's command. When asked what he believes would happen, and if he's worried that he will die if he does not eat or drink, patient said I'll  be OK. Says that he may eat sometime in the future but it won't be today and that the lord will help me. Endorses that he has not been taking his medication and appears confused when asked about abilify . Denies suicidal and homicidal ideation both now and in the past. Denies current substance use. Lives with his children. Does not currently see a psychiatrist or therapist. Says I'm not threatening anyone but that those who oppose God should watch out. Clarifies that he is not God's  instrument and that he is not planning on physically harming anyone. However, says that this provider should remember the golden rule to do unto others, and that that's not what's happening here.   Psych ROS:  Depression: Denies currently Anxiety:  Denies Mania (lifetime and current): Denies previously. Very grandiose but not hyperverbal, no flight of ideas, not particularly irritable or distractable.  Psychosis: (lifetime and current): Denies, although looks psychotic on exam, staring forward, some speech latency. No RTIS. (Has an extensive past medical history of hypereligiosity, psychosis, and catatonia).   Collateral information:  Contacted Andreas Sobolewski, Brother at [phone] on 01/29/2024  Helped patient get admitted -- drove down from [place]. Patient is typically a very hardworking individual, great dad with two kids. Ten years ago, patient's wife was a bad person and patient had a psychotic break in 2016. At that time, he had just left his ex-wife and did not speak with anyone for a month. Had lost 70 lbs and not eaten for a month. Completely non-verbal. Hyper-religious: told brother to fast. Was brought up in the church, but this is different from baseline. 3 weeks ago, mother was admitted into hospice. May have been the reason for patient's current psychotic break. At some point over that time stopped taking his meds. Unsure of names, but on an antipsychotic and mood stabilizer. Had severe paranoia (people listening in). If I were to guess, he stopped taking his meds about 1 month ago. Was acting different on fathers day weekend, lots of religious talk. This scared brother -- would leave the kids with brother and go on long drives, all day long. Had been isolating. No firearms and no violence. Certainly not a dangerous person. Girlfriend drove down from [place] . Had to coax him into going into the hospital for the purpose of checking on his vitals. Lost ~30 lbs since father's day,  extremely significant. Very similar episode from previous. Believes he will be back to his normal self on his meds. Largest factor: mother moving from nursing home to hospice. He did confirm with brother that he stopped taking his medication. Normally, very productive. Out of the norm.   CONSENTS to forced medication protocol as a substitute decision maker as patient does not currently has medical capacity: needs to be back on his meds.   Review of Systems  All other systems reviewed and are negative.    Psychiatric and Social History  Psychiatric History:  Information collected from chart review, family member.   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 Remote history of Olanzapine .  Previous Med Trials: Trazodone  100 mg at bedtime, Haldol  (not helpful)  Therapy: Unknown  Prior Psych Hospitalization: 01/05/2017 --  01/16/2017 for an extended phase of catatonia after self-discontinuing abilify  unresponsive to Ativan . Stopped talking, sleeping, eating and drinking. Started at wall, would not verbally communicate. Needed 5 treatments.  Prior Self Harm: Denies Prior Violence: Denies  Family Psych History: Depression in brother Family Hx suicide: Father  Social History:  Educational Hx: Unknown Occupational Hx: Worked as a Therapist, music Hx: Unknown Living Situation: With two teenage children, exwife does not have custody Spiritual Hx: Grew up in a religious christian family, but becomes hyperreligious Access to weapons/lethal means: Brother denies  Substance History Alcohol: Remote, per chart  review Tobacco: Remote Illicit drugs: previous Kratom use.  Prescription drug abuse: Unknown Rehab hx: Unknown  Exam Findings  Physical Exam:  Vital Signs:  Temp:  [97.8 F (36.6 C)-98.3 F (36.8 C)] 97.8 F (36.6 C) (06/27 0500) Pulse Rate:  [67-116] 67 (06/26 2357) Resp:  [18-22] 18 (06/27 0500) BP: (119-129)/(92-102) 128/92 (06/27 0500) SpO2:  [98 %-100 %] 100 % (06/27 0500) Blood pressure (!) 128/92, pulse 67, temperature 97.8 F (36.6 C), temperature source Oral, resp. rate 18, height 5' 10 (1.778 m), SpO2 100%. Body mass index is 27.71 kg/m.  Physical Exam Vitals reviewed.     Mental Status Exam: General Appearance: Sitting upright with shirt off in bed, staring forward, disheveled, appears sunburned in face and upper neck area  Orientation:  Oriented to self, situation, place, year  Memory:  Grossly intact  Concentration:  Grossly intact  Recall:  Not assessed   Attention  Intact  Eye Contact:  Occasional, staring forward  Speech:  Some speech latency and slower  Language:  Intact  Volume:  Normal  Mood: I'm OK  Affect:  Restricted  Thought Process:  Linear, goal directed  Thought Content:  Delusions of grandeur  Suicidal Thoughts:  Denies  Homicidal Thoughts:  Denies  Judgement:  None  Insight:  None  Psychomotor Activity:  WNL  Akathisia: WNL  Fund of Knowledge: Unknown      Assets:  supportive family, housing  Cognition:  UTA  ADL's:  Not intact   AIMS (if indicated):        Other History   These have been pulled in through the EMR, reviewed, and updated if appropriate.  Family History:  The patient's family history includes Diabetes Mellitus II in his mother; Seizures in his mother; Stroke in his mother.  Medical History: Past Medical History:  Diagnosis Date   Depression    Hematemesis 08/2017   Herniated disc    x3   Hypertension    Mental disorder     Surgical History: Past Surgical History:  Procedure Laterality Date    ESOPHAGOGASTRODUODENOSCOPY Left 08/27/2017   Procedure: ESOPHAGOGASTRODUODENOSCOPY (EGD);  Surgeon: Rollin Dover, MD;  Location: [facility] ENDOSCOPY;  Service: Endoscopy;  Laterality: Left;   SHOULDER SURGERY     WISDOM TOOTH EXTRACTION       Medications:   Current Facility-Administered Medications:    acetaminophen  (TYLENOL ) tablet 650 mg, 650 mg, Oral, Q6H PRN **OR** acetaminophen  (TYLENOL ) suppository 650 mg, 650 mg, Rectal, Q6H PRN, Amin, Ankit C, MD   bisacodyl  (DULCOLAX) EC tablet 5 mg, 5 mg, Oral, Daily PRN, Amin, Ankit C, MD   dextrose  5 % and 0.45 % NaCl infusion, , Intravenous, Continuous, Amin, Ankit C, MD   enoxaparin  (LOVENOX ) injection 40 mg, 40 mg, Subcutaneous, Q24H, Amin, Ankit C, MD   hydrALAZINE  (APRESOLINE ) injection 10 mg, 10 mg, Intravenous, Q4H PRN, Amin, Ankit C, MD   metoprolol  tartrate (LOPRESSOR )  injection 5 mg, 5 mg, Intravenous, Q4H PRN, Amin, Ankit C, MD   mupirocin ointment (BACTROBAN) 2 %, , Topical, BID, Calkins, Derek W, DO   nicotine  (NICODERM CQ  - dosed in mg/24 hours) patch 21 mg, 21 mg, Transdermal, Daily PRN, Amin, Ankit C, MD   ondansetron  (ZOFRAN ) tablet 4 mg, 4 mg, Oral, Q6H PRN **OR** ondansetron  (ZOFRAN ) injection 4 mg, 4 mg, Intravenous, Q6H PRN, Amin, Ankit C, MD   oxyCODONE  (Oxy IR/ROXICODONE ) immediate release tablet 5 mg, 5 mg, Oral, Q4H PRN, Amin, Ankit C, MD   senna-docusate (Senokot-S) tablet 1 tablet, 1 tablet, Oral, QHS PRN, Amin, Ankit C, MD   sodium phosphate (FLEET) enema 1 enema, 1 enema, Rectal, Once PRN, Amin, Ankit C, MD  Allergies: No Known Allergies  Lyndsey Demos, MD

## 2024-01-29 NOTE — Plan of Care (Signed)

## 2024-01-30 LAB — MAGNESIUM: Magnesium: 3 mg/dL — ABNORMAL HIGH (ref 1.7–2.4)

## 2024-01-30 LAB — BASIC METABOLIC PANEL WITH GFR
Anion gap: 11 (ref 5–15)
BUN: 47 mg/dL — ABNORMAL HIGH (ref 6–20)
CO2: 26 mmol/L (ref 22–32)
Calcium: 9.6 mg/dL (ref 8.9–10.3)
Chloride: 112 mmol/L — ABNORMAL HIGH (ref 98–111)
Creatinine, Ser: 1.32 mg/dL — ABNORMAL HIGH (ref 0.61–1.24)
GFR, Estimated: >60 mL/min (ref >60)
Glucose, Bld: 57 mg/dL — ABNORMAL LOW (ref 70–99)
Potassium: 4.1 mmol/L (ref 3.5–5.1)
Sodium: 149 mmol/L — ABNORMAL HIGH (ref 135–145)

## 2024-01-30 LAB — CBC
HCT: 47.2 % (ref 39.0–52.0)
Hemoglobin: 13.6 g/dL (ref 13.0–17.0)
MCH: 20.9 pg — ABNORMAL LOW (ref 26.0–34.0)
MCHC: 28.8 g/dL — ABNORMAL LOW (ref 30.0–36.0)
MCV: 72.6 fL — ABNORMAL LOW (ref 80.0–100.0)
Platelets: 392 10*3/uL (ref 150–400)
RBC: 6.5 MIL/uL — ABNORMAL HIGH (ref 4.22–5.81)
RDW: 18.6 % — ABNORMAL HIGH (ref 11.5–15.5)
WBC: 9.3 10*3/uL (ref 4.0–10.5)
nRBC: 0 % (ref 0.0–0.2)

## 2024-01-30 LAB — PHOSPHORUS: Phosphorus: 5.5 mg/dL — ABNORMAL HIGH (ref 2.5–4.6)

## 2024-01-30 MED ORDER — SODIUM CHLORIDE 0.9 % IV SOLN: INTRAVENOUS | Status: AC

## 2024-01-30 MED ORDER — FERROUS SULFATE 325 (65 FE) MG PO TABS
325.0000 mg | ORAL_TABLET | Freq: Every day | ORAL | Status: DC
  Administered 2024-01-30 – 2024-01-31 (×2): 325 mg via ORAL
  Filled 2024-01-30 (×2): qty 1

## 2024-01-30 NOTE — Progress Notes (Signed)
 Progress Note   Patient: Benjamin [name] FMW:[id number] DOB: [birth year] DOA: 01/28/2024  DOS: the patient was seen and examined on 01/30/2024   Brief hospital course:  43 y.o. male with medical history significant of depression, psychosis, PTSD, OCD, marijuana use admitted to the hospital by the family due to mental health concerns.  According to the family and the patient he has not eaten anything in over 2 weeks and has lost some weight.  Patient keeps telling me that he is on admission from God not to eat and drink.  He does not have any other complaints.  Does not provide any other history.  Denies taking any medications at home.   Assessment and Plan:   Acute kidney injury - Likely prerenal etiology given patient's lack of p.o. hydration.  Reordering IV fluids now that patient is on forced medication protocol.  Appears to now be excepting some p.o. liquids.  Creatinine mildly improved from yesterday.  Continue to monitor urine output and recheck BMP and magnesium  in AM.   Hypernatremia - Secondary to above.  Noted dehydration with free water deficit.  Reordered fluids NS at 100 mL/h.  Will recheck sodium in AM.   Religious Delusions/Acute psychosis - Currently stating he needs on a mission from God to not eat or drink.  Psych consulted and following closely.  Proceeding with forced medication protocol.  Medications per psych.  IV fluids ordered. Continues to be IVC.   Subjective: Patient resting comfortably this morning.  Is conceding to taking some p.o.  Denies any fever, chills, chest pain, nausea, vomiting, abdominal pain.  Physical Exam:  Vitals:   01/29/24 0500 01/29/24 1607 01/29/24 2010 01/30/24 0810  BP: (!) 128/92 (!) 135/95 (!) 123/99 (!) 115/90  Pulse:  (!) 56 69 (!) 59  Resp: 18 16 16 16   Temp: 97.8 F (36.6 C) 97.7 F (36.5 C) 98 F (36.7 C) 97.7 F (36.5 C)  TempSrc: Oral Oral Oral Oral  SpO2: 100% 100% 99% 100%  Height:        GENERAL:  Alert, pleasant, no  acute distress  HEENT:  EOMI CARDIOVASCULAR:  RRR, no murmurs appreciated RESPIRATORY:  Clear to auscultation, no wheezing, rales, or rhonchi GASTROINTESTINAL:  Soft, nontender, nondistended EXTREMITIES:  No LE edema bilaterally NEURO:  No new focal deficits appreciated SKIN: Dry PSYCH: Calm, conversant, religious delusions.   Data Reviewed:  No new imaging to review  Previous records (including but not limited to H&P, progress notes, nursing notes, TOC management) were reviewed in assessment of this patient.  Labs: CBC: Recent Labs  Lab 01/28/24 1416 01/28/24 1430 01/29/24 0559 01/30/24 0518  WBC 12.3*  --  7.7 9.3  NEUTROABS 9.6*  --   --   --   HGB 14.3 17.0 14.2 13.6  HCT 51.1 50.0 50.3 47.2  MCV 73.4*  --  72.8* 72.6*  PLT 509*  --  383 392   Basic Metabolic Panel: Recent Labs  Lab 01/28/24 1416 01/28/24 1430 01/29/24 0559 01/30/24 0518  NA 149* 151* 150* 149*  K 4.5 4.5 4.6 4.1  CL 112* 115* 110 112*  CO2 23  --  26 26  GLUCOSE 121* 120* 101* 57*  BUN 55* 56* 53* 47*  CREATININE 1.89* 1.90* 1.47* 1.32*  CALCIUM 9.9  --  10.0 9.6  MG  --   --   --  3.0*  PHOS  --   --   --  5.5*   Liver Function Tests: Recent Labs  Lab  01/28/24 1416  AST 33  ALT 24  ALKPHOS 50  BILITOT 0.8  PROT 9.1*  ALBUMIN 5.0   CBG: Recent Labs  Lab 01/28/24 1606  GLUCAP 113*    Scheduled Meds:  enoxaparin  (LOVENOX ) injection  40 mg Subcutaneous Q24H   ferrous sulfate  325 mg Oral Q breakfast   mupirocin ointment   Topical BID   OLANZapine   10 mg Oral QHS   Or   OLANZapine   10 mg Intramuscular QHS   Continuous Infusions: PRN Meds:.acetaminophen  **OR** acetaminophen , bisacodyl , hydrALAZINE , metoprolol  tartrate, nicotine , OLANZapine  **OR** OLANZapine , ondansetron  **OR** ondansetron  (ZOFRAN ) IV, oxyCODONE , senna-docusate, sodium phosphate  Family Communication: None at bedside  Disposition: Status is: Observation The patient remains OBS appropriate and will d/c  before 2 midnights.     Time spent: 33 minutes  Length of inpatient stay: 0 days  Author: Carliss LELON Canales, DO 01/30/2024 1:32 PM  For on call review [email or web address].

## 2024-01-30 NOTE — Plan of Care (Signed)

## 2024-01-31 DIAGNOSIS — E870 Hyperosmolality and hypernatremia: Secondary | ICD-10-CM

## 2024-01-31 DIAGNOSIS — N179 Acute kidney failure, unspecified: Secondary | ICD-10-CM | POA: Diagnosis not present

## 2024-01-31 LAB — CBC
HCT: 45.2 % (ref 39.0–52.0)
Hemoglobin: 12.9 g/dL — ABNORMAL LOW (ref 13.0–17.0)
MCH: 20.9 pg — ABNORMAL LOW (ref 26.0–34.0)
MCHC: 28.5 g/dL — ABNORMAL LOW (ref 30.0–36.0)
MCV: 73.3 fL — ABNORMAL LOW (ref 80.0–100.0)
Platelets: 278 10*3/uL (ref 150–400)
RBC: 6.17 MIL/uL — ABNORMAL HIGH (ref 4.22–5.81)
RDW: 18.6 % — ABNORMAL HIGH (ref 11.5–15.5)
WBC: 6.7 10*3/uL (ref 4.0–10.5)
nRBC: 0 % (ref 0.0–0.2)

## 2024-01-31 LAB — BASIC METABOLIC PANEL WITH GFR
Anion gap: 8 (ref 5–15)
BUN: 36 mg/dL — ABNORMAL HIGH (ref 6–20)
CO2: 27 mmol/L (ref 22–32)
Calcium: 8.9 mg/dL (ref 8.9–10.3)
Chloride: 106 mmol/L (ref 98–111)
Creatinine, Ser: 1.38 mg/dL — ABNORMAL HIGH (ref 0.61–1.24)
GFR, Estimated: >60 mL/min (ref >60)
Glucose, Bld: 103 mg/dL — ABNORMAL HIGH (ref 70–99)
Potassium: 3.9 mmol/L (ref 3.5–5.1)
Sodium: 141 mmol/L (ref 135–145)

## 2024-01-31 LAB — PHOSPHORUS: Phosphorus: 4.1 mg/dL (ref 2.5–4.6)

## 2024-01-31 LAB — MAGNESIUM: Magnesium: 2.4 mg/dL (ref 1.7–2.4)

## 2024-01-31 NOTE — Progress Notes (Signed)
 Progress Note   Patient: Benjamin [name] FMW:[id number] DOB: 15-Feb-1981 DOA: 01/28/2024  DOS: the patient was seen and examined on 01/31/2024   Brief hospital course:  43 y.o. male with medical history significant of depression, psychosis, PTSD, OCD, marijuana use admitted to the hospital by the family due to mental health concerns.  According to the family and the patient he has not eaten anything in over 2 weeks and has lost some weight.  Patient keeps telling me that he is on admission from God not to eat and drink.  He does not have any other complaints.  Does not provide any other history.  Denies taking any medications at home.   Assessment and Plan:   Acute kidney injury - Likely prerenal etiology given patient's lack of p.o. hydration.  Reordering IV fluids now that patient is on forced medication protocol, however patient is still refusing IV.  Appears to now be accepting some p.o. liquids.  Creatinine mildly improved from yesterday.  Continue to monitor urine output and recheck BMP and magnesium  in AM.   Hypernatremia - Secondary to above.  Noted dehydration with free water deficit.  Reordered fluids NS at 100 mL/h however patient was refusing IV.  Is showing improvement this morning subsequent to patient improving with p.o. intake.  Will recheck sodium in AM.   Religious Delusions/Acute psychosis - Currently stating he needs on a mission from God to not eat or drink.  Psych consulted and following closely.  Continues on forced medication protocol.  Medications per psych.  IV fluids ordered. Continues to be IVC.   Subjective: Patient resting comfortably this morning.  Yesterday had some agitation requiring security but today appears very calm.  Still refusing IV fluids.  Physical Exam:  Vitals:   01/29/24 2010 01/30/24 0810 01/30/24 2022 01/31/24 0337  BP: (!) 123/99 (!) 115/90 131/86 (!) 125/107  Pulse: 69 (!) 59 70 66  Resp: 16 16 16 17   Temp:  97.7 F (36.5 C) 98.8 F  (37.1 C) 98 F (36.7 C)  TempSrc: Oral Oral Oral Oral  SpO2: 99% 100% 98% 99%  Weight:    78 kg  Height:        GENERAL:  Alert, pleasant, no acute distress  HEENT:  EOMI CARDIOVASCULAR:  RRR, no murmurs appreciated RESPIRATORY:  Clear to auscultation, no wheezing, rales, or rhonchi GASTROINTESTINAL:  Soft, nontender, nondistended EXTREMITIES:  No LE edema bilaterally NEURO:  No new focal deficits appreciated SKIN: Dry PSYCH: Calm, conversant, religious delusions.    Data Reviewed:  No new imaging to review  Previous records (including but not limited to H&P, progress notes, nursing notes, TOC management) were reviewed in assessment of this patient.  Labs: CBC: Recent Labs  Lab 01/28/24 1416 01/28/24 1430 01/29/24 0559 01/30/24 0518 01/31/24 0644  WBC 12.3*  --  7.7 9.3 6.7  NEUTROABS 9.6*  --   --   --   --   HGB 14.3 17.0 14.2 13.6 12.9*  HCT 51.1 50.0 50.3 47.2 45.2  MCV 73.4*  --  72.8* 72.6* 73.3*  PLT 509*  --  383 392 278   Basic Metabolic Panel: Recent Labs  Lab 01/28/24 1416 01/28/24 1430 01/29/24 0559 01/30/24 0518 01/31/24 0644  NA 149* 151* 150* 149* 141  K 4.5 4.5 4.6 4.1 3.9  CL 112* 115* 110 112* 106  CO2 23  --  26 26 27   GLUCOSE 121* 120* 101* 57* 103*  BUN 55* 56* 53* 47* 36*  CREATININE 1.89*  1.90* 1.47* 1.32* 1.38*  CALCIUM 9.9  --  10.0 9.6 8.9  MG  --   --   --  3.0* 2.4  PHOS  --   --   --  5.5* 4.1   Liver Function Tests: Recent Labs  Lab 01/28/24 1416  AST 33  ALT 24  ALKPHOS 50  BILITOT 0.8  PROT 9.1*  ALBUMIN 5.0   CBG: Recent Labs  Lab 01/28/24 1606  GLUCAP 113*    Scheduled Meds:  enoxaparin  (LOVENOX ) injection  40 mg Subcutaneous Q24H   ferrous sulfate  325 mg Oral Q breakfast   mupirocin ointment   Topical BID   OLANZapine   10 mg Oral QHS   Or   OLANZapine   10 mg Intramuscular QHS   Continuous Infusions:  sodium chloride      PRN Meds:.acetaminophen  **OR** acetaminophen , bisacodyl , hydrALAZINE ,  metoprolol  tartrate, nicotine , OLANZapine  **OR** OLANZapine , ondansetron  **OR** ondansetron  (ZOFRAN ) IV, oxyCODONE , senna-docusate, sodium phosphate  Family Communication: None at bedside  Disposition: Status is: Observation The patient remains OBS appropriate and will d/c before 2 midnights.     Time spent: 34 minutes  Length of inpatient stay: 0 days  Author: Carliss LELON Canales, DO 01/31/2024 11:01 AM  For on call review [email or web address].

## 2024-01-31 NOTE — Consult Note (Signed)
 [facility] Psychiatric Consult Initial  Patient Name: .Benjamin [name]  MRN: [id number]  DOB: 06-27-1981  Consult Order details:  Orders (From admission, onward)     Start     Ordered   01/28/24 1654  IP CONSULT TO PSYCHIATRY       Ordering Provider: Mannie Jerel PARAS, NP  Provider:  (Not yet assigned)  Question Answer Comment  Location [facility]   Reason for Consult? 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 male admitted 01/28/2024  1:57 PM for poor PO intake. 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   His current presentation of poor PO intake in the setting of psychosis is most consistent with schizophrenia vs bipolar I disorder with psychotic features. He meets criteria for IVC based on danger to self.  Current outpatient psychotropic medications include Abilify  and fluvoxamine  and historically he has had a good response to these medications. He was noncompliant with medications prior to admission as evidenced by collateral report. Please see plan below for detailed recommendations.   Diagnoses:  Active Hospital problems: Principal Problem:   AKI (acute kidney injury) (HCC) Active Problems:   Delusional disorder (HCC)   PTSD (post-traumatic stress disorder)   Hypernatremia    Plan   ## Psychiatric Recommendations:   Continue with the nonemergency forced medications as  prescribed. -This includes Zyprexa  10 mg p.o. or IM if patient refuses p.o. -Continue with the Zyprexa  5 mg p.o. or IM for agitation. -- Appropriate for transfer to inpatient hospital when PO intake resumes. -- Psychiatry will continue to follow.    ## Medical Decision Making Capacity: Patient does not at this time have the capacity to refuse or accept medications d/t poor insight into his current mental state.  ## Further Work-up:  -- UA, UDS, Cr -- most recent EKG on 01/28/2024 had QtC of 424 -- Pertinent labwork reviewed earlier this admission includes: B12 1203, Ammonia  WNL, TSH WNL, TIBC 571, awaiting UDS   ## Disposition:-- We recommend inpatient psychiatric hospitalization after medical hospitalization. Patient has been involuntarily committed on 01/28/2024.   ## Behavioral / Environmental: - No specific recommendations at this time.     ## Safety and Observation Level:  - Based on my clinical evaluation, I estimate the patient to be at moderate risk of self harm in the current setting. - At this time, we recommend  1:1 Observation. This decision is based on my review of the chart including patient's history and current presentation, interview of the patient, mental status examination, and consideration of suicide risk including evaluating suicidal ideation, plan, intent, suicidal or self-harm behaviors, risk factors, and protective factors. This judgment is based on our ability to directly address suicide risk, implement suicide prevention strategies, and develop a safety plan while the patient is in the clinical setting. Please contact our team if there is a concern that risk level has changed.  CSSR  Risk Category:C-SSRS RISK CATEGORY: No Risk  Suicide Risk Assessment: Patient has following modifiable risk factors for suicide: recklessness, medication noncompliance, active mental illness (to encompass adhd, tbi, mania, psychosis, trauma reaction), and recent loss (death, isolation,  vocation), which we are addressing by medication management. Patient has following non-modifiable or demographic risk factors for suicide: male gender, separation or divorce, and psychiatric hospitalization Patient has the following protective factors against suicide: Supportive family, Cultural, spiritual, or religious beliefs that discourage suicide, Minor children in the home, and no history of suicide attempts  Thank you for this consult request. Recommendations have been communicated to the primary team.  We will continue to follow at this time.   PAULETTE BEETS, MD       History of Present Illness  Relevant Aspects of Hospital Course:  Admitted on 01/28/2024 for poor PO intake and psychosis. Family on admission said that patient had neither ate nor drank in two weeks on a mission from god. He is paranoid and does not explain further. Placed under IVC in ED. Did consume 4 cups of PO water but has since refused since being admitted.   Patient Report:  01/28/2024:  On interview, patient is at first reticent to discuss his situation but then explained that he had been newly in touch with God, who commanded him to fast. Says he only presented to the hospital to please my daughter who didn't like that he wasn't eating. Explains that he has recently come in closer communication with god. Says he will neither eat nor drink on God's command. When asked what he believes would happen, and if he's worried that he will die if he does not eat or drink, patient said I'll be OK. Says that he may eat sometime in the future but it won't be today and that the lord will help me. Endorses that he has not been taking his medication and appears confused when asked about abilify . Denies suicidal and homicidal ideation both now and in the past. Denies current substance use. Lives with his children. Does not currently see a psychiatrist or therapist. Says I'm not threatening anyone but that those who oppose  God should watch out. Clarifies that he is not God's instrument and that he is not planning on physically harming anyone. However, says that this provider should remember the golden rule to do unto others, and that that's not what's happening here.   01/31/2024: The patient was seen in follow-up.  He is currently on nonemergency forced medications for his psychiatric symptoms and is also dealing with acute kidney injury, hyponatremia.  He had been refusing medications and food.  However on the nonemergency forced medication protocol, the patient has been taking the oral medications without much resistance.  When seen today initially appeared to be sleepy and withdrawn but was able to respond appropriately.  He maintained fair to poor eye contact.  He had a blunted affect.  His speech was of low volume with some latency.  He continues to endorse the fact that he is on a mission from God and that he has been on this admission all his life.  He claims that he hears God talk to him.  When asked what he hears, he states if you pray about it he will tell you. He is currently on olanzapine  p.o. or IM.  No significant changes noted other than being a little bit calmer.  The plan is to continue her current medication and monitor his symptoms closely.  He appears  to have started taking fluids and has several empty boxes of milk and juice.  In the interview patient asked to get some additional milk.  Psych ROS:  Depression: Denies currently Anxiety:  Denies Mania (lifetime and current): Denies previously. Very grandiose but not hyperverbal, no flight of ideas, not particularly irritable or distractable.  Psychosis: (lifetime and current): Denies, although looks psychotic on exam, staring forward, some speech latency. No RTIS. (Has an extensive past medical history of hypereligiosity, psychosis, and catatonia).   Collateral information:  Contacted Dionne Rossa, Brother at [phone] on  01/29/2024  Helped patient get admitted -- drove down from [place]. Patient is typically a very hardworking individual, great dad with two kids. Ten years ago, patient's wife was a bad person and patient had a psychotic break in 2016. At that time, he had just left his ex-wife and did not speak with anyone for a month. Had lost 70 lbs and not eaten for a month. Completely non-verbal. Hyper-religious: told brother to fast. Was brought up in the church, but this is different from baseline. 3 weeks ago, mother was admitted into hospice. May have been the reason for patient's current psychotic break. At some point over that time stopped taking his meds. Unsure of names, but on an antipsychotic and mood stabilizer. Had severe paranoia (people listening in). If I were to guess, he stopped taking his meds about 1 month ago. Was acting different on fathers day weekend, lots of religious talk. This scared brother -- would leave the kids with brother and go on long drives, all day long. Had been isolating. No firearms and no violence. Certainly not a dangerous person. Girlfriend drove down from [place] . Had to coax him into going into the hospital for the purpose of checking on his vitals. Lost ~30 lbs since father's day, extremely significant. Very similar episode from previous. Believes he will be back to his normal self on his meds. Largest factor: mother moving from nursing home to hospice. He did confirm with brother that he stopped taking his medication. Normally, very productive. Out of the norm.   CONSENTS to forced medication protocol as a substitute decision maker as patient does not currently has medical capacity: needs to be back on his meds.   Review of Systems  All other systems reviewed and are negative.    Psychiatric and Social History  Psychiatric History:  Information collected from chart review, family member.   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 Remote history of Olanzapine .  Previous Med Trials: Trazodone  100 mg at bedtime, Haldol  (not helpful)  Therapy: Unknown  Prior Psych Hospitalization: 01/05/2017 -- 01/16/2017 for an extended phase of catatonia after self-discontinuing abilify  unresponsive to Ativan . Stopped talking, sleeping, eating and drinking. Started at wall, would not verbally communicate. Needed 5 treatments.  Prior Self Harm: Denies Prior Violence: Denies  Family Psych History: Depression in brother Family Hx suicide: Father  Social History:  Educational Hx: Unknown Occupational Hx: Worked as a Therapist, music Hx: Unknown Living Situation: With two teenage children, exwife does not have custody Spiritual Hx: Grew up in a religious christian family, but becomes hyperreligious Access to weapons/lethal means: Brother denies  Substance History Alcohol: Remote, per chart review Tobacco: Remote Illicit drugs: previous Kratom use.  Prescription drug abuse: Unknown Rehab hx: Unknown  Exam Findings  Physical Exam:  Vital Signs:  Temp:  [98 F (36.7 C)-98.8 F (37.1 C)] 98 F (36.7 C) (06/29 0337) Pulse Rate:  [66-70] 66 (06/29 0337) Resp:  [16-17] 17 (06/29 0337) BP: (125-131)/(86-107) 125/107 (06/29 0337) SpO2:  [98 %-99 %] 99 % (06/29 0337) Weight:  [78 kg] 78 kg (06/29 0337) Blood pressure (!) 125/107, pulse 66, temperature 98 F (36.7 C), temperature source Oral, resp. rate 17, height 5' 10 (1.778 m), weight 78 kg, SpO2 99%. Body mass index is 24.67 kg/m.  Physical Exam Vitals reviewed.     Mental Status Exam: General Appearance: Sitting upright with  shirt off in bed, staring forward, disheveled, appears sunburned in face and upper neck area  Orientation:  Oriented to self, situation, place, year  Memory:  Grossly intact  Concentration:  Grossly intact  Recall:  Not assessed   Attention  Intact  Eye Contact:  Occasional, staring forward  Speech:  Some speech latency and slower  Language:  Intact  Volume:  Normal  Mood: I'm OK  Affect:  Restricted  Thought Process:  Linear, goal directed  Thought Content:  Delusions of grandeur  Suicidal Thoughts:  Denies  Homicidal Thoughts:  Denies  Judgement:  None  Insight:  None  Psychomotor Activity:  WNL  Akathisia: WNL  Fund of Knowledge: Unknown      Assets:  supportive family, housing  Cognition:  UTA  ADL's:  Not intact   AIMS (if indicated):        Other History   These have been pulled in through the EMR, reviewed, and updated if appropriate.  Family History:  The patient's family history includes Diabetes Mellitus II in his mother; Seizures in his mother; Stroke in his mother.  Medical History: Past Medical History:  Diagnosis Date   Depression    Hematemesis 08/2017   Herniated disc    x3   Hypertension    Mental disorder     Surgical History: Past Surgical History:  Procedure Laterality Date   ESOPHAGOGASTRODUODENOSCOPY Left 08/27/2017   Procedure: ESOPHAGOGASTRODUODENOSCOPY (EGD);  Surgeon: Rollin Dover, MD;  Location: [facility] ENDOSCOPY;  Service: Endoscopy;  Laterality: Left;   SHOULDER SURGERY     WISDOM TOOTH EXTRACTION       Medications:   Current Facility-Administered Medications:    0.9 %  sodium chloride  infusion, , Intravenous, Continuous, Calkins, Derek W, DO   acetaminophen  (TYLENOL ) tablet 650 mg, 650 mg, Oral, Q6H PRN **OR** acetaminophen  (TYLENOL ) suppository 650 mg, 650 mg, Rectal, Q6H PRN, Amin, Ankit C, MD   bisacodyl  (DULCOLAX) EC tablet 5 mg, 5 mg, Oral, Daily PRN, Amin, Ankit C, MD   enoxaparin  (LOVENOX ) injection 40 mg, 40 mg,  Subcutaneous, Q24H, Amin, Ankit C, MD   ferrous sulfate tablet 325 mg, 325 mg, Oral, Q breakfast, Arlon Honey W, DO, 325 mg at 01/31/24 9045   hydrALAZINE  (APRESOLINE ) injection 10 mg, 10 mg, Intravenous, Q4H PRN, Amin, Ankit C, MD   metoprolol  tartrate (LOPRESSOR ) injection 5 mg, 5 mg, Intravenous, Q4H PRN, Amin, Ankit C, MD   mupirocin ointment (BACTROBAN) 2 %, , Topical, BID, Arlon Honey ORN, DO, Given at 01/31/24 0955   nicotine  (NICODERM CQ  - dosed in mg/24 hours) patch 21 mg, 21 mg, Transdermal, Daily PRN, Amin, Ankit C, MD   OLANZapine  (ZYPREXA ) tablet 10 mg, 10 mg, Oral, QHS, 10  mg at 01/30/24 2232 **OR** OLANZapine  (ZYPREXA ) injection 10 mg, 10 mg, Intramuscular, QHS, Rollene Katz, MD   OLANZapine  (ZYPREXA ) tablet 5 mg, 5 mg, Oral, Q6H PRN **OR** OLANZapine  (ZYPREXA ) injection 5 mg, 5 mg, Intramuscular, Q6H PRN, Rollene Katz, MD   ondansetron  (ZOFRAN ) tablet 4 mg, 4 mg, Oral, Q6H PRN **OR** ondansetron  (ZOFRAN ) injection 4 mg, 4 mg, Intravenous, Q6H PRN, Amin, Ankit C, MD   oxyCODONE  (Oxy IR/ROXICODONE ) immediate release tablet 5 mg, 5 mg, Oral, Q4H PRN, Amin, Ankit C, MD   senna-docusate (Senokot-S) tablet 1 tablet, 1 tablet, Oral, QHS PRN, Amin, Ankit C, MD   sodium phosphate (FLEET) enema 1 enema, 1 enema, Rectal, Once PRN, Amin, Ankit C, MD  Allergies: No Known Allergies  PAULETTE BEETS, MD

## 2024-01-31 NOTE — Plan of Care (Signed)

## 2024-01-31 NOTE — Plan of Care (Signed)
  Problem: Clinical Measurements: Goal: Ability to maintain clinical measurements within normal limits will improve 01/31/2024 0625 by Feliciano Sherleen BRAVO, RN Outcome: Progressing Goal: Will remain free from infection 01/31/2024 0625 by Feliciano Sherleen BRAVO, RN Outcome: Progressing Goal: Diagnostic test results will improve 01/31/2024 0625 by Feliciano Sherleen BRAVO, RN Outcome: Progressing Goal: Respiratory complications will improve 01/31/2024 0625 by Feliciano Sherleen BRAVO, RN Outcome: Progressing Goal: Cardiovascular complication will be avoided 01/31/2024 9374 by Feliciano Sherleen BRAVO, RN Outcome: Progressing   Problem: Activity: Goal: Risk for activity intolerance will decrease 01/31/2024 0625 by Feliciano Sherleen BRAVO, RN Outcome: Progressing   Problem: Coping: Goal: Level of anxiety will decrease 01/31/2024 0625 by Feliciano Sherleen BRAVO, RN Outcome: Progressing  Problem: Elimination: Goal: Will not experience complications related to bowel motility 01/31/2024 0625 by Feliciano Sherleen BRAVO, RN Outcome: Progressing  Goal: Will not experience complications related to urinary retention 01/31/2024 0625 by Feliciano Sherleen BRAVO, RN Outcome: Progressing   Problem: Pain Managment: Goal: General experience of comfort will improve and/or be controlled 01/31/2024 0625 by Feliciano Sherleen BRAVO, RN Outcome: Progressing   Problem: Safety: Goal: Ability to remain free from injury will improve 01/31/2024 0625 by Feliciano Sherleen BRAVO, RN Outcome: Progressing  Problem: Skin Integrity: Goal: Risk for impaired skin integrity will decrease 01/31/2024 0625 by Feliciano Sherleen BRAVO, RN Outcome: Progressing

## 2024-02-01 LAB — BASIC METABOLIC PANEL WITH GFR
Anion gap: 10 (ref 5–15)
BUN: 32 mg/dL — ABNORMAL HIGH (ref 6–20)
CO2: 26 mmol/L (ref 22–32)
Calcium: 9.3 mg/dL (ref 8.9–10.3)
Chloride: 108 mmol/L (ref 98–111)
Creatinine, Ser: 1.27 mg/dL — ABNORMAL HIGH (ref 0.61–1.24)
GFR, Estimated: >60 mL/min (ref >60)
Glucose, Bld: 100 mg/dL — ABNORMAL HIGH (ref 70–99)
Potassium: 4 mmol/L (ref 3.5–5.1)
Sodium: 144 mmol/L (ref 135–145)

## 2024-02-01 LAB — MAGNESIUM: Magnesium: 2.6 mg/dL — ABNORMAL HIGH (ref 1.7–2.4)

## 2024-02-01 LAB — PHOSPHORUS: Phosphorus: 4 mg/dL (ref 2.5–4.6)

## 2024-02-01 MED ORDER — LORAZEPAM 1 MG PO TABS
2.0000 mg | ORAL_TABLET | Freq: Every day | ORAL | Status: DC
  Administered 2024-02-01: 2 mg via ORAL
  Filled 2024-02-01: qty 2

## 2024-02-01 MED ORDER — OLANZAPINE 10 MG IM SOLR
10.0000 mg | Freq: Every day | INTRAMUSCULAR | Status: DC
  Filled 2024-02-01: qty 10

## 2024-02-01 MED ORDER — LORAZEPAM 2 MG/ML IJ SOLN: 2.0000 mg | Freq: Every day | INTRAMUSCULAR | Status: DC

## 2024-02-01 MED ORDER — OLANZAPINE 10 MG PO TABS
10.0000 mg | ORAL_TABLET | Freq: Every day | ORAL | Status: DC
  Administered 2024-02-01: 10 mg via ORAL
  Filled 2024-02-01: qty 1

## 2024-02-01 NOTE — Consult Note (Signed)
 [facility] Psychiatric Consult Initial  Patient Name: .Benjamin [name]  MRN: [id number]  DOB: 22-Mar-1981  Consult Order details:  Orders (From admission, onward)     Start     Ordered   01/28/24 1654  IP CONSULT TO PSYCHIATRY       Ordering Provider: Mannie Jerel PARAS, NP  Provider:  (Not yet assigned)  Question Answer Comment  Location [facility]   Reason for Consult? Psychosis, refusal to eat      01/28/24 1654             Mode of Visit: In person    Psychiatry Consult Evaluation  Service Date: February 01, 2024 LOS:  LOS: 0 days  Chief Complaint Psychosis  Primary Psychiatric Diagnoses  Psychosis, unspecified: schizophrenia vs bipolar I disorder, in current episode, with psychotic features   Assessment  Benjamin [name] is a 43 y.o. male admitted 01/28/2024  1:57 PM for poor PO intake. 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   His current presentation of poor PO intake in the setting of psychosis is most consistent with schizophrenia vs bipolar I disorder with psychotic features. He meets criteria for IVC based on danger to self.  Current outpatient psychotropic medications include Abilify  and fluvoxamine  and historically he has had a good response to these medications. He was noncompliant with medications prior to admission as evidenced by collateral report. Please see plan below for detailed recommendations.   Diagnoses:  Active Hospital problems: Principal Problem:   AKI (acute kidney injury) (HCC) Active Problems:   Delusional disorder (HCC)   PTSD (post-traumatic stress disorder)   Hypernatremia    Plan   ## Psychiatric Recommendations:   Patient was selectively mute this AM, speaking with  attending physician Dr. Larina but not with this Clinical research associate. Had written request for 2% milk or grape juice and handed this to sitter. Appears to be getting some PO intake down, at least -- his previous episodes of psychosis are often accompanied by hyperreligiosity, fasting, and catatonic-appearing symptoms (mutism). While he does not meet criteria for catatonia for these reasons, patient remains in acute psychosis and will need more time on current medication.  Has had variable efficacy with ativan  in the past (has not worked at times, but has attested that ativan  helps him snap out of these catatonic states.) If patient sees some benefit overnight can schedule more frequently.   -- Start Ativan  2 mg at bedtime PO vs IM if patient refuses PO. -- Continue Zyprexa  10 mg at bedtime PO or IM if patient refuses p.o. -- Continue Zyprexa  5 mg q6h PO or IM for agitation. -- Appropriate for transfer to inpatient hospital when PO intake resumes. -- Psychiatry will continue to follow.    ## Medical Decision Making Capacity: Patient does not at this time have the capacity to refuse or accept medications d/t poor insight into his current mental state.  ## Further Work-up:  -- UA, UDS, Cr -- most recent EKG on 01/28/2024 had QtC of 424 -- Pertinent labwork reviewed earlier this admission includes: B12 1203, Ammonia  WNL, TSH WNL, TIBC 571, awaiting UDS   ## Disposition:-- We recommend inpatient psychiatric hospitalization after medical hospitalization. Patient has been involuntarily committed on 01/28/2024.   ## Behavioral / Environmental: - No specific recommendations at this time.     ##  Safety and Observation Level:  - Based on my clinical evaluation, I estimate the patient to be at moderate risk of self harm in the current setting. - At this time, we recommend  1:1 Observation. This decision is based on my review of the chart including patient's history and current presentation, interview of the patient,  mental status examination, and consideration of suicide risk including evaluating suicidal ideation, plan, intent, suicidal or self-harm behaviors, risk factors, and protective factors. This judgment is based on our ability to directly address suicide risk, implement suicide prevention strategies, and develop a safety plan while the patient is in the clinical setting. Please contact our team if there is a concern that risk level has changed.  CSSR Risk Category:C-SSRS RISK CATEGORY: No Risk  Suicide Risk Assessment: Patient has following modifiable risk factors for suicide: recklessness, medication noncompliance, active mental illness (to encompass adhd, tbi, mania, psychosis, trauma reaction), and recent loss (death, isolation, vocation), which we are addressing by medication management. Patient has following non-modifiable or demographic risk factors for suicide: male gender, separation or divorce, and psychiatric hospitalization Patient has the following protective factors against suicide: Supportive family, Cultural, spiritual, or religious beliefs that discourage suicide, Minor children in the home, and no history of suicide attempts  Thank you for this consult request. Recommendations have been communicated to the primary team.  We will continue to follow at this time.   Charleene Callegari, MD       History of Present Illness  Relevant Aspects of Hospital Course:  Admitted on 01/28/2024 for poor PO intake and psychosis. Family on admission said that patient had neither ate nor drank in two weeks on a mission from god. He is paranoid and does not explain further. Placed under IVC in ED. Did consume 4 cups of PO water but has since refused since being admitted.   Patient Report:  01/28/2024:  On interview, patient is at first reticent to discuss his situation but then explained that he had been newly in touch with God, who commanded him to fast. Says he only presented to the hospital to  please my daughter who didn't like that he wasn't eating. Explains that he has recently come in closer communication with god. Says he will neither eat nor drink on God's command. When asked what he believes would happen, and if he's worried that he will die if he does not eat or drink, patient said I'll be OK. Says that he may eat sometime in the future but it won't be today and that the lord will help me. Endorses that he has not been taking his medication and appears confused when asked about abilify . Denies suicidal and homicidal ideation both now and in the past. Denies current substance use. Lives with his children. Does not currently see a psychiatrist or therapist. Says I'm not threatening anyone but that those who oppose God should watch out. Clarifies that he is not God's instrument and that he is not planning on physically harming anyone. However, says that this provider should remember the golden rule to do unto others, and that that's not what's happening here.   01/31/2024: The patient was seen in follow-up.  He is currently on nonemergency forced medications for his psychiatric symptoms and is also dealing with acute kidney injury, hyponatremia.  He had been refusing medications and food.  However on the nonemergency forced medication protocol, the patient has been taking the oral medications without much resistance.  When seen today initially appeared to  be sleepy and withdrawn but was able to respond appropriately.  He maintained fair to poor eye contact.  He had a blunted affect.  His speech was of low volume with some latency.  He continues to endorse the fact that he is on a mission from God and that he has been on this admission all his life.  He claims that he hears God talk to him.  When asked what he hears, he states if you pray about it he will tell you. He is currently on olanzapine  p.o. or IM.  No significant changes noted other than being a little bit calmer.  The  plan is to continue her current medication and monitor his symptoms closely.  He appears to have started taking fluids and has several empty boxes of milk and juice.  In the interview patient asked to get some additional milk.  02/01/2024:   This AM, patient was laying to his left and opened his eyes to repeated physical touch. Stared blankly at interviewer. No response to various questions. Blinking normally. Would later speak frankly with attending physician. No echolalia, echopraxia noted. No posturing noted. No mannerisms or sterotypy noted. No agitation or external stimuli. Was having a conversation with sitter earlier in the AM.   Psych ROS:  Depression: Denies currently Anxiety:  Denies Mania (lifetime and current): Denies previously. Very grandiose but not hyperverbal, no flight of ideas, not particularly irritable or distractable.  Psychosis: (lifetime and current): Denies, although looks psychotic on exam, staring forward, some speech latency. No RTIS. (Has an extensive past medical history of hypereligiosity, psychosis, and catatonia).   Collateral information:  Contacted Bilaal Leib, Brother at [phone] on 01/29/2024  Helped patient get admitted -- drove down from [place]. Patient is typically a very hardworking individual, great dad with two kids. Ten years ago, patient's wife was a bad person and patient had a psychotic break in 2016. At that time, he had just left his ex-wife and did not speak with anyone for a month. Had lost 70 lbs and not eaten for a month. Completely non-verbal. Hyper-religious: told brother to fast. Was brought up in the church, but this is different from baseline. 3 weeks ago, mother was admitted into hospice. May have been the reason for patient's current psychotic break. At some point over that time stopped taking his meds. Unsure of names, but on an antipsychotic and mood stabilizer. Had severe paranoia (people listening in). If I were to guess, he stopped  taking his meds about 1 month ago. Was acting different on fathers day weekend, lots of religious talk. This scared brother -- would leave the kids with brother and go on long drives, all day long. Had been isolating. No firearms and no violence. Certainly not a dangerous person. Girlfriend drove down from [place] . Had to coax him into going into the hospital for the purpose of checking on his vitals. Lost ~30 lbs since father's day, extremely significant. Very similar episode from previous. Believes he will be back to his normal self on his meds. Largest factor: mother moving from nursing home to hospice. He did confirm with brother that he stopped taking his medication. Normally, very productive. Out of the norm.   CONSENTS to forced medication protocol as a substitute decision maker as patient does not currently has medical capacity: needs to be back on his meds.   Review of Systems  All other systems reviewed and are negative.    Psychiatric and Social History  Psychiatric History:  Information collected from chart review, family member.   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 Remote history of Olanzapine .  Previous Med Trials: Trazodone  100 mg at bedtime, Haldol  (not helpful)  Therapy: Unknown  Prior Psych Hospitalization: 01/05/2017 -- 01/16/2017 for an extended phase of catatonia after self-discontinuing abilify  unresponsive to Ativan . Stopped talking, sleeping, eating and drinking. Started at wall, would not verbally communicate. Needed 5 treatments.  Prior Self Harm: Denies Prior Violence:  Denies  Family Psych History: Depression in brother Family Hx suicide: Father  Social History:  Educational Hx: Unknown Occupational Hx: Worked as a Therapist, music Hx: Unknown Living Situation: With two teenage children, exwife does not have custody Spiritual Hx: Grew up in a religious christian family, but becomes hyperreligious Access to weapons/lethal means: Brother denies  Substance History Alcohol: Remote, per chart review Tobacco: Remote Illicit drugs: previous Kratom use.  Prescription drug abuse: Unknown Rehab hx: Unknown  Exam Findings  Physical Exam:  Vital Signs:  Temp:  [98.6 F (37 C)] 98.6 F (37 C) (06/30 0457) Pulse Rate:  [51] 51 (06/30 0457) Resp:  [17] 17 (06/30 0457) BP: (120)/(86) 120/86 (06/30 0457) SpO2:  [100 %] 100 % (06/30 0457) Weight:  [76.4 kg] 76.4 kg (06/30 0500) Blood pressure 120/86, pulse (!) 51, temperature 98.6 F (37 C), resp. rate 17, height 5' 10 (1.778 m), weight 76.4 kg, SpO2 100%. Body mass index is 24.17 kg/m.  Physical Exam Vitals reviewed.     Mental Status Exam: General Appearance:Laying in bed, shirt off, in scrubs.   Orientation:  UTA  Memory:  UTA  Concentration:  UTA  Recall:  UTA  Attention  UTA  Eye Contact:  UTA  Speech:  UTA  Language:  UTA  Volume:  UTA  Mood: UTA  Affect:  UTA  Thought Process: UTA  Thought Content: UTA  Suicidal Thoughts: UTA  Homicidal Thoughts: UTA  Judgement:  UTA  Insight:  UTA  Psychomotor Activity:  UTA  Akathisia: UTA  Fund of Knowledge: UTA      Assets:  supportive family, housing  Cognition:  UTA  ADL's:  Not intact   AIMS (if indicated):        Other History   These have been pulled in through the EMR, reviewed, and updated if appropriate.  Family History:  The patient's family history includes Diabetes Mellitus II in his mother; Seizures in his mother; Stroke in his mother.  Medical History: Past Medical History:  Diagnosis Date   Depression     Hematemesis 08/2017   Herniated disc    x3   Hypertension    Mental disorder     Surgical History: Past Surgical History:  Procedure Laterality Date   ESOPHAGOGASTRODUODENOSCOPY Left 08/27/2017   Procedure: ESOPHAGOGASTRODUODENOSCOPY (EGD);  Surgeon: Rollin Dover, MD;  Location[facility]COPY;  Service: Endoscopy;  Laterality: Left;   SHOULDER SURGERY     WISDOM TOOTH EXTRACTION       Medications:   Current Facility-Administered Medications:    acetaminophen  (TYLENOL ) tablet 650 mg, 650 mg, Oral, Q6H PRN **OR** acetaminophen  (TYLENOL ) suppository 650 mg, 650 mg, Rectal, Q6H PRN, Amin, Ankit C, MD   bisacodyl  (DULCOLAX) EC  tablet 5 mg, 5 mg, Oral, Daily PRN, Amin, Ankit C, MD   enoxaparin  (LOVENOX ) injection 40 mg, 40 mg, Subcutaneous, Q24H, Amin, Ankit C, MD   ferrous sulfate tablet 325 mg, 325 mg, Oral, Q breakfast, Arlon Honey W, DO, 325 mg at 01/31/24 9045   hydrALAZINE  (APRESOLINE ) injection 10 mg, 10 mg, Intravenous, Q4H PRN, Amin, Ankit C, MD   metoprolol  tartrate (LOPRESSOR ) injection 5 mg, 5 mg, Intravenous, Q4H PRN, Amin, Ankit C, MD   mupirocin ointment (BACTROBAN) 2 %, , Topical, BID, Arlon Honey ORN, DO, Given at 01/31/24 9044   nicotine  (NICODERM CQ  - dosed in mg/24 hours) patch 21 mg, 21 mg, Transdermal, Daily PRN, Amin, Ankit C, MD   OLANZapine  (ZYPREXA ) tablet 5 mg, 5 mg, Oral, Q6H PRN **OR** OLANZapine  (ZYPREXA ) injection 5 mg, 5 mg, Intramuscular, Q6H PRN, Rollene Katz, MD   ondansetron  (ZOFRAN ) tablet 4 mg, 4 mg, Oral, Q6H PRN **OR** ondansetron  (ZOFRAN ) injection 4 mg, 4 mg, Intravenous, Q6H PRN, Amin, Ankit C, MD   oxyCODONE  (Oxy IR/ROXICODONE ) immediate release tablet 5 mg, 5 mg, Oral, Q4H PRN, Amin, Ankit C, MD   senna-docusate (Senokot-S) tablet 1 tablet, 1 tablet, Oral, QHS PRN, Amin, Ankit C, MD   sodium phosphate (FLEET) enema 1 enema, 1 enema, Rectal, Once PRN, Amin, Ankit C, MD  Allergies: No Known Allergies  Tobie Hellen, MD

## 2024-02-01 NOTE — Progress Notes (Signed)
 Progress Note   Patient: Benjamin [name] FMW:[id number] DOB: [birth year] DOA: 01/28/2024  DOS: the patient was seen and examined on 02/01/2024   Brief hospital course:  43 y.o. male with medical history significant of depression, psychosis, PTSD, OCD, marijuana use admitted to the hospital by the family due to mental health concerns.  According to the family and the patient he has not eaten anything in over 2 weeks and has lost some weight.  Patient keeps telling me that he is on admission from God not to eat and drink.  He does not have any other complaints.  Does not provide any other history.  Denies taking any medications at home.   Assessment and Plan:   Acute kidney injury - Likely prerenal etiology given patient's lack of p.o. hydration.  Reordering IV fluids now that patient is on forced medication protocol, however patient is still refusing IV.  Appears to now be accepting some p.o. liquids.  Creatinine mildly improved from yesterday.  Continue to monitor urine output and recheck BMP and magnesium  in AM.   Hypernatremia - Secondary to above.  Noted dehydration with free water deficit.  Reordered fluids NS at 100 mL/h however patient was refusing IV.  Is showing improvement this morning subsequent to patient improving with p.o. intake.  Will recheck sodium in AM.   Religious Delusions/Acute psychosis - Currently stating he needs on a mission from God to not eat or drink.  Psych consulted and following closely.  Continues on forced medication protocol.  Medications per psych.  IV fluids ordered. Continues to be IVC.   Subjective: Patient resting comfortably this morning.  Reportedly drinking milk and grape juice refusing IV.  Has not speaking or responding this morning but is alert.  Physical Exam:  Vitals:   01/31/24 0337 02/01/24 0457 02/01/24 0500 02/01/24 0853  BP: (!) 125/107 120/86  121/86  Pulse: 66 (!) 51  66  Resp: 17 17  18   Temp: 98 F (36.7 C) 98.6 F (37 C)  97.7  F (36.5 C)  TempSrc: Oral   Oral  SpO2: 99% 100%  99%  Weight: 78 kg  76.4 kg   Height:        GENERAL:  Alert, pleasant, no acute distress  HEENT:  EOMI CARDIOVASCULAR:  RRR, no murmurs appreciated RESPIRATORY:  Clear to auscultation, no wheezing, rales, or rhonchi GASTROINTESTINAL:  Soft, nontender, nondistended EXTREMITIES:  No LE edema bilaterally NEURO:  No new focal deficits appreciated SKIN: Dry PSYCH: Calm, selectively mute   Data Reviewed:  No new imaging  Previous records (including but not limited to H&P, progress notes, nursing notes, TOC management) were reviewed in assessment of this patient.  Labs: CBC: Recent Labs  Lab 01/28/24 1416 01/28/24 1430 01/29/24 0559 01/30/24 0518 01/31/24 0644  WBC 12.3*  --  7.7 9.3 6.7  NEUTROABS 9.6*  --   --   --   --   HGB 14.3 17.0 14.2 13.6 12.9*  HCT 51.1 50.0 50.3 47.2 45.2  MCV 73.4*  --  72.8* 72.6* 73.3*  PLT 509*  --  383 392 278   Basic Metabolic Panel: Recent Labs  Lab 01/28/24 1416 01/28/24 1430 01/29/24 0559 01/30/24 0518 01/31/24 0644 02/01/24 0617  NA 149* 151* 150* 149* 141 144  K 4.5 4.5 4.6 4.1 3.9 4.0  CL 112* 115* 110 112* 106 108  CO2 23  --  26 26 27 26   GLUCOSE 121* 120* 101* 57* 103* 100*  BUN 55* 56* 53*  47* 36* 32*  CREATININE 1.89* 1.90* 1.47* 1.32* 1.38* 1.27*  CALCIUM 9.9  --  10.0 9.6 8.9 9.3  MG  --   --   --  3.0* 2.4 2.6*  PHOS  --   --   --  5.5* 4.1 4.0   Liver Function Tests: Recent Labs  Lab 01/28/24 1416  AST 33  ALT 24  ALKPHOS 50  BILITOT 0.8  PROT 9.1*  ALBUMIN 5.0   CBG: Recent Labs  Lab 01/28/24 1606  GLUCAP 113*    Scheduled Meds:  enoxaparin  (LOVENOX ) injection  40 mg Subcutaneous Q24H   ferrous sulfate  325 mg Oral Q breakfast   LORazepam   2 mg Oral QHS   Or   LORazepam   2 mg Intramuscular QHS   mupirocin ointment   Topical BID   OLANZapine   10 mg Oral QHS   Or   OLANZapine   10 mg Intramuscular QHS   Continuous Infusions: PRN  Meds:.acetaminophen  **OR** acetaminophen , bisacodyl , hydrALAZINE , metoprolol  tartrate, nicotine , OLANZapine  **OR** OLANZapine , ondansetron  **OR** ondansetron  (ZOFRAN ) IV, oxyCODONE , senna-docusate, sodium phosphate  Family Communication: None at bedside  Disposition: Status is: Observation The patient remains OBS appropriate and will d/c before 2 midnights.     Time spent: 32 minutes  Length of inpatient stay: 0 days  Author: Carliss LELON Canales, DO 02/01/2024 1:01 PM  For on call review [email or web address].

## 2024-02-01 NOTE — TOC Initial Note (Addendum)
 Transition of Care [facility]) - Initial/Assessment Note    Patient Details  Name: Benjamin [name] MRN: [id number] Date of Birth: Jun 15, 1981  [facility] CM/SW Contact:    Isaiah Public, LCSWA Phone Number: 02/01/2024, 2:06 PM  Clinical Narrative:                  Patient is from home. Psychiatry currently following and recommending inpatient psychiatric hospitalization after medical hospitalization. Patient  IVC'd on 01/28/2024. CSW following to fax out for inpatient psychiatry once patient medically cleared by MD.TOC will continue to follow.       Patient Goals and CMS Choice            Expected Discharge Plan and Services                                              Prior Living Arrangements/Services                       Activities of Daily Living   ADL Screening (condition at time of admission) Independently performs ADLs?: Yes (appropriate for developmental age) Is the patient deaf or have difficulty hearing?: No Does the patient have difficulty seeing, even when wearing glasses/contacts?: No Does the patient have difficulty concentrating, remembering, or making decisions?: No  Permission Sought/Granted                  Emotional Assessment              Admission diagnosis:  AKI (acute kidney injury) (HCC) [N17.9] Patient Active Problem List   Diagnosis Date Noted   Hypernatremia 01/31/2024   Altered mental status, unspecified 10/27/2019   Anemia, blood loss    Hematemesis 08/26/2017   Hypotension 08/26/2017   AKI (acute kidney injury) (HCC) 08/26/2017   Diarrhea 08/26/2017   Rash 08/26/2017   SIRS (systemic inflammatory response syndrome) (HCC) 08/26/2017   Cannabis use disorder, moderate, dependence (HCC) 01/09/2017   MDD (major depressive disorder), recurrent, with catatonic features (HCC) 08/09/2016   Severe recurrent major depression with psychotic features (HCC) 07/21/2016   MDD (major depressive disorder)  07/21/2016   PTSD (post-traumatic stress disorder) 07/15/2016   OCD (obsessive compulsive disorder) 07/15/2016   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 [place] [address] Phone: [phone] Fax: [phone]  [id number] [place], [place] - [address] AT [facility] [address] [place] [address] Phone: [phone] Fax: [phone]  [id number] [place] [facility] - [address] [place] [address] Phone: [phone] Fax: [phone]     Social Drivers of Health (SDOH) Social History: SDOH Screenings   Food Insecurity: Food Insecurity Present (01/28/2024)  Housing: Unknown (01/28/2024)  Transportation Needs: No Transportation Needs (01/28/2024)  Utilities: At Risk (01/28/2024)  Alcohol Screen: Low Risk  (06/11/2017)  Social Connections: Unknown (01/28/2024)  Tobacco Use: Medium Risk (01/28/2024)   SDOH Interventions:  Readmission Risk Interventions     No data to display

## 2024-02-01 NOTE — Plan of Care (Signed)
  Problem: Education: Goal: Knowledge of General Education information will improve Description: Including pain rating scale, medication(s)/side effects and non-pharmacologic comfort measures 02/01/2024 1903 by Hildred Jon POUR, RN Outcome: Not Progressing 02/01/2024 1903 by Hildred Jon POUR, RN Outcome: Progressing   Problem: Health Behavior/Discharge Planning: Goal: Ability to manage health-related needs will improve 02/01/2024 1903 by Hildred Jon POUR, RN Outcome: Not Progressing 02/01/2024 1903 by Hildred Jon POUR, RN Outcome: Progressing   Problem: Clinical Measurements: Goal: Ability to maintain clinical measurements within normal limits will improve 02/01/2024 1903 by Hildred Jon POUR, RN Outcome: Not Progressing 02/01/2024 1903 by Hildred Jon POUR, RN Outcome: Progressing Goal: Will remain free from infection 02/01/2024 1903 by Hildred Jon POUR, RN Outcome: Not Progressing 02/01/2024 1903 by Hildred Jon POUR, RN Outcome: Progressing Goal: Diagnostic test results will improve 02/01/2024 1903 by Hildred Jon POUR, RN Outcome: Not Progressing 02/01/2024 1903 by Hildred Jon POUR, RN Outcome: Progressing Goal: Respiratory complications will improve 02/01/2024 1903 by Hildred Jon POUR, RN Outcome: Not Progressing 02/01/2024 1903 by Hildred Jon POUR, RN Outcome: Progressing Goal: Cardiovascular complication will be avoided 02/01/2024 1903 by Hildred Jon POUR, RN Outcome: Not Progressing 02/01/2024 1903 by Hildred Jon POUR, RN Outcome: Progressing   Problem: Activity: Goal: Risk for activity intolerance will decrease 02/01/2024 1903 by Hildred Jon POUR, RN Outcome: Not Progressing 02/01/2024 1903 by Hildred Jon POUR, RN Outcome: Progressing   Problem: Nutrition: Goal: Adequate nutrition will be maintained 02/01/2024 1903 by Hildred Jon POUR, RN Outcome: Not Progressing 02/01/2024 1903 by Hildred Jon POUR, RN Outcome: Progressing   Problem: Coping: Goal: Level of anxiety will decrease 02/01/2024  1903 by Hildred Jon POUR, RN Outcome: Not Progressing 02/01/2024 1903 by Hildred Jon POUR, RN Outcome: Progressing   Problem: Elimination: Goal: Will not experience complications related to bowel motility 02/01/2024 1903 by Hildred Jon POUR, RN Outcome: Not Progressing 02/01/2024 1903 by Hildred Jon POUR, RN Outcome: Progressing Goal: Will not experience complications related to urinary retention 02/01/2024 1903 by Hildred Jon POUR, RN Outcome: Not Progressing 02/01/2024 1903 by Hildred Jon POUR, RN Outcome: Progressing   Problem: Pain Managment: Goal: General experience of comfort will improve and/or be controlled 02/01/2024 1903 by Hildred Jon POUR, RN Outcome: Not Progressing 02/01/2024 1903 by Hildred Jon POUR, RN Outcome: Progressing   Problem: Safety: Goal: Ability to remain free from injury will improve 02/01/2024 1903 by Hildred Jon POUR, RN Outcome: Not Progressing 02/01/2024 1903 by Hildred Jon POUR, RN Outcome: Progressing   Problem: Skin Integrity: Goal: Risk for impaired skin integrity will decrease 02/01/2024 1903 by Hildred Jon POUR, RN Outcome: Not Progressing 02/01/2024 1903 by Hildred Jon POUR, RN Outcome: Progressing

## 2024-02-01 NOTE — Plan of Care (Signed)

## 2024-02-02 ENCOUNTER — Inpatient Hospital Stay (HOSPITAL_COMMUNITY)
Admission: AD | Admit: 2024-02-02 | Discharge: 2024-02-10 | DRG: 885 | Disposition: A | Discharge status: Home / Self Care | Source: Intra-hospital | Attending: Psychiatry | Admitting: Psychiatry

## 2024-02-02 ENCOUNTER — Encounter (HOSPITAL_COMMUNITY): Payer: Self-pay

## 2024-02-02 ENCOUNTER — Other Ambulatory Visit: Payer: Self-pay

## 2024-02-02 DIAGNOSIS — E876 Hypokalemia: Secondary | ICD-10-CM | POA: Diagnosis present

## 2024-02-02 DIAGNOSIS — F429 Obsessive-compulsive disorder, unspecified: Secondary | ICD-10-CM | POA: Diagnosis present

## 2024-02-02 DIAGNOSIS — R634 Abnormal weight loss: Secondary | ICD-10-CM | POA: Diagnosis present

## 2024-02-02 DIAGNOSIS — Z87891 Personal history of nicotine dependence: Secondary | ICD-10-CM

## 2024-02-02 DIAGNOSIS — Z823 Family history of stroke: Secondary | ICD-10-CM

## 2024-02-02 DIAGNOSIS — F4310 Post-traumatic stress disorder, unspecified: Secondary | ICD-10-CM | POA: Diagnosis present

## 2024-02-02 DIAGNOSIS — F333 Major depressive disorder, recurrent, severe with psychotic symptoms: Principal | ICD-10-CM | POA: Diagnosis present

## 2024-02-02 DIAGNOSIS — F909 Attention-deficit hyperactivity disorder, unspecified type: Secondary | ICD-10-CM | POA: Diagnosis present

## 2024-02-02 DIAGNOSIS — F061 Catatonic disorder due to known physiological condition: Principal | ICD-10-CM | POA: Diagnosis present

## 2024-02-02 DIAGNOSIS — Z833 Family history of diabetes mellitus: Secondary | ICD-10-CM

## 2024-02-02 DIAGNOSIS — F940 Selective mutism: Secondary | ICD-10-CM | POA: Diagnosis present

## 2024-02-02 DIAGNOSIS — Z6824 Body mass index (BMI) 24.0-24.9, adult: Secondary | ICD-10-CM | POA: Diagnosis not present

## 2024-02-02 DIAGNOSIS — Z5948 Other specified lack of adequate food: Secondary | ICD-10-CM | POA: Diagnosis not present

## 2024-02-02 DIAGNOSIS — Z91128 Patient's intentional underdosing of medication regimen for other reason: Secondary | ICD-10-CM

## 2024-02-02 DIAGNOSIS — N179 Acute kidney failure, unspecified: Secondary | ICD-10-CM | POA: Diagnosis not present

## 2024-02-02 DIAGNOSIS — Z79899 Other long term (current) drug therapy: Secondary | ICD-10-CM

## 2024-02-02 DIAGNOSIS — Z5941 Food insecurity: Secondary | ICD-10-CM | POA: Diagnosis not present

## 2024-02-02 DIAGNOSIS — I10 Essential (primary) hypertension: Secondary | ICD-10-CM | POA: Diagnosis present

## 2024-02-02 DIAGNOSIS — Z818 Family history of other mental and behavioral disorders: Secondary | ICD-10-CM

## 2024-02-02 DIAGNOSIS — F419 Anxiety disorder, unspecified: Secondary | ICD-10-CM | POA: Diagnosis present

## 2024-02-02 DIAGNOSIS — F29 Unspecified psychosis not due to a substance or known physiological condition: Secondary | ICD-10-CM | POA: Diagnosis not present

## 2024-02-02 LAB — BASIC METABOLIC PANEL WITH GFR
Anion gap: 7 (ref 5–15)
BUN: 28 mg/dL — ABNORMAL HIGH (ref 6–20)
CO2: 28 mmol/L (ref 22–32)
Calcium: 8.9 mg/dL (ref 8.9–10.3)
Chloride: 105 mmol/L (ref 98–111)
Creatinine, Ser: 1.24 mg/dL (ref 0.61–1.24)
GFR, Estimated: >60 mL/min (ref >60)
Glucose, Bld: 78 mg/dL (ref 70–99)
Potassium: 3.3 mmol/L — ABNORMAL LOW (ref 3.5–5.1)
Sodium: 140 mmol/L (ref 135–145)

## 2024-02-02 MED ORDER — DIPHENHYDRAMINE HCL 50 MG/ML IJ SOLN
50.0000 mg | Freq: Three times a day (TID) | INTRAMUSCULAR | Status: DC | PRN
  Administered 2024-02-05: 50 mg via INTRAMUSCULAR
  Filled 2024-02-02: qty 1

## 2024-02-02 MED ORDER — HALOPERIDOL LACTATE 5 MG/ML IJ SOLN
10.0000 mg | Freq: Three times a day (TID) | INTRAMUSCULAR | Status: DC | PRN
Start: 2024-02-02 — End: 2024-02-10

## 2024-02-02 MED ORDER — LORAZEPAM 2 MG/ML IJ SOLN
2.0000 mg | Freq: Three times a day (TID) | INTRAMUSCULAR | Status: DC | PRN
  Administered 2024-02-03: 2 mg via INTRAMUSCULAR

## 2024-02-02 MED ORDER — OLANZAPINE 10 MG IM SOLR: 5.0000 mg | Freq: Four times a day (QID) | INTRAMUSCULAR | Status: DC | PRN

## 2024-02-02 MED ORDER — DIPHENHYDRAMINE HCL 25 MG PO CAPS
50.0000 mg | ORAL_CAPSULE | Freq: Three times a day (TID) | ORAL | Status: DC | PRN
  Filled 2024-02-02: qty 2

## 2024-02-02 MED ORDER — OLANZAPINE 10 MG PO TABS
10.0000 mg | ORAL_TABLET | Freq: Every day | ORAL | Status: DC
  Filled 2024-02-02: qty 1

## 2024-02-02 MED ORDER — LORAZEPAM 2 MG/ML IJ SOLN: 2.0000 mg | Freq: Once | INTRAMUSCULAR | Status: DC

## 2024-02-02 MED ORDER — DIPHENHYDRAMINE HCL 50 MG/ML IJ SOLN
50.0000 mg | Freq: Three times a day (TID) | INTRAMUSCULAR | Status: DC | PRN
  Administered 2024-02-03: 50 mg via INTRAMUSCULAR
  Filled 2024-02-02: qty 1

## 2024-02-02 MED ORDER — OLANZAPINE 5 MG PO TBDP
5.0000 mg | ORAL_TABLET | Freq: Four times a day (QID) | ORAL | Status: DC | PRN
  Filled 2024-02-02: qty 1

## 2024-02-02 MED ORDER — OLANZAPINE 10 MG IM SOLR
10.0000 mg | Freq: Every day | INTRAMUSCULAR | Status: DC
  Filled 2024-02-02: qty 10

## 2024-02-02 MED ORDER — ACETAMINOPHEN 325 MG PO TABS: 650.0000 mg | ORAL_TABLET | Freq: Four times a day (QID) | ORAL | Status: DC | PRN

## 2024-02-02 MED ORDER — HALOPERIDOL LACTATE 5 MG/ML IJ SOLN
5.0000 mg | Freq: Three times a day (TID) | INTRAMUSCULAR | Status: DC | PRN
  Administered 2024-02-03 – 2024-02-05 (×2): 5 mg via INTRAMUSCULAR
  Filled 2024-02-02: qty 1

## 2024-02-02 MED ORDER — LORAZEPAM 2 MG/ML IJ SOLN
2.0000 mg | Freq: Three times a day (TID) | INTRAMUSCULAR | Status: DC | PRN
Start: 2024-02-02 — End: 2024-02-10
  Administered 2024-02-05: 2 mg via INTRAMUSCULAR
  Filled 2024-02-02 (×2): qty 1

## 2024-02-02 MED ORDER — ALUM & MAG HYDROXIDE-SIMETH 200-200-20 MG/5ML PO SUSP: 30.0000 mL | ORAL | Status: DC | PRN

## 2024-02-02 MED ORDER — HALOPERIDOL 5 MG PO TABS
5.0000 mg | ORAL_TABLET | Freq: Three times a day (TID) | ORAL | Status: DC | PRN
  Filled 2024-02-02: qty 1

## 2024-02-02 MED ORDER — ENSURE PLUS HIGH PROTEIN PO LIQD
237.0000 mL | Freq: Two times a day (BID) | ORAL | Status: DC
  Filled 2024-02-02 (×3): qty 237

## 2024-02-02 NOTE — TOC Transition Note (Signed)
 Transition of Care [facility]) - Discharge Note   Patient Details  Name: Benjamin [name] MRN: [id number] Date of Birth: 1981-04-14  Transition [facility] CM/SW Contact:  Lauraine FORBES Saa, LCSW Phone Number: 02/02/2024, 4:41 PM   Clinical Narrative:     Patient will DC to: [facility] Anticipated DC date: 02/02/2024 Family notified: Estus Krakowski; Brother; [phone] Transport by: NORALYN   Per MD patient ready for DC to [facility]. RN to call report prior to discharge. RN, patient's family, and facility notified of DC (patient is not fully oriented). Police transport requested for patient for 20:00 pick up.  CSW will sign off for now as social work intervention is no longer needed. Please consult us  again if new needs arise.    Final next level of care: Psychiatric Hospital Barriers to Discharge: Barriers Resolved   Patient Goals and CMS Choice            Discharge Placement                Patient to be transferred to facility by: [facility] Name of family member notified: Augustine Leverette; Brother; [phone] Patient and family notified of of transfer: 02/02/24  Discharge Plan and Services Additional resources added to the After Visit Summary for   In-house Referral: Clinical Social Work   Post Acute Care Choice:  (IP Psych)                               Social Drivers of Health (SDOH) Interventions SDOH Screenings   Food Insecurity: Food Insecurity Present (01/28/2024)  Housing: Unknown (01/28/2024)  Transportation Needs: No Transportation Needs (01/28/2024)  Utilities: At Risk (01/28/2024)  Alcohol Screen: Low Risk  (06/11/2017)  Social Connections: Unknown (01/28/2024)  Tobacco Use: Medium Risk (01/28/2024)     Readmission Risk Interventions     No data to display

## 2024-02-02 NOTE — Consult Note (Addendum)
 [facility] Psychiatric Consult Initial  Patient Name: .Benjamin [name]  MRN: [id number]  DOB: 1980/10/01  Consult Order details:  Orders (From admission, onward)     Start     Ordered   01/28/24 1654  IP CONSULT TO PSYCHIATRY       Ordering Provider: Mannie Jerel PARAS, NP  Provider:  (Not yet assigned)  Question Answer Comment  Location [facility]   Reason for Consult? Psychosis, refusal to eat      01/28/24 1654             Mode of Visit: In person    Psychiatry Consult Evaluation  Service Date: February 02, 2024 LOS:  LOS: 0 days  Chief Complaint Psychosis  Primary Psychiatric Diagnoses  Psychosis, unspecified Rule out Catatonia  Assessment  Benjamin [name] is a 43 y.o. male admitted 01/28/2024  1:57 PM for poor PO intake. 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   His current presentation of poor PO intake in the setting of psychosis is most consistent with psychosis, unspecified v psychosis with catatonic feature s. He meets criteria for IVC based on danger to self.  Current outpatient psychotropic medications include Abilify  and fluvoxamine  and historically he has had a good response to these medications. He was noncompliant with medications prior to admission as evidenced by collateral report.   7/1: Medically stable per primary team with AKI improvement and pt is now engaging in some PO intake. Patient is being recommended for inpatient psychiatry. Patient is accepted at shift change tonight with specific time pending from [facility] this evening.   Concern for catatonia given mutism, postering, immobility/stupor with [place]Francis of 9. Ativan  challenge attempted but patient was got up and pointed at sign that  said NO MORE MEDS!! Suggesting he would get agitated if he received medication. Feel it would be more appropriate to do Ativan  challenge at [facility] for safety concerns. Furthermore this spontaneous movement suggested that patient may have a voluntary component to his catatonic features   Diagnoses:  Active Hospital problems: Principal Problem:   AKI (acute kidney injury) (HCC) Active Problems:   Delusional disorder (HCC)   PTSD (post-traumatic stress disorder)   Hypernatremia    Plan   ## Psychiatric Recommendations:   -- Defer Ativan  challenge to inpatient psych due to safety concerns -- Continue Zyprexa  10 mg at bedtime PO or IM if patient refuses p.o.  -- Continue Zyprexa  5 mg q6h PO or IM for agitation.  -- Appropriate for transfer to inpatient hospital when PO intake resumes. -- Psychiatry will continue to follow.    ## Medical Decision Making Capacity: Patient does not at this time have the capacity to refuse or accept medications d/t poor insight into his current mental state.  ## Further Work-up:  -- UDS prior to transfer to inpatient psych -- most recent EKG on 01/28/2024 had QtC of 424 -- Pertinent labwork reviewed earlier this admission includes: B12 1203, Ammonia  WNL, TSH WNL, TIBC 571, awaiting UDS   ## Disposition:-- under IVC. 7/1 medically stable for discharge and recommended for inpatient psychiatry pending bed availability at [facility].   ## Behavioral / Environmental: - No specific recommendations at this time.     ## Safety and Observation Level:  - Based on my clinical evaluation, I estimate the patient  to be at moderate risk of self harm in the current setting. - At this time, we recommend  1:1 Observation. This decision is based on my review of the chart including patient's history and current presentation, interview of the patient, mental status examination, and consideration of suicide risk including evaluating suicidal ideation, plan, intent, suicidal or  self-harm behaviors, risk factors, and protective factors. This judgment is based on our ability to directly address suicide risk, implement suicide prevention strategies, and develop a safety plan while the patient is in the clinical setting. Please contact our team if there is a concern that risk level has changed.  CSSR Risk Category:C-SSRS RISK CATEGORY: No Risk  Suicide Risk Assessment: Patient has following modifiable risk factors for suicide: recklessness, medication noncompliance, active mental illness (to encompass adhd, tbi, mania, psychosis, trauma reaction), and recent loss (death, isolation, vocation), which we are addressing by medication management. Patient has following non-modifiable or demographic risk factors for suicide: male gender, separation or divorce, and psychiatric hospitalization Patient has the following protective factors against suicide: Supportive family, Cultural, spiritual, or religious beliefs that discourage suicide, Minor children in the home, and no history of suicide attempts  Thank you for this consult request. Recommendations have been communicated to the primary team.  We will continue to follow at this time.   Benjamin Cornish, MD       History of Present Illness  Relevant Aspects of Hospital Course:  Admitted on 01/28/2024 for poor PO intake and psychosis. Family on admission said that patient had neither ate nor drank in two weeks on a mission from god. He is paranoid and does not explain further. Placed under IVC in ED. Did consume 4 cups of PO water but has since refused since being admitted. Concern for catatonia and defer Ativan  challenge to inpatient due to safety concerns on current unit.   Patient Report:  Patient is nonverbal today.   Psych ROS:  Depression: Denies currently Anxiety:  Denies Mania (lifetime and current): Denies previously. Very grandiose but not hyperverbal, no flight of ideas, not particularly irritable or distractable.   Psychosis: (lifetime and current): Denies, although looks psychotic on exam, staring forward, some speech latency. No RTIS. (Has an extensive past medical history of hypereligiosity, psychosis, and catatonia).   Collateral information:  Contacted Rivan Siordia, Brother at [phone] on 01/29/2024  Helped patient get admitted -- drove down from [place]. Patient is typically a very hardworking individual, great dad with two kids. Ten years ago, patient's wife was a bad person and patient had a psychotic break in 2016. At that time, he had just left his ex-wife and did not speak with anyone for a month. Had lost 70 lbs and not eaten for a month. Completely non-verbal. Hyper-religious: told brother to fast. Was brought up in the church, but this is different from baseline. 3 weeks ago, mother was admitted into hospice. May have been the reason for patient's current psychotic break. At some point over that time stopped taking his meds. Unsure of names, but on an antipsychotic and mood stabilizer. Had severe paranoia (people listening in). If I were to guess, he stopped taking his meds about 1 month ago. Was acting different on fathers day weekend, lots of religious talk. This scared brother -- would leave the kids with brother and go on long drives, all day long. Had been isolating. No firearms and no violence. Certainly not a dangerous person. Girlfriend drove down from [place] . Had to coax him into going into  the hospital for the purpose of checking on his vitals. Lost ~30 lbs since father's day, extremely significant. Very similar episode from previous. Believes he will be back to his normal self on his meds. Largest factor: mother moving from nursing home to hospice. He did confirm with brother that he stopped taking his medication. Normally, very productive. Out of the norm.   CONSENTS to forced medication protocol as a substitute decision maker as patient does not currently has medical capacity:  needs to be back on his meds.   Review of Systems  Reason unable to perform ROS: nonverbal.     Psychiatric and Social History  Psychiatric History:  Information collected from chart review, family member.   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 Remote history of Olanzapine .  Previous Med Trials: Trazodone  100 mg at bedtime, Haldol  (not helpful)  Therapy: Unknown  Prior Psych Hospitalization: 01/05/2017 -- 01/16/2017 for an extended phase of catatonia after self-discontinuing abilify  unresponsive to Ativan . Stopped talking, sleeping, eating and drinking. Started at wall, would not verbally communicate. Needed 5 treatments.  Prior Self Harm: Denies Prior Violence: Denies  Family Psych History: Depression in brother Family Hx suicide: Father  Social History:  Educational Hx: Unknown Occupational Hx: Worked as a Therapist, music Hx: Unknown Living Situation: With two teenage children, exwife does not have custody Spiritual Hx: Grew up in a religious christian family, but becomes hyperreligious Access to weapons/lethal means: Brother denies  Substance History Alcohol: Remote, per chart review Tobacco: Remote Illicit drugs: previous Kratom use.  Prescription drug abuse: Unknown Rehab hx: Unknown  Exam Findings  Physical Exam:  Vital Signs:  Temp:  [97.7 F (36.5 C)-98.6 F (37 C)] 97.7 F (36.5 C) (07/01 0756) Pulse Rate:  [51-64] 64 (07/01 0756) Resp:  [16-18] 16 (07/01 0431) BP: (108-129)/(78-96) 111/79 (07/01 0756) SpO2:  [99 %-100 %] 99 % (07/01 0756) Weight:  [77.8 kg] 77.8 kg (07/01  0431) Blood pressure 111/79, pulse 64, temperature 97.7 F (36.5 C), temperature source Oral, resp. rate 16, height 5' 10 (1.778 m), weight 77.8 kg, SpO2 99%. Body mass index is 24.61 kg/m.  Physical Exam Vitals and nursing note reviewed.  HENT:     Head: Normocephalic and atraumatic.  Pulmonary:     Effort: Pulmonary effort is normal.   Neurological:     General: No focal deficit present.     Mental Status: He is alert.   Psychiatric:     Comments: No obvious EPS.     Mental Status Exam: General Appearance:Laying in bed, shirt off, in scrubs.   Orientation:  UTA  Memory:  UTA  Concentration:  UTA  Recall:  UTA  Attention  UTA  Eye Contact:  UTA  Speech:  UTA  Language:  nonverbal  Volume:  UTA  Mood: UTA  Affect:  flat  Thought Process: UTA  Thought Content: UTA  Suicidal Thoughts: UTA  Homicidal Thoughts: UTA  Judgement:  UTA  Insight:  UTA  Psychomotor Activity:  UTA  Akathisia: none  Fund of Knowledge: UTA      Assets:  supportive family, housing  Cognition:  UTA  ADL's:  Not intact   AIMS (if  indicated):  no evidence of EPS     Other History   These have been pulled in through the EMR, reviewed, and updated if appropriate.  Family History:  The patient's family history includes Diabetes Mellitus II in his mother; Seizures in his mother; Stroke in his mother.  Medical History: Past Medical History:  Diagnosis Date   Depression    Hematemesis 08/2017   Herniated disc    x3   Hypertension    Mental disorder     Surgical History: Past Surgical History:  Procedure Laterality Date   ESOPHAGOGASTRODUODENOSCOPY Left 08/27/2017   Procedure: ESOPHAGOGASTRODUODENOSCOPY (EGD);  Surgeon: Rollin Dover, MD;  Location: [facility] ENDOSCOPY;  Service: Endoscopy;  Laterality: Left;   SHOULDER SURGERY     WISDOM TOOTH EXTRACTION       Medications:   Current Facility-Administered Medications:    acetaminophen  (TYLENOL ) tablet 650 mg, 650 mg, Oral, Q6H PRN  **OR** acetaminophen  (TYLENOL ) suppository 650 mg, 650 mg, Rectal, Q6H PRN, Amin, Ankit C, MD   bisacodyl  (DULCOLAX) EC tablet 5 mg, 5 mg, Oral, Daily PRN, Amin, Ankit C, MD   enoxaparin  (LOVENOX ) injection 40 mg, 40 mg, Subcutaneous, Q24H, Amin, Ankit C, MD   ferrous sulfate tablet 325 mg, 325 mg, Oral, Q breakfast, Arlon Honey W, DO, 325 mg at 01/31/24 9045   hydrALAZINE  (APRESOLINE ) injection 10 mg, 10 mg, Intravenous, Q4H PRN, Amin, Ankit C, MD   metoprolol  tartrate (LOPRESSOR ) injection 5 mg, 5 mg, Intravenous, Q4H PRN, Amin, Ankit C, MD   mupirocin ointment (BACTROBAN) 2 %, , Topical, BID, Arlon Honey ORN, DO, Given at 01/31/24 9044   nicotine  (NICODERM CQ  - dosed in mg/24 hours) patch 21 mg, 21 mg, Transdermal, Daily PRN, Amin, Ankit C, MD   OLANZapine  zydis (ZYPREXA ) disintegrating tablet 5 mg, 5 mg, Oral, Q6H PRN **OR** OLANZapine  (ZYPREXA ) injection 5 mg, 5 mg, Intramuscular, Q6H PRN, Cornelius Dines, MD   ondansetron  (ZOFRAN ) tablet 4 mg, 4 mg, Oral, Q6H PRN **OR** ondansetron  (ZOFRAN ) injection 4 mg, 4 mg, Intravenous, Q6H PRN, Amin, Ankit C, MD   oxyCODONE  (Oxy IR/ROXICODONE ) immediate release tablet 5 mg, 5 mg, Oral, Q4H PRN, Amin, Ankit C, MD   senna-docusate (Senokot-S) tablet 1 tablet, 1 tablet, Oral, QHS PRN, Amin, Ankit C, MD   sodium phosphate (FLEET) enema 1 enema, 1 enema, Rectal, Once PRN, Amin, Ankit C, MD  Allergies: No Known Allergies  Dines Cornelius, MD

## 2024-02-02 NOTE — Progress Notes (Signed)
 Progress Note   Patient: Benjamin [name] FMW:[id number] DOB: [birth year] DOA: 01/28/2024  DOS: the patient was seen and examined on 02/02/2024   Brief hospital course:  43 y.o. male with medical history significant of depression, psychosis, PTSD, OCD, marijuana use admitted to the hospital by the family due to mental health concerns.  According to the family and the patient he has not eaten anything in over 2 weeks and has lost some weight.  Patient keeps telling me that he is on admission from God not to eat and drink.  He does not have any other complaints.  Does not provide any other history.  Denies taking any medications at home.   Assessment and Plan:   Acute kidney injury - Likely prerenal etiology given patient's lack of p.o. hydration.  Reordering IV fluids now that patient is on forced medication protocol, however patient is still refusing IV.  Appears to now be accepting some p.o. liquids.  Creatinine mildly improved from yesterday.  AKI appears to be resolved at this time.  Hypernatremia - Secondary to above.  Noted dehydration with free water deficit.  Reordered fluids NS at 100 mL/h however patient was refusing IV.  Is showing improvement this morning subsequent to patient improving with p.o. intake.  Appears resolved.   Religious Delusions/Acute psychosis - Currently stating he needs on a mission from God to not eat or drink.  Psych consulted and following closely.  Continues on forced medication protocol.  Medications per psych.  IV fluids ordered. Continues to be IVC.  Goals of care - At this time patient is medically cleared, AKI is resolved, patient is excepting oral hydration.  Will defer to psychiatry for transfer to inpatient psych facility.   Subjective: Patient resting comfortably this morning.  He is selectively mute this morning, not responding to questions or following commands.  Physical Exam:  Vitals:   02/01/24 1603 02/01/24 2042 02/02/24 0431 02/02/24 0756   BP: 108/78 (!) 129/96 (!) 119/90 111/79  Pulse: (!) 57 (!) 51 62 64  Resp: 18 16 16    Temp: 98.4 F (36.9 C) 98.6 F (37 C) 97.8 F (36.6 C) 97.7 F (36.5 C)  TempSrc: Oral Oral Oral Oral  SpO2: 100% 100% 99% 99%  Weight:   77.8 kg   Height:        GENERAL:  Alert, pleasant, no acute distress  HEENT:  EOMI CARDIOVASCULAR:  RRR, no murmurs appreciated RESPIRATORY:  Clear to auscultation, no wheezing, rales, or rhonchi GASTROINTESTINAL:  Soft, nontender, nondistended EXTREMITIES:  No LE edema bilaterally NEURO:  No new focal deficits appreciated SKIN: Dry PSYCH: Calm, selectively mute   Data Reviewed:  No new imaging  Previous records (including but not limited to H&P, progress notes, nursing notes, TOC management) were reviewed in assessment of this patient.  Labs: CBC: Recent Labs  Lab 01/28/24 1416 01/28/24 1430 01/29/24 0559 01/30/24 0518 01/31/24 0644  WBC 12.3*  --  7.7 9.3 6.7  NEUTROABS 9.6*  --   --   --   --   HGB 14.3 17.0 14.2 13.6 12.9*  HCT 51.1 50.0 50.3 47.2 45.2  MCV 73.4*  --  72.8* 72.6* 73.3*  PLT 509*  --  383 392 278   Basic Metabolic Panel: Recent Labs  Lab 01/29/24 0559 01/30/24 0518 01/31/24 0644 02/01/24 0617 02/02/24 0505  NA 150* 149* 141 144 140  K 4.6 4.1 3.9 4.0 3.3*  CL 110 112* 106 108 105  CO2 26 26 27 26  28  GLUCOSE 101* 57* 103* 100* 78  BUN 53* 47* 36* 32* 28*  CREATININE 1.47* 1.32* 1.38* 1.27* 1.24  CALCIUM 10.0 9.6 8.9 9.3 8.9  MG  --  3.0* 2.4 2.6*  --   PHOS  --  5.5* 4.1 4.0  --    Liver Function Tests: Recent Labs  Lab 01/28/24 1416  AST 33  ALT 24  ALKPHOS 50  BILITOT 0.8  PROT 9.1*  ALBUMIN 5.0   CBG: Recent Labs  Lab 01/28/24 1606  GLUCAP 113*    Scheduled Meds:  enoxaparin  (LOVENOX ) injection  40 mg Subcutaneous Q24H   ferrous sulfate  325 mg Oral Q breakfast   LORazepam   2 mg Oral QHS   Or   LORazepam   2 mg Intramuscular QHS   mupirocin ointment   Topical BID   OLANZapine   10 mg  Oral QHS   Or   OLANZapine   10 mg Intramuscular QHS   Continuous Infusions: PRN Meds:.acetaminophen  **OR** acetaminophen , bisacodyl , hydrALAZINE , metoprolol  tartrate, nicotine , OLANZapine  **OR** OLANZapine , ondansetron  **OR** ondansetron  (ZOFRAN ) IV, oxyCODONE , senna-docusate, sodium phosphate  Family Communication: None at bedside  Disposition: Status is: Observation The patient remains OBS appropriate and will d/c before 2 midnights.     Time spent: 30 minutes  Length of inpatient stay: 0 days  Author: Carliss LELON Canales, DO 02/02/2024 10:46 AM  For on call review [email or web address].

## 2024-02-02 NOTE — Progress Notes (Addendum)
 Patient has been accepted to [facility] on 02/02/24.   Accepting is [name], MD. Attending is Oliva Salmon, MD.   Report can be called to [phone].   Patient can arrive after 2000. Remains under IVC. Sheriff transportation arranged by [employer] LCSW. IVC documentation must accompany the patient upon arrival.   For additional support facilitating transfer nursing can reach [facility] RN House Supervisor: [phone].

## 2024-02-02 NOTE — Tx Team (Signed)
 Initial Treatment Plan 02/02/2024 Benjamin [name] FMW:[id number]    PATIENT STRESSORS: Medication noncompliance  Mother being moved to hospice    PATIENT STRENGTHS: Supportive friends/family Religious affiliation   PATIENT IDENTIFIED PROBLEMS: Psychosis                     DISCHARGE CRITERIA:  Ability to meet basic life and health needs Adequate post-discharge living arrangements Improved stabilization in mood, thinking, and/or behavior Motivation to continue treatment in a less acute level of care Need for constant or close observation no longer present Reduction of life-threatening or endangering symptoms to within safe limits Verbal commitment to aftercare and medication compliance  PRELIMINARY DISCHARGE PLAN: Outpatient therapy Return to previous living arrangement  PATIENT/FAMILY INVOLVEMENT: This treatment plan has been presented to and reviewed with the patient, Benjamin [name]. The patient has been given the opportunity to ask questions and make suggestions.  Charolet Maxcy, RN 02/02/2024

## 2024-02-02 NOTE — TOC Progression Note (Addendum)
 Transition of Care [facility]) - Progression Note    Patient Details  Name: Benjamin [name] MRN: [id number] Date of Birth: [birth year]  [facility] CM/SW Contact  Lauraine FORBES Saa, LCSW Phone Number: 02/02/2024, 2:38 PM  Clinical Narrative:     2:38 PM Per IP Psychiatry team and [facility] admissions, patient is expected to discharge to [facility] tonight. Patient remains IVC and will need Sheriff to provide transportation.  Expected Discharge Plan: Psychiatric Hospital Barriers to Discharge: Barriers Resolved  Expected Discharge Plan and Services In-house Referral: Clinical Social Work   Post Acute Care Choice:  (IP Psych) Living arrangements for the past 2 months: Single Family Home                                       Social Determinants of Health (SDOH) Interventions SDOH Screenings   Food Insecurity: Food Insecurity Present (01/28/2024)  Housing: Unknown (01/28/2024)  Transportation Needs: No Transportation Needs (01/28/2024)  Utilities: At Risk (01/28/2024)  Alcohol Screen: Low Risk  (06/11/2017)  Social Connections: Unknown (01/28/2024)  Tobacco Use: Medium Risk (01/28/2024)    Readmission Risk Interventions     No data to display

## 2024-02-02 NOTE — Plan of Care (Signed)
  Problem: Clinical Measurements: Goal: Ability to maintain clinical measurements within normal limits will improve Outcome: Progressing Goal: Will remain free from infection Outcome: Progressing Goal: Diagnostic test results will improve Outcome: Progressing Goal: Respiratory complications will improve Outcome: Progressing Goal: Cardiovascular complication will be avoided Outcome: Progressing   Problem: Activity: Goal: Risk for activity intolerance will decrease Outcome: Progressing   Problem: Safety: Goal: Ability to remain free from injury will improve Outcome: Progressing   

## 2024-02-02 NOTE — Progress Notes (Signed)
 1806- Tried to give report to facility, front desk transferred me to nurses desk to give report. When called was picked up they stated that they did knew nothing about a patient of Benjamin [name] and transferred me back to the front desk. Called was not answered by the front desk, then call was disconnected. Will try to call back again before shift change

## 2024-02-02 NOTE — Progress Notes (Signed)
 1950- Called and gave report to [place], RN;n all questions were answered

## 2024-02-03 ENCOUNTER — Encounter (HOSPITAL_COMMUNITY): Payer: Self-pay

## 2024-02-03 DIAGNOSIS — F29 Unspecified psychosis not due to a substance or known physiological condition: Secondary | ICD-10-CM

## 2024-02-03 MED ORDER — OLANZAPINE 10 MG PO TBDP
10.0000 mg | ORAL_TABLET | Freq: Every day | ORAL | Status: DC
  Administered 2024-02-03 – 2024-02-06 (×4): 10 mg via ORAL
  Filled 2024-02-03 (×4): qty 1

## 2024-02-03 MED ORDER — LORAZEPAM 1 MG PO TABS
1.0000 mg | ORAL_TABLET | Freq: Two times a day (BID) | ORAL | Status: DC
  Filled 2024-02-03: qty 1

## 2024-02-03 MED ORDER — OLANZAPINE 10 MG PO TABS: 10.0000 mg | ORAL_TABLET | Freq: Every day | ORAL | Status: DC

## 2024-02-03 NOTE — Progress Notes (Addendum)
 Pt was observed walking in the hallway shirtless. Staff informed pt that it is not appropriate to be shirtless and needed to put a shirt on. Pt ignored staff, proceeded to go into dayroom, and sit shirtless in the dayroom. Staff continuously told pt to put a shirt on but pt refused to go put a shirt on. Instructed pt that he needed to return to his bedroom if he was going to be shirtless. Pt refused to leave the dayroom. Pt required a lot of encouragement. Pt was escorted by staff to his bedroom.

## 2024-02-03 NOTE — Progress Notes (Signed)
 Recreation Therapy Notes  LRT went to unit @1405  to complete recreation therapy assessment with patient. Patient was asleep and unable to complete assessment. LRT will attempt to complete assessment tomorrow.    Julian Askin-McCall, LRT,CTRS Leray Garverick A Jennye Runquist-McCall 02/03/2024 4:07 PM

## 2024-02-03 NOTE — Group Note (Signed)
 Recreation Therapy Group Note   Group Topic:Leisure Education  Group Date: 02/03/2024 Start Time: 1035 End Time: 1100 Facilitators: Sai Zinn-McCall, LRT,CTRS Location: 500 Hall Dayroom   Group Topic: Leisure Education  Goal Area(s) Addresses:  Patient will identify positive leisure and recreation activities.  Patient will identify one positive benefit of participation in leisure activities.   Behavioral Response:   Intervention: Group Game  Activity: Guess the Lyric. Patients were to spin the spinner and whatever category [employer], Pop, Hip Hop, R&B, Dance and Indie) the spinner landed on, the patient got a card from that deck. LRT read the lyric and the patient got 3 chances to guess the missing lyric. If they are unable to guess the lyric, the other players got the chance to guess the answer. If the spinner landed on the black triangle, the could steal a card from another player. The person with the most cards was the winner.  Education:  Teacher, English as a foreign language, Special educational needs teacher, Discharge Planning  Education Outcome: Acknowledges education/In group clarification offered/Needs additional education   Affect/Mood: N/A   Participation Level: Did not attend    Clinical Observations/Individualized Feedback:     Plan: Continue to engage patient in RT group sessions 2-3x/week.   Shelley Cocke-McCall, LRT,CTRS 02/03/2024 2:01 PM

## 2024-02-03 NOTE — Plan of Care (Signed)
  Problem: Activity: Goal: Sleeping patterns will improve Outcome: Progressing   Problem: Safety: Goal: Periods of time without injury will increase Outcome: Progressing   Problem: Education: Goal: Emotional status will improve Outcome: Not Progressing Goal: Mental status will improve Outcome: Not Progressing

## 2024-02-03 NOTE — Progress Notes (Signed)
   02/03/24 2000  Psych Admission Type (Psych Patients Only)  Admission Status Involuntary  Psychosocial Assessment  Patient Complaints None  Eye Contact Brief  Facial Expression Blank  Affect Preoccupied  Speech Aggressive;Elective mutism  Interaction Guarded  Motor Activity Slow  Appearance/Hygiene Disheveled  Behavior Characteristics Unwilling to participate  Mood Suspicious;Preoccupied  Aggressive Behavior  Effect No apparent injury  Thought Process  Coherency Blocking  Content Paranoia  Delusions WDL  Perception WDL  Hallucination None reported or observed  Judgment Poor  Confusion Mild  Danger to Self  Current suicidal ideation? Denies  Danger to Others  Danger to Others None reported or observed

## 2024-02-03 NOTE — BHH Suicide Risk Assessment (Signed)
 [facility] Admission Suicide Risk Assessment   Nursing information obtained from:  Review of record Demographic factors:  Male, Caucasian Current Mental Status:  NA Loss Factors:  NA Historical Factors:  Impulsivity Risk Reduction Factors:  Positive social support  Total Time spent with patient: 1 hour Principal Problem: Psychosis, unspecified psychosis type (HCC) Diagnosis:  Principal Problem:   Psychosis, unspecified psychosis type (HCC)   Subjective Data:  Benjamin [name] is a 43 y.o. male initially admitted at [facility] for poor PO intake and later to [facility] due to concern for decompensated psychosis (hyperreligious delusion). He carries the psychiatric diagnoses of unspecified psychosis formally on Abilify  LAI, PTSD, major depressive disorder, recurrent, with catatonic features requiring ECT in 2018, with psychotic features, cannabis use disorder, OCD (cleaning/straightening/organizing) and has a past medical history of GI bleed. Recent psychotropics have been Adderall and Vyvanse per dispense report prescribed by Dr. Tasia.    He has had recent stressor of mom being sent to hospice a few weeks ago.  Per collateral from brother obtained while at [facility], brother drove down from [place] to have patient admitted.  Patient's baseline is typically hard-working individual, greater with 2 kids.  Patient apparently had a psychotic break in 2016 to which she experienced approximately 70 pound weight loss after not eating for approximately a month, completely nonverbal, hyperreligious.  Patient's symptoms are very similar to this presentation as it was in 2016 after.  Patient's mother was admitted 3 weeks ago to hospice.  It is unclear at what point patient stopped taking his medications. He has apparently been very different since father's day weekend.   During assessment today, patient was completely nonverbal and minimally interactive with me.  He was apparently irritable at times and very  difficult to redirect according to nursing.  He showed me a piece of paper that showed that he desired grape juice or milk but would not respond otherwise.  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  World Science writer [facility]). Score between 0-7:  no or low risk or alcohol related problems. Score between 8-15:  moderate risk of alcohol related problems. Score between 16-19:  high risk of alcohol related problems. Score 20 or above:  warrants further diagnostic evaluation for alcohol dependence and treatment.   CLINICAL FACTORS:   More than one psychiatric diagnosis Currently Psychotic Previous Psychiatric Diagnoses and Treatments Medical Diagnoses and Treatments/Surgeries   Musculoskeletal: Strength & Muscle Tone: within normal limits Gait & Station: normal Patient leans: N/A  Psychiatric Specialty Exam: General Appearance:Laying in bed, shirt off, in scrubs.   Orientation:  UTA  Memory:  UTA  Concentration:  UTA  Recall:  UTA  Attention  UTA  Eye Contact:  UTA  Speech:  UTA  Language:  nonverbal  Volume:  UTA  Mood: UTA  Affect:  flat  Thought Process: UTA  Thought Content: UTA  Suicidal Thoughts: UTA  Homicidal Thoughts: UTA  Judgement:  UTA  Insight:  UTA  Psychomotor Activity:  UTA  Akathisia: none  Fund of Knowledge: UTA    Assets:  supportive family, housing  Cognition:  UTA  ADL's:  Not intact   AIMS (if indicated):  no evidence of EPS      Physical Exam: Physical Exam ROS Blood pressure 128/89, pulse 68, temperature 98.6 F (37  C), temperature source Oral, resp. rate 16, height 5' 10 (1.778 m), weight 77.3 kg, SpO2 100%. Body mass index is 24.45 kg/m.   COGNITIVE FEATURES THAT CONTRIBUTE TO RISK:  Loss of executive function    SUICIDE RISK:   Minimal:  No identifiable suicidal ideation.  Patients presenting with no risk factors but with morbid ruminations; may be classified as minimal risk based on the severity of the depressive symptoms  PLAN OF CARE: see H&P  I certify that inpatient services furnished can reasonably be expected to improve the patient's condition.   Prentice Espy, MD 02/03/2024, 3:31 PM

## 2024-02-03 NOTE — Progress Notes (Signed)
 Patient approached nurses station verbalizing that he felt better. I was out of it for a while. Patient educated on food and drink log and verbalized understanding. Patient requested a cup of water which was provided. Patient remains safe at this time.

## 2024-02-03 NOTE — BH IP Treatment Plan (Signed)
 Interdisciplinary Treatment and Diagnostic Plan Update  02/03/2024 Time of Session: 1120am Benjamin [name] MRN: [id number]  Principal Diagnosis: Psychosis, unspecified psychosis type [name])  Secondary Diagnoses: Principal Problem:   Psychosis, unspecified psychosis type (HCC)   Current Medications:  Current Facility-Administered Medications  Medication Dose Route Frequency Provider Last Rate Last Admin   acetaminophen  (TYLENOL ) tablet 650 mg  650 mg Oral Q6H PRN McCarty, Artie, MD       alum & mag hydroxide-simeth (MAALOX/MYLANTA) 200-200-20 MG/5ML suspension 30 mL  30 mL Oral Q4H PRN McCarty, Artie, MD       haloperidol  (HALDOL ) tablet 5 mg  5 mg Oral TID PRN McCarty, Artie, MD       And   diphenhydrAMINE  (BENADRYL ) capsule 50 mg  50 mg Oral TID PRN McCarty, Artie, MD       haloperidol  lactate (HALDOL ) injection 5 mg  5 mg Intramuscular TID PRN McCarty, Artie, MD   5 mg at 02/03/24 1115   And   diphenhydrAMINE  (BENADRYL ) injection 50 mg  50 mg Intramuscular TID PRN McCarty, Artie, MD       And   LORazepam  (ATIVAN ) injection 2 mg  2 mg Intramuscular TID PRN McCarty, Artie, MD       haloperidol  lactate (HALDOL ) injection 10 mg  10 mg Intramuscular TID PRN McCarty, Artie, MD       And   diphenhydrAMINE  (BENADRYL ) injection 50 mg  50 mg Intramuscular TID PRN McCarty, Artie, MD   50 mg at 02/03/24 1115   And   LORazepam  (ATIVAN ) injection 2 mg  2 mg Intramuscular TID PRN McCarty, Artie, MD   2 mg at 02/03/24 1114   LORazepam  (ATIVAN ) tablet 1 mg  1 mg Oral BID Ji, Andrew, MD       OLANZapine  (ZYPREXA ) tablet 10 mg  10 mg Oral QHS Ji, Andrew, MD       PTA Medications: No medications prior to admission.    Patient Stressors:    Patient Strengths:    Treatment Modalities: Medication Management, Group therapy, Case management,  1 to 1 session with clinician, Psychoeducation, Recreational therapy.   Physician Treatment Plan for Primary Diagnosis: Psychosis, unspecified psychosis type  (HCC) Long Term Goal(s):     Short Term Goals:    Medication Management: Evaluate patient's response, side effects, and tolerance of medication regimen.  Therapeutic Interventions: 1 to 1 sessions, Unit Group sessions and Medication administration.  Evaluation of Outcomes: Not Progressing  Physician Treatment Plan for Secondary Diagnosis: Principal Problem:   Psychosis, unspecified psychosis type (HCC)  Long Term Goal(s):     Short Term Goals:       Medication Management: Evaluate patient's response, side effects, and tolerance of medication regimen.  Therapeutic Interventions: 1 to 1 sessions, Unit Group sessions and Medication administration.  Evaluation of Outcomes: Not Progressing   RN Treatment Plan for Primary Diagnosis: Psychosis, unspecified psychosis type (HCC) Long Term Goal(s): Knowledge of disease and therapeutic regimen to maintain health will improve  Short Term Goals: Ability to remain free from injury will improve, Ability to verbalize frustration and anger appropriately will improve, Ability to demonstrate self-control, Ability to participate in decision making will improve, Ability to verbalize feelings will improve, Ability to disclose and discuss suicidal ideas, Ability to identify and develop effective coping behaviors will improve, and Compliance with prescribed medications will improve  Medication Management: RN will administer medications as ordered by provider, will assess and evaluate patient's response and provide education to patient for prescribed medication.  RN will report any adverse and/or side effects to prescribing provider.  Therapeutic Interventions: 1 on 1 counseling sessions, Psychoeducation, Medication administration, Evaluate responses to treatment, Monitor vital signs and CBGs as ordered, Perform/monitor CIWA, COWS, AIMS and Fall Risk screenings as ordered, Perform wound care treatments as ordered.  Evaluation of Outcomes: Not  Progressing   LCSW Treatment Plan for Primary Diagnosis: Psychosis, unspecified psychosis type (HCC) Long Term Goal(s): Safe transition to appropriate next level of care at discharge, Engage patient in therapeutic group addressing interpersonal concerns.  Short Term Goals: Engage patient in aftercare planning with referrals and resources, Increase social support, Increase ability to appropriately verbalize feelings, Increase emotional regulation, Facilitate acceptance of mental health diagnosis and concerns, Facilitate patient progression through stages of change regarding substance use diagnoses and concerns, Identify triggers associated with mental health/substance abuse issues, and Increase skills for wellness and recovery  Therapeutic Interventions: Assess for all discharge needs, 1 to 1 time with Social worker, Explore available resources and support systems, Assess for adequacy in community support network, Educate family and significant other(s) on suicide prevention, Complete Psychosocial Assessment, Interpersonal group therapy.  Evaluation of Outcomes: Not Progressing   Progress in Treatment: Attending groups: No. Participating in groups: No. Taking medication as prescribed: No. Toleration medication: No. Family/Significant other contact made: No, will contact:  consents pending Patient understands diagnosis: No. Discussing patient identified problems/goals with staff: No. Medical problems stabilized or resolved: Yes. Denies suicidal/homicidal ideation: Yes. Issues/concerns per patient self-inventory: No.   New problem(s) identified: Yes, Describe:  selectively mute  New Short Term/Long Term Goal(s): medication stabilization, elimination of SI thoughts, development of comprehensive mental wellness plan.    Patient Goals:  Pt refused to speak. Pt wrote on paper I am here to complete my mission. I mean no harm  Discharge Plan or Barriers: Patient recently admitted. CSW will  continue to follow and assess for appropriate referrals and possible discharge planning.    Reason for Continuation of Hospitalization: Delusions  Medication stabilization  Estimated Length of Stay: 5-7 days  Last 3 [place] Suicide Severity Risk Score: Flowsheet Row [facility] (Current[facility] (Discharged) from 01/28/2024 in [facility]SURG UNIT  C-SSRS RISK CATEGORY Error: Question 2 not populated No Risk    Last PHQ 2/9 Scores:     No data to display          Scribe for Treatment Team: Jenkins LULLA Primer, LCSWA 02/03/2024 11:49 AM

## 2024-02-03 NOTE — Progress Notes (Signed)
   02/03/24 1145  Psych Admission Type (Psych Patients Only)  Admission Status Involuntary  Psychosocial Assessment  Patient Complaints None  Eye Contact Avoids  Facial Expression Blank  Affect Blunted;Preoccupied  Speech Elective mutism  Interaction Poor;Guarded  Motor Activity Restless  Appearance/Hygiene In scrubs  Behavior Characteristics Unwilling to participate;Restless  Mood Suspicious;Preoccupied  Thought Process  Coherency Blocking  Content Preoccupation;Paranoia  Delusions UTA  Perception UTA  Hallucination UTA  Judgment Poor  Confusion Moderate  Danger to Self  Current suicidal ideation? Denies  Agreement Not to Harm Self Yes  Description of Agreement Verbal  Danger to Others  Danger to Others None reported or observed

## 2024-02-03 NOTE — Progress Notes (Addendum)
 Admission Note:   43 yr male who presents IVC for psychosis. Pt was brought in by his family due to concerns of pt's behavior. Pt has not been eating or drinking for the past two weeks and has lost significant weight. Pt has not been taking his psych medications. Pt has been having religious delusions of being on a mission from God to not eat or drink.  Per chart, 3 weeks ago, mother was admitted into hospice. May have been the reason for patient's current psychotic break. At some point over that time stopped taking his meds. Unsure of names, but on an antipsychotic and mood stabilizer. Had severe paranoia (people listening in). If I were to guess, he stopped taking his meds about 1 month ago. Was acting different on fathers day weekend, lots of religious talk.  Initially pt was refusing to leave the sally port and required a lot of encouragement to go onto the unit. Pt was mute and would not reply to assessment questions. Pt required frequent coaching to follow writer's instructions. Pt would follow some commands but not all commands. Pt would sometimes communicate by writing onto a piece of paper.  Skin was assessed and found to be clear of any abnormal marks apart from a bruise on his right bottom heel. PT searched and no contraband found, POC and unit policies explained. Pt did not verbalize an understanding and refused to sign consents. Food and fluids offered. Pt had no additional questions or concerns.

## 2024-02-03 NOTE — Group Note (Signed)
 Date:  02/03/2024 Time:  10:43 AM  Group Topic/Focus:  Goals Group:   The focus of this group is to help patients establish daily goals to achieve during treatment and discuss how the patient can incorporate goal setting into their daily lives to aide in recovery. Orientation:   The focus of this group is to educate the patient on the purpose and policies of crisis stabilization and provide a format to answer questions about their admission.  The group details unit policies and expectations of patients while admitted.    Participation Level:  Did Not Attend   Benjamin [name] [name] 02/03/2024, 10:43 AM

## 2024-02-03 NOTE — Plan of Care (Signed)
   Problem: Safety: Goal: Periods of time without injury will increase Outcome: Progressing

## 2024-02-03 NOTE — H&P (Addendum)
 Psychiatric Admission Assessment Adult  Patient Identification: Benjamin [name] MRN:  [id number] Date of Evaluation:  02/03/2024  Chief Complaint:  Psychosis, unspecified psychosis type (HCC) [F29],  Psychosis, unspecified psychosis type (HCC)  Principal Problem:   Psychosis, unspecified psychosis type (HCC)   History of Present Illness:  Benjamin [name] is a 43 y.o. male initially admitted at [facility] for poor PO intake and later to [facility] due to concern for decompensated psychosis (hyperreligious delusion). He carries the psychiatric diagnoses of unspecified psychosis formally on Abilify  LAI, PTSD, major depressive disorder, recurrent, with catatonic features requiring ECT in 2018, with psychotic features, cannabis use disorder, OCD (cleaning/straightening/organizing) and has a past medical history of GI bleed. Recent psychotropics have been Adderall and Vyvanse per dispense report prescribed by Dr. Tasia.   He has had recent stressor of mom being sent to hospice a few weeks ago.  Per collateral from brother obtained while at [facility], brother drove down from [place] to have patient admitted.  Patient's baseline is typically hard-working individual, greater with 2 kids.  Patient apparently had a psychotic break in 2016 to which she experienced approximately 70 pound weight loss after not eating for approximately a month, completely nonverbal, hyperreligious.  Patient's symptoms are very similar to this presentation as it was in 2016 after.  Patient's mother was admitted 3 weeks ago to hospice.  It is unclear at what point patient stopped taking his medications. He has apparently been very different since father's day weekend.  During assessment today, patient was completely nonverbal and minimally interactive with me.  He was apparently irritable at times and very difficult to redirect according to nursing.  He showed me a piece of paper that showed that he desired grape juice or milk but would  not respond otherwise.  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 Patient is typically a very hardworking individual, great dad with two kids. Ten years ago, patient's wife was a bad person and patient had a psychotic break in 2016. At that time, he had just left his ex-wife and did not speak with anyone for a month. Had lost 70 lbs and not eaten for a month. Completely non-verbal. Hyper-religious: told brother to fast. Was brought up in the church, but this is different from baseline. 3 weeks ago, mother was admitted into hospice. May have been the reason for patient's current psychotic break. At some point over that time stopped taking his meds. Unsure of names, but on an antipsychotic and mood stabilizer. Had severe paranoia (people listening in). If I were to guess, he stopped taking his meds about 1 month ago. Was acting different on fathers day weekend, lots of religious talk. This scared brother -- would leave the kids with brother and go on long drives, all day long. Had been isolating. No firearms and no violence. Certainly not a dangerous person. Girlfriend drove down from [place] . Had to coax him into going into the hospital for the purpose of checking on his vitals. Lost ~30 lbs since father's day, extremely significant. Very similar episode from previous. Believes he will be back to his normal self on his meds. Largest factor: mother moving from nursing home to hospice. He did confirm with brother that he stopped taking his medication. Normally, very productive. Out of the norm.  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 Remote history of Olanzapine .  Previous Med Trials: Trazodone  100 mg at bedtime, Haldol  (not helpful)  Therapy: Unknown   Prior Psych Hospitalization: 01/05/2017 -- 01/16/2017 for an extended phase of catatonia after self-discontinuing abilify  unresponsive to Ativan . Stopped talking, sleeping, eating and drinking. Started at wall, would not verbally communicate. Needed 5 treatments.  Prior Self Harm: Denies Prior Violence: Denies   Family Psych History: Depression in brother Family Hx suicide: Father   Social History:  Educational Hx: Unknown Occupational Hx: Worked as a Therapist, music Hx: Unknown Living Situation: With two teenage children, exwife does not have custody Spiritual Hx: Grew up in a religious christian family, but becomes hyperreligious Access to weapons/lethal means: Brother denies   Substance History Alcohol: Remote, per chart review Tobacco: Remote Illicit drugs: previous Kratom use.  Prescription drug abuse: Unknown Rehab hx: Unknown    Is the patient at risk to self? Yes Has the patient been a risk to self in the past 6 months? Yes Has the patient been a risk to self within the distant past? Yes Is the patient a risk to others? No Has the patient been a risk to others in the past 6 months? No Has the patient been a risk to others within the distant past? No  Alcohol Screening: Patient refused Alcohol Screening Tool: Yes Tobacco Screening:    Substance Abuse History in the last 12 months: Yes  Allergies:  No Known Allergies   Family History:  Family History  Problem Relation Age of Onset   Seizures Mother    Stroke Mother    Diabetes Mellitus II Mother      Lab Results:  Results for orders placed or  performed during the hospital encounter of 01/28/24 (from the past 48 hours)  Basic metabolic panel with GFR     Status: Abnormal   Collection Time: 02/02/24  5:05 AM  Result Value Ref Range   Sodium 140 135 - 145 mmol/L   Potassium 3.3 (L) 3.5 - 5.1 mmol/L   Chloride 105 98 - 111 mmol/L   CO2 28 22 - 32 mmol/L   Glucose, Bld 78 70 - 99 mg/dL    Comment: Glucose reference range applies only to samples taken after fasting for at least 8 hours.   BUN 28 (H) 6 - 20 mg/dL   Creatinine, Ser 8.75 0.61 - 1.24 mg/dL   Calcium 8.9 8.9 - 89.6 mg/dL   GFR, Estimated >39 >39 mL/min    Comment: (NOTE) Calculated using the CKD-EPI Creatinine Equation (2021)    Anion gap 7 5 - 15    Comment: Performed at [facility], [address] [place], [place] [address]    Blood Alcohol level:  Lab Results  Component Value Date   [facility] <15 01/28/2024   ETH <10 10/27/2019    Metabolic Disorder Labs:  Lab Results  Component Value Date   HGBA1C 5.2 07/15/2016   MPG 103 07/15/2016   No results found for: PROLACTIN Lab Results  Component Value Date   CHOL 187 07/15/2016   TRIG 86 07/15/2016   HDL 30 (L)  07/15/2016   CHOLHDL 6.2 07/15/2016   VLDL 17 07/15/2016   LDLCALC 140 (H) 07/15/2016    Current Medications: Current Facility-Administered Medications  Medication Dose Route Frequency Provider Last Rate Last Admin   acetaminophen  (TYLENOL ) tablet 650 mg  650 mg Oral Q6H PRN McCarty, Artie, MD       alum & mag hydroxide-simeth (MAALOX/MYLANTA) 200-200-20 MG/5ML suspension 30 mL  30 mL Oral Q4H PRN McCarty, Artie, MD       haloperidol  (HALDOL ) tablet 5 mg  5 mg Oral TID PRN McCarty, Artie, MD       And   diphenhydrAMINE  (BENADRYL ) capsule 50 mg  50 mg Oral TID PRN McCarty, Artie, MD       haloperidol  lactate (HALDOL ) injection 5 mg  5 mg Intramuscular TID PRN McCarty, Artie, MD       And   diphenhydrAMINE  (BENADRYL ) injection 50 mg  50 mg Intramuscular TID PRN McCarty, Artie, MD        And   LORazepam  (ATIVAN ) injection 2 mg  2 mg Intramuscular TID PRN McCarty, Artie, MD       haloperidol  lactate (HALDOL ) injection 10 mg  10 mg Intramuscular TID PRN McCarty, Artie, MD       And   diphenhydrAMINE  (BENADRYL ) injection 50 mg  50 mg Intramuscular TID PRN McCarty, Artie, MD       And   LORazepam  (ATIVAN ) injection 2 mg  2 mg Intramuscular TID PRN McCarty, Artie, MD       feeding supplement (ENSURE PLUS HIGH PROTEIN) liquid 237 mL  237 mL Oral BID BM Bouchard, Marc A, DO       LORazepam  (ATIVAN ) tablet 1 mg  1 mg Oral BID Lynnette Barter, MD        PTA Medications: No medications prior to admission.    Physical Findings: AIMS: No  CIWA:    COWS:     Psychiatric Specialty Exam: General Appearance:Laying in bed, shirt off, in scrubs.   Orientation:  UTA  Memory:  UTA  Concentration:  UTA  Recall:  UTA  Attention  UTA  Eye Contact:  UTA  Speech:  UTA  Language:  nonverbal  Volume:  UTA  Mood: UTA  Affect:  flat  Thought Process: UTA  Thought Content: UTA  Suicidal Thoughts: UTA  Homicidal Thoughts: UTA  Judgement:  UTA  Insight:  UTA  Psychomotor Activity:  UTA  Akathisia: none  Fund of Knowledge: UTA   Assets:  supportive family, housing  Cognition:  UTA  ADL's:  Not intact   AIMS (if indicated):  no evidence of EPS   Review of Systems Review of Systems  Respiratory:  Negative for shortness of breath.   Cardiovascular:  Negative for chest pain.  Gastrointestinal:  Negative for abdominal pain, constipation, diarrhea, heartburn, nausea and vomiting.  Neurological:  Negative for headaches.    Vital signs: Blood pressure 128/89, pulse 68, temperature 98.6 F (37 C), temperature source Oral, resp. rate 16, height 5' 10 (1.778 m), weight 77.3 kg, SpO2 100%. Body mass index is 24.45 kg/m. Physical Exam Constitutional:      Appearance: Normal appearance.  HENT:     Head: Normocephalic and atraumatic.  Eyes:     Extraocular Movements: Extraocular  movements intact.     Conjunctiva/sclera: Conjunctivae normal.     Pupils: Pupils are equal, round, and reactive to light.  Cardiovascular:     Rate and Rhythm: Normal rate.     Pulses: Normal pulses.  Pulmonary:     Effort: Pulmonary effort is normal.  Musculoskeletal:        General: Normal range of motion.     Cervical back: Normal range of motion.  Skin:    General: Skin is warm and dry.  Neurological:     General: No focal deficit present.     Mental Status: He is alert and oriented to person, place, and time. Mental status is at baseline.      Treatment Plan Summary: Daily contact with patient to assess and evaluate symptoms and progress in treatment and medication management  ASSESSMENT: Patient nonverbal.  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  This is likely a component of his hyperreligious delusions as consistent with last psychosocial stressor which led to his hospitalization almost 1 decade ago.  Patient has been started on olanzapine  in an effort to aid with his ongoing psychosis.  Should patient be refusing this medication, we will likely initiate NEFM tomorrow.  Pudding care orders to monitor daily food and liquid intake.  Will be repeating labs tomorrow to assess organ function.  Should patient start to refuse p.o. intake, he will likely need ECT due to concerns of treatment resistant depression or psychosis.   PLAN: Safety and Monitoring:  -- Involuntary admission to inpatient psychiatric unit for safety, stabilization and treatment  -- Daily contact with patient to assess and evaluate symptoms and progress in treatment  -- Patient's case to be discussed in multi-disciplinary team meeting  -- Observation Level : q15 minute checks  -- Vital signs: q12 hours  -- Precautions: suicide, elopement, and assault  2. Psychiatric  Problems Psychosis Delusional disorder R/O catatonia MDD OCD PTSD - Start olanzapine  Zydis 10 mg nightly - CMP tomorrow a.m. - Food and fluid log - Daily weights - Consider mirtazapine if low p.o. intake as well as for depression - May benefit from returning to Abilify  LAI given prior success with this -Reached out to outpatient provider to discuss patient's baseline and reason for being on Vyvanse/Adderall.  PRN medications for symptomatic management: -- As needed agitation protocol in-place  The risks/benefits/side-effects/alternatives to the above medication were discussed in detail with the patient and time was given for questions. The patient consents to medication trial. FDA black box warnings, if present, were discussed.  The patient is agreeable with the medication plan, as above. We will monitor the patient's response to pharmacologic treatment, and adjust medications as necessary.  3. Medical Problems Hypokalemia - Repeat CMP  4. Routine and other pertinent labs: EKG monitoring: QTc: 424  Metabolism / endocrine: BMI: Body mass index is 24.45 kg/m. Prolactin: No results found for: PROLACTIN Lipid Panel: Lab Results  Component Value Date   CHOL 187 07/15/2016   TRIG 86 07/15/2016   HDL 30 (L) 07/15/2016   CHOLHDL 6.2 07/15/2016   VLDL 17 07/15/2016   LDLCALC 140 (H) 07/15/2016   HbgA1c: Hgb A1c MFr Bld (%)  Date Value  07/15/2016 5.2   TSH: TSH (uIU/mL)  Date Value  01/28/2024 1.997  09/08/2012 2.908    5. Group Therapy:  -- Encouraged patient to participate in unit milieu and in scheduled group therapies   -- Short Term Goals: Ability to identify changes in lifestyle to reduce recurrence of condition, verbalize feelings, identify and develop effective coping behaviors, maintain clinical measurements within normal limits, and identify triggers associated with substance  abuse/mental health issues will improve. Improvement in ability to demonstrate  self-control and comply with prescribed medications.  -- Long Term Goals: Improvement in symptoms so as ready for discharge -- Patient is encouraged to participate in group therapy while admitted to the psychiatric unit. -- We will address other chronic and acute stressors, which contributed to the patient's Psychosis, unspecified psychosis type (HCC) in order to reduce the risk of self-harm at discharge.  6. Discharge Planning:   -- Social work and case management to assist with discharge planning and identification of hospital follow-up needs prior to discharge  -- Estimated LOS: 7-14 days  -- Discharge Concerns: Need to establish a safety plan; Medication compliance and effectiveness  -- Discharge Goals: Return home with outpatient referrals for mental health follow-up including medication management/psychotherapy  I certify that inpatient services furnished can reasonably be expected to improve the patient's condition.  Signed: Prentice Espy, MD 02/03/2024, 8:44 AM

## 2024-02-03 NOTE — Progress Notes (Signed)
 Patient refused scheduled ativan . Patient restless, pacing halls, refusing to cooperate with verbal prompts, selectively mute, and showing increasing agitation. IM agitation protocol administered, per MAR. Safety checks continue. Patient remains safe at this time.

## 2024-02-03 NOTE — Plan of Care (Signed)
   Problem: Education: Goal: Emotional status will improve Outcome: Not Progressing Goal: Mental status will improve Outcome: Not Progressing Goal: Verbalization of understanding the information provided will improve Outcome: Not Progressing

## 2024-02-04 LAB — COMPREHENSIVE METABOLIC PANEL WITH GFR
ALT: 48 U/L — ABNORMAL HIGH (ref 0–44)
AST: 40 U/L (ref 15–41)
Albumin: 4 g/dL (ref 3.5–5.0)
Alkaline Phosphatase: 43 U/L (ref 38–126)
Anion gap: 8 (ref 5–15)
BUN: 23 mg/dL — ABNORMAL HIGH (ref 6–20)
CO2: 25 mmol/L (ref 22–32)
Calcium: 9.1 mg/dL (ref 8.9–10.3)
Chloride: 103 mmol/L (ref 98–111)
Creatinine, Ser: 1.11 mg/dL (ref 0.61–1.24)
GFR, Estimated: >60 mL/min (ref >60)
Glucose, Bld: 98 mg/dL (ref 70–99)
Potassium: 4 mmol/L (ref 3.5–5.1)
Sodium: 136 mmol/L (ref 135–145)
Total Bilirubin: 0.5 mg/dL (ref 0.0–1.2)
Total Protein: 7.4 g/dL (ref 6.5–8.1)

## 2024-02-04 LAB — LIPID PANEL
Cholesterol: 153 mg/dL (ref 0–200)
HDL: 29 mg/dL — ABNORMAL LOW (ref >40)
LDL Cholesterol: 93 mg/dL (ref 0–99)
Total CHOL/HDL Ratio: 5.3 ratio
Triglycerides: 155 mg/dL — ABNORMAL HIGH (ref <150)
VLDL: 31 mg/dL (ref 0–40)

## 2024-02-04 MED ORDER — LORAZEPAM 2 MG/ML IJ SOLN: 2.0000 mg | Freq: Once | INTRAMUSCULAR | Status: DC

## 2024-02-04 MED ORDER — LORAZEPAM 2 MG/ML IJ SOLN
2.0000 mg | Freq: Once | INTRAMUSCULAR | Status: AC
  Administered 2024-02-04: 2 mg via INTRAMUSCULAR

## 2024-02-04 MED ORDER — LORAZEPAM 1 MG PO TABS
1.0000 mg | ORAL_TABLET | Freq: Three times a day (TID) | ORAL | Status: DC
  Administered 2024-02-04 (×2): 1 mg via ORAL
  Filled 2024-02-04 (×2): qty 1

## 2024-02-04 MED ORDER — LORAZEPAM 2 MG/ML IJ SOLN
2.0000 mg | Freq: Once | INTRAMUSCULAR | Status: AC
  Administered 2024-02-04: 2 mg via INTRAMUSCULAR
  Filled 2024-02-04: qty 1

## 2024-02-04 NOTE — BHH Counselor (Signed)
 Adult Comprehensive Assessment  Patient ID: Benjamin [name], male   DOB: Nov 15, 1980, 43 y.o.   MRN: [id number]   Second attempt:  3:00 PM - CSW attempted to complete an assessment but patient said, not today.  Patient observed that patient was eating crackers and peanut butter while sitting on his bed.   Benjamin [name], LCSWA 02/04/2024

## 2024-02-04 NOTE — Plan of Care (Addendum)
 Pt presents with blunted affect, avertive eye contact (looking down at the floor), irritable mood and elective mutism. Pt denies SI, HI, AVH and pain with head node on initial interactions. Ativan  challenge initiated for catatonia (Ativan  2 mg IM X 1) given with minimal progress. Ativan  1 mg PO given at 1321 for catatonic symptoms. Pt went to cafeteria with peers but refused to eat I only drank milk. Can I please get graham crackers and peanut butter.  Pt ate dinner, went twice to the line and drank milk. Observed to be more engaged and interactive with fair eye contact and brighter affect this evening. Although he remains cautious, guarded but he's been cooperative with care this evening. Safety checks maintained at Q 15 minutes intervals without outburst. Emotional support, encouragement and reassurance offered.   Problem: Coping: Goal: Ability to demonstrate self-control will improve Outcome: Progressing   Problem: Safety: Goal: Periods of time without injury will increase Outcome: Progressing   Problem: Self-Concept: Goal: Will verbalize positive feelings about self Outcome: Progressing

## 2024-02-04 NOTE — B[facility] Group Notes (Signed)
 Adult Psychoeducational Group Note  Date:  02/04/2024 Time:  11:30 AM  Group Topic/Focus:  Goals Group:   The focus of this group is to help patients establish daily goals to achieve during treatment and discuss how the patient can incorporate goal setting into their daily lives to aide in recovery. Orientation:   The focus of this group is to educate the patient on the purpose and policies of crisis stabilization and provide a format to answer questions about their admission.  The group details unit policies and expectations of patients while admitted.  Participation Level:  Did Not Attend  Participation Quality:    Affect:    Cognitive:    Insight:   Engagement in Group:    Modes of Intervention:    Additional Comments:    Xaviar Lunn O 02/04/2024, 11:30 AM

## 2024-02-04 NOTE — Progress Notes (Signed)
 [facility] MD Progress Note  02/04/2024 11:50 AM Benjamin [name]  MRN:  [id number]  Principal Problem: Psychosis, unspecified psychosis type (HCC) Diagnosis: Principal Problem:   Psychosis, unspecified psychosis type (HCC)   Reason for Admission:  Benjamin [name] is a 43 y.o. male initially admitted at [facility] for poor PO intake and later to [facility] due to concern for decompensated psychosis (hyperreligious delusion). He carries the psychiatric diagnoses of unspecified psychosis formally on Abilify  LAI, PTSD, major depressive disorder, recurrent, with catatonic features requiring ECT in 2018, with psychotic features, cannabis use disorder, OCD (cleaning/straightening/organizing) and has a past medical history of GI bleed.  (admitted on 02/02/2024, total  LOS: 2 days )   ASSESSMENT: Patient's remains mute this morning. Concern for catatonia. He was agitated yesterday which may have been signs of hyperexcitable catatonia rather than agitation/psychosis. He received agitation PRN which seemed to have significantly improved his symptoms. Performing ativan  challenge today. Will start ativan  PO if challenge becomes positive.  PLAN: #Psychosis #Delusional Disorder #R/O catatonia #MDD #OCD #PTSD -continue zyprexa  zydis 10 mg at bedtime  -cmp overall improved  -lipid profile wnl  -A1c in process -ativan  2 mg IM once  -Pre-ativan  challenge BFRS 13 - Food and fluid log - Daily weights (last ~77 kg) - Consider mirtazapine if low p.o. intake as well as for depression - May benefit from returning to Abilify  LAI given prior success with this -Reached out to outpatient provider to discuss patient's baseline and reason for being on Vyvanse/Adderall.  Disposition Planning: -- Estimated LOS: 7-10 days --Estimated Discharge Date: 7/10 --Barriers to Discharge: catatonia, altered mental status, psychosis -- Discharge Goals: Return home with outpatient referrals for mental health follow-up including medication  management/psychotherapy  Subjective: The patient was seen and evaluated on the unit. On assessment today the patient remains mute and minimally responsive. Laying in bed and does not make eye contact. Does not verbalize anything.   Objective: Chart Review from last 24 hours:  The patient's chart was reviewed and nursing notes were reviewed. The patient's case was discussed in multidisciplinary team meeting.  - Overnight events to report per chart review / staff report: none - Patient took all prescribed medications: refused AM ativan  but this was discontinued as it was suspected not to be catatonia - Patient received the following PRNs: haloperidol  5, benadryl  50, ativan  2  Bush Francis Rating Scale Pre ativan  challenge: 13 Immobility 2 Mutism 3 Staring 3 Negativism 2 Withdrawal 2 Excitement 1    Current Medications: Current Facility-Administered Medications  Medication Dose Route Frequency Provider Last Rate Last Admin   acetaminophen  (TYLENOL ) tablet 650 mg  650 mg Oral Q6H PRN McCarty, Artie, MD       alum & mag hydroxide-simeth (MAALOX/MYLANTA) 200-200-20 MG/5ML suspension 30 mL  30 mL Oral Q4H PRN McCarty, Artie, MD       haloperidol  (HALDOL ) tablet 5 mg  5 mg Oral TID PRN McCarty, Artie, MD       And   diphenhydrAMINE  (BENADRYL ) capsule 50 mg  50 mg Oral TID PRN McCarty, Artie, MD       haloperidol  lactate (HALDOL ) injection 5 mg  5 mg Intramuscular TID PRN McCarty, Artie, MD   5 mg at 02/03/24 1115   And   diphenhydrAMINE  (BENADRYL ) injection 50 mg  50 mg Intramuscular TID PRN McCarty, Artie, MD       And   LORazepam  (ATIVAN ) injection 2 mg  2 mg Intramuscular TID PRN McCarty, Artie, MD  haloperidol  lactate (HALDOL ) injection 10 mg  10 mg Intramuscular TID PRN McCarty, Artie, MD       And   diphenhydrAMINE  (BENADRYL ) injection 50 mg  50 mg Intramuscular TID PRN McCarty, Artie, MD   50 mg at 02/03/24 1115   And   LORazepam  (ATIVAN ) injection 2 mg  2 mg  Intramuscular TID PRN McCarty, Artie, MD   2 mg at 02/03/24 1114   OLANZapine  zydis (ZYPREXA ) disintegrating tablet 10 mg  10 mg Oral QHS Lynnette Barter, MD   10 mg at 02/03/24 2047    Lab Results:  Results for orders placed or performed during the hospital encounter of 02/02/24 (from the past 48 hours)  Comprehensive metabolic panel     Status: Abnormal   Collection Time: 02/04/24  6:37 AM  Result Value Ref Range   Sodium 136 135 - 145 mmol/L   Potassium 4.0 3.5 - 5.1 mmol/L   Chloride 103 98 - 111 mmol/L   CO2 25 22 - 32 mmol/L   Glucose, Bld 98 70 - 99 mg/dL    Comment: Glucose reference range applies only to samples taken after fasting for at least 8 hours.   BUN 23 (H) 6 - 20 mg/dL   Creatinine, Ser 8.88 0.61 - 1.24 mg/dL   Calcium 9.1 8.9 - 89.6 mg/dL   Total Protein 7.4 6.5 - 8.1 g/dL   Albumin 4.0 3.5 - 5.0 g/dL   AST 40 15 - 41 U/L   ALT 48 (H) 0 - 44 U/L   Alkaline Phosphatase 43 38 - 126 U/L   Total Bilirubin 0.5 0.0 - 1.2 mg/dL   GFR, Estimated >39 >39 mL/min    Comment: (NOTE) Calculated using the CKD-EPI Creatinine Equation (2021)    Anion gap 8 5 - 15    Comment: Performed at [facility], [address], [place], [place] [address]  Lipid panel     Status: Abnormal   Collection Time: 02/04/24  6:37 AM  Result Value Ref Range   Cholesterol 153 0 - 200 mg/dL   Triglycerides 844 (H) <150 mg/dL   HDL 29 (L) >59 mg/dL   Total CHOL/HDL Ratio 5.3 RATIO   VLDL 31 0 - 40 mg/dL   LDL Cholesterol 93 0 - 99 mg/dL    Comment:        Total Cholesterol/HDL:CHD Risk Coronary Heart Disease Risk Table                     Men   Women  1/2 Average Risk   3.4   3.3  Average Risk       5.0   4.4  2 X Average Risk   9.6   7.1  3 X Average Risk  23.4   11.0        Use the calculated Patient Ratio above and the CHD Risk Table to determine the patient's CHD Risk.        ATP III CLASSIFICATION (LDL):  <100     mg/dL   Optimal  899-870  mg/dL   Near or Above                     Optimal  130-159  mg/dL   Borderline  839-810  mg/dL   High  >809     mg/dL   Very High Performed at [facility], [address] [place], [place] [address]     Blood Alcohol level:  Lab Results  Component Value Date   [facility] <15 01/28/2024   ETH <10 10/27/2019    Metabolic Labs: Lab Results  Component Value Date   HGBA1C 5.2 07/15/2016   MPG 103 07/15/2016   No results found for: PROLACTIN Lab Results  Component Value Date   CHOL 153 02/04/2024   TRIG 155 (H) 02/04/2024   HDL 29 (L) 02/04/2024   CHOLHDL 5.3 02/04/2024   VLDL 31 02/04/2024   LDLCALC 93 02/04/2024   LDLCALC 140 (H) 07/15/2016    Physical Findings: AIMS: No  CIWA:    COWS:     Psychiatric Specialty Exam: General Appearance: disheveled  Eye Contact: none  Speech: unable to determine  Volume: unable to determine  Mood: unable to determine  Affect: flat  Thought Content: unable to determine  Suicidal Thoughts: unable to determine  Homicidal Thoughts: unable to determine  Thought Process: unable to determine  Orientation: unable to determine    Memory: unable to determine  Judgment: unable to determine  Insight: unable to determine  Concentration: unable to determine  Recall: unable to determine  Fund of Knowledge: unable to determine  Language: unable to determine  Psychomotor Activity: psychomotor retardation  Assets: unable to determine     Review of Systems ROS  Vital Signs: Blood pressure 114/82, pulse 80, temperature 98.6 F (37 C), temperature source Oral, resp. rate 16, height 5' 10 (1.778 m), weight 77.3 kg, SpO2 100%. Body mass index is 24.45 kg/m. Physical Exam  I certify that inpatient services furnished can reasonably be expected to improve the patient's condition.   Signed: Prentice Espy, MD 02/04/2024, 11:50 AM

## 2024-02-04 NOTE — Group Note (Signed)
 Recreation Therapy Group Note   Group Topic:Health and Wellness  Group Date: 02/04/2024 Start Time: 9047 End Time: 1020 Facilitators: Austyn Perriello-McCall, LRT,CTRS Location: [address] Dayroom   Group Topic: Wellness  Goal Area(s) Addresses:  Patient will define components of whole wellness. Patient will verbalize benefit of whole wellness.  Behavioral Response:   Intervention: Music  Activity: LRT and patients discussed the importance of physical health. LRT then led group in a series of stretches to loosen up their muscles to get them ready for the rest of group. Patients then took turns leading the group in the exercises of their choosing. Group concluded with patients walking 10 laps up and down the hallway. Patients could take breaks if needed.  Education: Wellness, Building control surveyor.   Education Outcome: Acknowledges education/In group clarification offered/Needs additional education.    Affect/Mood: N/A   Participation Level: Did not attend    Clinical Observations/Individualized Feedback:     Plan: Continue to engage patient in RT group sessions 2-3x/week.   Leotha Voeltz-McCall, LRT,CTRS 02/04/2024 12:57 PM

## 2024-02-04 NOTE — Progress Notes (Signed)
 Pt did communicate verbally very sparingly , but would be selectively mute at times.

## 2024-02-04 NOTE — Group Note (Unsigned)
 Date:  02/04/2024 Time:  9:24 PM  Group Topic/Focus:  Wrap-Up Group:   The focus of this group is to help patients review their daily goal of treatment and discuss progress on daily workbooks.     Participation Level:  {BHH PARTICIPATION OZCZO:77735}  Participation Quality:  {BHH PARTICIPATION QUALITY:22265}  Affect:  {BHH AFFECT:22266}  Cognitive:  {BHH COGNITIVE:22267}  Insight: {BHH Insight2:20797}  Engagement in Group:  {BHH ENGAGEMENT IN HMNLE:77731}  Modes of Intervention:  {BHH MODES OF INTERVENTION:22269}  Additional Comments:  ***  Benjamin [name] 02/04/2024, 9:24 PM

## 2024-02-04 NOTE — BHH Counselor (Signed)
 Adult Comprehensive Assessment  Patient ID: Benjamin [name], male   DOB: November 26, 1980, 43 y.o.   MRN: [id number]  First attempt:  2:35 PM - CSW attempted to complete the assessment but patient was asleep.    4:00 PM - CSW attempted to complete the assessment but patient was asleep.   Benjamin [name], LCSWA 02/03/2024

## 2024-02-05 LAB — HEMOGLOBIN A1C
Hgb A1c MFr Bld: 5.4 % (ref 4.8–5.6)
Mean Plasma Glucose: 108 mg/dL

## 2024-02-05 MED ORDER — OLANZAPINE 5 MG PO TBDP
5.0000 mg | ORAL_TABLET | Freq: Every day | ORAL | Status: DC
  Administered 2024-02-05 – 2024-02-08 (×4): 5 mg via ORAL
  Filled 2024-02-05 (×4): qty 1

## 2024-02-05 MED ORDER — ENSURE PLUS HIGH PROTEIN PO LIQD
237.0000 mL | Freq: Two times a day (BID) | ORAL | Status: DC
  Administered 2024-02-05 – 2024-02-10 (×10): 237 mL via ORAL
  Filled 2024-02-05 (×13): qty 237

## 2024-02-05 MED ORDER — LORAZEPAM 1 MG PO TABS
1.0000 mg | ORAL_TABLET | Freq: Three times a day (TID) | ORAL | Status: DC
  Administered 2024-02-05 – 2024-02-07 (×7): 1 mg via ORAL
  Filled 2024-02-05 (×7): qty 1

## 2024-02-05 MED ORDER — HYDROXYZINE HCL 25 MG PO TABS: 25.0000 mg | ORAL_TABLET | Freq: Three times a day (TID) | ORAL | Status: DC | PRN

## 2024-02-05 NOTE — Plan of Care (Signed)
  Problem: Education: Goal: Emotional status will improve Outcome: Progressing Goal: Mental status will improve Outcome: Progressing   Problem: Activity: Goal: Sleeping patterns will improve Outcome: Progressing   Problem: Coping: Goal: Ability to demonstrate self-control will improve Outcome: Progressing   Problem: Health Behavior/Discharge Planning: Goal: Compliance with treatment plan for underlying cause of condition will improve Outcome: Progressing   Problem: Safety: Goal: Periods of time without injury will increase Outcome: Progressing

## 2024-02-05 NOTE — Group Note (Signed)
 Date:  02/05/2024 Time:  9:35 AM  Group Topic/Focus:  Goals Group:   The focus of this group is to help patients establish daily goals to achieve during treatment and discuss how the patient can incorporate goal setting into their daily lives to aide in recovery.    Participation Level:  Did Not Attend  Participation Quality:  Did Not Attend  Affect:  Did Not Attend  Cognitive:  Did Not Attend  Insight: None  Engagement in Group:  Did Not Attend  Modes of Intervention:  Did Not Attend  Additional Comments:  Did Not Attend  Joson Sapp E Eladio Dentremont 02/05/2024, 9:35 AM

## 2024-02-05 NOTE — BHH Group Notes (Addendum)
 Spirituality group facilitated by Kathleen Argue, BCC.  Group Description: Group focused on topic of hope. Patients participated in facilitated discussion around topic, connecting with one another around experiences and definitions for hope. Group members engaged with visual explorer photos, reflecting on what hope looks like for them today. Group engaged in discussion around how their definitions of hope are present today in hospital.  Modalities: Psycho-social ed, Adlerian, Narrative, MI  Patient Progress: Did not attend.

## 2024-02-05 NOTE — Progress Notes (Signed)
 Pt communicated more verbally and made his needs known. Pt was provided with snacks and fluids. Pt was receptive to staff instructions. Pt received scheduled medications.     02/04/24 2137  Psych Admission Type (Psych Patients Only)  Admission Status Involuntary  Psychosocial Assessment  Patient Complaints None  Eye Contact Brief  Facial Expression Blank  Affect Preoccupied  Speech Logical/coherent;Elective mutism  Interaction Guarded;Minimal  Motor Activity Slow  Appearance/Hygiene Disheveled  Behavior Characteristics Cooperative  Mood Suspicious;Preoccupied  Thought Process  Coherency Blocking  Content Religiosity;Paranoia;Preoccupation  Delusions Religious  Perception WDL  Hallucination None reported or observed  Judgment Poor  Confusion Mild  Danger to Self  Current suicidal ideation? Denies  Agreement Not to Harm Self Yes  Description of Agreement verbal  Danger to Others  Danger to Others None reported or observed

## 2024-02-05 NOTE — Progress Notes (Signed)
 Pt more vocal on the unit this evening, pt argumentative with writer earlier in the shift, but pt pleasant and appropriate later after talking with Clinical research associate. Pt focused on when he will be D/c , Clinical research associate educated pt on talking with the doctor    02/05/24 2030  Psych Admission Type (Psych Patients Only)  Admission Status Involuntary  Psychosocial Assessment  Patient Complaints Anxiety  Eye Contact Fair  Facial Expression Anxious  Affect Preoccupied  Speech Argumentative;Logical/coherent  Interaction Assertive  Motor Activity Slow  Appearance/Hygiene Disheveled  Behavior Characteristics Cooperative  Mood Suspicious;Preoccupied;Pleasant  Aggressive Behavior  Effect No apparent injury  Thought Process  Coherency Circumstantial  Content Religiosity;Preoccupation  Delusions Religious  Perception WDL  Hallucination None reported or observed  Judgment WDL  Confusion WDL  Danger to Self  Current suicidal ideation? Denies  Danger to Others  Danger to Others None reported or observed

## 2024-02-05 NOTE — Progress Notes (Addendum)
 [facility] MD Progress Note  02/05/2024 10:52 AM Benjamin [name]  MRN:  [id number]  Principal Problem: Psychosis, unspecified psychosis type (HCC) Diagnosis: Principal Problem:   Psychosis, unspecified psychosis type (HCC)   Reason for Admission:  Benjamin [name] is a 43 y.o. male initially admitted at [facility] for poor PO intake and later to [facility] due to concern for decompensated psychosis (hyperreligious delusion). He carries the psychiatric diagnoses of unspecified psychosis formally on Abilify  LAI, PTSD, major depressive disorder, recurrent, with catatonic features requiring ECT in 2018, with psychotic features, cannabis use disorder, OCD (cleaning/straightening/organizing) and has a past medical history of GI bleed.  (admitted on 02/02/2024, total  LOS: 3 days )   ASSESSMENT: Patient's more verbal today suggesting catatonia resolving. Is often verbally manipulative and agitated for which he has received agitation PRNs. Possibly concerning for psychosis. Other consideration is some type of OCD as he appears very particular about his dietary requests. Scheduled ativan  appears to be partially effective. Will increase olanzapine  today and monitor.   PLAN: #Psychosis #Delusional Disorder #Catatonia #MDD #OCD #PTSD -increase zyprexa  zydis to 5 mg in AM and 10 mg at bedtime  -cmp overall improved  -lipid profile wnl  -A1c in process -ativan  2 mg IM once  -Pre-ativan  challenge BFRS 13 - Food and fluid log - Lorazepam  1 mg tid - Daily weights (last ~77 kg) - Consider mirtazapine if low p.o. intake as well as for depression - May benefit from returning to Abilify  LAI given prior success with this -Reached out to outpatient provider to discuss patient's baseline and reason for being on Vyvanse /Adderall.  Disposition Planning: -- Estimated LOS: 7-10 days --Estimated Discharge Date: 7/10 --Barriers to Discharge: catatonia, altered mental status, psychosis -- Discharge Goals: Return home with  outpatient referrals for mental health follow-up including medication management/psychotherapy  Subjective: The patient was seen and evaluated on the unit. On assessment today the patient was more verbal but only in 1-2 word phrases. More responsive today than yesterday but remained largely mute. Minimal eye contact  Objective: Chart Review from last 24 hours:  The patient's chart was reviewed and nursing notes were reviewed. The patient's case was discussed in multidisciplinary team meeting.  - Overnight events to report per chart review / staff report: none - Patient took all prescribed medications - Patient received the following PRNs: haloperidol  5, benadryl  50, ativan  2  Bush Francis Rating Scale Pre ativan  challenge: 13 Immobility 2 Mutism 3 Staring 3 Negativism 2 Withdrawal 2 Excitement 1  Post Ativan  Challenge: 4 Excitement 1 Immobility 1 Mutism 2    Current Medications: Current Facility-Administered Medications  Medication Dose Route Frequency Provider Last Rate Last Admin   acetaminophen  (TYLENOL ) tablet 650 mg  650 mg Oral Q6H PRN McCarty, Artie, MD       alum & mag hydroxide-simeth (MAALOX/MYLANTA) 200-200-20 MG/5ML suspension 30 mL  30 mL Oral Q4H PRN McCarty, Artie, MD       haloperidol  (HALDOL ) tablet 5 mg  5 mg Oral TID PRN McCarty, Artie, MD       And   diphenhydrAMINE  (BENADRYL ) capsule 50 mg  50 mg Oral TID PRN McCarty, Artie, MD       haloperidol  lactate (HALDOL ) injection 5 mg  5 mg Intramuscular TID PRN McCarty, Artie, MD   5 mg at 02/05/24 0421   And   diphenhydrAMINE  (BENADRYL ) injection 50 mg  50 mg Intramuscular TID PRN McCarty, Artie, MD   50 mg at 02/05/24 0421   And   LORazepam  (  ATIVAN ) injection 2 mg  2 mg Intramuscular TID PRN McCarty, Artie, MD   2 mg at 02/05/24 0420   haloperidol  lactate (HALDOL ) injection 10 mg  10 mg Intramuscular TID PRN McCarty, Artie, MD       And   diphenhydrAMINE  (BENADRYL ) injection 50 mg  50 mg Intramuscular TID  PRN McCarty, Artie, MD   50 mg at 02/03/24 1115   And   LORazepam  (ATIVAN ) injection 2 mg  2 mg Intramuscular TID PRN McCarty, Artie, MD   2 mg at 02/03/24 1114   feeding supplement (ENSURE PLUS HIGH PROTEIN) liquid 237 mL  237 mL Oral BID BM Lynnette Barter, MD   237 mL at 02/05/24 1012   LORazepam  (ATIVAN ) tablet 1 mg  1 mg Oral Q8H Sierah Lacewell, MD   1 mg at 02/05/24 9164   OLANZapine  zydis (ZYPREXA ) disintegrating tablet 10 mg  10 mg Oral QHS Daniele Dillow, MD   10 mg at 02/04/24 2137   OLANZapine  zydis (ZYPREXA ) disintegrating tablet 5 mg  5 mg Oral Daily Makayli Bracken, MD   5 mg at 02/05/24 1013    Lab Results:  Results for orders placed or performed during the hospital encounter of 02/02/24 (from the past 48 hours)  Comprehensive metabolic panel     Status: Abnormal   Collection Time: 02/04/24  6:37 AM  Result Value Ref Range   Sodium 136 135 - 145 mmol/L   Potassium 4.0 3.5 - 5.1 mmol/L   Chloride 103 98 - 111 mmol/L   CO2 25 22 - 32 mmol/L   Glucose, Bld 98 70 - 99 mg/dL    Comment: Glucose reference range applies only to samples taken after fasting for at least 8 hours.   BUN 23 (H) 6 - 20 mg/dL   Creatinine, Ser 8.88 0.61 - 1.24 mg/dL   Calcium 9.1 8.9 - 89.6 mg/dL   Total Protein 7.4 6.5 - 8.1 g/dL   Albumin 4.0 3.5 - 5.0 g/dL   AST 40 15 - 41 U/L   ALT 48 (H) 0 - 44 U/L   Alkaline Phosphatase 43 38 - 126 U/L   Total Bilirubin 0.5 0.0 - 1.2 mg/dL   GFR, Estimated >39 >39 mL/min    Comment: (NOTE) Calculated using the CKD-EPI Creatinine Equation (2021)    Anion gap 8 5 - 15    Comment: Performed at [facility], [address], [place], [place] [address]  Lipid panel     Status: Abnormal   Collection Time: 02/04/24  6:37 AM  Result Value Ref Range   Cholesterol 153 0 - 200 mg/dL   Triglycerides 844 (H) <150 mg/dL   HDL 29 (L) >59 mg/dL   Total CHOL/HDL Ratio 5.3 RATIO   VLDL 31 0 - 40 mg/dL   LDL Cholesterol 93 0 - 99 mg/dL    Comment:        Total  Cholesterol/HDL:CHD Risk Coronary Heart Disease Risk Table                     Men   Women  1/2 Average Risk   3.4   3.3  Average Risk       5.0   4.4  2 X Average Risk   9.6   7.1  3 X Average Risk  23.4   11.0        Use the calculated Patient Ratio above and the CHD Risk Table to determine the patient's CHD Risk.  ATP III CLASSIFICATION (LDL):  <100     mg/dL   Optimal  899-870  mg/dL   Near or Above                    Optimal  130-159  mg/dL   Borderline  839-810  mg/dL   High  >809     mg/dL   Very High Performed at [facility], [address] [place], [place] [address]   Hemoglobin A1c     Status: None   Collection Time: 02/04/24  6:37 AM  Result Value Ref Range   Hgb A1c MFr Bld 5.4 4.8 - 5.6 %    Comment: (NOTE)         Prediabetes: 5.7 - 6.4         Diabetes: >6.4         Glycemic control for adults with diabetes: <7.0    Mean Plasma Glucose 108 mg/dL    Comment: (NOTE) Performed At: [facility] [address] [place], [place] [id number] Jennette Shorter MD Ey:[id number]     Blood Alcohol level:  Lab Results  Component Value Date   [facility] <15 01/28/2024   ETH <10 10/27/2019    Metabolic Labs: Lab Results  Component Value Date   HGBA1C 5.4 02/04/2024   MPG 108 02/04/2024   MPG 103 07/15/2016   No results found for: PROLACTIN Lab Results  Component Value Date   CHOL 153 02/04/2024   TRIG 155 (H) 02/04/2024   HDL 29 (L) 02/04/2024   CHOLHDL 5.3 02/04/2024   VLDL 31 02/04/2024   LDLCALC 93 02/04/2024   LDLCALC 140 (H) 07/15/2016    Physical Findings: AIMS: No  CIWA:    COWS:     Psychiatric Specialty Exam: General Appearance: disheveled  Eye Contact: minimal  Speech: minimal  Volume: decreased  Mood: ok  Affect: flat  Thought Content: unable to determine  Suicidal Thoughts: denies  Homicidal Thoughts: denies  Thought Process: unable to determine  Orientation: unable to determine    Memory: unable to  determine  Judgment: unable to determine  Insight: unable to determine  Concentration: unable to determine  Recall: unable to determine  Fund of Knowledge: unable to determine  Language: unable to determine  Psychomotor Activity: psychomotor retardation  Assets: unable to determine     Review of Systems ROS  Vital Signs: Blood pressure 114/82, pulse 80, temperature 98.6 F (37 C), temperature source Oral, resp. rate 16, height 5' 10 (1.778 m), weight 77.3 kg, SpO2 100%. Body mass index is 24.45 kg/m. Physical Exam  I certify that inpatient services furnished can reasonably be expected to improve the patient's condition.   Signed: Prentice Espy, MD 02/05/2024, 10:52 AM

## 2024-02-05 NOTE — Plan of Care (Signed)
   Problem: Education: Goal: Knowledge of [facility] Education information/materials will improve Outcome: Progressing Goal: Emotional status will improve Outcome: Progressing Goal: Mental status will improve Outcome: Progressing Goal: Verbalization of understanding the information provided will improve Outcome: Progressing

## 2024-02-05 NOTE — Group Note (Signed)
 Date:  02/05/2024 Time:  8:28 PM  Group Topic/Focus:  Wrap-Up Group:   The focus of this group is to help patients review their daily goal of treatment and discuss progress on daily workbooks.    Participation Level:  Did Not Attend    Sana Tessmer Dacosta 02/05/2024, 8:28 PM

## 2024-02-05 NOTE — Progress Notes (Signed)
 Per pt history( from previous [facility] admissions and [facility]) , pt becomes catatonic until he gets his IM Ativan .
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
## 2024-02-05 NOTE — Progress Notes (Signed)
 Pt restless and pacing the hallway. When inquired if he needed anything, pt requested a beverage. Informed pt of drink options. Upon providing pt with a drink, pt became argumentative and demanded an alternate drink. Pt proceeded to angrily decline. Pt continued to pace the hallway. When staff attempted to redirect pt to return to bedroom, pt would ignore staff's inquiries and continue to disregard staff instructions. Initially pt would engage in conversation with staff but would then quit replying and ignore staff.  Pt observed to be agitated. Offered PO agitation protocol but pt declined. Pt was given IM agitation protocol per [facility] without a hold.

## 2024-02-05 NOTE — Progress Notes (Signed)
 Pt very argumentative and demanding. Pt had selective mutism since coming in, but tries to be manipulative when he talks. Pt stated he was thirsty and wanted some juice or milk. Pt was informed that we had ginger ale, Gatorade or water . Pt began to argue with his nurse that we were not offering him anything to quench his thirst. Pt continued to present with defiant behavior towards his nurse and other staff members. Pt nurse offered him PO pills for agitation, but pt refused , so IM medication given per [facility] without incident.

## 2024-02-05 NOTE — [facility] Suicide Risk Assessment (Signed)
 [facility] INPATIENT:  Family/Significant Other Suicide Prevention Education  Suicide Prevention Education:  Contact Attempts: Philippe Gang (brother) [phone], (name of family member/significant other) has been identified by the patient as the family member/significant other with whom the patient will be residing, and identified as the person(s) who will aid the patient in the event of a mental health crisis.  With written consent from the patient, two attempts were made to provide suicide prevention education, prior to and/or following the patient's discharge.  We were unsuccessful in providing suicide prevention education.  A suicide education pamphlet was given to the patient to share with family/significant other.  Date and time of first attempt:02/05/2024 / 5:10 PM.  CSW left a voicemail.   Jamiel Goncalves O Kyesha Balla 02/05/2024, 5:12 PM

## 2024-02-05 NOTE — Plan of Care (Signed)
   Problem: Safety: Goal: Periods of time without injury will increase Outcome: Progressing

## 2024-02-06 NOTE — [facility] Group Notes (Signed)
 Adult Psychoeducational Group Note  Date:  02/06/2024 Time:  12:03 PM  Group Topic/Focus:  Goals Group:   The focus of this group is to help patients establish daily goals to achieve during treatment and discuss how the patient can incorporate goal setting into their daily lives to aide in recovery.  Participation Level:  Did Not Attend  Participation Quality:    Affect:    Cognitive:    Insight:   Engagement in Group:    Modes of Intervention:    Additional Comments:    Benjamin [name] 02/06/2024, 12:03 PM

## 2024-02-06 NOTE — Plan of Care (Signed)
  Problem: Education: Goal: Mental status will improve Outcome: Not Progressing   Problem: Activity: Goal: Interest or engagement in activities will improve Outcome: Not Progressing

## 2024-02-06 NOTE — Progress Notes (Signed)
   02/06/24 1100  Psych Admission Type (Psych Patients Only)  Admission Status Involuntary  Psychosocial Assessment  Patient Complaints Anxiety  Eye Contact Fair  Facial Expression Blank  Affect Preoccupied  Speech Elective mutism  Interaction Minimal  Motor Activity Slow  Appearance/Hygiene Disheveled  Behavior Characteristics Unwilling to participate  Mood Preoccupied  Thought Process  Coherency Circumstantial  Content Preoccupation  Delusions Religious  Perception WDL  Hallucination None reported or observed  Judgment Poor  Confusion Mild  Danger to Self  Current suicidal ideation? Denies  Danger to Others  Danger to Others None reported or observed   Dar Note: Patient presents with a flat affect and depressed mood.  Appears paranoid, guarded and suspicious.  Reports people following him but doesn't want to discuss it while in the hospital.  Medications given as prescribed.  Patient is withdrawn and isolative to his room.  Food and fluid intake encouraged.  Patient is safe on the unit.

## 2024-02-06 NOTE — Progress Notes (Signed)
 [facility] MD Progress Note  02/06/2024 2:56 PM Benjamin [name]  MRN:  [id number] Subjective:   Benjamin [name] is a 43 yr old male who presented on 6/26 to [facility] with his family due to not eating or drinking for 2 weeks and reporting he was on a mission from God and he was admitted for stabilization, he was admitted to [facility] on 7/2.  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   Case was discussed in the multidisciplinary team. MAR was reviewed and patient was compliant with medications.  He received no PRN medications yesterday.   Psychiatric Team made the following recommendations yesterday: -increase zyprexa  zydis to 5 mg in AM and 10 mg at bedtime  -Continue Ativan  1 mg TID   On interview today patient reports he slept good last night.  He reports his appetite is doing good.  He reports no SI, HI, or AVH.  He reports no Paranoia or Ideas of Reference.  He reports no issues with his medications.  He reports that he is tired.  He reports he usually takes a medication to help him be more awake Vyvanse .  Discussed with him that we currently have him on a medication, Ativan , to help with his symptoms that can make him more tired.  Discussed with him that we would begin to decrease it in a few days.  Discussed that his outpatient provider could restart his Vyvanse  when determined appropriate and he reported understanding.  He reports no other concerns at present.   Principal Problem: Psychosis, unspecified psychosis type (HCC) Diagnosis: Principal Problem:   Psychosis, unspecified psychosis type (HCC)  Total Time spent with patient:  I personally spent 35 minutes on the unit in direct patient care. The direct patient care time included  face-to-face time with the patient, reviewing the patient's chart, communicating with other professionals, and coordinating care. Greater than 50% of this time was spent in counseling or coordinating care with the patient regarding goals of hospitalization, psycho-education, and discharge planning needs.   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  Past Medical History:  Past Medical History:  Diagnosis Date   Depression    Hematemesis 08/2017   Herniated disc    x3   Hypertension    Mental disorder     Past Surgical History:  Procedure Laterality Date   ESOPHAGOGASTRODUODENOSCOPY Left 08/27/2017   Procedure: ESOPHAGOGASTRODUODENOSCOPY (EGD);  Surgeon: Rollin Dover, MD;  Location: [facility] ENDOSCOPY;  Service: Endoscopy;  Laterality: Left;   SHOULDER SURGERY     WISDOM TOOTH EXTRACTION     Family History:  Family History  Problem Relation Age of Onset   Seizures Mother    Stroke Mother    Diabetes Mellitus II Mother    Family Psychiatric  History:  Father- Suicide Brother- Depression  Social History:  Social History   Substance and Sexual Activity  Alcohol Use No     Social History   Substance and Sexual Activity  Drug Use Yes   Types: Marijuana    Social History   Socioeconomic History   Marital status: Single  Spouse name: Not on file   Number of children: Not on file   Years of education: Not on file   Highest education level: Not on file  Occupational History   Not on file  Tobacco Use   Smoking status: Former    Current packs/day: 0.00    Types: Cigarettes    Quit date: 04/04/2012    Years since quitting: 11.8   Smokeless tobacco: Never   Tobacco comments:    Pt is nonverbal. Unknown.    Vaping Use   Vaping status: Never Used  Substance and Sexual Activity   Alcohol use: No   Drug use: Yes    Types: Marijuana   Sexual activity: Never  Other Topics Concern   Not on file  Social History Narrative   Not on file   Social Drivers of Health   Financial Resource Strain: Not on file  Food Insecurity: Patient Unable To Answer (02/02/2024)   Hunger Vital Sign    Worried About Running Out of Food in the Last Year: Patient unable to answer    Ran Out of Food in the Last Year: Patient unable to answer  Recent Concern: Food Insecurity - Food Insecurity Present (01/28/2024)   Hunger Vital Sign    Worried About Running Out of Food in the Last Year: Sometimes true    Ran Out of Food in the Last Year: Sometimes true  Transportation Needs: Patient Unable To Answer (02/02/2024)   PRAPARE - Transportation    Lack of Transportation (Medical): Patient unable to answer    Lack of Transportation (Non-Medical): Patient unable to answer  Physical Activity: Not on file  Stress: Not on file (06/11/2023)  Social Connections: Unknown (01/28/2024)   Social Connection and Isolation Panel    Frequency of Communication with Friends and Family: Patient unable to answer    Frequency of Social Gatherings with Friends and Family: Patient unable to answer    Attends Religious Services: Patient unable to answer    Active Member of Clubs or Organizations: Patient unable to answer    Attends Banker Meetings: Patient unable to answer    Marital Status: Married   Additional Social History:                         Sleep: Good Estimated Sleeping Duration (Last 24 Hours): 7.75-9.25 hours  Appetite:  Good  Current Medications: Current Facility-Administered Medications  Medication Dose Route Frequency Provider Last Rate Last Admin   acetaminophen  (TYLENOL ) tablet 650 mg  650 mg Oral Q6H PRN McCarty, Artie, MD       alum & mag hydroxide-simeth (MAALOX/MYLANTA) 200-200-20 MG/5ML  suspension 30 mL  30 mL Oral Q4H PRN McCarty, Artie, MD       haloperidol  (HALDOL ) tablet 5 mg  5 mg Oral TID PRN McCarty, Artie, MD       And   diphenhydrAMINE  (BENADRYL ) capsule 50 mg  50 mg Oral TID PRN McCarty, Artie, MD       haloperidol  lactate (HALDOL ) injection 5 mg  5 mg Intramuscular TID PRN McCarty, Artie, MD   5 mg at 02/05/24 0421   And   diphenhydrAMINE  (BENADRYL ) injection 50 mg  50 mg Intramuscular TID PRN McCarty, Artie, MD   50 mg at 02/05/24 0421   And   LORazepam  (ATIVAN ) injection 2 mg  2 mg Intramuscular TID PRN McCarty, Artie, MD   2 mg at 02/05/24 0420   haloperidol  lactate (HALDOL ) injection  10 mg  10 mg Intramuscular TID PRN McCarty, Artie, MD       And   diphenhydrAMINE  (BENADRYL ) injection 50 mg  50 mg Intramuscular TID PRN McCarty, Artie, MD   50 mg at 02/03/24 1115   And   LORazepam  (ATIVAN ) injection 2 mg  2 mg Intramuscular TID PRN McCarty, Artie, MD   2 mg at 02/03/24 1114   feeding supplement (ENSURE PLUS HIGH PROTEIN) liquid 237 mL  237 mL Oral BID BM Ji, Andrew, MD   237 mL at 02/06/24 1214   hydrOXYzine  (ATARAX ) tablet 25 mg  25 mg Oral TID PRN Ajibola, Ene A, NP       LORazepam  (ATIVAN ) tablet 1 mg  1 mg Oral Q8H Lynnette Barter, MD   1 mg at 02/06/24 9177   OLANZapine  zydis (ZYPREXA ) disintegrating tablet 10 mg  10 mg Oral QHS Ji, Andrew, MD   10 mg at 02/05/24 2017   OLANZapine  zydis (ZYPREXA ) disintegrating tablet 5 mg  5 mg Oral Daily Ji, Andrew, MD   5 mg at 02/06/24 9177    Lab Results: No results found for this or any previous visit (from the past 48 hours).  Blood Alcohol level:  Lab Results  Component Value Date   [facility] <15 01/28/2024   ETH <10 10/27/2019    Metabolic Disorder Labs: Lab Results  Component Value Date   HGBA1C 5.4 02/04/2024   MPG 108 02/04/2024   MPG 103 07/15/2016   No results found for: PROLACTIN Lab Results  Component Value Date   CHOL 153 02/04/2024   TRIG 155 (H) 02/04/2024   HDL 29 (L) 02/04/2024   CHOLHDL 5.3  02/04/2024   VLDL 31 02/04/2024   LDLCALC 93 02/04/2024   LDLCALC 140 (H) 07/15/2016    Physical Findings: AIMS:  ,  ,  ,  ,  ,  ,   CIWA:    COWS:     Musculoskeletal: Strength & Muscle Tone: within normal limits Gait & Station: normal Patient leans: N/A  Psychiatric Specialty Exam:  Presentation  General Appearance: Casual  Eye Contact:Minimal  Speech:Clear and Coherent; Slow  Speech Volume:Normal  Handedness:No data recorded  Mood and Affect  Mood:Dysphoric  Affect:Flat; Constricted   Thought Process  Thought Processes:Linear  Descriptions of Associations:Intact  Orientation:Partial  Thought Content:Paranoid Ideation  History of Schizophrenia/Schizoaffective disorder:Yes  Duration of Psychotic Symptoms:Greater than six months  Hallucinations:Hallucinations: None  Ideas of Reference:None  Suicidal Thoughts:Suicidal Thoughts: No  Homicidal Thoughts:Homicidal Thoughts: No   Sensorium  Memory:Immediate Fair  Judgment:Intact  Insight:Present   Executive Functions  Concentration:Fair  Attention Span:Fair  Recall:Fair  Fund of Knowledge:Fair  Language:Fair   Psychomotor Activity  Psychomotor Activity:Psychomotor Activity: Decreased   Assets  Assets:Resilience   Sleep  Sleep:Sleep: Good    Physical Exam: Physical Exam Vitals and nursing note reviewed.  Constitutional:      General: He is not in acute distress.    Appearance: Normal appearance. He is normal weight. He is not ill-appearing or toxic-appearing.  HENT:     Head: Normocephalic and atraumatic.  Pulmonary:     Effort: Pulmonary effort is normal.  Musculoskeletal:        General: Normal range of motion.  Neurological:     General: No focal deficit present.     Mental Status: He is alert.    Review of Systems  Respiratory:  Negative for cough and shortness of breath.   Cardiovascular:  Negative for chest pain.  Gastrointestinal:  Negative for abdominal  pain, constipation, diarrhea, nausea and vomiting.  Neurological:  Negative for dizziness, weakness and headaches.  Psychiatric/Behavioral:  Negative for depression, hallucinations and suicidal ideas. The patient is not nervous/anxious.    Blood pressure 114/74, pulse 95, temperature 98.4 F (36.9 C), temperature source Oral, resp. rate 16, height 5' 10 (1.778 m), weight 77.3 kg, SpO2 97%. Body mass index is 24.45 kg/m.   Treatment Plan Summary: Daily contact with patient to assess and evaluate symptoms and progress in treatment and Medication management  Benjamin [name] is a 43 yr old male who presented on 6/26 to [facility] with his family due to not eating or drinking for 2 weeks and reporting he was on a mission from God and he was admitted for stabilization, he was admitted to [facility] on 7/2.  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    Benjamin [name] has responded well to the Ativan  and is more interactive, however, he continues to be withdrawn.  Staff is reporting he is endorsing paranoia and so we may increase his evening dose of Zyprexa  to avoid further daytime sedation.  We will not make any changes to his medications at this time.  We will continue to monitor.    Psychosis  MDD  OCD  PTSD: -Continue Zyprexa  5 mg AM and 10 mg QHS for psychosis -Continue Ativan  1 mg TID for Catatonia -Continue Agitation Protocol: Haldol /Ativan /Benadryl    Levy Leech Rating Scale Pre ativan  challenge: 13 Immobility 2 Mutism 3 Staring 3 Negativism 2 Withdrawal 2 Excitement 1   Post Ativan  Challenge: 4 Excitement 1 Immobility 1 Mutism 2   -Continue Ensure 237 mL BID -Continue PRN's: Tylenol , Maalox, Atarax    --  The  risks/benefits/side-effects/alternatives to medications were discussed in detail with the patient and time was given for questions. The patient consents to medication trials.                -- Metabolic profile and EKG monitoring obtained while on an atypical antipsychotic (BMI: Lipid Panel: HbgA1c: QTc:)              -- Encouraged patient to participate in unit milieu and in scheduled group therapies              -- Short Term Goals: Ability to identify changes in lifestyle to reduce recurrence of condition will improve, Ability to verbalize feelings will improve, Ability to disclose and discuss suicidal ideas, Ability to demonstrate self-control will improve, Ability to identify and develop effective coping behaviors will improve, Ability to maintain clinical measurements within normal limits will improve, Compliance with prescribed medications will improve, and Ability to identify triggers associated with substance abuse/mental health issues will improve             -- Long Term Goals: Improvement in symptoms so as ready for discharge   Safety and Monitoring:             -- Involuntary admission to inpatient psychiatric unit for safety, stabilization and treatment             -- Daily contact with patient to assess and evaluate symptoms and progress in treatment             -- Patient's case to be discussed in multi-disciplinary team meeting             --  Observation Level : q15 minute checks             -- Vital signs:  q12 hours             -- Precautions: suicide, elopement, and assault  Discharge Planning:              -- Social work and case management to assist with discharge planning and identification of hospital follow-up needs prior to discharge             -- Estimated LOS: 4-6 more days             -- Discharge Concerns: Need to establish a safety plan; Medication compliance and effectiveness             -- Discharge Goals: Return home with outpatient referrals for mental health  follow-up including medication management/psychotherapy    Marsa GORMAN Rosser, DO 02/06/2024, 2:56 PM

## 2024-02-06 NOTE — Plan of Care (Signed)
  Problem: Education: Goal: Knowledge of [facility] Education information/materials will improve Outcome: Progressing Goal: Mental status will improve Outcome: Progressing Goal: Verbalization of understanding the information provided will improve Outcome: Progressing   Problem: Education: Goal: Emotional status will improve Outcome: Not Progressing

## 2024-02-06 NOTE — Progress Notes (Signed)
   02/06/24 2200  Psych Admission Type (Psych Patients Only)  Admission Status Involuntary  Psychosocial Assessment  Patient Complaints Anxiety  Eye Contact Fair  Facial Expression Blank  Affect Preoccupied  Speech Elective mutism  Interaction Minimal;No initiation  Motor Activity Slow  Appearance/Hygiene Disheveled  Behavior Characteristics Appropriate to situation  Mood Preoccupied  Thought Process  Coherency Circumstantial  Content Preoccupation  Delusions Religious  Perception WDL  Hallucination None reported or observed  Judgment Poor  Confusion WDL  Danger to Self  Current suicidal ideation? Denies  Agreement Not to Harm Self Yes  Description of Agreement Verbal  Danger to Others  Danger to Others None reported or observed

## 2024-02-07 MED ORDER — LORAZEPAM 1 MG PO TABS
1.0000 mg | ORAL_TABLET | Freq: Two times a day (BID) | ORAL | Status: DC
  Administered 2024-02-07 – 2024-02-09 (×4): 1 mg via ORAL
  Filled 2024-02-07 (×4): qty 1

## 2024-02-07 MED ORDER — LORAZEPAM 0.5 MG PO TABS
0.5000 mg | ORAL_TABLET | Freq: Every day | ORAL | Status: DC
  Administered 2024-02-07 – 2024-02-08 (×2): 0.5 mg via ORAL
  Filled 2024-02-07 (×2): qty 1

## 2024-02-07 MED ORDER — OLANZAPINE 15 MG PO TBDP
15.0000 mg | ORAL_TABLET | Freq: Every day | ORAL | Status: DC
  Administered 2024-02-07: 15 mg via ORAL
  Filled 2024-02-07: qty 1

## 2024-02-07 NOTE — [facility] Group Notes (Signed)
 [facility] Group Notes:  (Nursing/MHT/Case Management/Adjunct)  Date:  02/07/2024  Time:  8:52 PM  Type of Therapy:  Wrap-up group  Participation Level:  Did Not Attend  Participation Quality:    Affect:    Cognitive:    Insight:    Engagement in Group:    Modes of Intervention:    Summary of Progress/Problems: Pt refused group. Writer provided a worksheet on coping skills.   Benjamin [name] 02/07/2024, 8:52 PM

## 2024-02-07 NOTE — [facility] Group Notes (Signed)
 Adult Psychoeducational Group Note  Date:  02/07/2024 Time:  3:43 PM  Group Topic/Focus:  Goals Group:   The focus of this group is to help patients establish daily goals to achieve during treatment and discuss how the patient can incorporate goal setting into their daily lives to aide in recovery. Orientation:   The focus of this group is to educate the patient on the purpose and policies of crisis stabilization and provide a format to answer questions about their admission.  The group details unit policies and expectations of patients while admitted.  Participation Level:  Did Not Attend  Participation Quality:    Affect:    Cognitive:    Insight:   Engagement in Group:    Modes of Intervention:    Additional Comments:    Benjamin [name] O 02/07/2024, 3:43 PM

## 2024-02-07 NOTE — Progress Notes (Signed)
   02/07/24 1000  Psych Admission Type (Psych Patients Only)  Admission Status Involuntary  Psychosocial Assessment  Patient Complaints Anxiety;Suspiciousness  Eye Contact Fair  Facial Expression Anxious  Affect Preoccupied  Speech Elective mutism  Interaction Minimal;No initiation  Motor Activity Slow  Appearance/Hygiene Disheveled  Behavior Characteristics Guarded  Mood Preoccupied  Thought Process  Coherency Circumstantial  Content Preoccupation  Delusions Religious  Perception WDL  Hallucination None reported or observed  Judgment Poor  Confusion WDL  Danger to Self  Current suicidal ideation? Denies  Description of Suicide Plan none  Agreement Not to Harm Self Yes  Description of Agreement verbal  Danger to Others  Danger to Others None reported or observed

## 2024-02-07 NOTE — Plan of Care (Signed)
  Problem: Education: Goal: Emotional status will improve Outcome: Progressing Goal: Verbalization of understanding the information provided will improve Outcome: Progressing   Problem: Coping: Goal: Ability to verbalize frustrations and anger appropriately will improve Outcome: Progressing   Problem: Health Behavior/Discharge Planning: Goal: Compliance with treatment plan for underlying cause of condition will improve Outcome: Progressing   Problem: Safety: Goal: Periods of time without injury will increase Outcome: Progressing   Problem: Education: Goal: Will be free of psychotic symptoms Outcome: Progressing

## 2024-02-07 NOTE — Progress Notes (Signed)
 [facility] MD Progress Note  02/07/2024 12:04 PM Benjamin [name]  MRN:  [id number] Subjective:   Benjamin [name] is a 43 yr old male who presented on 6/26 to [facility] with his family due to not eating or drinking for 2 weeks and reporting he was on a mission from God and he was admitted for stabilization, he was admitted to [facility] on 7/2.  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   Case was discussed in the multidisciplinary team. MAR was reviewed and patient was compliant with medications.  He did not require any PRN medications yesterday.   Psychiatric Team made the following recommendations yesterday: -Continue Zyprexa  5 mg AM and 10 mg QHS for psychosis -Continue Ativan  1 mg TID for Catatonia   On interview today patient reports he slept good last night.  He reports his appetite is doing good.  He reports no SI, HI, or AVH.  He reports no issues with his medications except some sedation.  Discussed with him that the Ativan  was most likely causing some of this and the fogginess that he is reporting.  Discussed the necessity of this medication but that we could begin to reduce it some.  Discussed the need to reduce it slowly so that his symptoms of Catatonia did not return and he reported understanding.  He reports no other concerns at present.    Principal Problem: Psychosis, unspecified psychosis type (HCC) Diagnosis: Principal Problem:   Psychosis, unspecified psychosis type (HCC)  Total Time spent with patient:  I personally spent 35 minutes on the unit in direct patient care. The direct patient care time included face-to-face time with the patient, reviewing the patient's chart, communicating with other professionals, and  coordinating care. Greater than 50% of this time was spent in counseling or coordinating care with the patient regarding goals of hospitalization, psycho-education, and discharge planning needs.   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  Past Medical History:  Past Medical History:  Diagnosis Date   Depression    Hematemesis 08/2017   Herniated disc    x3   Hypertension    Mental disorder     Past Surgical History:  Procedure Laterality Date   ESOPHAGOGASTRODUODENOSCOPY Left 08/27/2017   Procedure: ESOPHAGOGASTRODUODENOSCOPY (EGD);  Surgeon: Rollin Dover, MD;  Location: [facility] ENDOSCOPY;  Service: Endoscopy;  Laterality: Left;   SHOULDER SURGERY     WISDOM TOOTH EXTRACTION     Family History:  Family History  Problem Relation Age of Onset   Seizures Mother    Stroke Mother    Diabetes Mellitus II Mother    Family Psychiatric  History:  Father- Suicide Brother- Depression  Social History:  Social History   Substance and Sexual Activity  Alcohol Use No     Social History   Substance and Sexual Activity  Drug Use Yes   Types: Marijuana    Social History   Socioeconomic History   Marital status: Single    Spouse name: Not on file   Number of children: Not on file   Years of education: Not on file   Highest  education level: Not on file  Occupational History   Not on file  Tobacco Use   Smoking status: Former    Current packs/day: 0.00    Types: Cigarettes    Quit date: 04/04/2012    Years since quitting: 11.8   Smokeless tobacco: Never   Tobacco comments:    Pt is nonverbal. Unknown.   Vaping Use   Vaping status: Never Used  Substance and Sexual Activity   Alcohol use: No   Drug use: Yes     Types: Marijuana   Sexual activity: Never  Other Topics Concern   Not on file  Social History Narrative   Not on file   Social Drivers of Health   Financial Resource Strain: Not on file  Food Insecurity: Patient Unable To Answer (02/02/2024)   Hunger Vital Sign    Worried About Running Out of Food in the Last Year: Patient unable to answer    Ran Out of Food in the Last Year: Patient unable to answer  Recent Concern: Food Insecurity - Food Insecurity Present (01/28/2024)   Hunger Vital Sign    Worried About Running Out of Food in the Last Year: Sometimes true    Ran Out of Food in the Last Year: Sometimes true  Transportation Needs: Patient Unable To Answer (02/02/2024)   PRAPARE - Transportation    Lack of Transportation (Medical): Patient unable to answer    Lack of Transportation (Non-Medical): Patient unable to answer  Physical Activity: Not on file  Stress: Not on file (06/11/2023)  Social Connections: Unknown (01/28/2024)   Social Connection and Isolation Panel    Frequency of Communication with Friends and Family: Patient unable to answer    Frequency of Social Gatherings with Friends and Family: Patient unable to answer    Attends Religious Services: Patient unable to answer    Active Member of Clubs or Organizations: Patient unable to answer    Attends Banker Meetings: Patient unable to answer    Marital Status: Married   Additional Social History:                         Sleep: Good Estimated Sleeping Duration (Last 24 Hours): 6.00-7.00 hours  Appetite:  Good  Current Medications: Current Facility-Administered Medications  Medication Dose Route Frequency Provider Last Rate Last Admin   acetaminophen  (TYLENOL ) tablet 650 mg  650 mg Oral Q6H PRN McCarty, Artie, MD       alum & mag hydroxide-simeth (MAALOX/MYLANTA) 200-200-20 MG/5ML suspension 30 mL  30 mL Oral Q4H PRN McCarty, Artie, MD       haloperidol  (HALDOL ) tablet 5 mg  5 mg Oral TID  PRN McCarty, Artie, MD       And   diphenhydrAMINE  (BENADRYL ) capsule 50 mg  50 mg Oral TID PRN McCarty, Artie, MD       haloperidol  lactate (HALDOL ) injection 5 mg  5 mg Intramuscular TID PRN McCarty, Artie, MD   5 mg at 02/05/24 0421   And   diphenhydrAMINE  (BENADRYL ) injection 50 mg  50 mg Intramuscular TID PRN McCarty, Artie, MD   50 mg at 02/05/24 0421   And   LORazepam  (ATIVAN ) injection 2 mg  2 mg Intramuscular TID PRN McCarty, Artie, MD   2 mg at 02/05/24 9579   haloperidol  lactate (HALDOL ) injection 10 mg  10 mg Intramuscular TID PRN McCarty, Artie, MD       And   diphenhydrAMINE  (BENADRYL ) injection 50  mg  50 mg Intramuscular TID PRN McCarty, Artie, MD   50 mg at 02/03/24 1115   And   LORazepam  (ATIVAN ) injection 2 mg  2 mg Intramuscular TID PRN McCarty, Artie, MD   2 mg at 02/03/24 1114   feeding supplement (ENSURE PLUS HIGH PROTEIN) liquid 237 mL  237 mL Oral BID BM Lynnette Barter, MD   237 mL at 02/07/24 0920   hydrOXYzine  (ATARAX ) tablet 25 mg  25 mg Oral TID PRN Ajibola, Ene A, NP       LORazepam  (ATIVAN ) tablet 0.5 mg  0.5 mg Oral Q1400 Sayan Aldava S, DO       LORazepam  (ATIVAN ) tablet 1 mg  1 mg Oral BID Koda Defrank S, DO       OLANZapine  zydis (ZYPREXA ) disintegrating tablet 15 mg  15 mg Oral QHS Senay Sistrunk S, DO       OLANZapine  zydis (ZYPREXA ) disintegrating tablet 5 mg  5 mg Oral Daily Ji, Andrew, MD   5 mg at 02/07/24 9185    Lab Results: No results found for this or any previous visit (from the past 48 hours).  Blood Alcohol level:  Lab Results  Component Value Date   [facility] <15 01/28/2024   ETH <10 10/27/2019    Metabolic Disorder Labs: Lab Results  Component Value Date   HGBA1C 5.4 02/04/2024   MPG 108 02/04/2024   MPG 103 07/15/2016   No results found for: PROLACTIN Lab Results  Component Value Date   CHOL 153 02/04/2024   TRIG 155 (H) 02/04/2024   HDL 29 (L) 02/04/2024   CHOLHDL 5.3 02/04/2024   VLDL 31 02/04/2024   LDLCALC  93 02/04/2024   LDLCALC 140 (H) 07/15/2016    Physical Findings: AIMS:  ,  ,  ,  ,  ,  ,   CIWA:    COWS:     Musculoskeletal: Strength & Muscle Tone: within normal limits Gait & Station: normal Patient leans: N/A  Psychiatric Specialty Exam:  Presentation  General Appearance: Casual  Eye Contact:Fair  Speech:Clear and Coherent; Normal Rate  Speech Volume:Normal  Handedness:No data recorded  Mood and Affect  Mood:Dysphoric  Affect:Flat; Constricted   Thought Process  Thought Processes:Linear; Goal Directed  Descriptions of Associations:Intact  Orientation:Full (Time, Place and Person)  Thought Content:Paranoid Ideation  History of Schizophrenia/Schizoaffective disorder:Yes  Duration of Psychotic Symptoms:Greater than six months  Hallucinations:Hallucinations: None  Ideas of Reference:None  Suicidal Thoughts:Suicidal Thoughts: No  Homicidal Thoughts:Homicidal Thoughts: No   Sensorium  Memory:Immediate Fair  Judgment:Intact  Insight:Present   Executive Functions  Concentration:Fair  Attention Span:Fair  Recall:Fair  Fund of Knowledge:Fair  Language:Fair   Psychomotor Activity  Psychomotor Activity:Psychomotor Activity: Normal   Assets  Assets:Resilience   Sleep  Sleep:Sleep: Good    Physical Exam: Physical Exam Vitals and nursing note reviewed.  Constitutional:      General: He is not in acute distress.    Appearance: Normal appearance. He is normal weight. He is not ill-appearing or toxic-appearing.  HENT:     Head: Normocephalic and atraumatic.  Pulmonary:     Effort: Pulmonary effort is normal.  Musculoskeletal:        General: Normal range of motion.  Neurological:     General: No focal deficit present.     Mental Status: He is alert.    Review of Systems  Respiratory:  Negative for cough and shortness of breath.   Cardiovascular:  Negative for chest pain.  Gastrointestinal:  Negative  for abdominal pain,  constipation, diarrhea, nausea and vomiting.  Neurological:  Negative for dizziness, weakness and headaches.  Psychiatric/Behavioral:  Negative for depression, hallucinations and suicidal ideas. The patient is not nervous/anxious.    Blood pressure 101/82, pulse 82, temperature 98 F (36.7 C), temperature source Oral, resp. rate 14, height 5' 10 (1.778 m), weight 77.3 kg, SpO2 97%. Body mass index is 24.45 kg/m.   Treatment Plan Summary: Daily contact with patient to assess and evaluate symptoms and progress in treatment and Medication management  Benjamin [name] is a 43 yr old male who presented on 6/26 to [facility] with his family due to not eating or drinking for 2 weeks and reporting he was on a mission from God and he was admitted for stabilization, he was admitted to [facility] on 7/2.  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    Deke is showing some improvement as he is more talkative during the interview and gives more complete answers.  He continues to be paranoid when talking with staff so we will further increase his evening dose of Zyprexa .  We will reduce his midday dose of Ativan  to reduce his sedation.  We will not make any other changes to his medications at this time.  We will continue to monitor.    Psychosis  MDD  OCD  PTSD: -Increase Zyprexa  to 5 mg AM and 15 mg QHS for psychosis -Decrease Ativan  to 1 mg AM, 0.5 mg midday, and 1 mg QHS for Catatonia -Continue Agitation Protocol: Haldol /Ativan /Benadryl    Levy Leech Rating Scale Pre ativan  challenge: 13 Immobility 2 Mutism 3 Staring 3 Negativism 2 Withdrawal 2 Excitement 1   Post Ativan  Challenge: 4 Excitement 1 Immobility 1 Mutism 2   -Continue  Ensure 237 mL BID -Continue PRN's: Tylenol , Maalox, Atarax    --  The risks/benefits/side-effects/alternatives to medications were discussed in detail with the patient and time was given for questions. The patient consents to medication trials.                -- Metabolic profile and EKG monitoring obtained while on an atypical antipsychotic (BMI: 24.45 kg/m Lipid Panel: WNL except HDL: 30, LDL: 140, HbgA1c: 5.2 QTc: Sinus Tach (114) w/ Qtc: 424)              -- Encouraged patient to participate in unit milieu and in scheduled group therapies              -- Short Term Goals: Ability to identify changes in lifestyle to reduce recurrence of condition will improve, Ability to verbalize feelings will improve, Ability to disclose and discuss suicidal ideas, Ability to demonstrate self-control will improve, Ability to identify and develop effective coping behaviors will improve, Ability to maintain clinical measurements within normal limits will improve, Compliance with prescribed medications will improve, and Ability to identify triggers associated with substance abuse/mental health issues will improve             -- Long Term Goals: Improvement in symptoms so as ready for discharge   Safety and Monitoring:             -- Involuntary admission to inpatient psychiatric unit for safety, stabilization and treatment             -- Daily contact with  patient to assess and evaluate symptoms and progress in treatment             -- Patient's case to be discussed in multi-disciplinary team meeting             -- Observation Level : q15 minute checks             -- Vital signs:  q12 hours             -- Precautions: suicide, elopement, and assault  Discharge Planning:              -- Social work and case management to assist with discharge planning and identification of hospital follow-up needs prior to discharge             -- Estimated LOS: 3-5 more days             -- Discharge Concerns: Need to  establish a safety plan; Medication compliance and effectiveness             -- Discharge Goals: Return home with outpatient referrals for mental health follow-up including medication management/psychotherapy    Marsa GORMAN Rosser, DO 02/07/2024, 12:04 PM

## 2024-02-08 ENCOUNTER — Encounter (HOSPITAL_COMMUNITY): Payer: Self-pay

## 2024-02-08 MED ORDER — OLANZAPINE 10 MG PO TABS: 20.0000 mg | ORAL_TABLET | Freq: Every day | ORAL | Status: DC

## 2024-02-08 MED ORDER — LISDEXAMFETAMINE DIMESYLATE 10 MG PO CAPS
10.0000 mg | ORAL_CAPSULE | Freq: Every day | ORAL | Status: DC
  Administered 2024-02-08 – 2024-02-09 (×2): 10 mg via ORAL
  Filled 2024-02-08 (×2): qty 1

## 2024-02-08 MED ORDER — OLANZAPINE 5 MG PO TABS
15.0000 mg | ORAL_TABLET | Freq: Every day | ORAL | Status: AC
  Administered 2024-02-08: 15 mg via ORAL
  Filled 2024-02-08: qty 1

## 2024-02-08 NOTE — Plan of Care (Signed)

## 2024-02-08 NOTE — Progress Notes (Signed)
   02/08/24 1000  Psych Admission Type (Psych Patients Only)  Admission Status Involuntary  Psychosocial Assessment  Patient Complaints Anxiety;Suspiciousness  Eye Contact Fair  Facial Expression Anxious  Affect Preoccupied  Speech Logical/coherent  Interaction Guarded  Motor Activity Slow  Appearance/Hygiene Disheveled  Behavior Characteristics Guarded  Mood Preoccupied  Thought Process  Coherency Circumstantial  Content Preoccupation  Delusions None reported or observed  Perception WDL  Hallucination None reported or observed  Judgment Poor  Confusion None  Danger to Self  Current suicidal ideation? Denies  Agreement Not to Harm Self Yes  Description of Agreement verbal  Danger to Others  Danger to Others None reported or observed

## 2024-02-08 NOTE — BH IP Treatment Plan (Signed)
 Interdisciplinary Treatment and Diagnostic Plan Update  02/08/2024 Time of Session: UPDATE Benjamin [name] MRN: [id number]  Principal Diagnosis: Psychosis, unspecified psychosis type [facility])  Secondary Diagnoses: Principal Problem:   Psychosis, unspecified psychosis type (HCC) Active Problems:   PTSD (post-traumatic stress disorder)   OCD (obsessive compulsive disorder)   MDD (major depressive disorder), recurrent, with catatonic features (HCC)   Current Medications:  Current Facility-Administered Medications  Medication Dose Route Frequency Provider Last Rate Last Admin   acetaminophen  (TYLENOL ) tablet 650 mg  650 mg Oral Q6H PRN McCarty, Artie, MD       alum & mag hydroxide-simeth (MAALOX/MYLANTA) 200-200-20 MG/5ML suspension 30 mL  30 mL Oral Q4H PRN McCarty, Artie, MD       haloperidol  (HALDOL ) tablet 5 mg  5 mg Oral TID PRN McCarty, Artie, MD       And   diphenhydrAMINE  (BENADRYL ) capsule 50 mg  50 mg Oral TID PRN McCarty, Artie, MD       haloperidol  lactate (HALDOL ) injection 5 mg  5 mg Intramuscular TID PRN McCarty, Artie, MD   5 mg at 02/05/24 0421   And   diphenhydrAMINE  (BENADRYL ) injection 50 mg  50 mg Intramuscular TID PRN McCarty, Artie, MD   50 mg at 02/05/24 0421   And   LORazepam  (ATIVAN ) injection 2 mg  2 mg Intramuscular TID PRN McCarty, Artie, MD   2 mg at 02/05/24 9579   haloperidol  lactate (HALDOL ) injection 10 mg  10 mg Intramuscular TID PRN McCarty, Artie, MD       And   diphenhydrAMINE  (BENADRYL ) injection 50 mg  50 mg Intramuscular TID PRN McCarty, Artie, MD   50 mg at 02/03/24 1115   And   LORazepam  (ATIVAN ) injection 2 mg  2 mg Intramuscular TID PRN McCarty, Artie, MD   2 mg at 02/03/24 1114   feeding supplement (ENSURE PLUS HIGH PROTEIN) liquid 237 mL  237 mL Oral BID BM Lynnette Barter, MD   237 mL at 02/08/24 1453   hydrOXYzine  (ATARAX ) tablet 25 mg  25 mg Oral TID PRN Ajibola, Ene A, NP       lisdexamfetamine (VYVANSE ) capsule 10 mg  10 mg Oral Daily Ji,  Andrew, MD   10 mg at 02/08/24 1136   LORazepam  (ATIVAN ) tablet 0.5 mg  0.5 mg Oral Q1400 Pashayan, Alexander S, DO   0.5 mg at 02/08/24 1453   LORazepam  (ATIVAN ) tablet 1 mg  1 mg Oral BID Pashayan, Alexander S, DO   1 mg at 02/08/24 0845   OLANZapine  (ZYPREXA ) tablet 15 mg  15 mg Oral QHS Ji, Andrew, MD       Followed by   NOREEN ON 02/09/2024] OLANZapine  (ZYPREXA ) tablet 20 mg  20 mg Oral QHS Lynnette Barter, MD       PTA Medications: No medications prior to admission.    Patient Stressors:    Patient Strengths:    Treatment Modalities: Medication Management, Group therapy, Case management,  1 to 1 session with clinician, Psychoeducation, Recreational therapy.   Physician Treatment Plan for Primary Diagnosis: Psychosis, unspecified psychosis type (HCC) Long Term Goal(s):     Short Term Goals:    Medication Management: Evaluate patient's response, side effects, and tolerance of medication regimen.  Therapeutic Interventions: 1 to 1 sessions, Unit Group sessions and Medication administration.  Evaluation of Outcomes: Progressing  Physician Treatment Plan for Secondary Diagnosis: Principal Problem:   Psychosis, unspecified psychosis type (HCC) Active Problems:   PTSD (post-traumatic stress disorder)  OCD (obsessive compulsive disorder)   MDD (major depressive disorder), recurrent, with catatonic features (HCC)  Long Term Goal(s):     Short Term Goals:       Medication Management: Evaluate patient's response, side effects, and tolerance of medication regimen.  Therapeutic Interventions: 1 to 1 sessions, Unit Group sessions and Medication administration.  Evaluation of Outcomes: Progressing   RN Treatment Plan for Primary Diagnosis: Psychosis, unspecified psychosis type (HCC) Long Term Goal(s): Knowledge of disease and therapeutic regimen to maintain health will improve  Short Term Goals: Ability to demonstrate self-control, Ability to disclose and discuss suicidal ideas,  and Compliance with prescribed medications will improve  Medication Management: RN will administer medications as ordered by provider, will assess and evaluate patient's response and provide education to patient for prescribed medication. RN will report any adverse and/or side effects to prescribing provider.  Therapeutic Interventions: 1 on 1 counseling sessions, Psychoeducation, Medication administration, Evaluate responses to treatment, Monitor vital signs and CBGs as ordered, Perform/monitor CIWA, COWS, AIMS and Fall Risk screenings as ordered, Perform wound care treatments as ordered.  Evaluation of Outcomes: Progressing   LCSW Treatment Plan for Primary Diagnosis: Psychosis, unspecified psychosis type (HCC) Long Term Goal(s): Safe transition to appropriate next level of care at discharge, Engage patient in therapeutic group addressing interpersonal concerns.  Short Term Goals: Engage patient in aftercare planning with referrals and resources, Increase ability to appropriately verbalize feelings, Facilitate patient progression through stages of change regarding substance use diagnoses and concerns, and Identify triggers associated with mental health/substance abuse issues  Therapeutic Interventions: Assess for all discharge needs, 1 to 1 time with Social worker, Explore available resources and support systems, Assess for adequacy in community support network, Educate family and significant other(s) on suicide prevention, Complete Psychosocial Assessment, Interpersonal group therapy.  Evaluation of Outcomes: Progressing   Progress in Treatment: Attending groups: No. Participating in groups: No. Taking medication as prescribed: No. Toleration medication: No. Family/Significant other contact made: No, will contact: Jodie Pont (brother) Patient understands diagnosis: No. Discussing patient identified problems/goals with staff: No. Medical problems stabilized or resolved: Yes. Denies  suicidal/homicidal ideation: Yes. Issues/concerns per patient self-inventory: No.     New problem(s) identified: Yes, Describe:  selectively mute   New Short Term/Long Term Goal(s): medication stabilization, elimination of SI thoughts, development of comprehensive mental wellness plan.      Patient Goals:  Pt refused to speak. Pt wrote on paper I am here to complete my mission. I mean no harm   Discharge Plan or Barriers: Patient recently admitted. CSW will continue to follow and assess for appropriate referrals and possible discharge planning.      Reason for Continuation of Hospitalization: Delusions  Medication stabilization   Estimated Length of Stay: 5-7 days Last 3 [place] Suicide Severity Risk Score: Flowsheet Row [facility] (Current[facility] Hosp-Admission (Discharged) from 01/28/2024 in [facility] GENERAL MED[facility]SURG UNIT  C-SSRS RISK CATEGORY Error: Question 2 not populated No Risk    Last PHQ 2/9 Scores:     No data to display          Scribe for Treatment Team: Chudney Scheffler N Abrie Egloff, LCSW 02/08/2024 2:56 PM

## 2024-02-08 NOTE — Group Note (Signed)
 Recreation Therapy Group Note   Group Topic:Coping Skills  Group Date: 02/08/2024 Start Time: 1010 End Time: 1045 Facilitators: Aviyana Sonntag-McCall, LRT,CTRS Location: [address] Dayroom   Group Topic: Coping Skills   Goal Area(s) Addresses:  Patient will identify positive coping skills. Patient will identify benefits of using coping skills post d/c.   Behavioral Response:    Intervention: Worksheet, Brain storming   Activity: Mind Map. Patient was provided a blank template of a diagram with 32 blank boxes in a tiered system, branching from the center (similar to a bubble chart). LRT directed patients to label the middle of the diagram Coping Skills and consider 8 different situations that would require the use of coping skills. LRT and patients filled in the first 8 boxes together (anxiety, irritability, depression, aggravation, substance use, mean people, ignorant people and hiding feelings/speaking truth). Patients were to then come up with 3 positive coping skills to address each identified area in the remaining boxes stemming from a particular instance. LRT would then write the coping skills of patients on the board once patients were finished filling out their sheets.    Education: Pharmacologist, Building control surveyor.    Education Outcome:  Acknowledges Education   Affect/Mood: N/A   Participation Level: Did not attend    Clinical Observations/Individualized Feedback:     Plan: Continue to engage patient in RT group sessions 2-3x/week.   Lawsen Arnott-McCall, LRT,CTRS 02/08/2024 2:02 PM

## 2024-02-08 NOTE — Progress Notes (Signed)
 [facility] MD Progress Note  02/08/2024 12:13 PM Roe Wilner  MRN:  [id number]  Principal Problem: Psychosis, unspecified psychosis type (HCC) Diagnosis: Principal Problem:   Psychosis, unspecified psychosis type (HCC)   Reason for Admission:  Benjamin [name] is a 43 y.o. male initially admitted at [facility] for poor PO intake and later to [facility] due to concern for decompensated psychosis (hyperreligious delusion). He carries the psychiatric diagnoses of unspecified psychosis formally on Abilify  LAI, PTSD, major depressive disorder, recurrent, with catatonic features requiring ECT in 2018, with psychotic features, cannabis use disorder, OCD (cleaning/straightening/organizing) and has a past medical history of GI bleed.  (admitted on 02/02/2024, total  LOS: 6 days )   ASSESSMENT: Patient's significantly more verbal today suggesting catatonia resolving and not as guarded/paranoid. He continues to struggle with anxiety and feeling some degree of brain fog that he attributes to his ADHD. Given he normally is on Vyvanse  70 mg while with Dr. Tasia, we will start a very low dose of vyvanse  to evaluate for benefit and monitor for worsening catatonia or other symptoms. Anticipated discharge 7/9. Patient will call brother today toe ensure patient is at psychiatric baseline before likely discharge Wednesday.  PLAN: #Psychosis #Delusional Disorder #Catatonia #MDD #OCD #PTSD #History of ADHD -Consolidate zyprexa  to 20 mg at bedtime and switch to tablet rather than zydis  -cmp overall improved - Food and fluid log - Lorazepam  1 mg in AM and PM and 0.5 mg at noon - Consider mirtazapine if low p.o. intake as well as for depression -Restart Vyvanse  10 mg daily  Disposition Planning: -- Estimated LOS: 7 days --Estimated Discharge Date: 7/9 --Barriers to Discharge: catatonia, altered mental status, psychosis -- Discharge Goals: Return home with outpatient referrals for mental health follow-up including  medication management/psychotherapy  Subjective: The patient was seen and evaluated on the unit. On assessment today the patient was more verbal. Discussed discharge planning. Patient was able to appropriately respond and express concerns regarding feelings of brain fog. He has minimal memory of last week's incidents. He denies SI/HI/AVH. He reports eating and sleeping well. He feels anxious at this time but better than before. He would like to restart vyvanse  as he feels it would help with his inattention and ADHD symptoms. He will be calling brother today to talk with him as to determine whether he is back at baseline. He was amenable to me calling brother tomorrow to confirm.   Objective: Chart Review from last 24 hours:  The patient's chart was reviewed and nursing notes were reviewed. The patient's case was discussed in multidisciplinary team meeting.  - Overnight events to report per chart review / staff report: none - Patient took all prescribed medications - Patient received the following PRNs: none  Bush Francis Rating Scale Pre ativan  challenge: 13 Immobility 2 Mutism 3 Staring 3 Negativism 2 Withdrawal 2 Excitement 1  Post Ativan  Challenge: 4 Excitement 1 Immobility 1 Mutism 2    Current Medications: Current Facility-Administered Medications  Medication Dose Route Frequency Provider Last Rate Last Admin   acetaminophen  (TYLENOL ) tablet 650 mg  650 mg Oral Q6H PRN McCarty, Artie, MD       alum & mag hydroxide-simeth (MAALOX/MYLANTA) 200-200-20 MG/5ML suspension 30 mL  30 mL Oral Q4H PRN McCarty, Artie, MD       haloperidol  (HALDOL ) tablet 5 mg  5 mg Oral TID PRN McCarty, Artie, MD       And   diphenhydrAMINE  (BENADRYL ) capsule 50 mg  50 mg Oral TID PRN  Cornelius Dines, MD       haloperidol  lactate (HALDOL ) injection 5 mg  5 mg Intramuscular TID PRN McCarty, Artie, MD   5 mg at 02/05/24 0421   And   diphenhydrAMINE  (BENADRYL ) injection 50 mg  50 mg Intramuscular TID PRN  McCarty, Artie, MD   50 mg at 02/05/24 0421   And   LORazepam  (ATIVAN ) injection 2 mg  2 mg Intramuscular TID PRN McCarty, Artie, MD   2 mg at 02/05/24 9579   haloperidol  lactate (HALDOL ) injection 10 mg  10 mg Intramuscular TID PRN McCarty, Artie, MD       And   diphenhydrAMINE  (BENADRYL ) injection 50 mg  50 mg Intramuscular TID PRN McCarty, Artie, MD   50 mg at 02/03/24 1115   And   LORazepam  (ATIVAN ) injection 2 mg  2 mg Intramuscular TID PRN McCarty, Artie, MD   2 mg at 02/03/24 1114   feeding supplement (ENSURE PLUS HIGH PROTEIN) liquid 237 mL  237 mL Oral BID BM Lynnette Barter, MD   237 mL at 02/08/24 1041   hydrOXYzine  (ATARAX ) tablet 25 mg  25 mg Oral TID PRN Ajibola, Ene A, NP       lisdexamfetamine (VYVANSE ) capsule 10 mg  10 mg Oral Daily Koraima Albertsen, MD   10 mg at 02/08/24 1136   LORazepam  (ATIVAN ) tablet 0.5 mg  0.5 mg Oral Q1400 Pashayan, Alexander S, DO   0.5 mg at 02/07/24 1345   LORazepam  (ATIVAN ) tablet 1 mg  1 mg Oral BID Pashayan, Alexander S, DO   1 mg at 02/08/24 0845   OLANZapine  (ZYPREXA ) tablet 15 mg  15 mg Oral QHS Josiah Wojtaszek, MD       Followed by   NOREEN ON 02/09/2024] OLANZapine  (ZYPREXA ) tablet 20 mg  20 mg Oral QHS Lynnette Barter, MD        Lab Results:  No results found for this or any previous visit (from the past 48 hours).   Blood Alcohol level:  Lab Results  Component Value Date   [facility] <15 01/28/2024   ETH <10 10/27/2019    Metabolic Labs: Lab Results  Component Value Date   HGBA1C 5.4 02/04/2024   MPG 108 02/04/2024   MPG 103 07/15/2016   No results found for: PROLACTIN Lab Results  Component Value Date   CHOL 153 02/04/2024   TRIG 155 (H) 02/04/2024   HDL 29 (L) 02/04/2024   CHOLHDL 5.3 02/04/2024   VLDL 31 02/04/2024   LDLCALC 93 02/04/2024   LDLCALC 140 (H) 07/15/2016    Physical Findings: AIMS: No  CIWA:    COWS:     Psychiatric Specialty Exam: General Appearance: casual  Eye Contact: fair  Speech: normal  Volume: normal  Mood:  anxious  Affect: appropriate  Thought Content: no SI/HI. Focused on discharge planning  Suicidal Thoughts: denies  Homicidal Thoughts: denies  Thought Process: coherent goal directed, linear  Orientation: axox4    Memory: fair  Judgment: fair  Insight: fair  Concentration: fair  Recall: fair  Fund of Knowledge: fair  Language: fair  Psychomotor Activity: psychomotor retardation  Assets: sense of family, physical health, resilience     Review of Systems ROS  Vital Signs: Blood pressure 111/80, pulse 77, temperature 97.7 F (36.5 C), temperature source Oral, resp. rate 14, height 5' 10 (1.778 m), weight 77.3 kg, SpO2 99%. Body mass index is 24.45 kg/m. Physical Exam  I certify that inpatient services furnished can reasonably be expected to improve  the patient's condition.   Signed: Prentice Espy, MD 02/08/2024, 12:13 PM

## 2024-02-08 NOTE — Plan of Care (Signed)

## 2024-02-08 NOTE — Progress Notes (Signed)
   02/08/24 1640  Spiritual Encounters  Type of Visit Initial  Care provided to: Patient  Referral source Chaplain assessment  Spiritual Framework  Presenting Themes Meaning/purpose/sources of inspiration   While on unit, I met brieflu with Mr. Benjamin [name].  Benjamin [name] expressed feeling improved since coming to [facility] and was glad to relocate to current unit. He described his [place] faith as a source of meaning and strength.  I provided compassionate presence and relational support. I celebrated Benjamin [name]'s improved feeling and affirmed his faith. I explored with him what held meaning for him and how he viewed the role of faith in his health and healing.  Will follow when I return on 7/9. Chaplain Rockie is available 7/8 for ongoing support.  Airelle Everding L. Fredrica, M.Div [phone]

## 2024-02-08 NOTE — BH Assessment (Signed)
 Patient did not want to speak much. He did come up to me at beginning of shift and ask if he could have his night meds. Pt was given his 2100 medication and encouraged to attend group and come back after group for the rest of his night meds. Pt agreed. Pt did not have snack after group. He had 1 carton of milk only. Denies any SI/HI and AVH, 15 min checks maintained.

## 2024-02-08 NOTE — [facility] Suicide Risk Assessment (Signed)
 [facility] INPATIENT:  Family/Significant Other Suicide Prevention Education  Suicide Prevention Education:  Education Completed; Benjamin [name] (brother) [phone],  (name of family member/significant other) has been identified by the patient as the family member/significant other with whom the patient will be residing, and identified as the person(s) who will aid the patient in the event of a mental health crisis (suicidal ideations/suicide attempt).  With written consent from the patient, the family member/significant other has been provided the following suicide prevention education, prior to the and/or following the discharge of the patient.  Brother said that patient doesn't have any guns or weapons.  He doesn't have any safety concerns about patient returning home.  He said patient has 2 children, who stay with patient's brother while patient is in the hospital.  He said that the children will not return immediately to patient upon discharge, but will stay with brother for a little while in the summer, until he is doing all right.  Brother said that the children have been staying with patient, there are no other people in the home.  Patient is a good parent, the children go to school and participate in activities.  The children  miss him and want to talk to him on the phone.  Brother said he doesn't have any safety concerns about the children returning to his home.  Brother said that the children's mother is addicted to drugs, and doesn't have custody of the children.    Brother felt that he experienced symptoms due to stress.  Patient's and brother's mother is in hospice (3 minutes from patient's home).  Brother said that patient experienced similar episode about 10 years ago (in 2016).  Brother said that patient was able to maintain his responsibilities:  go to work and take care of his children.  Brother said these were the only 2 episodes (10 years ago and now).  Brother said he lives in  [place] , nor far from [place].  The suicide prevention education provided includes the following: Suicide risk factors Suicide prevention and interventions National Suicide Hotline telephone number [facility] assessment telephone number [facility] Emergency Assistance 911 [facility] and/or Residential Mobile Crisis Unit telephone number  Request made of family/significant other to: Remove weapons (e.g., guns, rifles, knives), all items previously/currently identified as safety concern.    The family member/significant other verbalizes understanding of the suicide prevention education information provided.  The family member/significant other agrees to remove the items of safety concern listed above.  Benjamin [name] O Benjamin [name], LCSWA 02/08/2024, 4:58 PM

## 2024-02-09 MED ORDER — OLANZAPINE 10 MG PO TABS
20.0000 mg | ORAL_TABLET | Freq: Every day | ORAL | Status: DC
  Administered 2024-02-09: 20 mg via ORAL
  Filled 2024-02-09: qty 14
  Filled 2024-02-09: qty 2

## 2024-02-09 MED ORDER — LORAZEPAM 0.5 MG PO TABS
0.5000 mg | ORAL_TABLET | Freq: Every day | ORAL | Status: AC
  Administered 2024-02-09: 0.5 mg via ORAL
  Filled 2024-02-09: qty 1

## 2024-02-09 MED ORDER — CLONAZEPAM 0.5 MG PO TABS
0.5000 mg | ORAL_TABLET | Freq: Two times a day (BID) | ORAL | Status: DC
  Administered 2024-02-09 – 2024-02-10 (×2): 0.5 mg via ORAL
  Filled 2024-02-09 (×2): qty 1

## 2024-02-09 MED ORDER — OLANZAPINE 5 MG PO TABS: 15.0000 mg | ORAL_TABLET | Freq: Every day | ORAL | Status: DC

## 2024-02-09 MED ORDER — LISDEXAMFETAMINE DIMESYLATE 30 MG PO CAPS
30.0000 mg | ORAL_CAPSULE | Freq: Every day | ORAL | Status: AC
  Administered 2024-02-09 – 2024-02-10 (×2): 30 mg via ORAL
  Filled 2024-02-09 (×2): qty 1

## 2024-02-09 NOTE — Group Note (Signed)
 Date:  02/09/2024 Time:  9:54 AM  Group Topic/Focus:  Goals Group:   The focus of this group is to help patients establish daily goals to achieve during treatment and discuss how the patient can incorporate goal setting into their daily lives to aide in recovery. Orientation:   The focus of this group is to educate the patient on the purpose and policies of crisis stabilization and provide a format to answer questions about their admission.  The group details unit policies and expectations of patients while admitted.    Participation Level:  Did Not Attend

## 2024-02-09 NOTE — Group Note (Signed)
 LCSW Group Therapy Note   Group Date: 02/09/2024 Start Time: 1100 End Time: 1200   Participation: patient was present and actively participated in the discussion.  Type of Therapy:  Group Therapy  Topic:  Finding Balance: Using Wise Mind for Thoughtful Decisions  Objective:  the objective of this class is to help participants understand the concept of Wise Mind and learn how to apply it to real-life situations to make balanced, thoughtful decisions. Participants will gain tools to manage emotions, consider logic, and find a middle ground that leads to healthier responses and outcomes.  Goals: Understand the concept of Wise Mind.  Participants will learn the difference between Emotional Mind, Reasonable Mind, and Delsie Mind, and how Delsie Mind helps in balancing emotions and logic to make thoughtful decisions. Recognize when you're in Emotional Mind or Reasonable Mind.  Participants will identify the signs of Emotional Mind and Reasonable Mind in their own reactions to situations and understand how to move into Wise Mind for more balanced responses. Practice applying Delsie Mind to real-life situations.  Through scenarios and group activities, participants will practice using Wise Mind in everyday situations, learning how to acknowledge their emotions, think logically, and create solutions that are thoughtful and balanced.  Summary:  In this class, we explored the concept of Wise Mind - the balance between Emotional Mind and Reasonable Mind. We discussed how Emotional Mind can sometimes lead to impulsive, reactive decisions driven by intense feelings, and how Reasonable Mind might ignore feelings altogether, focusing only on facts and logic. Delsie Mind is the middle ground that combines both, allowing you to consider your emotions and use logic to make balanced, thoughtful decisions.  We learned how to recognize when we're in Emotional Mind or Reasonable Mind, and practiced using Wise Mind in real-life  situations. By using Delsie Smiles, we can improve how we handle challenging situations, make better decisions, and strengthen our relationships with others.   Ethon Wymer O Greig Altergott, LCSWA 02/09/2024  4:09 PM

## 2024-02-09 NOTE — Plan of Care (Signed)
  Problem: Education: Goal: Mental status will improve Outcome: Progressing Goal: Verbalization of understanding the information provided will improve Outcome: Progressing   Problem: Activity: Goal: Sleeping patterns will improve Outcome: Progressing

## 2024-02-09 NOTE — Group Note (Signed)
 Date:  02/09/2024 Time:  9:57 PM  Group Topic/Focus:  Wrap-Up Group:   The focus of this group is to help patients review their daily goal of treatment and discuss progress on daily workbooks.    Additional Comments:  Pt was encouraged, but opted out of attending wrap up group this evening.   Benjamin [name] 02/09/2024, 9:57 PM

## 2024-02-09 NOTE — Progress Notes (Addendum)
 [facility] MD Progress Note  02/09/2024 1:27 PM Benjamin [name]  MRN:  [id number]  Principal Problem: MDD (major depressive disorder), recurrent, with catatonic features (HCC) Diagnosis: Principal Problem:   MDD (major depressive disorder), recurrent, with catatonic features (HCC) Active Problems:   PTSD (post-traumatic stress disorder)   OCD (obsessive compulsive disorder)   Psychosis, unspecified psychosis type (HCC)   Reason for Admission:  Benjamin [name] is a 43 y.o. male initially admitted at [facility] for poor PO intake and later to [facility] due to concern for decompensated psychosis (hyperreligious delusion). He carries the psychiatric diagnoses of unspecified psychosis formally on Abilify  LAI, PTSD, major depressive disorder, recurrent, with catatonic features requiring ECT in 2018, with psychotic features, cannabis use disorder, OCD (cleaning/straightening/organizing) and has a past medical history of GI bleed.  (admitted on 02/02/2024, total  LOS: 7 days )   ASSESSMENT: Patient's significantly more verbal and coherent today. Continue with plan to discharge tomorrow. Attempted to call brother to ensure patient is at psychiatric baseline but call went to VM. Per LCSW note, brother has no safety concerns about patient returning home.  PLAN: #Psychosis #Delusional Disorder #Catatonia #MDD #OCD #PTSD #History of ADHD -Continue zyprexa  20 mg at bedtime  -cmp overall improved - Food and fluid log - Switch lorazepam  to clonazepam  0.5 mg bid -Increase Vyvanse  to 30 mg daily. Patient will need to have OP provider send prescription of vyvanse  to continue  Disposition Planning: -- Estimated LOS: 7 days --Estimated Discharge Date: 7/9 --Barriers to Discharge: catatonia, altered mental status, psychosis -- Discharge Goals: Return home with outpatient referrals for mental health follow-up including medication management/psychotherapy  Subjective: The patient was seen and evaluated on the unit.  On assessment today the patient was more verbal and coherent.  Does not experience any signs of further catatonia.  Patient has been eating and sleeping better than before.  Patient has no acute questions at this time and is amenable to discharging tomorrow.  He will be living by himself for this week but then will go pick up his 2 children at the end of the week. Denies SI/HI/AVH.  Objective: Chart Review from last 24 hours:  The patient's chart was reviewed and nursing notes were reviewed. The patient's case was discussed in multidisciplinary team meeting.  - Overnight events to report per chart review / staff report: none - Patient took all prescribed medications - Patient received the following PRNs: none  Bush Francis Rating Scale Pre ativan  challenge: 13 Immobility 2 Mutism 3 Staring 3 Negativism 2 Withdrawal 2 Excitement 1  Post Ativan  Challenge: 4 Excitement 1 Immobility 1 Mutism 2    Current Medications: Current Facility-Administered Medications  Medication Dose Route Frequency Provider Last Rate Last Admin   acetaminophen  (TYLENOL ) tablet 650 mg  650 mg Oral Q6H PRN McCarty, Artie, MD       alum & mag hydroxide-simeth (MAALOX/MYLANTA) 200-200-20 MG/5ML suspension 30 mL  30 mL Oral Q4H PRN McCarty, Artie, MD       clonazePAM  (KLONOPIN ) tablet 0.5 mg  0.5 mg Oral Q12H Lynnette Barter, MD       haloperidol  (HALDOL ) tablet 5 mg  5 mg Oral TID PRN McCarty, Artie, MD       And   diphenhydrAMINE  (BENADRYL ) capsule 50 mg  50 mg Oral TID PRN McCarty, Artie, MD       haloperidol  lactate (HALDOL ) injection 5 mg  5 mg Intramuscular TID PRN McCarty, Artie, MD   5 mg at 02/05/24 0421   And  diphenhydrAMINE  (BENADRYL ) injection 50 mg  50 mg Intramuscular TID PRN McCarty, Artie, MD   50 mg at 02/05/24 0421   And   LORazepam  (ATIVAN ) injection 2 mg  2 mg Intramuscular TID PRN McCarty, Artie, MD   2 mg at 02/05/24 0420   haloperidol  lactate (HALDOL ) injection 10 mg  10 mg Intramuscular TID  PRN McCarty, Artie, MD       And   diphenhydrAMINE  (BENADRYL ) injection 50 mg  50 mg Intramuscular TID PRN McCarty, Artie, MD   50 mg at 02/03/24 1115   And   LORazepam  (ATIVAN ) injection 2 mg  2 mg Intramuscular TID PRN McCarty, Artie, MD   2 mg at 02/03/24 1114   feeding supplement (ENSURE PLUS HIGH PROTEIN) liquid 237 mL  237 mL Oral BID BM Lynnette Barter, MD   237 mL at 02/09/24 9056   hydrOXYzine  (ATARAX ) tablet 25 mg  25 mg Oral TID PRN Ajibola, Ene A, NP       lisdexamfetamine (VYVANSE ) capsule 30 mg  30 mg Oral Daily Robben Jagiello, MD   30 mg at 02/09/24 1117   LORazepam  (ATIVAN ) tablet 0.5 mg  0.5 mg Oral Q1400 Lynnette Barter, MD       OLANZapine  (ZYPREXA ) tablet 15 mg  15 mg Oral QHS Kennyth Starleen RAMAN, MD        Lab Results:  No results found for this or any previous visit (from the past 48 hours).   Blood Alcohol level:  Lab Results  Component Value Date   [facility] <15 01/28/2024   ETH <10 10/27/2019    Metabolic Labs: Lab Results  Component Value Date   HGBA1C 5.4 02/04/2024   MPG 108 02/04/2024   MPG 103 07/15/2016   No results found for: PROLACTIN Lab Results  Component Value Date   CHOL 153 02/04/2024   TRIG 155 (H) 02/04/2024   HDL 29 (L) 02/04/2024   CHOLHDL 5.3 02/04/2024   VLDL 31 02/04/2024   LDLCALC 93 02/04/2024   LDLCALC 140 (H) 07/15/2016    Physical Findings: AIMS: No  CIWA:    COWS:     Psychiatric Specialty Exam: General Appearance: casual  Eye Contact: fair  Speech: normal  Volume: normal  Mood: anxious  Affect: appropriate  Thought Content: no SI/HI. Focused on discharge planning  Suicidal Thoughts: denies  Homicidal Thoughts: denies  Thought Process: coherent goal directed, linear  Orientation: axox4    Memory: fair  Judgment: fair  Insight: fair  Concentration: fair  Recall: fair  Fund of Knowledge: fair  Language: fair  Psychomotor Activity: psychomotor retardation  Assets: sense of family, physical health, resilience      Review of Systems ROS  Vital Signs: Blood pressure 113/77, pulse 84, temperature 97.6 F (36.4 C), temperature source Oral, resp. rate 16, height 5' 10 (1.778 m), weight 77.3 kg, SpO2 98%. Body mass index is 24.45 kg/m. Physical Exam  I certify that inpatient services furnished can reasonably be expected to improve the patient's condition.   Signed: Barter Lynnette, MD 02/09/2024, 1:27 PM

## 2024-02-09 NOTE — Group Note (Signed)
 Recreation Therapy Group Note   Group Topic:Animal Assisted Therapy   Group Date: 02/09/2024 Start Time: 9051 End Time: 1030 Facilitators: Antonius Hartlage-McCall, LRT,CTRS Location: [address] Dayroom   Animal-Assisted Activity (AAA) Program Checklist/Progress Notes Patient Eligibility Criteria Checklist & Daily Group note for Rec Tx Intervention  AAA/T Program Assumption of Risk Form signed by Patient/ or Parent Legal Guardian Yes  Patient understands his/her participation is voluntary Yes  Behavioral Response:    Education: Charity fundraiser, Appropriate Animal Interaction   Education Outcome: Acknowledges education.    Affect/Mood: N/A   Participation Level: Did not attend    Clinical Observations/Individualized Feedback:    Plan: Continue to engage patient in RT group sessions 2-3x/week.   Lanea Vankirk-McCall, LRT,CTRS 02/09/2024 12:37 PM

## 2024-02-09 NOTE — Progress Notes (Signed)
   02/09/24 2000  Psych Admission Type (Psych Patients Only)  Admission Status Involuntary  Psychosocial Assessment  Patient Complaints None  Eye Contact Fair  Facial Expression Anxious;Animated  Affect Preoccupied  Speech Logical/coherent  Interaction Minimal;Guarded  Motor Activity Slow  Appearance/Hygiene Unremarkable  Behavior Characteristics Guarded  Mood Pleasant;Preoccupied  Thought Process  Coherency Circumstantial  Content Religiosity;Preoccupation  Delusions None reported or observed  Perception WDL  Hallucination None reported or observed  Judgment Poor  Confusion None  Danger to Self  Current suicidal ideation? Denies  Agreement Not to Harm Self Yes  Description of Agreement Verbal  Danger to Others  Danger to Others None reported or observed

## 2024-02-10 LAB — RPR: RPR Ser Ql: NONREACTIVE

## 2024-02-10 LAB — VITAMIN D 25 HYDROXY (VIT D DEFICIENCY, FRACTURES): Vit D, 25-Hydroxy: 26.16 ng/mL — ABNORMAL LOW (ref 30–100)

## 2024-02-10 MED ORDER — CLONAZEPAM 0.5 MG PO TABS: 0.5000 mg | ORAL_TABLET | Freq: Two times a day (BID) | ORAL | 0 refills | Status: DC

## 2024-02-10 MED ORDER — OLANZAPINE 20 MG PO TABS: 20.0000 mg | ORAL_TABLET | Freq: Every day | ORAL | 0 refills | Status: DC

## 2024-02-10 MED ORDER — LISDEXAMFETAMINE DIMESYLATE 70 MG PO CAPS: 70.0000 mg | ORAL_CAPSULE | Freq: Every day | ORAL | Status: DC

## 2024-02-10 NOTE — Progress Notes (Signed)
 Patient is discharging at this time. Patient is A&Ox4. Stable. Patient denies SI,HI, and A/V/H with no plan/intent. Printed AVS reviewed with and given to patient along with medications and follow up appointments. Patient refused to complete suicide safety plan and patient survey. Patient verbalized all understanding. No valuables/belongings. Patient is being transported by taxi. Patient denies any pain/discomfort. No s/s of current distress.

## 2024-02-10 NOTE — [facility] Suicide Risk Assessment (Signed)
 [facility] Discharge Suicide Risk Assessment   Principal Problem: MDD (major depressive disorder), recurrent, with catatonic features [facility]) Discharge Diagnoses: Principal Problem:   MDD (major depressive disorder), recurrent, with catatonic features (HCC) Active Problems:   PTSD (post-traumatic stress disorder)   OCD (obsessive compulsive disorder)   Psychosis, unspecified psychosis type (HCC)   Reason for Admission: Benjamin [name] is a 43 y.o. male initially admitted at [facility] for poor PO intake and later to [facility] due to concern for decompensated psychosis (hyperreligious delusion). He carries the psychiatric diagnoses of unspecified psychosis formally on [employer]  LAI, PTSD, major depressive disorder, recurrent, with catatonic features requiring ECT in 2018, with psychotic features, cannabis use disorder, OCD (cleaning/straightening/organizing) and has a past medical history of GI bleed.   Hospital Course  During the patient's hospitalization, patient had extensive initial psychiatric evaluation, and follow-up psychiatric evaluations every day.  Psychiatric diagnoses provided upon initial assessment:  #MDD, w/ catatonic features #History of OCD #PTSD #History of ADHD  Patient's psychiatric medications were adjusted on admission:  - Start olanzapine  Zydis 10 mg nightly   During the hospitalization, other adjustments were made to the patient's psychiatric medication regimen:  - Started scheduled ativan  due to [facility] 13 which improved down to 4 after ativan  challenge - Ativan  converted to clonazepam  to reduce medication burden - Restarted patient's vyvanse  at half dose - Increase zyprexa  to 20 mg  Patient's care was discussed during the interdisciplinary team meeting every day during the hospitalization.  The patient denies having side effects to prescribed psychiatric medication.  Gradually, patient started adjusting to milieu. The patient was evaluated each day by a clinical provider to  ascertain response to treatment. Improvement was noted by the patient's report of decreasing symptoms, improved sleep and appetite, affect, medication tolerance, behavior, and participation in unit programming.  Patient was asked each day to complete a self inventory noting mood, mental status, pain, new symptoms, anxiety and concerns.    Symptoms were reported as significantly decreased or resolved completely by discharge.   On day of discharge, the patient reports that their mood is stable. The patient denied having suicidal thoughts for more than 48 hours prior to discharge.  Patient denies having homicidal thoughts.  Patient denies having auditory hallucinations.  Patient denies any visual hallucinations or other symptoms of psychosis. The patient was motivated to continue taking medication with a goal of continued improvement in mental health.   The patient reports their target psychiatric symptoms of catatonia and MDD responded well to the psychiatric medications, and the patient reports overall benefit other psychiatric hospitalization. Supportive psychotherapy was provided to the patient. The patient also participated in regular group therapy while hospitalized. Coping skills, problem solving as well as relaxation therapies were also part of the unit programming.  Labs were reviewed with the patient, and abnormal results were discussed with the patient.  The patient is able to verbalize their individual safety plan to this provider.  # It is recommended to the patient to continue psychiatric medications as prescribed, after discharge from the hospital.    # It is recommended to the patient to follow up with your outpatient psychiatric provider and PCP.  # It was discussed with the patient, the impact of alcohol, drugs, tobacco have been there overall psychiatric and medical wellbeing, and total abstinence from substance use was recommended the patient.ed.  # Prescriptions provided or sent  directly to preferred pharmacy at discharge. Patient agreeable to plan. Given opportunity to ask questions. Appears to feel comfortable  with discharge.    # In the event of worsening symptoms, the patient is instructed to call the crisis hotline, 911 and or go to the nearest ED for appropriate evaluation and treatment of symptoms. To follow-up with primary care provider for other medical issues, concerns and or health care needs  # Patient was discharged home with a plan to follow up as noted below.   Total Time spent with patient: 1 hour  Musculoskeletal: Strength & Muscle Tone: within normal limits Gait & Station: normal Patient leans: N/A  Psychiatric Specialty Exam  Presentation  General Appearance: Appropriate for Environment; Casual   Eye Contact:Good   Speech:Clear and Coherent; Normal Rate   Speech Volume:Normal   Handedness:Right    Mood and Affect  Mood:Euthymic   Duration of Depression Symptoms: No data recorded  Affect:Appropriate; Congruent    Thought Process  Thought Processes:Coherent; Goal Directed; Linear   Descriptions of Associations:Intact   Orientation:Full (Time, Place and Person)   Thought Content:Logical   History of Schizophrenia/Schizoaffective disorder:No   Duration of Psychotic Symptoms:Greater than six months   Hallucinations:Hallucinations: None  Ideas of Reference:None   Suicidal Thoughts:Suicidal Thoughts: No  Homicidal Thoughts:Homicidal Thoughts: No   Sensorium  Memory:Immediate Good; Recent Good; Remote Good   Judgment:Fair   Insight:Fair    Executive Functions  Concentration:Good   Attention Span:Good   Recall:Good   Fund of Knowledge:Good   Language:Good    Psychomotor Activity  Psychomotor Activity:Psychomotor Activity: Normal   Assets  Assets:Communication Skills; Desire for Improvement; Physical Health    Sleep  Sleep:Sleep: Good   Physical Exam: Physical  Exam ROS Blood pressure 112/78, pulse 80, temperature 98.1 F (36.7 C), temperature source Oral, resp. rate 16, height 5' 10 (1.778 m), weight 77.3 kg, SpO2 98%. Body mass index is 24.45 kg/m.  Mental Status Per Nursing Assessment::   On Admission:  NA  Demographic Factors:  Male, Caucasian, and Living alone  Loss Factors: Loss of significant relationship  Historical Factors: NA  Risk Reduction Factors:   Responsible for children under 69 years of age, Sense of responsibility to family, Employed, Positive social support, Positive therapeutic relationship, and Positive coping skills or problem solving skills  Continued Clinical Symptoms:  Depression:   Severe More than one psychiatric diagnosis Previous Psychiatric Diagnoses and Treatments Medical Diagnoses and Treatments/Surgeries  Cognitive Features That Contribute To Risk:  None    Suicide Risk:  Minimal: No identifiable suicidal ideation.  Patients presenting with no risk factors but with morbid ruminations; may be classified as minimal risk based on the severity of the depressive symptoms   Follow-up Information     Tasia Lung, MD. Go on 02/18/2024.   Specialty: Psychiatry Why: You have an appointment for medication management services on 02/18/24 at 8:30 am, in person.  You also have an appointment for therapy services on 03/04/24 at 1:00 pm, in person. Contact information: [address] [place] [address] [phone]                 Plan Of Care/Follow-up recommendations:  Activity: as tolerated  Diet: heart healthy  Other: -Follow-up with your outpatient psychiatric provider -instructions on appointment date, time, and address (location) are provided to you in discharge paperwork.  -Take your psychiatric medications as prescribed at discharge - instructions are provided to you in the discharge paperwork  -Follow-up with outpatient primary care doctor and other specialists -for  management of chronic medical disease, including:   -Testing: Follow-up with outpatient  provider for abnormal lab results: none  -Recommend abstinence from alcohol, tobacco, and other illicit drug use at discharge.   -If your psychiatric symptoms recur, worsen, or if you have side effects to your psychiatric medications, call your outpatient psychiatric provider, 911, [phone] or go to the nearest emergency department.  -If suicidal thoughts recur, call your outpatient psychiatric provider, 911, [phone] or go to the nearest emergency department.   Prentice Espy, MD 02/10/2024, 7:46 AM

## 2024-02-10 NOTE — [facility] Suicide Risk Assessment (Signed)
 [facility] INPATIENT:  Family/Significant Other Suicide Prevention Education  Suicide Prevention Education:  Education Completed; with patient,  (name of family member/significant other) has been identified by the patient as the family member/significant other with whom the patient will be residing, and identified as the person(s) who will aid the patient in the event of a mental health crisis (suicidal ideations/suicide attempt).  With written consent from the patient, the family member/significant other has been provided the following suicide prevention education, prior to the and/or following the discharge of the patient.  Patient was given Suicide Prevention Information brochure and resources.  The suicide prevention education provided includes the following: Suicide risk factors Suicide prevention and interventions National Suicide Hotline telephone number [facility] assessment telephone number [facility] Emergency Assistance 911 [facility] and/or Residential Mobile Crisis Unit telephone number  Request made of family/significant other to: Remove weapons (e.g., guns, rifles, knives), all items previously/currently identified as safety concern.   Remove drugs/medications (over-the-counter, prescriptions, illicit drugs), all items previously/currently identified as a safety concern.  The family member/significant other verbalizes understanding of the suicide prevention education information provided.  The family member/significant other agrees to remove the items of safety concern listed above.  Mortimer Bair O Nyal Schachter, LCSWA 02/10/2024, 9:27 AM

## 2024-02-10 NOTE — Plan of Care (Signed)
   Problem: Education: Goal: Emotional status will improve Outcome: Not Progressing Goal: Mental status will improve Outcome: Not Progressing Goal: Verbalization of understanding the information provided will improve Outcome: Not Progressing

## 2024-02-10 NOTE — Progress Notes (Signed)
   02/10/24 0515  15 Minute Checks  Location Bedroom  Visual Appearance Calm  Behavior Sleeping  Sleep (Behavioral Health Patients Only)  Calculate sleep? (Click Yes once per 24 hr at 0600 safety check) Yes  Documented sleep last 24 hours 6.5

## 2024-02-10 NOTE — Transportation (Signed)
 02/10/2024  Benjamin [name] DOB: [birth year] MRN: [id number]   RIDER WAIVER AND RELEASE OF LIABILITY  For the purposes of helping with transportation needs, [facility] partners with outside transportation providers (taxi companies, [place], Catering manager.) to give [place] patients or other approved people the choice of on-demand rides Public librarian) to our buildings for non-emergency visits.  By using [employer], I, the person signing this document, on behalf of myself and/or any legal minors (in my care using the [employer]), agree:  Science writer given to me are supplied by independent, outside transportation providers who do not work for, or have any affiliation with, [employer]. [place] is not a transportation company. [facility] has no control over the quality or safety of the rides I get using [employer]. [facility] has no control over whether any outside ride will happen on time or not. [employer] gives no guarantee on the reliability, quality, safety, or availability on any rides, or that no mistakes will happen. I know and accept that traveling by vehicle (car, truck, SVU, fleeta, bus, taxi, etc.) has risks of serious injuries such as disability, being paralyzed, and death. I know and agree the risk of using [employer] is mine alone, and not [employer]. [employer] are provided as is and as are available. The transportation providers are in charge for all inspections and care of the vehicles used to provide these rides. I agree not to take legal action against [employer], its agents, employees, officers, directors, representatives, insurers, attorneys, assigns, successors, subsidiaries, and affiliates at any time for any reasons related directly or indirectly to using [employer]. I also agree not to take legal action against [employer] or its affiliates for any injury, death, or damage to property caused by or related to using  [employer]. I have read this Waiver and Release of Liability, and I understand the terms used in it and their legal meaning. This Waiver is freely and voluntarily given with the understanding that my right (or any legal minors) to legal action against [place] relating to [employer] is knowingly given up to use these services.   I attest that I read the Ride Waiver and Release of Liability to Benjamin [name], gave Mr. Grunwald the opportunity to ask questions and answered the questions asked (if any). I affirm that [name] then provided consent for assistance with transportation.     Dede Dobesh, LCSWA 02/10/2024

## 2024-02-10 NOTE — Progress Notes (Signed)
  [facility] Adult Case Management Discharge Plan :  Will you be returning to the same living situation after discharge:  yes, patient will return home At discharge, do you have transportation home?: Yes,  CSW arranged transportation through [facility] for 10:00 AM Do you have the ability to pay for your medications: Yes,  patient has insurance  Release of information consent forms completed and in the chart;  Patient's signature needed at discharge.  Patient to Follow up at:  Follow-up Information     Tasia Lung, MD. Go on 02/18/2024.   Specialty: Psychiatry Why: You have an appointment for medication management services on 02/18/24 at 8:30 am, in person.  You also have an appointment for therapy services on 03/04/24 at 1:00 pm, in person. Contact information: [address] [place] [address] [phone]                 Next level of care provider has access to [facility] Link:no  Safety Planning and Suicide Prevention discussed: Yes,  Jasan Doughtie (brother) [phone] and patient  Has patient been referred to the Quitline?: Patient does not use tobacco/nicotine  products.  Patient said that he quit smoking over a year ago.  Patient has been referred for addiction treatment: No known substance use disorder.    Dinora Hemm O Zamariah Seaborn, LCSWA 02/10/2024, 9:19 AM

## 2024-02-10 NOTE — Discharge Summary (Signed)
 Physician Discharge Summary Note Patient:  Benjamin [name] is an 43 y.o., male MRN:  [id number] DOB:  11/10/1980 Patient phone:  [phone] (home)  Patient address:   [address] [place] [address],  Total Time spent with patient: 1 hour  Date of Admission:  02/02/2024 Date of Discharge: 02/10/24  Reason for Admission: Benjamin [name] is a 43 y.o. male initially admitted at [facility] for poor PO intake and later to [facility] due to concern for decompensated psychosis (hyperreligious delusion). He carries the psychiatric diagnoses of unspecified psychosis formally on Abilify  LAI, PTSD, major depressive disorder, recurrent, with catatonic features requiring ECT in 2018, with psychotic features, cannabis use disorder, OCD (cleaning/straightening/organizing) and has a past medical history of GI bleed.   Hospital Course  During the patient's hospitalization, patient had extensive initial psychiatric evaluation, and follow-up psychiatric evaluations every day.  Psychiatric diagnoses provided upon initial assessment:  #MDD, w/ catatonic features #History of OCD #PTSD #History of ADHD  Patient's psychiatric medications were adjusted on admission:  - Start olanzapine  Zydis 10 mg nightly   During the hospitalization, other adjustments were made to the patient's psychiatric medication regimen:  - Started scheduled ativan  due to BFRS 13 which improved down to 4 after ativan  challenge - Ativan  converted to clonazepam  to reduce medication burden - Restarted patient's vyvanse  at half dose - Increase zyprexa  to 20 mg  Patient's care was discussed during the interdisciplinary team meeting every day during the hospitalization.  The patient denies having side effects to prescribed psychiatric medication.  Gradually, patient started adjusting to milieu. The patient was evaluated each day by a clinical provider to ascertain response to treatment. Improvement was noted by the patient's report of  decreasing symptoms, improved sleep and appetite, affect, medication tolerance, behavior, and participation in unit programming.  Patient was asked each day to complete a self inventory noting mood, mental status, pain, new symptoms, anxiety and concerns.    Symptoms were reported as significantly decreased or resolved completely by discharge.   On day of discharge, the patient reports that their mood is stable. The patient denied having suicidal thoughts for more than 48 hours prior to discharge.  Patient denies having homicidal thoughts.  Patient denies having auditory hallucinations.  Patient denies any visual hallucinations or other symptoms of psychosis. The patient was motivated to continue taking medication with a goal of continued improvement in mental health.   The patient reports their target psychiatric symptoms of catatonia and MDD responded well to the psychiatric medications, and the patient reports overall benefit other psychiatric hospitalization. Supportive psychotherapy was provided to the patient. The patient also participated in regular group therapy while hospitalized. Coping skills, problem solving as well as relaxation therapies were also part of the unit programming.  Labs were reviewed with the patient, and abnormal results were discussed with the patient.  The patient is able to verbalize their individual safety plan to this provider.  # It is recommended to the patient to continue psychiatric medications as prescribed, after discharge from the hospital.    # It is recommended to the patient to follow up with your outpatient psychiatric provider and PCP.  # It was discussed with the patient, the impact of alcohol, drugs, tobacco have been there overall psychiatric and medical wellbeing, and total abstinence from substance use was recommended the patient.ed.  # Prescriptions provided or sent directly to preferred pharmacy at discharge. Patient agreeable to plan. Given  opportunity to ask questions. Appears to feel comfortable with discharge.    #  In the event of worsening symptoms, the patient is instructed to call the crisis hotline, 911 and or go to the nearest ED for appropriate evaluation and treatment of symptoms. To follow-up with primary care provider for other medical issues, concerns and or health care needs  # Patient was discharged home with a plan to follow up as noted below.    Principal Problem: MDD (major depressive disorder), recurrent, with catatonic features [facility]) Discharge Diagnoses: Principal Problem:   MDD (major depressive disorder), recurrent, with catatonic features (HCC) Active Problems:   PTSD (post-traumatic stress disorder)   OCD (obsessive compulsive disorder)   Psychosis, unspecified psychosis type [facility])    Past Medical History:  Past Medical History:  Diagnosis Date   Depression    Hematemesis 08/2017   Herniated disc    x3   Hypertension    Mental disorder     Past Surgical History:  Procedure Laterality Date   ESOPHAGOGASTRODUODENOSCOPY Left 08/27/2017   Procedure: ESOPHAGOGASTRODUODENOSCOPY (EGD);  Surgeon: Rollin Dover, MD;  Location: [facility] ENDOSCOPY;  Service: Endoscopy;  Laterality: Left;   SHOULDER SURGERY     WISDOM TOOTH EXTRACTION      Family History:  Family History  Problem Relation Age of Onset   Seizures Mother    Stroke Mother    Diabetes Mellitus II Mother     Social History:  Social History   Socioeconomic History   Marital status: Single    Spouse name: Not on file   Number of children: Not on file   Years of education: Not on file   Highest education level: Not on file  Occupational History   Not on file  Tobacco Use   Smoking status: Former    Current packs/day: 0.00    Types: Cigarettes    Quit date: 04/04/2012    Years since quitting: 11.8   Smokeless tobacco: Never   Tobacco comments:    Pt is nonverbal. Unknown.   Vaping Use   Vaping status: Never Used  Substance and  Sexual Activity   Alcohol use: No   Drug use: Yes    Types: Marijuana   Sexual activity: Never  Other Topics Concern   Not on file  Social History Narrative   Not on file   Social Drivers of Health   Financial Resource Strain: Not on file  Food Insecurity: Patient Unable To Answer (02/02/2024)   Hunger Vital Sign    Worried About Running Out of Food in the Last Year: Patient unable to answer    Ran Out of Food in the Last Year: Patient unable to answer  Recent Concern: Food Insecurity - Food Insecurity Present (01/28/2024)   Hunger Vital Sign    Worried About Running Out of Food in the Last Year: Sometimes true    Ran Out of Food in the Last Year: Sometimes true  Transportation Needs: Patient Unable To Answer (02/02/2024)   PRAPARE - Transportation    Lack of Transportation (Medical): Patient unable to answer    Lack of Transportation (Non-Medical): Patient unable to answer  Physical Activity: Not on file  Stress: Not on file (06/11/2023)  Social Connections: Unknown (01/28/2024)   Social Connection and Isolation Panel    Frequency of Communication with Friends and Family: Patient unable to answer    Frequency of Social Gatherings with Friends and Family: Patient unable to answer    Attends Religious Services: Patient unable to answer    Active Member of Clubs or Organizations: Patient unable to answer  Attends Banker Meetings: Patient unable to answer    Marital Status: Married  Intimate Partner Violence: Patient Unable To Answer (02/02/2024)   Humiliation, Afraid, Rape, and Kick questionnaire    Fear of Current or Ex-Partner: Patient unable to answer    Emotionally Abused: Patient unable to answer    Physically Abused: Patient unable to answer    Sexually Abused: Patient unable to answer     Physical Findings: AIMS:  , ,  ,  ,    CIWA:    COWS:     Musculoskeletal: Strength & Muscle Tone: within normal limits Gait & Station: normal Patient leans:  N/A  Psychiatric Specialty Exam  Presentation  General Appearance: Appropriate for Environment; Casual  Eye Contact:Good  Speech:Clear and Coherent; Normal Rate  Speech Volume:Normal   Mood and Affect  Mood:Euthymic  Affect:Appropriate; Congruent   Thought Process  Thought Processes:Coherent; Goal Directed; Linear  Descriptions of Associations:Intact  Orientation:Full (Time, Place and Person)  Thought Content:Logical  Hallucinations:Hallucinations: None  Ideas of Reference:None  Suicidal Thoughts:Suicidal Thoughts: No  Homicidal Thoughts:Homicidal Thoughts: No   Sensorium  Memory:Immediate Good; Recent Good; Remote Good  Judgment:Fair  Insight:Fair   Executive Functions  Concentration:Good  Attention Span:Good  Recall:Good  Fund of Knowledge:Good  Language:Good   Psychomotor Activity  Psychomotor Activity:Psychomotor Activity: Normal   Assets  Assets:Communication Skills; Desire for Improvement; Physical Health   Sleep  Sleep:Sleep: Good   Physical Exam: Physical Exam ROS Blood pressure 112/78, pulse 80, temperature 98.1 F (36.7 C), temperature source Oral, resp. rate 16, height 5' 10 (1.778 m), weight 77.3 kg, SpO2 98%. Body mass index is 24.45 kg/m.  Social History   Tobacco Use  Smoking Status Former   Current packs/day: 0.00   Types: Cigarettes   Quit date: 04/04/2012   Years since quitting: 11.8  Smokeless Tobacco Never  Tobacco Comments   Pt is nonverbal. Unknown.    Tobacco Cessation:  N/A, patient does not currently use tobacco products  Blood Alcohol level:  Lab Results  Component Value Date   [facility] <15 01/28/2024   ETH <10 10/27/2019    Metabolic Disorder Labs:  Lab Results  Component Value Date   HGBA1C 5.4 02/04/2024   MPG 108 02/04/2024   MPG 103 07/15/2016   No results found for: PROLACTIN Lab Results  Component Value Date   CHOL 153 02/04/2024   TRIG 155 (H) 02/04/2024   HDL 29 (L) 02/04/2024    CHOLHDL 5.3 02/04/2024   VLDL 31 02/04/2024   LDLCALC 93 02/04/2024   LDLCALC 140 (H) 07/15/2016    See Psychiatric Specialty Exam and Suicide Risk Assessment completed by Attending Physician prior to discharge.  Discharge destination:  Home  Is patient on multiple antipsychotic therapies at discharge:  No   Has Patient had three or more failed trials of antipsychotic monotherapy by history:  No  Recommended Plan for Multiple Antipsychotic Therapies: NA  Discharge Instructions     Diet - low sodium heart healthy   Complete by: As directed    Increase activity slowly   Complete by: As directed       Allergies as of 02/10/2024   No Known Allergies      Medication List     TAKE these medications      Indication  clonazePAM  0.5 MG tablet Commonly known as: KLONOPIN  Take 1 tablet (0.5 mg total) by mouth every 12 (twelve) hours for 7 days.  Indication: Catatonia   lisdexamfetamine 70 MG  capsule Commonly known as: VYVANSE  Take 1 capsule (70 mg total) by mouth daily.  Indication: Attention Deficit Hyperactivity Disorder   OLANZapine  20 MG tablet Commonly known as: ZYPREXA  Take 1 tablet (20 mg total) by mouth at bedtime.  Indication: Major Depressive Disorder, Psychotic Depressive Illness        Follow-up Information     Tasia Lung, MD. Go on 02/18/2024.   Specialty: Psychiatry Why: You have an appointment for medication management services on 02/18/24 at 8:30 am, in person.  You also have an appointment for therapy services on 03/04/24 at 1:00 pm, in person. Contact information: [address] [place] [address] [phone]                  Follow-up recommendations:   Activity:  as tolerated Diet:  heart healthy   Comments:  Prescriptions were given at discharge.  Patient is agreeable with the discharge plan.  Patient was given an opportunity to ask questions.  Patient appears to feel comfortable with discharge and denies any  current suicidal or homicidal thoughts.    Patient is instructed prior to discharge to: Take all medications as prescribed by mental healthcare provider. Report any adverse effects and or reactions from the medicines to outpatient provider promptly. In the event of worsening symptoms, patient is instructed to call the crisis hotline, 911 and or go to the nearest ED for appropriate evaluation and treatment of symptoms. Patient is to follow-up with primary care provider for other medical issues, concerns and or health care needs.     Signed: Prentice Espy, MD 02/10/2024, 7:48 AM

## 2024-02-10 NOTE — Discharge Summary (Signed)
 Physician Discharge Summary   Patient: Benjamin [name] MRN: [id number] DOB: [birth year]  Admit date:     01/28/2024  Discharge date: 02/02/2024  Discharge Physician: Carliss LELON Canales   PCP: Martel Francisco, MD   Recommendations at discharge:    Pt to be discharged inpatient psych facility.   If you experience worsening fever, chills, chest pain, shortness of breath, or other concerning symptoms, please call your PCP or go to the emergency department immediately.  Discharge Diagnoses: Principal Problem:   AKI (acute kidney injury) (HCC) Active Problems:   Delusional disorder (HCC)   PTSD (post-traumatic stress disorder)   Hypernatremia  Resolved Problems:   * No resolved hospital problems. *   Hospital Course:  43 y.o. male with medical history significant of depression, psychosis, PTSD, OCD, marijuana use admitted to the hospital by the family due to mental health concerns.  According to the family and the patient he has not eaten anything in over 2 weeks and has lost some weight.  Patient keeps telling me that he is on admission from God not to eat and drink.  He does not have any other complaints.  Does not provide any other history.  Denies taking any medications at home.   Assessment and Plan:   Acute kidney injury - Likely prerenal etiology given patient's lack of p.o. hydration.  Reordering IV fluids now that patient is on forced medication protocol, however patient is still refusing IV.  Appears to now be accepting some p.o. liquids.  Creatinine mildly improved from yesterday.  AKI appears to be resolved at this time.   Hypernatremia - Secondary to above.  Noted dehydration with free water deficit.  Reordered fluids NS at 100 mL/h however patient was refusing IV.  Is showing improvement this morning subsequent to patient improving with p.o. intake.  Appears resolved.   Religious Delusions/Acute psychosis - Currently stating he needs on a mission from God to not eat or drink.   Psych consulted and following closely.  Continues on forced medication protocol.  Medications per psych.  IV fluids ordered. Continues to be IVC.   Goals of care - At this time patient is medically cleared, AKI is resolved, patient is excepting oral hydration.  Will defer to psychiatry for transfer to inpatient psych facility.   Consultants: psychiatry Procedures performed: none  Disposition: inpatient psych facility Diet recommendation:  Regular diet  DISCHARGE MEDICATION: Allergies as of 02/02/2024   No Known Allergies      Medication List     STOP taking these medications    amphetamine-dextroamphetamine 20 MG tablet Commonly known as: ADDERALL   ARIPiprazole  9.75 MG/1.3ML injection Commonly known as: ABILIFY    clonazePAM  0.5 MG tablet Commonly known as: KLONOPIN    fluvoxaMINE  100 MG tablet Commonly known as: LUVOX    lisdexamfetamine 70 MG capsule Commonly known as: VYVANSE    pantoprazole  40 MG tablet Commonly known as: PROTONIX          Discharge Exam: Filed Weights   01/31/24 0337 02/01/24 0500 02/02/24 0431  Weight: 78 kg 76.4 kg 77.8 kg    GENERAL:  Alert, pleasant, no acute distress  HEENT:  EOMI CARDIOVASCULAR:  RRR, no murmurs appreciated RESPIRATORY:  Clear to auscultation, no wheezing, rales, or rhonchi GASTROINTESTINAL:  Soft, nontender, nondistended EXTREMITIES:  No LE edema bilaterally NEURO:  No new focal deficits appreciated SKIN: Dry PSYCH: Calm, selectively mute   Condition at discharge: improving  The results of significant diagnostics from this hospitalization (including imaging, microbiology, ancillary and laboratory) are  listed below for reference.   Imaging Studies: No results found.  Microbiology: Results for orders placed or performed during the hospital encounter of 08/26/17  Culture, blood (Routine X 2) w Reflex to ID Panel     Status: None   Collection Time: 08/26/17 11:45 PM   Specimen: BLOOD  Result Value Ref Range  Status   Specimen Description BLOOD RIGHT ANTECUBITAL  Final   Special Requests   Final    BOTTLES DRAWN AEROBIC AND ANAEROBIC Blood Culture adequate volume   Culture NO GROWTH 5 DAYS  Final   Report Status 09/01/2017 FINAL  Final  Culture, blood (Routine X 2) w Reflex to ID Panel     Status: None   Collection Time: 08/26/17 11:50 PM   Specimen: BLOOD RIGHT HAND  Result Value Ref Range Status   Specimen Description BLOOD RIGHT HAND  Final   Special Requests   Final    BOTTLES DRAWN AEROBIC AND ANAEROBIC Blood Culture adequate volume   Culture NO GROWTH 5 DAYS  Final   Report Status 09/01/2017 FINAL  Final    Labs: CBC: No results for input(s): WBC, NEUTROABS, HGB, HCT, MCV, PLT in the last 168 hours. Basic Metabolic Panel: Recent Labs  Lab 02/04/24 0637  NA 136  K 4.0  CL 103  CO2 25  GLUCOSE 98  BUN 23*  CREATININE 1.11  CALCIUM 9.1   Liver Function Tests: Recent Labs  Lab 02/04/24 0637  AST 40  ALT 48*  ALKPHOS 43  BILITOT 0.5  PROT 7.4  ALBUMIN 4.0   CBG: No results for input(s): GLUCAP in the last 168 hours.  Discharge time spent: 36 minutes.  Length of inpatient stay: 0 days  Signed: Carliss LELON Canales, DO [facility] 02/10/2024

## 2024-02-10 NOTE — Hospital Course (Addendum)
 Reason for Admission: Benjamin [name] is a 43 y.o. male initially admitted at [facility] for poor PO intake and later to [facility] due to concern for decompensated psychosis (hyperreligious delusion). He carries the psychiatric diagnoses of unspecified psychosis formally on Abilify  LAI, PTSD, major depressive disorder, recurrent, with catatonic features requiring ECT in 2018, with psychotic features, cannabis use disorder, OCD (cleaning/straightening/organizing) and has a past medical history of GI bleed.   Hospital Course  During the patient's hospitalization, patient had extensive initial psychiatric evaluation, and follow-up psychiatric evaluations every day.  Psychiatric diagnoses provided upon initial assessment:  #MDD, w/ catatonic features #History of OCD #PTSD #History of ADHD  Patient's psychiatric medications were adjusted on admission:  - Start olanzapine  Zydis 10 mg nightly   During the hospitalization, other adjustments were made to the patient's psychiatric medication regimen:  - Started scheduled ativan  due to [facility] 13 which improved down to 4 after ativan  challenge - Ativan  converted to clonazepam  to reduce medication burden - Restarted patient's vyvanse  at half dose - Increase zyprexa  to 20 mg  Patient's care was discussed during the interdisciplinary team meeting every day during the hospitalization.  The patient denies having side effects to prescribed psychiatric medication.  Gradually, patient started adjusting to milieu. The patient was evaluated each day by a clinical provider to ascertain response to treatment. Improvement was noted by the patient's report of decreasing symptoms, improved sleep and appetite, affect, medication tolerance, behavior, and participation in unit programming.  Patient was asked each day to complete a self inventory noting mood, mental status, pain, new symptoms, anxiety and concerns.    Symptoms were reported as significantly decreased or  resolved completely by discharge.   On day of discharge, the patient reports that their mood is stable. The patient denied having suicidal thoughts for more than 48 hours prior to discharge.  Patient denies having homicidal thoughts.  Patient denies having auditory hallucinations.  Patient denies any visual hallucinations or other symptoms of psychosis. The patient was motivated to continue taking medication with a goal of continued improvement in mental health.   The patient reports their target psychiatric symptoms of catatonia and MDD responded well to the psychiatric medications, and the patient reports overall benefit other psychiatric hospitalization. Supportive psychotherapy was provided to the patient. The patient also participated in regular group therapy while hospitalized. Coping skills, problem solving as well as relaxation therapies were also part of the unit programming.  Labs were reviewed with the patient, and abnormal results were discussed with the patient.  The patient is able to verbalize their individual safety plan to this provider.  # It is recommended to the patient to continue psychiatric medications as prescribed, after discharge from the hospital.    # It is recommended to the patient to follow up with your outpatient psychiatric provider and PCP.  # It was discussed with the patient, the impact of alcohol, drugs, tobacco have been there overall psychiatric and medical wellbeing, and total abstinence from substance use was recommended the patient.ed.  # Prescriptions provided or sent directly to preferred pharmacy at discharge. Patient agreeable to plan. Given opportunity to ask questions. Appears to feel comfortable with discharge.    # In the event of worsening symptoms, the patient is instructed to call the crisis hotline, 911 and or go to the nearest ED for appropriate evaluation and treatment of symptoms. To follow-up with primary care provider for other medical  issues, concerns and or health care needs  # Patient was  discharged home with a plan to follow up as noted below.

## 2024-02-22 ENCOUNTER — Inpatient Hospital Stay (HOSPITAL_COMMUNITY)
Admission: AD | Admit: 2024-02-22 | Discharge: 2024-03-01 | DRG: 885 | Disposition: A | Discharge status: Home / Self Care | Source: Intra-hospital | Attending: Psychiatry | Admitting: Psychiatry

## 2024-02-22 ENCOUNTER — Emergency Department (HOSPITAL_COMMUNITY)
Admission: EM | Admit: 2024-02-22 | Discharge: 2024-02-22 | Disposition: A | Discharge status: Other Acute Inpatient Hospital | Attending: Emergency Medicine | Admitting: Emergency Medicine

## 2024-02-22 ENCOUNTER — Encounter (HOSPITAL_COMMUNITY): Payer: Self-pay | Admitting: Psychiatry

## 2024-02-22 DIAGNOSIS — F32A Depression, unspecified: Secondary | ICD-10-CM | POA: Insufficient documentation

## 2024-02-22 DIAGNOSIS — F333 Major depressive disorder, recurrent, severe with psychotic symptoms: Principal | ICD-10-CM | POA: Diagnosis present

## 2024-02-22 DIAGNOSIS — Z818 Family history of other mental and behavioral disorders: Secondary | ICD-10-CM | POA: Diagnosis not present

## 2024-02-22 DIAGNOSIS — F1220 Cannabis dependence, uncomplicated: Secondary | ICD-10-CM | POA: Diagnosis present

## 2024-02-22 DIAGNOSIS — Z833 Family history of diabetes mellitus: Secondary | ICD-10-CM | POA: Diagnosis not present

## 2024-02-22 DIAGNOSIS — F22 Delusional disorders: Secondary | ICD-10-CM | POA: Diagnosis present

## 2024-02-22 DIAGNOSIS — F1010 Alcohol abuse, uncomplicated: Secondary | ICD-10-CM | POA: Diagnosis present

## 2024-02-22 DIAGNOSIS — Z823 Family history of stroke: Secondary | ICD-10-CM

## 2024-02-22 DIAGNOSIS — F429 Obsessive-compulsive disorder, unspecified: Secondary | ICD-10-CM | POA: Diagnosis present

## 2024-02-22 DIAGNOSIS — I10 Essential (primary) hypertension: Secondary | ICD-10-CM | POA: Diagnosis present

## 2024-02-22 DIAGNOSIS — F061 Catatonic disorder due to known physiological condition: Secondary | ICD-10-CM | POA: Diagnosis present

## 2024-02-22 DIAGNOSIS — Z87891 Personal history of nicotine dependence: Secondary | ICD-10-CM

## 2024-02-22 DIAGNOSIS — F329 Major depressive disorder, single episode, unspecified: Secondary | ICD-10-CM | POA: Diagnosis present

## 2024-02-22 DIAGNOSIS — F4310 Post-traumatic stress disorder, unspecified: Secondary | ICD-10-CM | POA: Diagnosis present

## 2024-02-22 DIAGNOSIS — Z91128 Patient's intentional underdosing of medication regimen for other reason: Secondary | ICD-10-CM | POA: Diagnosis not present

## 2024-02-22 DIAGNOSIS — Z79899 Other long term (current) drug therapy: Secondary | ICD-10-CM

## 2024-02-22 DIAGNOSIS — F29 Unspecified psychosis not due to a substance or known physiological condition: Secondary | ICD-10-CM | POA: Insufficient documentation

## 2024-02-22 DIAGNOSIS — R401 Stupor: Secondary | ICD-10-CM | POA: Diagnosis present

## 2024-02-22 DIAGNOSIS — K219 Gastro-esophageal reflux disease without esophagitis: Secondary | ICD-10-CM | POA: Diagnosis present

## 2024-02-22 LAB — COMPREHENSIVE METABOLIC PANEL WITH GFR
ALT: 32 U/L (ref 0–44)
AST: 20 U/L (ref 15–41)
Albumin: 4.5 g/dL (ref 3.5–5.0)
Alkaline Phosphatase: 45 U/L (ref 38–126)
Anion gap: 13 (ref 5–15)
BUN: 22 mg/dL — ABNORMAL HIGH (ref 6–20)
CO2: 23 mmol/L (ref 22–32)
Calcium: 9.6 mg/dL (ref 8.9–10.3)
Chloride: 102 mmol/L (ref 98–111)
Creatinine, Ser: 1.11 mg/dL (ref 0.61–1.24)
GFR, Estimated: >60 mL/min (ref >60)
Glucose, Bld: 90 mg/dL (ref 70–99)
Potassium: 3.9 mmol/L (ref 3.5–5.1)
Sodium: 138 mmol/L (ref 135–145)
Total Bilirubin: 1 mg/dL (ref 0.0–1.2)
Total Protein: 8.1 g/dL (ref 6.5–8.1)

## 2024-02-22 LAB — CBC WITH DIFFERENTIAL/PLATELET
Abs Immature Granulocytes: 0.02 K/uL (ref 0.00–0.07)
Basophils Absolute: 0 K/uL (ref 0.0–0.1)
Basophils Relative: 1 %
Eosinophils Absolute: 0.1 K/uL (ref 0.0–0.5)
Eosinophils Relative: 1 %
HCT: 44.8 % (ref 39.0–52.0)
Hemoglobin: 12.7 g/dL — ABNORMAL LOW (ref 13.0–17.0)
Immature Granulocytes: 0 %
Lymphocytes Relative: 25 %
Lymphs Abs: 1.5 K/uL (ref 0.7–4.0)
MCH: 20.9 pg — ABNORMAL LOW (ref 26.0–34.0)
MCHC: 28.3 g/dL — ABNORMAL LOW (ref 30.0–36.0)
MCV: 73.8 fL — ABNORMAL LOW (ref 80.0–100.0)
Monocytes Absolute: 0.2 K/uL (ref 0.1–1.0)
Monocytes Relative: 3 %
Neutro Abs: 4.4 K/uL (ref 1.7–7.7)
Neutrophils Relative %: 70 %
Platelets: 349 K/uL (ref 150–400)
RBC: 6.07 MIL/uL — ABNORMAL HIGH (ref 4.22–5.81)
RDW: 20.2 % — ABNORMAL HIGH (ref 11.5–15.5)
WBC: 6.2 K/uL (ref 4.0–10.5)
nRBC: 0 % (ref 0.0–0.2)

## 2024-02-22 LAB — RAPID URINE DRUG SCREEN, HOSP PERFORMED
Amphetamines: NOT DETECTED
Barbiturates: NOT DETECTED
Benzodiazepines: NOT DETECTED
Cocaine: NOT DETECTED
Opiates: NOT DETECTED
Tetrahydrocannabinol: POSITIVE — AB

## 2024-02-22 LAB — CBG MONITORING, ED: Glucose-Capillary: 83 mg/dL (ref 70–99)

## 2024-02-22 LAB — ETHANOL: Alcohol, Ethyl (B): <15 mg/dL (ref <15)

## 2024-02-22 MED ORDER — HALOPERIDOL LACTATE 5 MG/ML IJ SOLN: 5.0000 mg | Freq: Four times a day (QID) | INTRAMUSCULAR | Status: DC | PRN

## 2024-02-22 MED ORDER — HALOPERIDOL LACTATE 5 MG/ML IJ SOLN: 10.0000 mg | Freq: Three times a day (TID) | INTRAMUSCULAR | Status: DC | PRN

## 2024-02-22 MED ORDER — OLANZAPINE 10 MG PO TABS: 10.0000 mg | ORAL_TABLET | Freq: Every day | ORAL | Status: DC

## 2024-02-22 MED ORDER — HALOPERIDOL 5 MG PO TABS: 5.0000 mg | ORAL_TABLET | Freq: Three times a day (TID) | ORAL | Status: DC | PRN

## 2024-02-22 MED ORDER — LORAZEPAM 2 MG/ML IJ SOLN: 2.0000 mg | Freq: Three times a day (TID) | INTRAMUSCULAR | Status: DC | PRN

## 2024-02-22 MED ORDER — OLANZAPINE 10 MG PO TABS: 20.0000 mg | ORAL_TABLET | Freq: Every day | ORAL | Status: DC

## 2024-02-22 MED ORDER — ACETAMINOPHEN 325 MG PO TABS: 650.0000 mg | ORAL_TABLET | Freq: Four times a day (QID) | ORAL | Status: DC | PRN

## 2024-02-22 MED ORDER — MAGNESIUM HYDROXIDE 400 MG/5ML PO SUSP: 30.0000 mL | Freq: Every day | ORAL | Status: DC | PRN

## 2024-02-22 MED ORDER — LISDEXAMFETAMINE DIMESYLATE 70 MG PO CAPS: 70.0000 mg | ORAL_CAPSULE | Freq: Every day | ORAL | Status: DC

## 2024-02-22 MED ORDER — DIPHENHYDRAMINE HCL 25 MG PO CAPS: 50.0000 mg | ORAL_CAPSULE | Freq: Three times a day (TID) | ORAL | Status: DC | PRN

## 2024-02-22 MED ORDER — DIPHENHYDRAMINE HCL 50 MG/ML IJ SOLN: 50.0000 mg | Freq: Three times a day (TID) | INTRAMUSCULAR | Status: DC | PRN

## 2024-02-22 MED ORDER — HALOPERIDOL LACTATE 5 MG/ML IJ SOLN: 5.0000 mg | Freq: Three times a day (TID) | INTRAMUSCULAR | Status: DC | PRN

## 2024-02-22 MED ORDER — LISDEXAMFETAMINE DIMESYLATE 30 MG PO CAPS
70.0000 mg | ORAL_CAPSULE | Freq: Every day | ORAL | Status: DC
  Filled 2024-02-22: qty 1

## 2024-02-22 MED ORDER — ALUM & MAG HYDROXIDE-SIMETH 200-200-20 MG/5ML PO SUSP
30.0000 mL | ORAL | Status: DC | PRN
  Administered 2024-02-25 – 2024-02-28 (×2): 30 mL via ORAL
  Filled 2024-02-22 (×2): qty 30

## 2024-02-22 MED ORDER — OLANZAPINE 10 MG PO TABS
20.0000 mg | ORAL_TABLET | Freq: Every day | ORAL | Status: DC
  Filled 2024-02-22: qty 2

## 2024-02-22 MED ORDER — OLANZAPINE 10 MG IM SOLR: 10.0000 mg | Freq: Every day | INTRAMUSCULAR | Status: DC

## 2024-02-22 MED ORDER — HALOPERIDOL LACTATE 5 MG/ML IJ SOLN: 5.0000 mg | Freq: Once | INTRAMUSCULAR | Status: DC

## 2024-02-22 NOTE — ED Notes (Addendum)
 Gave report to Alcova, Charity fundraiser at [facility]. Left number in case Starla had any additional questions. Patient is admitted to [facility] to bed 400-2. Notifying [facility] to transport to [facility] d/t patient being IVC'd.

## 2024-02-22 NOTE — Progress Notes (Signed)
 Psychoeducational Group Note  Date:  02/22/2024 Time:  2214  Group Topic/Focus:  Wrap-Up Group:   The focus of this group is to help patients review their daily goal of treatment and discuss progress on daily workbooks.  Participation Level: Did Not Attend  Participation Quality:  Not Applicable  Affect:  Not Applicable  Cognitive:  Not Applicable  Insight:  Not Applicable  Engagement in Group: Not Applicable  Additional Comments:  The patient did not attend group since he was admitted later in the evening.   Jaystin Mcgarvey S 02/22/2024, 10:14 PM

## 2024-02-22 NOTE — ED Notes (Signed)
 Pt unable to give urine sample at this time

## 2024-02-22 NOTE — ED Notes (Signed)
 Pt changed out into psych scrubs. Belongings have been placed in the cabinets of Res side
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
## 2024-02-22 NOTE — Consult Note (Signed)
 [facility] Psychiatric Consult Initial  Patient Name: .Raoul Ciano  MRN: [id number]  DOB: 1981/01/19  Consult Order details:  Orders (From admission, onward)     Start     Ordered   02/22/24 1054  CONSULT TO CALL ACT TEAM       Ordering Provider: Levander Houston, MD  Provider:  (Not yet assigned)  Question:  Reason for Consult?  Answer:  Psych consult   02/22/24 1054             Mode of Visit: In person    Psychiatry Consult Evaluation  Service Date: February 22, 2024 LOS:  LOS: 0 days  Chief Complaint delusional  Primary Psychiatric Diagnoses  Psychosis, unspecified: schizophrenia vs bipolar I disorder, in current episode, with psychotic features   Assessment  Abrham Maslowski is a 43 y.o. male admitted: Presented to the EDfor 02/22/2024  9:20 AM for being on a on a 40 day fast. Last intake was milk on Saturday and juice 2 weeks ago. States he is on a mission for God to help people. 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    His current presentation of poor PO intake in the setting of psychosis is most consistent with schizophrenia vs bipolar I disorder with psychotic features. He meets criteria for IVC based on danger to self.  Current outpatient psychotropic medications include Abilify  and fluvoxamine  and historically he has had a good response to these medications. He was noncompliant with medications prior to admission as evidenced by collateral report. Please see plan below for detailed recommendations.   Diagnoses:  Active Hospital problems: Principal Problem:   Delusional disorder (HCC) Active Problems:   MDD (major depressive disorder)    Plan   ## Psychiatric Medication Recommendations:  Vyvanse  70 mg PO  daily for ADHD Zyprexa  20 mg PO at bedtime for Major Depressive Disorder, Psychotic Depressive Illness    ## Medical Decision Making Capacity: Patient does not at this time have the capacity to refuse or accept medications d/t poor insight into his current mental state.   ## Further Work-up:  -- UA, UDS, Cr -- most recent EKG on 02/22/2024 had QtC of 419 -- Pertinent labwork reviewed earlier this admission includes: B12, Ammonia , TSH, awaiting UDS     ## Disposition:-- We recommend inpatient psychiatric hospitalization after medical hospitalization. Patient has been involuntarily committed on 02/22/2024.    ## Behavioral / Environmental: - No specific recommendations at this time.                 ## Safety and Observation Level:  - Based on my clinical evaluation, I estimate the patient to be at moderate risk of self harm in the current setting. - At this time, we recommend  1:1 Observation. This decision is based on my review of the chart including patient's history and current presentation, interview of the patient, mental status examination, and consideration of suicide risk including evaluating suicidal ideation, plan, intent, suicidal or self-harm behaviors, risk factors, and protective factors. This judgment is based on our ability to directly address suicide risk, implement suicide prevention strategies, and develop a safety plan while the patient is in the clinical setting. Please contact our team if there is a concern that risk level has changed.   CSSR Risk Category:C-SSRS RISK CATEGORY: No  Risk   Suicide Risk Assessment: Patient has following modifiable risk factors for suicide: recklessness, medication noncompliance, active mental illness (to encompass adhd, tbi, mania, psychosis, trauma reaction), and recent loss (death, isolation, vocation), which we are addressing by medication management. Patient has following non-modifiable or demographic risk factors for suicide: male gender,  separation or divorce, and psychiatric hospitalization Patient has the following protective factors against suicide: Supportive family, Cultural, spiritual, or religious beliefs that discourage suicide, Minor children in the home, and no history of suicide attempts   Thank you for this consult request. Recommendations have been communicated to the primary team.  We will continue to follow at this time.  CATHALEEN ADAM, PMHNP       History of Present Illness  Relevant Aspects of Hospital ED Course:  Admitted on 02/22/2024 [facility] paperwork. States he is on a 40 day fast. Last intake was milk on Saturday and juice 2 weeks ago. States he is on a mission for God to help people.  No SI or HI noted willing to drink juice during triage.    Patient Report:  Samit Sylve, 43 y.o., male patient seen face to face by this provider, consulted with Dr. Merilee; and chart reviewed on 02/22/24.  On evaluation Fabyan Loughmiller reports that he is doing okay, states he does not know why he is here.  He states that he lives alone in a home, he used to be with his kids and dog, but states his kids now live with his brother.  He states that  he has an 23 year old son and a 52 year old daughter, he is currently not with their mother as he has a restraining order against her mom.  States that he used to work for [employer] about a year and a half ago, since then has been unemployed, states that either his mom helps him financially or he relies on God. He does confirm that he has been fasting for a while, in an effort to help people that are suffering.  He states that he knows he is on a mission to help God, he states that he was reborn by God 01 February 2010 and God started rebuilding him. Endorses that he has not been taking his medication and appears confused when asked about abilify . Denies suicidal and homicidal ideation both now and in the past. Denies current substance use. Does not currently see a psychiatrist or  therapist. Says I'm not threatening anyone but that those who oppose God should watch out. Clarifies that he is not God's instrument and that he is not planning on physically harming anyone.  Patient continues to refuse to eat food, but says he will drink milk, in order to be discharged.  He states his plan is to pass 16 more days, and his best will be complete. He states that his sleep is so-so some nights or better than others states he sleeps about 4 hours a night.  He denies using any illicit substances or alcohol, states he has not had any alcohol in 12 years, has not had any crack/cocaine in about 10 years, has not had any heroin in 20 years and the last time he smoked marijuana was about a month ago.  During evaluation Drako Maese is sitting in his recliner and appears to be in no acute distress.  He is alert, oriented x 3, able to name the president, year, his date of birth, calm, and cooperative. His mood is euthymic with congruent affect. He has normal speech, and behavior.  Psych ROS:  Depression: Denies Anxiety: Denies Mania (lifetime and current): Denies Psychosis: (lifetime and current): Currently  Collateral information:  [facility] spoke with person who IVC patient... 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 Remote history of Olanzapine .  Previous Med Trials: Trazodone  100 mg at bedtime, Haldol  (not helpful)  Therapy:  Unknown   Prior Psych Hospitalization: 01/05/2017 -- 01/16/2017 for an extended phase of catatonia after self-discontinuing abilify  unresponsive to Ativan . Stopped talking, sleeping, eating and drinking. Started at wall, would not verbally communicate. Needed 5 treatments.  Prior Self Harm: Denies Prior Violence: Denies   Family Psych History: Depression in brother Family Hx suicide: Father   Social History:  Educational Hx: Unknown Occupational Hx: Worked as a Therapist, music Hx: Unknown Living Situation: With two teenage children, exwife does not have custody Spiritual Hx: Grew up in a religious christian family, but becomes hyperreligious Access to weapons/lethal means: Brother denies   Substance History Alcohol: Not had a drink in 12 years Tobacco: Remote Illicit drugs: previous Kratom use.  Prescription drug abuse: Unknown Rehab hx: Unknown  Exam Findings  Physical Exam:  Vital Signs:  Temp:  [98.7 F (37.1 C)] 98.7 F (37.1 C) (07/21 0929) Pulse Rate:  [75] 75 (07/21 0929) Resp:  [15] 15 (07/21 0929) BP: (142)/(99) 142/99 (07/21 0929) SpO2:  [99 %] 99 % (07/21 0929) Weight:  [74.8 kg] 74.8 kg (07/21 1013) Blood pressure (!) 142/99, pulse 75, temperature 98.7 F (37.1 C), resp. rate 15, weight 74.8 kg, SpO2 99%. Body mass index is 23.66 kg/m.  Physical Exam Vitals and nursing note reviewed. Exam conducted with a chaperone present.  Neurological:     Mental Status: He is alert.  Psychiatric:        Attention and Perception: Attention normal.        Mood and Affect: Mood normal.        Speech: Speech normal.        Behavior: Behavior is cooperative.        Thought Content: Thought content is paranoid and delusional.        Judgment: Judgment is inappropriate.     Mental Status Exam: General Appearance: Sitting upright with shirt off in bed, staring forward, disheveled, appears sunburned in face and upper neck area  Orientation:  Oriented to self, situation,  place, year  Memory:  Grossly intact  Concentration:  Grossly intact  Recall:  Not assessed   Attention  Intact  Eye Contact:  Occasional, staring forward  Speech:  Some speech latency and slower  Language:  Intact  Volume:  Normal  Mood: I'm OK  Affect:  Restricted  Thought Process:  Linear, goal directed  Thought Content:  Delusions of grandeur  Suicidal Thoughts:  Denies  Homicidal Thoughts:  Denies  Judgement:  None  Insight:  None  Psychomotor Activity:  WNL  Akathisia: WNL  Fund of Knowledge: Unknown    Assets:  supportive family, housing  Cognition:  UTA  ADL's:  Not intact   AIMS (if indicated):        Other History   These have been pulled in through the EMR, reviewed, and updated if appropriate.  Family History:  The patient's family history includes Diabetes Mellitus II in his mother; Seizures in his mother; Stroke in his mother.  Medical History: Past Medical History:  Diagnosis Date   Depression    Hematemesis 08/2017   Herniated disc    x3   Hypertension    Mental disorder     Surgical History: Past Surgical History:  Procedure Laterality Date   ESOPHAGOGASTRODUODENOSCOPY Left 08/27/2017   Procedure: ESOPHAGOGASTRODUODENOSCOPY (EGD);  Surgeon: Rollin Dover, MD;  Location: [facility] ENDOSCOPY;  Service: Endoscopy;  Laterality: Left;   SHOULDER SURGERY     WISDOM TOOTH EXTRACTION       Medications:  No current facility-administered medications for this encounter.  Current Outpatient Medications:    clonazePAM  (KLONOPIN ) 0.5 MG tablet, Take 1 tablet (0.5 mg total) by mouth every 12 (twelve) hours for 7 days. (Patient not taking: Reported on 02/22/2024), Disp: 14 tablet, Rfl: 0   lisdexamfetamine (VYVANSE ) 70 MG capsule, Take 1 capsule (70 mg total) by mouth daily. (Patient not taking: Reported on 02/22/2024), Disp: , Rfl:    OLANZapine  (ZYPREXA ) 20 MG tablet, Take 1 tablet (20 mg total) by mouth at bedtime. (Patient not taking: Reported on 02/22/2024),  Disp: 30 tablet, Rfl: 0  Allergies: No Known Allergies  Sophia Sperry MOTLEY-MANGRUM, PMHNP

## 2024-02-22 NOTE — Plan of Care (Signed)
   Problem: Education: Goal: Knowledge of [name] Education information/materials will improve Outcome: Progressing Goal: Emotional status will improve Outcome: Progressing Goal: Mental status will improve Outcome: Progressing Goal: Verbalization of understanding the information provided will improve Outcome: Progressing   Problem: Activity: Goal: Interest or engagement in activities will improve Outcome: Progressing Goal: Sleeping patterns will improve Outcome: Progressing   Problem: Coping: Goal: Ability to verbalize frustrations and anger appropriately will improve Outcome: Progressing Goal: Ability to demonstrate self-control will improve Outcome: Progressing

## 2024-02-22 NOTE — ED Provider Notes (Signed)
 [place] EMERGENCY DEPARTMENT AT [facility] Provider Note   CSN: [id number] Arrival date & time: 02/22/24  9080     Patient presents with: Psychiatric Evaluation   Benjamin [name] is a 43 y.o. male.   HPI 43 year old male history of visual disorder, depressive disorder, OCD, presents today under IVC with [facility].  Per report patient has been fasting for 40 days although the duration of current fast is unknown.  Patient has not been taking his medications.  He was recently hospitalized due to fasting and he was severely dehydrated and required hospitalization for rehydration and due to kidney failure from this.  Friends have noted that he has continued to have the same behaviors.  They noted that he was weak and had quick speaking with his family and not taking any of his medications and were concerned because he appeared to continue to deteriorate.    Prior to Admission medications   Medication Sig Start Date End Date Taking? Authorizing Provider  clonazePAM  (KLONOPIN ) 0.5 MG tablet Take 1 tablet (0.5 mg total) by mouth every 12 (twelve) hours for 7 days. 02/10/24 02/17/24  Lynnette Barter, MD  lisdexamfetamine (VYVANSE ) 70 MG capsule Take 1 capsule (70 mg total) by mouth daily. 02/10/24   Lynnette Barter, MD  OLANZapine  (ZYPREXA ) 20 MG tablet Take 1 tablet (20 mg total) by mouth at bedtime. 02/10/24   Lynnette Barter, MD    Allergies: Patient has no known allergies.    Review of Systems  Updated Vital Signs BP (!) 142/99   Pulse 75   Temp 98.7 F (37.1 C)   Resp 15   Wt 74.8 kg   SpO2 99%   BMI 23.66 kg/m   Physical Exam Vitals and nursing note reviewed.  Constitutional:      Appearance: Normal appearance.  HENT:     Head: Normocephalic.     Right Ear: External ear normal.     Left Ear: External ear normal.     Nose: Nose normal.     Mouth/Throat:     Pharynx: Oropharynx is clear.  Eyes:     Pupils: Pupils are equal, round, and reactive to light.  Cardiovascular:     Rate  and Rhythm: Normal rate and regular rhythm.     Pulses: Normal pulses.  Pulmonary:     Effort: Pulmonary effort is normal.     Breath sounds: Normal breath sounds.  Abdominal:     General: Abdomen is flat.     Palpations: Abdomen is soft.  Musculoskeletal:        General: Normal range of motion.     Cervical back: Normal range of motion.  Skin:    General: Skin is warm.     Capillary Refill: Capillary refill takes less than 2 seconds.  Neurological:     General: No focal deficit present.     Mental Status: He is alert.  Psychiatric:        Attention and Perception: Attention normal.        Mood and Affect: Mood is depressed. Affect is blunt.        Speech: Speech normal.        Behavior: Behavior is slowed.        Thought Content: Thought content is delusional.        Cognition and Memory: Cognition normal.        Judgment: Judgment normal.     (all labs ordered are listed, but only abnormal results are displayed) Labs Reviewed  COMPREHENSIVE METABOLIC PANEL WITH GFR - Abnormal; Notable for the following components:      Result Value   BUN 22 (*)    All other components within normal limits  CBC WITH DIFFERENTIAL/PLATELET - Abnormal; Notable for the following components:   RBC 6.07 (*)    Hemoglobin 12.7 (*)    MCV 73.8 (*)    MCH 20.9 (*)    MCHC 28.3 (*)    RDW 20.2 (*)    All other components within normal limits  ETHANOL  RAPID URINE DRUG SCREEN, HOSP PERFORMED  CBG MONITORING, ED    EKG: None  Radiology: No results found.   Procedures   Medications Ordered in the ED - No data to display  Clinical Course as of 02/22/24 1457  Mon Feb 22, 2024  1052 Complete metabolic panel reviewed interpret within normal limits Complete blood count reviewed interpreted significant for mild anemia with hemoglobin 12.7 and MCV decreased at 73.8 otherwise within normal limits [DR]    Clinical Course User Index [DR] Levander Houston, MD                                  Medical Decision Making Amount and/or Complexity of Data Reviewed Labs: ordered.  43 year old male who is brought in today due to fasting and weakness.  He believes that he is doing God's work by fasting.  He has had similar episodes in the past that were related to delusional disorders and patient has suffered from dehydration due to this.  He was IVC by friends/family.  He is evaluated here in the ED and is found to have continuing delusional disorders and IVC is continued.  Labs were obtained and patient does not appear to be any acute abnormalities at this time.  He appears stable for psychiatric evaluation      Final diagnoses:  Delusional disorder [facility])    ED Discharge Orders     None          Levander Houston, MD 02/22/24 [phone]

## 2024-02-22 NOTE — ED Notes (Signed)
 Report given to [place], [place]

## 2024-02-22 NOTE — ED Notes (Signed)
 [facility] transport at [phone] option 3 for patient to transport to [facility]. Unable to state a time of transport.

## 2024-02-22 NOTE — ED Notes (Signed)
 [place]

## 2024-02-22 NOTE — ED Notes (Signed)
 [facility] is here to take patient to [facility].

## 2024-02-22 NOTE — ED Triage Notes (Signed)
 Patient has came in with IVC paperwork. States he is on a 40 day fast. Last intake was milk on Saturday and juice 2 weeks ago. States he is on a mission for God to help people.  No SI or HI noted willing to drink juice during triage.

## 2024-02-22 NOTE — Progress Notes (Signed)
 Pt has been accepted to [facility] on 02/22/2024 . Bed assignment:400-2   Pt meets inpatient criteria per Cathaleen Adam, NP  Attending Physician will be Dr. Prentis   Report can be called to: Adult unit: [phone]  Pt can arrive tonight pending labs   Care Team Notified: [facility] Cherylynn Ernst, RN, Cathaleen Adam, NP, Landry Bull, RN

## 2024-02-22 NOTE — ED Notes (Signed)
 Patient states, still fasting.  Patient has been drinking milk and ginger ale.

## 2024-02-23 ENCOUNTER — Other Ambulatory Visit: Payer: Self-pay

## 2024-02-23 DIAGNOSIS — F333 Major depressive disorder, recurrent, severe with psychotic symptoms: Secondary | ICD-10-CM

## 2024-02-23 MED ORDER — OLANZAPINE 10 MG PO TABS: 10.0000 mg | ORAL_TABLET | Freq: Every day | ORAL | Status: DC

## 2024-02-23 MED ORDER — TRAZODONE HCL 50 MG PO TABS
50.0000 mg | ORAL_TABLET | Freq: Every evening | ORAL | Status: DC | PRN
  Filled 2024-02-23 (×3): qty 1

## 2024-02-23 MED ORDER — LORAZEPAM 1 MG PO TABS
1.0000 mg | ORAL_TABLET | Freq: Three times a day (TID) | ORAL | Status: DC
  Administered 2024-02-23 – 2024-02-25 (×6): 1 mg via ORAL
  Filled 2024-02-23 (×6): qty 1

## 2024-02-23 MED ORDER — OLANZAPINE 5 MG PO TBDP
5.0000 mg | ORAL_TABLET | Freq: Every day | ORAL | Status: DC
  Administered 2024-02-23: 5 mg via ORAL
  Filled 2024-02-23: qty 1

## 2024-02-23 MED ORDER — ENSURE PLUS HIGH PROTEIN PO LIQD
237.0000 mL | Freq: Two times a day (BID) | ORAL | Status: DC
  Administered 2024-02-23 – 2024-02-29 (×12): 237 mL via ORAL
  Filled 2024-02-23 (×13): qty 237
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
## 2024-02-23 NOTE — Group Note (Signed)
 Recreation Therapy Group Note   Group Topic:Animal Assisted Therapy   Group Date: 02/23/2024 Start Time: 9052 End Time: 1030 Facilitators: Jovaun Levene-McCall, LRT,CTRS Location: 300 Hall Dayroom   Animal-Assisted Activity (AAA) Program Checklist/Progress Notes Patient Eligibility Criteria Checklist & Daily Group note for Rec Tx Intervention  AAA/T Program Assumption of Risk Form signed by Patient/ or Parent Legal Guardian Yes  Patient is free of allergies or severe asthma Yes  Patient reports no fear of animals Yes  Patient reports no history of cruelty to animals No  Patient understands his/her participation is voluntary Yes  Patient washes hands before animal contact Yes  Patient washes hands after animal contact Yes  Behavioral Response:    Education: Hand Washing, Appropriate Animal Interaction   Education Outcome: Acknowledges education.    Affect/Mood: N/A   Participation Level: Did not attend    Clinical Observations/Individualized Feedback:      Plan: Continue to engage patient in RT group sessions 2-3x/week.   Damar Petit-McCall, LRT,CTRS 02/23/2024 1:26 PM

## 2024-02-23 NOTE — BHH Suicide Risk Assessment (Signed)
 BHH INPATIENT:  Family/Significant Other Suicide Prevention Education  Suicide Prevention Education:  Patient Refusal for Family/Significant Other Suicide Prevention Education: The patient Benjamin [name] has refused to provide written consent for family/significant other to be provided Family/Significant Other Suicide Prevention Education during admission and/or prior to discharge.  Physician notified.  Jenkins LULLA Primer 02/23/2024, 10:27 AM

## 2024-02-23 NOTE — Progress Notes (Signed)
 02/23/2024 Day Shift Food Log:  4 milk cartons 2 gingerales 1 grape juice 1 ensure 1 malawi sandwich 100% of full dinner plate Salad with 2 dressings 1 soda

## 2024-02-23 NOTE — BHH Suicide Risk Assessment (Signed)
 [facility] Admission Suicide Risk Assessment   Nursing information obtained from:  Patient Demographic factors:  Male, Caucasian, Living alone Current Mental Status:  NA Loss Factors:  NA Historical Factors:  NA Risk Reduction Factors:  NA  Total Time spent with patient: 1 hour Principal Problem: MDD (major depressive disorder), recurrent, severe, with psychosis (HCC) Diagnosis:  Principal Problem:   MDD (major depressive disorder), recurrent, severe, with psychosis (HCC) Active Problems:   Catatonic withdrawn type  Subjective Data:  Benjamin [name] is a 43 yr old male who presented under IVC to [facility] on 7/21 due to concerns for fasting and not drinking or eating, he was admitted to [facility] on 7/22.  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    When asked what brought him to the hospital he reports that some of his friends were concerned about him.  When asked why he reports that he has been fasting with a goal of not eating or drinking for 40 days.  He reports he is doing this to cleanse himself for God.  When asked about his medications after he was discharged earlier this month from the hospital he reports he stopped taking them as soon as he got home.  When asked why he did this he reports he does not know.  When asked what his family thought of this he reports that he has had no contact with his family.  He reports that he is not doing well today.  He reports that he is nauseous, dizzy, and has a headache.  When asked if he would consider restarting his medications he reports that he would.  When asked if he would drink he reports he would drink something to take his medications.  When asked if family members could be contacted he  reports that they cannot.  He reports no other concerns at present.  He then did not want to answer any other questions.  When asked if any significant changes to his health and happened since his previous hospitalization he reported no.  Information was obtained from previous H&P.  From H&P Dr. Lynnette 7/2- Collateral information:  Contacted Benjamin [name], Brother at [phone] on 01/29/2024   Helped patient get admitted -- drove down from [place]. Patient is typically a very hardworking individual, great dad with two kids. Ten years ago, patient's wife was a bad person and patient had a psychotic break in 2016. At that time, he had just left his ex-wife and did not speak with anyone for a month. Had lost 70 lbs and not eaten for a month. Completely non-verbal. Hyper-religious: told brother to fast. Was brought up in the church, but this is different from baseline. 3 weeks ago, mother was admitted into hospice. May have been the reason for patient's current psychotic break. At some point over that time stopped taking his meds. Unsure of names, but on an antipsychotic and mood stabilizer. Had severe paranoia (people listening in). If I were to guess, he stopped taking his meds about 1 month ago. Was acting different on fathers day weekend, lots of religious talk. This scared brother -- would leave the kids with brother and go on long drives, all day long. Had been isolating. No firearms and no violence. Certainly not a dangerous person. Girlfriend drove down from [place] .  Had to coax him into going into the hospital for the purpose of checking on his vitals. Lost ~30 lbs since father's day, extremely significant. Very similar episode from previous. Believes he will be back to his normal self on his meds. Largest factor: mother moving from nursing home to hospice. He did confirm with brother that he stopped taking his medication. Normally, very productive. Out of the norm.      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 Remote history of Olanzapine .  Previous Med Trials: Trazodone  100 mg at bedtime, Haldol  (not helpful)  Therapy: Unknown   Prior Psych Hospitalization: 01/05/2017 -- 01/16/2017 for an extended phase of catatonia after self-discontinuing abilify  unresponsive to Ativan . Stopped talking, sleeping, eating and drinking. Started at wall, would not verbally communicate. Needed 5 treatments.  Prior Self Harm: Denies Prior Violence: Denies   Family Psych History: Depression in brother Family Hx suicide: Father   Social History:  Educational Hx: Unknown Occupational Hx: Worked as a Therapist, music Hx: Unknown Living Situation: With two teenage children, exwife does not have custody Spiritual Hx: Grew up in a religious christian family, but becomes hyperreligious Access to weapons/lethal means: Brother denies   Substance History Alcohol: Remote, per chart review Tobacco: Remote Illicit drugs: previous Kratom use.  Prescription drug abuse: Unknown Rehab hx: Unknown  Continued Clinical Symptoms:  Alcohol Use Disorder Identification Test Final Score (AUDIT): 0 The Alcohol Use Disorders Identification Test, Guidelines for Use in Primary Care, Second Edition.  World Science writer [facility]). Score between 0-7:  no or low risk or alcohol related problems. Score between 8-15:  moderate risk of alcohol related problems. Score between 16-19:  high risk of alcohol related problems. Score 20 or above:  warrants further diagnostic evaluation for alcohol dependence and  treatment.   CLINICAL FACTORS:   Depression:   Delusional Severe More than one psychiatric diagnosis Currently Psychotic Unstable or Poor Therapeutic Relationship Previous Psychiatric Diagnoses and Treatments Medical Diagnoses and Treatments/Surgeries   Musculoskeletal: Strength & Muscle Tone: within normal limits Gait & Station: normal Patient leans: N/A  Psychiatric Specialty Exam:  Presentation  General Appearance:  Disheveled  Eye Contact: Poor  Speech: Clear and Coherent; Slow (minimal)  Speech Volume: Normal  Handedness: Right   Mood and Affect  Mood: -- (ok)  Affect: Constricted; Flat   Thought Process  Thought Processes: Linear  Descriptions of Associations:Intact  Orientation:Full (Time, Place and Person)  Thought Content:Delusions  History of Schizophrenia/Schizoaffective disorder:No  Duration of Psychotic Symptoms:Less than six months  Hallucinations:Hallucinations: None  Ideas of Reference:Delusions  Suicidal Thoughts:Suicidal Thoughts: No  Homicidal Thoughts:Homicidal Thoughts: No   Sensorium  Memory: Immediate Fair  Judgment: Intact  Insight: Present   Executive Functions  Concentration: Fair  Attention Span: Fair  Recall: Fair  Fund of Knowledge: Fair  Language: Fair   Psychomotor Activity  Psychomotor Activity:Psychomotor Activity: Normal   Assets  Assets: Physical Health   Sleep  Sleep:Sleep: Fair    Physical Exam: Physical Exam Vitals and nursing note reviewed.  Constitutional:      General: He is not in acute distress.    Appearance: Normal appearance. He is normal  weight. He is not ill-appearing or toxic-appearing.  HENT:     Head: Normocephalic and atraumatic.  Pulmonary:     Effort: Pulmonary effort is normal.  Musculoskeletal:        General: Normal range of motion.  Neurological:     General: No focal deficit present.     Mental Status: He is alert.    Review of  Systems  Respiratory:  Negative for cough and shortness of breath.   Cardiovascular:  Negative for chest pain.  Gastrointestinal:  Positive for nausea. Negative for abdominal pain, constipation, diarrhea and vomiting.  Neurological:  Positive for dizziness and headaches. Negative for weakness.  Psychiatric/Behavioral:  Positive for depression. Negative for hallucinations and suicidal ideas. The patient is not nervous/anxious.    Blood pressure 109/77, pulse (!) 46, temperature 97.6 F (36.4 C), temperature source Oral, resp. rate 16, height 5' 10 (1.778 m), weight 78 kg, SpO2 100%. Body mass index is 24.68 kg/m.   COGNITIVE FEATURES THAT CONTRIBUTE TO RISK:  Closed-mindedness, Loss of executive function, Polarized thinking, and Thought constriction (tunnel vision)    SUICIDE RISK:   Minimal risk of intentionally acting to harm himself, however, extreme risk of self harm due to neglect and not eating or drinking.  PLAN OF CARE:  Benecio Kluger is a 43 yr old male who presented under IVC to [facility] on 7/21 due to concerns for fasting and not drinking or eating, he was admitted to [facility] on 7/22.  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      Tad is willing to answer some questions and has moved around the unit some.  However, given his history of Catatonia and stopping all medications as soon as he was discharged we will restart scheduled Ativan .  We will continue Zyprexa  as this did help with his symptoms but will start it at a low dose to not worsen his Catatonia.     MDD, Recurrent, Severe, w/psychosis w/ Catatonic features: -Reduce Zyprexa  to 5 mg QHS for psychosis -Start Ativan  1 mg TID for Catatonia -Continue  Agitation Protocol: Haldol /Ativan /Benadryl      -Continue PRN's: Tylenol , Maalox, Atarax , Milk of Magnesia, Trazodone   I certify that inpatient services furnished can reasonably be expected to improve the patient's condition.   Benjamin GORMAN Rosser, DO 02/23/2024, 1:37 PM

## 2024-02-23 NOTE — Progress Notes (Signed)
   02/23/24 1000  Psych Admission Type (Psych Patients Only)  Admission Status Involuntary  Psychosocial Assessment  Patient Complaints Suspiciousness;Worrying  Eye Contact Brief  Facial Expression Blank;Flat;Worried  Affect Anxious  Speech Logical/coherent  Interaction Evasive  Motor Activity Slow  Appearance/Hygiene Unremarkable  Behavior Characteristics Cooperative  Mood Depressed;Anxious;Preoccupied  Thought Process  Coherency Blocking  Content WDL  Delusions None reported or observed  Perception WDL  Hallucination None reported or observed  Judgment Poor  Confusion None  Danger to Self  Current suicidal ideation? Denies  Agreement Not to Harm Self Yes  Description of Agreement verbal  Danger to Others  Danger to Others None reported or observed

## 2024-02-23 NOTE — Group Note (Signed)
 LCSW Group Therapy Note   Group Date: 02/23/2024 Start Time: 1100 End Time: 1200   Participation:  did not attend  Type of Therapy:  Group Therapy  Topic:  Stress Less:  Nurturing Your Body and Mind through Calm  Objective:  Learn techniques for managing stress through body relaxation, mindfulness, and self-compassion.  Goals: Use body relaxation techniques, such as Box Breathing and Progressive Muscle Relaxation, to reduce physical tension. Practice mindfulness to break the cycle of overthinking and mental chatter. Embrace self-compassion to handle stress with kindness and resilience.  Summary:  Today's session focused on calming the body with relaxation techniques, breaking the cycle of stress with mindfulness, and using self-compassion to manage challenges more gracefully. These tools help reduce stress and foster a balanced, peaceful mindset.  Therapeutic Modalities used:  Elements of CBT ( cognitive restructuring)  Elements of DBT (box breathing, progressive body relaxation, mindfulness, acceptance)    Benjamin [name] O Letitia Sabala, LCSWA 02/23/2024  7:00 PM

## 2024-02-23 NOTE — H&P (Signed)
 Psychiatric Admission Assessment Adult  Patient Identification: Benjamin Benjamin [name] MRN:  [id number] Date of Evaluation:  02/23/2024 Chief Complaint:  MDD (major depressive disorder), recurrent, severe, with psychosis (HCC) [F33.3] Principal Diagnosis: MDD (major depressive disorder), recurrent, severe, with psychosis (HCC) Diagnosis:  Principal Problem:   MDD (major depressive disorder), recurrent, severe, with psychosis (HCC) Active Problems:   Catatonic withdrawn type  History of Present Illness:  Benjamin Benjamin [name] is a 43 yr old male who presented under IVC to [facility] on 7/21 due to concerns for fasting and not drinking or eating, he was admitted to [facility] on 7/22.  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   When asked what brought him to the hospital he reports that some of his friends were concerned about him.  When asked why he reports that he has been fasting with a goal of not eating or drinking for 40 days.  He reports he is doing this to cleanse himself for God.  When asked about his medications after he was discharged earlier this month from the hospital he reports he stopped taking them as soon as he got home.  When asked why he did this he reports he does not know.  When asked what his family thought of this he reports that he has had no contact with his family.  He reports that he is not doing well today.  He reports that he is nauseous, dizzy, and has a headache.  When asked if he would consider restarting his medications he reports that he would.  When asked if he would drink he reports he would drink something to take his medications.  When asked if family members could be contacted he reports that they cannot.  He reports no  other concerns at present.  He then did not want to answer any other questions.  When asked if any significant changes to his health and happened since his previous hospitalization he reported no.  Information was obtained from previous H&P.  From H&P Dr. Lynnette 7/2- Collateral information:  Contacted Benjamin Benjamin [name], Brother at [phone] on 01/29/2024   Helped patient get admitted -- drove down from [place]. Patient is typically a very hardworking individual, great dad with two kids. Ten years ago, patient'Benjamin [name] wife was a bad person and patient had a psychotic break in 2016. At that time, he had just left his ex-wife and did not speak with anyone for a month. Had lost 70 lbs and not eaten for a month. Completely non-verbal. Hyper-religious: told brother to fast. Was brought up in the church, but this is different from baseline. 3 weeks ago, mother was admitted into hospice. May have been the reason for patient'Benjamin [name] current psychotic break. At some point over that time stopped taking his meds. Unsure of names, but on an antipsychotic and mood stabilizer. Had severe paranoia (people listening in). If I were to guess, he stopped taking his meds about 1 month ago. Was acting different on fathers day weekend, lots of religious talk. This scared brother -- would leave the kids with brother and go on long drives, all day long. Had been isolating. No firearms and no violence. Certainly not a dangerous person. Girlfriend drove down from [place] . Had to coax him into going into the hospital for the purpose of checking on  his vitals. Lost ~30 lbs since father'Benjamin [name] day, extremely significant. Very similar episode from previous. Believes he will be back to his normal self on his meds. Largest factor: mother moving from nursing home to hospice. He did confirm with brother that he stopped taking his medication. Normally, very productive. Out of the norm.      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 Remote history of Olanzapine .  Previous Med Trials: Trazodone  100 mg at bedtime, Haldol  (not helpful)  Therapy: Unknown   Prior Psych Hospitalization: 01/05/2017 -- 01/16/2017 for an extended phase of catatonia after self-discontinuing abilify  unresponsive to Ativan . Stopped talking, sleeping, eating and drinking. Started at wall, would not verbally communicate. Needed 5 treatments.  Prior Self Harm: Denies Prior Violence: Denies   Family Psych History: Depression in brother Family Hx suicide: Father   Social History:  Educational Hx: Unknown Occupational Hx: Worked as a Therapist, music Hx: Unknown Living Situation: With two teenage children, exwife does not have custody Spiritual Hx: Grew up in a religious christian family, but becomes hyperreligious Access to weapons/lethal means: Brother denies   Substance History Alcohol: Remote, per chart review Tobacco: Remote Illicit drugs: previous Kratom use.  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   Is the patient at risk to self? Yes.    Has the patient been a risk to self in the past 6 months? Yes.    Has the patient been a risk to self within the distant past? Yes.    Is the patient a risk to others? No.  Has the patient been a risk to others in the past 6 months? No.  Has the patient been a risk to others within the distant past? No.   [place] Scale:  Flowsheet Row [facility] Most recent reading at 02/22/2024 11:00 PM ED from 02/22/2024 in [facility] Department at [facility] Most recent reading at 02/22/2024  9:27 AM Admission (Discharged) [facility] Most recent reading at 02/02/2024  9:20 PM  C-SSRS RISK CATEGORY No Risk No Risk Error: Question 2 not populated     Prior Inpatient Therapy: Yes.   If yes, describe [facility] 02/2024  Prior Outpatient Therapy: No. If yes, describe N/A   Alcohol Screening: Patient refused Alcohol Screening Tool: Yes 1. How often do you have a drink containing alcohol?: Never 2. How many drinks containing alcohol do you have on a typical day when you are drinking?: 1 or 2 3. How often do you have six or more drinks on one occasion?: Never AUDIT-C Score: 0 4. How  often during the last year have you found that you were not able to stop drinking once you had started?: Never 5. How often during the last year have you failed to do what was normally expected from you because of drinking?: Never 6. How often during the last year have you needed a first drink in the morning to get yourself going after a heavy drinking session?: Never 7. How often during the last year have you had a feeling of guilt of remorse after drinking?: Never 8. How often during the last year have you been unable to remember what happened the night before because you had been drinking?: Never 9. Have you or someone else been  injured as a result of your drinking?: No 10. Has a relative or friend or a doctor or another health worker been concerned about your drinking or suggested you cut down?: No Alcohol Use Disorder Identification Test Final Score (AUDIT): 0 Substance Abuse History in the last 12 months:  No. Consequences of Substance Abuse: NA Previous Psychotropic Medications: Yes  Haldol , Zyprexa , Abilify , Vyvanse , Luvox  Psychological Evaluations: No  Past Medical History:  Past Medical History:  Diagnosis Date   Depression    Hematemesis 08/2017   Herniated disc    x3   Hypertension    Mental disorder     Past Surgical History:  Procedure Laterality Date   ESOPHAGOGASTRODUODENOSCOPY Left 08/27/2017   Procedure: ESOPHAGOGASTRODUODENOSCOPY (EGD);  Surgeon: Rollin Dover, MD;  Location: [facility] ENDOSCOPY;  Service: Endoscopy;  Laterality: Left;   SHOULDER SURGERY     WISDOM TOOTH EXTRACTION     Family History:  Family History  Problem Relation Age of Onset   Seizures Mother    Stroke Mother    Diabetes Mellitus II Mother    Family Psychiatric  History:  Father- Suicide Brother- Depression Tobacco Screening:  Social History   Tobacco Use  Smoking Status Former   Current packs/day: 0.00   Types: Cigarettes   Quit date: 04/04/2012   Years since quitting: 11.8  Smokeless Tobacco Never  Tobacco Comments   Pt is nonverbal. Unknown.     BH Tobacco Counseling     Are you interested in Tobacco Cessation Medications?  No, patient refused Counseled patient on smoking cessation:  Refused/Declined practical counseling Reason Tobacco Screening Not Completed: Patient Refused Screening       Social History:  Social History   Substance and Sexual Activity  Alcohol Use No     Social History   Substance and Sexual Activity  Drug Use Yes   Types: Marijuana    Additional Social History: Marital status: Married Number of Years Married: 5 Separated, when?: N/A What types of issues is patient  dealing with in the relationship?: Not to be funny but aren't we all Additional relationship information: Does not stay in contact with wife It'Benjamin [name] been a long time. I don't even know where she lives anymore Are you sexually active?: No What is your sexual orientation?: Heterosexual Has your sexual activity been affected by drugs, alcohol, medication, or emotional stress?: No Does patient have children?: Yes How many children?: 2 How is patient'Benjamin [name] relationship with their children?: 11yo daughter, 16yo son I have a really good relationship with them                         Allergies:  No Known Allergies Lab Results:  Results for orders placed or performed during the hospital encounter of 02/22/24 (from the past  48 hours)  CBG monitoring, ED     Status: None   Collection Time: 02/22/24  9:30 AM  Result Value Ref Range   Glucose-Capillary 83 70 - 99 mg/dL    Comment: Glucose reference range applies only to samples taken after fasting for at least 8 hours.  Comprehensive metabolic panel     Status: Abnormal   Collection Time: 02/22/24  9:49 AM  Result Value Ref Range   Sodium 138 135 - 145 mmol/L   Potassium 3.9 3.5 - 5.1 mmol/L   Chloride 102 98 - 111 mmol/L   CO2 23 22 - 32 mmol/L   Glucose, Bld 90 70 - 99 mg/dL    Comment: Glucose reference range applies only to samples taken after fasting for at least 8 hours.   BUN 22 (H) 6 - 20 mg/dL   Creatinine, Ser 8.88 0.61 - 1.24 mg/dL   Calcium 9.6 8.9 - 89.6 mg/dL   Total Protein 8.1 6.5 - 8.1 g/dL   Albumin 4.5 3.5 - 5.0 g/dL   AST 20 15 - 41 U/L   ALT 32 0 - 44 U/L   Alkaline Phosphatase 45 38 - 126 U/L   Total Bilirubin 1.0 0.0 - 1.2 mg/dL   GFR, Estimated >39 >39 mL/min    Comment: (NOTE) Calculated using the CKD-EPI Creatinine Equation (2021)    Anion gap 13 5 - 15    Comment: Performed at [facility], [address] [place], [place] [address]  Ethanol     Status: None   Collection Time:  02/22/24  9:49 AM  Result Value Ref Range   Alcohol, Ethyl (B) <15 <15 mg/dL    Comment: (NOTE) For medical purposes only. Performed at [facility], [address], [place], [place] [address]   CBC with Diff     Status: Abnormal   Collection Time: 02/22/24  9:49 AM  Result Value Ref Range   WBC 6.2 4.0 - 10.5 K/uL   RBC 6.07 (H) 4.22 - 5.81 MIL/uL   Hemoglobin 12.7 (L) 13.0 - 17.0 g/dL   HCT 55.1 60.9 - 47.9 %   MCV 73.8 (L) 80.0 - 100.0 fL   MCH 20.9 (L) 26.0 - 34.0 pg   MCHC 28.3 (L) 30.0 - 36.0 g/dL   RDW 79.7 (H) 88.4 - 84.4 %   Platelets 349 150 - 400 K/uL   nRBC 0.0 0.0 - 0.2 %   Neutrophils Relative % 70 %   Neutro Abs 4.4 1.7 - 7.7 K/uL   Lymphocytes Relative 25 %   Lymphs Abs 1.5 0.7 - 4.0 K/uL   Monocytes Relative 3 %   Monocytes Absolute 0.2 0.1 - 1.0 K/uL   Eosinophils Relative 1 %   Eosinophils Absolute 0.1 0.0 - 0.5 K/uL   Basophils Relative 1 %   Basophils Absolute 0.0 0.0 - 0.1 K/uL   Immature Granulocytes 0 %   Abs Immature Granulocytes 0.02 0.00 - 0.07 K/uL    Comment: Performed at [facility], [address], [place], [place] [address]  Urine rapid drug screen (hosp performed)     Status: Abnormal   Collection Time: 02/22/24  6:29 PM  Result Value Ref Range   Opiates NONE DETECTED NONE DETECTED   Cocaine NONE DETECTED NONE DETECTED   Benzodiazepines NONE DETECTED NONE DETECTED   Amphetamines NONE DETECTED NONE DETECTED   Tetrahydrocannabinol POSITIVE (A) NONE DETECTED   Barbiturates NONE DETECTED NONE DETECTED    Comment: (  NOTE) DRUG SCREEN FOR MEDICAL PURPOSES ONLY.  IF CONFIRMATION IS NEEDED FOR ANY PURPOSE, NOTIFY LAB WITHIN 5 DAYS.  LOWEST DETECTABLE LIMITS FOR URINE DRUG SCREEN Drug Class                     Cutoff (ng/mL) Amphetamine and metabolites    1000 Barbiturate and metabolites    200 Benzodiazepine                 200 Opiates and metabolites        300 Cocaine and metabolites         300 THC                            50 Performed at [facility], [address] [place], [place] [address]     Blood Alcohol level:  Lab Results  Component Value Date   [facility] <15 02/22/2024   ETH <15 01/28/2024    Metabolic Disorder Labs:  Lab Results  Component Value Date   HGBA1C 5.4 02/04/2024   MPG 108 02/04/2024   MPG 103 07/15/2016   No results found for: PROLACTIN Lab Results  Component Value Date   CHOL 153 02/04/2024   TRIG 155 (H) 02/04/2024   HDL 29 (L) 02/04/2024   CHOLHDL 5.3 02/04/2024   VLDL 31 02/04/2024   LDLCALC 93 02/04/2024   LDLCALC 140 (H) 07/15/2016    Current Medications: Current Facility-Administered Medications  Medication Dose Route Frequency Provider Last Rate Last Admin   acetaminophen  (TYLENOL ) tablet 650 mg  650 mg Oral Q6H PRN Motley-Mangrum, Jadeka A, PMHNP       alum & mag hydroxide-simeth (MAALOX/MYLANTA) 200-200-20 MG/5ML suspension 30 mL  30 mL Oral Q4H PRN Motley-Mangrum, Jadeka A, PMHNP       haloperidol  (HALDOL ) tablet 5 mg  5 mg Oral TID PRN Motley-Mangrum, Jadeka A, PMHNP       And   diphenhydrAMINE  (BENADRYL ) capsule 50 mg  50 mg Oral TID PRN Motley-Mangrum, Jadeka A, PMHNP       haloperidol  lactate (HALDOL ) injection 5 mg  5 mg Intramuscular TID PRN Motley-Mangrum, Jadeka A, PMHNP       And   diphenhydrAMINE  (BENADRYL ) injection 50 mg  50 mg Intramuscular TID PRN Motley-Mangrum, Jadeka A, PMHNP       And   LORazepam  (ATIVAN ) injection 2 mg  2 mg Intramuscular TID PRN Motley-Mangrum, Jadeka A, PMHNP       haloperidol  lactate (HALDOL ) injection 10 mg  10 mg Intramuscular TID PRN Motley-Mangrum, Jadeka A, PMHNP       And   diphenhydrAMINE  (BENADRYL ) injection 50 mg  50 mg Intramuscular TID PRN Motley-Mangrum, Jadeka A, PMHNP       And   LORazepam  (ATIVAN ) injection 2 mg  2 mg Intramuscular TID PRN Motley-Mangrum, Jadeka A, PMHNP       LORazepam  (ATIVAN ) tablet 1 mg  1 mg Oral TID AC Benjamin Tickner S,  DO   1 mg at 02/23/24 1135   magnesium  hydroxide (MILK OF MAGNESIA) suspension 30 mL  30 mL Oral Daily PRN Motley-Mangrum, Jadeka A, PMHNP       OLANZapine  zydis (ZYPREXA ) disintegrating tablet 5 mg  5 mg Oral QHS Benjamin Ostrom S, DO       traZODone  (DESYREL ) tablet 50 mg  50 mg Oral QHS PRN Benjamin Voong S, DO       PTA Medications: Medications Prior  to Admission  Medication Sig Dispense Refill Last Dose/Taking   clonazePAM  (KLONOPIN ) 0.5 MG tablet Take 1 tablet (0.5 mg total) by mouth every 12 (twelve) hours for 7 days. (Patient not taking: Reported on 02/22/2024) 14 tablet 0    lisdexamfetamine (VYVANSE ) 70 MG capsule Take 1 capsule (70 mg total) by mouth daily. (Patient not taking: Reported on 02/22/2024)      OLANZapine  (ZYPREXA ) 20 MG tablet Take 1 tablet (20 mg total) by mouth at bedtime. (Patient not taking: Reported on 02/22/2024) 30 tablet 0     AIMS:  ,  ,  ,  ,  ,  ,    Musculoskeletal: Strength & Muscle Tone: within normal limits Gait & Station: normal Patient leans: N/A            Psychiatric Specialty Exam:  Presentation  General Appearance:  Disheveled  Eye Contact: Poor  Speech: Clear and Coherent; Slow (minimal)  Speech Volume: Normal  Handedness: Right   Mood and Affect  Mood: -- (ok)  Affect: Constricted; Flat   Thought Process  Thought Processes: Linear  Duration of Psychotic Symptoms:a few weeks Past Diagnosis of Schizophrenia or Psychoactive disorder: No  Descriptions of Associations:Intact  Orientation:Full (Time, Place and Person)  Thought Content:Delusions  Hallucinations:Hallucinations: None  Ideas of Reference:Delusions  Suicidal Thoughts:Suicidal Thoughts: No  Homicidal Thoughts:Homicidal Thoughts: No   Sensorium  Memory: Immediate Fair  Judgment: Intact  Insight: Present   Executive Functions  Concentration: Fair  Attention Span: Fair  Recall: [employer] of  Knowledge: Fair  Language: Fair   Psychomotor Activity  Psychomotor Activity:Psychomotor Activity: Normal   Assets  Assets: Physical Health   Sleep  Sleep:Sleep: Fair  Estimated Sleeping Duration (Last 24 Hours): 6.00 hours   Physical Exam: Physical Exam Vitals and nursing note reviewed.  Constitutional:      General: He is not in acute distress.    Appearance: Normal appearance. He is normal weight. He is not ill-appearing or toxic-appearing.  HENT:     Head: Normocephalic and atraumatic.  Pulmonary:     Effort: Pulmonary effort is normal.  Musculoskeletal:        General: Normal range of motion.  Neurological:     General: No focal deficit present.     Mental Status: He is alert.    Review of Systems  Respiratory:  Negative for cough and shortness of breath.   Cardiovascular:  Negative for chest pain.  Gastrointestinal:  Positive for nausea. Negative for abdominal pain, constipation, diarrhea and vomiting.  Neurological:  Positive for dizziness and headaches. Negative for weakness.  Psychiatric/Behavioral:  Negative for depression, hallucinations and suicidal ideas. The patient is not nervous/anxious.    Blood pressure 109/77, pulse (!) 46, temperature 97.6 F (36.4 C), temperature source Oral, resp. rate 16, height 5' 10 (1.778 m), weight 78 kg, SpO2 100%. Body mass index is 24.68 kg/m.  Treatment Plan Summary: Daily contact with patient to assess and evaluate symptoms and progress in treatment and Medication management  Benjamin Benjamin [name] is a 43 yr old male who presented under IVC to [facility] on 7/21 due to concerns for fasting and not drinking or eating, he was admitted to [facility] on 7/22.  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    Jayshun is willing  to answer some questions and has moved around the unit some.  However, given his history of Catatonia and stopping all medications as soon as he was discharged we will restart scheduled Ativan .  We will continue Zyprexa  as this did help with his symptoms but will start it at a low dose to not worsen his Catatonia.   MDD, Recurrent, Severe, w/psychosis w/ Catatonic features: -Reduce Zyprexa  to 5 mg QHS for psychosis -Start Ativan  1 mg TID for Catatonia -Continue Agitation Protocol: Haldol /Ativan /Benadryl    -Continue PRN'Benjamin [name]: Tylenol , Maalox, Atarax , Milk of Magnesia, Trazodone    Observation Level/Precautions:  15 minute checks  Laboratory:  CMP: WNL except BUN:22,  CBC: WNL except  RBC: 6.07,  Hem: 12.7,  MCV: 73.8,  MCH: 20.9,  MCHC: 28.3,  RDW: 20.2,  UDS: THC positive, EKG: Sinus Rhythm w/ Qtc: 419 A1c(7/3): 5.4,  Lipid Panel(7/3): WNL except Trig: 155, HDL: 29  Psychotherapy:    Medications:  Zyprexa , Ativan   Consultations:    Discharge Concerns:    Estimated LOS: 10-14 days  Other:     Physician Treatment Plan for Primary Diagnosis: MDD (major depressive disorder), recurrent, severe, with psychosis (HCC) Long Term Goal(Benjamin [name]): Improvement in symptoms so as ready for discharge  Short Term Goals: Ability to identify changes in lifestyle to reduce recurrence of condition will improve, Ability to verbalize feelings will improve, Ability to identify and develop effective coping behaviors will improve, Ability to maintain clinical measurements within normal limits will improve, and Compliance with prescribed medications will improve  Physician Treatment Plan for Secondary Diagnosis: Principal Problem:   MDD (major depressive disorder), recurrent, severe, with psychosis (HCC) Active Problems:   Catatonic withdrawn type  Long Term Goal(Benjamin [name]): Improvement in symptoms so as ready for discharge  Short Term Goals: Ability to identify  changes in lifestyle to reduce recurrence of condition will improve, Ability to verbalize feelings will improve, Ability to identify and develop effective coping behaviors will improve, Ability to maintain clinical measurements within normal limits will improve, and Compliance with prescribed medications will improve  I certify that inpatient services furnished can reasonably be expected to improve the patient'Benjamin [name] condition.    Marsa GORMAN Rosser, DO 7/22/20251:27 PM

## 2024-02-23 NOTE — Progress Notes (Signed)
(  Sleep Hours) -6.5 as of 0530 (Any PRNs that were needed, meds refused, or side effects to meds)- Pt refused zyprexa  20mg  after admit (Any disturbances and when (visitation, over night)-none (Concerns raised by the patient)- none (SI/HI/AVH)- Denies all

## 2024-02-23 NOTE — [facility] Group Notes (Signed)
 Psychoeducational Group Note  Date:  02/23/2024 Time:  9:00  Group Topic/Focus:  Goals Group:   The focus of this group is to help patients establish daily goals to achieve during treatment and discuss how the patient can incorporate goal setting into their daily lives to aide in recovery.  Participation Level: Did Not Attend  Participation Quality:  Not Applicable  Affect:  Not Applicable  Cognitive:  Not Applicable  Insight:  Not Applicable  Engagement in Group: Not Applicable  Additional Comments:  Pt did not attend Goal group.  Candelario Steppe, [place] 02/23/2024, 10:25 AM

## 2024-02-23 NOTE — Plan of Care (Signed)
  Problem: Education: Goal: Emotional status will improve Outcome: Progressing Goal: Verbalization of understanding the information provided will improve Outcome: Progressing   Problem: Activity: Goal: Sleeping patterns will improve Outcome: Progressing   Problem: Coping: Goal: Ability to demonstrate self-control will improve Outcome: Progressing   Problem: Health Behavior/Discharge Planning: Goal: Compliance with treatment plan for underlying cause of condition will improve Outcome: Progressing

## 2024-02-24 ENCOUNTER — Encounter (HOSPITAL_COMMUNITY): Payer: Self-pay

## 2024-02-24 DIAGNOSIS — F333 Major depressive disorder, recurrent, severe with psychotic symptoms: Secondary | ICD-10-CM | POA: Diagnosis not present

## 2024-02-24 MED ORDER — LISDEXAMFETAMINE DIMESYLATE 10 MG PO CAPS
10.0000 mg | ORAL_CAPSULE | Freq: Every day | ORAL | Status: DC
  Administered 2024-02-25: 10 mg via ORAL
  Filled 2024-02-24: qty 1

## 2024-02-24 MED ORDER — OLANZAPINE 10 MG PO TBDP
10.0000 mg | ORAL_TABLET | Freq: Every day | ORAL | Status: DC
  Administered 2024-02-24: 10 mg via ORAL
  Filled 2024-02-24: qty 1

## 2024-02-24 NOTE — Group Note (Signed)
 Date:  02/24/2024 Time:  11:24 AM  Group Topic/Focus:  Goals Group:   The focus of this group is to help patients establish daily goals to achieve during treatment and discuss how the patient can incorporate goal setting into their daily lives to aide in recovery. Orientation:   The focus of this group is to educate the patient on the purpose and policies of crisis stabilization and provide a format to answer questions about their admission.  The group details unit policies and expectations of patients while admitted.    Participation Level:  Did Not Attend   Benjamin [name] 02/24/2024, 11:24 AM

## 2024-02-24 NOTE — Progress Notes (Signed)
 Psychoeducational Group Note  Date:  02/24/2024 Time:  2217  Group Topic/Focus:  Wrap-Up Group:   The focus of this group is to help patients review their daily goal of treatment and discuss progress on daily workbooks.  Participation Level: Did Not Attend  Participation Quality:  Not Applicable  Affect:  Not Applicable  Cognitive:  Not Applicable  Insight:  Not Applicable  Engagement in Group: Not Applicable  Additional Comments:  The patient did not attend the group this evening.   Linet Brash S 02/24/2024, 10:16 PM

## 2024-02-24 NOTE — Group Note (Signed)
 Recreation Therapy Group Note   Group Topic:Other  Group Date: 02/24/2024 Start Time: 1405 End Time: 1450 Facilitators: Lethia Donlon-McCall, LRT,CTRS Location: [address] Dayroom   Activity Description/Intervention: Therapeutic Drumming. Patients with peers and staff were given the opportunity to engage in a leader facilitated [facility] Empowerment Drumming Circle with staff from the [employer], in partnership with [employer]. Teaching laboratory technician and trained [employer], Norleen Mon leading with LRT observing and documenting intervention and pt response. This evidenced-based practice targets 7 areas of health and wellbeing in the human experience including: stress-reduction, exercise, self-expression, camaraderie/support, nurturing, spirituality, and music-making (leisure).   Goal Area(s) Addresses:  Patient will engage in pro-social way in music group.  Patient will follow directions of drum leader on the first prompt. Patient will demonstrate no behavioral issues during group.  Patient will identify if a reduction in stress level occurs as a result of participation in therapeutic drum circle.    Education: Leisure exposure, Pharmacologist, Musical expression, Discharge Planning   Affect/Mood: N/A   Participation Level: Did not attend    Clinical Observations/Individualized Feedback:      Plan: Continue to engage patient in RT group sessions 2-3x/week.   Marigny Borre-McCall, LRT,CTRS  02/24/2024 4:12 PM

## 2024-02-24 NOTE — [facility] Group Notes (Signed)

## 2024-02-24 NOTE — BH IP Treatment Plan (Signed)
 Interdisciplinary Treatment and Diagnostic Plan Update  02/24/2024 Time of Session: 10:25 AM  Benjamin [name] MRN: [id number]  Principal Diagnosis: MDD (major depressive disorder), recurrent, severe, with psychosis (HCC)  Secondary Diagnoses: Principal Problem:   MDD (major depressive disorder), recurrent, severe, with psychosis (HCC) Active Problems:   Catatonic withdrawn type   Current Medications:  Current Facility-Administered Medications  Medication Dose Route Frequency Provider Last Rate Last Admin   acetaminophen  (TYLENOL ) tablet 650 mg  650 mg Oral Q6H PRN Motley-Mangrum, Jadeka A, PMHNP       alum & mag hydroxide-simeth (MAALOX/MYLANTA) 200-200-20 MG/5ML suspension 30 mL  30 mL Oral Q4H PRN Motley-Mangrum, Jadeka A, PMHNP       haloperidol  (HALDOL ) tablet 5 mg  5 mg Oral TID PRN Motley-Mangrum, Jadeka A, PMHNP       And   diphenhydrAMINE  (BENADRYL ) capsule 50 mg  50 mg Oral TID PRN Motley-Mangrum, Jadeka A, PMHNP       haloperidol  lactate (HALDOL ) injection 5 mg  5 mg Intramuscular TID PRN Motley-Mangrum, Jadeka A, PMHNP       And   diphenhydrAMINE  (BENADRYL ) injection 50 mg  50 mg Intramuscular TID PRN Motley-Mangrum, Jadeka A, PMHNP       And   LORazepam  (ATIVAN ) injection 2 mg  2 mg Intramuscular TID PRN Motley-Mangrum, Jadeka A, PMHNP       haloperidol  lactate (HALDOL ) injection 10 mg  10 mg Intramuscular TID PRN Motley-Mangrum, Jadeka A, PMHNP       And   diphenhydrAMINE  (BENADRYL ) injection 50 mg  50 mg Intramuscular TID PRN Motley-Mangrum, Jadeka A, PMHNP       And   LORazepam  (ATIVAN ) injection 2 mg  2 mg Intramuscular TID PRN Motley-Mangrum, Jadeka A, PMHNP       feeding supplement (ENSURE PLUS HIGH PROTEIN) liquid 237 mL  237 mL Oral BID BM Pashayan, Alexander S, DO   237 mL at 02/24/24 1437   [START ON 02/25/2024] lisdexamfetamine (VYVANSE ) capsule 10 mg  10 mg Oral Daily Pashayan, Alexander S, DO       LORazepam  (ATIVAN ) tablet 1 mg  1 mg Oral TID AC Pashayan,  Alexander S, DO   1 mg at 02/24/24 1640   magnesium  hydroxide (MILK OF MAGNESIA) suspension 30 mL  30 mL Oral Daily PRN Motley-Mangrum, Jadeka A, PMHNP       OLANZapine  zydis (ZYPREXA ) disintegrating tablet 10 mg  10 mg Oral QHS Pashayan, Alexander S, DO       traZODone  (DESYREL ) tablet 50 mg  50 mg Oral QHS PRN Pashayan, Marsa RAMAN, DO       PTA Medications: Medications Prior to Admission  Medication Sig Dispense Refill Last Dose/Taking   clonazePAM  (KLONOPIN ) 0.5 MG tablet Take 1 tablet (0.5 mg total) by mouth every 12 (twelve) hours for 7 days. (Patient not taking: Reported on 02/22/2024) 14 tablet 0    lisdexamfetamine (VYVANSE ) 70 MG capsule Take 1 capsule (70 mg total) by mouth daily. (Patient not taking: Reported on 02/22/2024)      OLANZapine  (ZYPREXA ) 20 MG tablet Take 1 tablet (20 mg total) by mouth at bedtime. (Patient not taking: Reported on 02/22/2024) 30 tablet 0     Patient Stressors:    Patient Strengths:    Treatment Modalities: Medication Management, Group therapy, Case management,  1 to 1 session with clinician, Psychoeducation, Recreational therapy.   Physician Treatment Plan for Primary Diagnosis: MDD (major depressive disorder), recurrent, severe, with psychosis (HCC) Long Term Goal(s): Improvement in symptoms so  as ready for discharge   Short Term Goals: Ability to identify changes in lifestyle to reduce recurrence of condition will improve Ability to verbalize feelings will improve Ability to identify and develop effective coping behaviors will improve Ability to maintain clinical measurements within normal limits will improve Compliance with prescribed medications will improve  Medication Management: Evaluate patient's response, side effects, and tolerance of medication regimen.  Therapeutic Interventions: 1 to 1 sessions, Unit Group sessions and Medication administration.  Evaluation of Outcomes: Not Progressing  Physician Treatment Plan for Secondary  Diagnosis: Principal Problem:   MDD (major depressive disorder), recurrent, severe, with psychosis (HCC) Active Problems:   Catatonic withdrawn type  Long Term Goal(s): Improvement in symptoms so as ready for discharge   Short Term Goals: Ability to identify changes in lifestyle to reduce recurrence of condition will improve Ability to verbalize feelings will improve Ability to identify and develop effective coping behaviors will improve Ability to maintain clinical measurements within normal limits will improve Compliance with prescribed medications will improve     Medication Management: Evaluate patient's response, side effects, and tolerance of medication regimen.  Therapeutic Interventions: 1 to 1 sessions, Unit Group sessions and Medication administration.  Evaluation of Outcomes: Not Progressing   RN Treatment Plan for Primary Diagnosis: MDD (major depressive disorder), recurrent, severe, with psychosis (HCC) Long Term Goal(s): Knowledge of disease and therapeutic regimen to maintain health will improve  Short Term Goals: Ability to remain free from injury will improve, Ability to verbalize frustration and anger appropriately will improve, Ability to demonstrate self-control, Ability to participate in decision making will improve, Ability to verbalize feelings will improve, Ability to disclose and discuss suicidal ideas, Ability to identify and develop effective coping behaviors will improve, and Compliance with prescribed medications will improve  Medication Management: RN will administer medications as ordered by provider, will assess and evaluate patient's response and provide education to patient for prescribed medication. RN will report any adverse and/or side effects to prescribing provider.  Therapeutic Interventions: 1 on 1 counseling sessions, Psychoeducation, Medication administration, Evaluate responses to treatment, Monitor vital signs and CBGs as ordered,  Perform/monitor CIWA, COWS, AIMS and Fall Risk screenings as ordered, Perform wound care treatments as ordered.  Evaluation of Outcomes: Not Progressing   LCSW Treatment Plan for Primary Diagnosis: MDD (major depressive disorder), recurrent, severe, with psychosis (HCC) Long Term Goal(s): Safe transition to appropriate next level of care at discharge, Engage patient in therapeutic group addressing interpersonal concerns.  Short Term Goals: Engage patient in aftercare planning with referrals and resources, Increase social support, Increase ability to appropriately verbalize feelings, Increase emotional regulation, Facilitate acceptance of mental health diagnosis and concerns, Facilitate patient progression through stages of change regarding substance use diagnoses and concerns, Identify triggers associated with mental health/substance abuse issues, and Increase skills for wellness and recovery  Therapeutic Interventions: Assess for all discharge needs, 1 to 1 time with Social worker, Explore available resources and support systems, Assess for adequacy in community support network, Educate family and significant other(s) on suicide prevention, Complete Psychosocial Assessment, Interpersonal group therapy.  Evaluation of Outcomes: Not Progressing   Progress in Treatment: Attending groups: No. Participating in groups: No. Taking medication as prescribed: Yes. Toleration medication: Yes. Family/Significant other contact made: patient declined consents Patient understands diagnosis: Yes. Discussing patient identified problems/goals with staff: Yes. Medical problems stabilized or resolved: Yes. Denies suicidal/homicidal ideation: Yes. Issues/concerns per patient self-inventory: No.  New problem(s) identified:  No  New Short Term/Long Term Goal(s):  Patient  Goals:  I want to get better and gain some weight after fasting.   Discharge Plan or Barriers:  Patient recently admitted. CSW will  continue to follow and assess for appropriate referrals and possible discharge planning.   Reason for Continuation of Hospitalization: Depression Medication stabilization  Estimated Length of Stay:  5 - 7 days  Last 3 [place] Suicide Severity Risk Score: Flowsheet Row [facility]Current[facility] Most recent reading at 02/22/2024 11:00 PM ED from 02/22/2024 in [facility] Emergency Department at [facility] Most recent reading at 02/22/2024  9:27 AM Admission (Discharged) [facility] Most recent reading at 02/02/2024  9:20 PM  C-SSRS RISK CATEGORY No Risk No Risk Error: Question 2 not populated    Last PHQ 2/9 Scores:     No data to display          Scribe for Treatment Team: Mersades Barbaro O Thaily Hackworth, LCSWA 02/24/2024 6:05 PM

## 2024-02-24 NOTE — Progress Notes (Signed)
 [facility] MD Progress Note  02/24/2024 1:13 PM Benjamin [name]  MRN:  [id number] Subjective:   Benjamin [name] is a 43 yr old male who presented under IVC to [facility] on 7/21 due to concerns for fasting and not drinking or eating, he was admitted to [facility] on 7/22.  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    Case was discussed in the multidisciplinary team. MAR was reviewed and patient was compliant with medications.  He did not receive any PRN medications yesterday.   Psychiatric Team made the following recommendations yesterday: -Reduce Zyprexa  to 5 mg QHS for psychosis -Start Ativan  1 mg TID for Catatonia -Continue Agitation Protocol: Haldol /Ativan /Benadryl     On interview today patient reports he slept good last night.  He reports his appetite is doing good.  He reports no SI, HI, or AVH.  He reports no Paranoia or Ideas of Reference.  He reports no issues with his medications.  He reports that after his first dose of Ativan  he started eating again.  He reports that yesterday when he was hearing the room [facility] tourn on it was telling him to not eat but that this has gone away.  He reports that he called his family and he knows that he scared his son with how sick he got.   Discussed with him the importance of taking his medications and he reported understanding.  He reports no other concerns at present.   Principal Problem: MDD (major depressive disorder), recurrent, severe, with psychosis (HCC) Diagnosis: Principal Problem:   MDD (major depressive disorder), recurrent, severe, with psychosis (HCC) Active Problems:   Catatonic withdrawn type  Total Time spent with patient:  I personally spent 35 minutes on the unit in direct  patient care. The direct patient care time included face-to-face time with the patient, reviewing the patient's chart, communicating with other professionals, and coordinating care. Greater than 50% of this time was spent in counseling or coordinating care with the patient regarding goals of hospitalization, psycho-education, and discharge planning needs.   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   Past Medical History:  Past Medical History:  Diagnosis Date   Depression    Hematemesis 08/2017   Herniated disc    x3   Hypertension    Mental disorder     Past Surgical History:  Procedure Laterality Date   ESOPHAGOGASTRODUODENOSCOPY Left 08/27/2017   Procedure: ESOPHAGOGASTRODUODENOSCOPY (EGD);  Surgeon: Rollin Dover, MD;  Location: [facility] ENDOSCOPY;  Service: Endoscopy;  Laterality: Left;   SHOULDER SURGERY     WISDOM TOOTH EXTRACTION     Family History:  Family History  Problem Relation Age of Onset   Seizures Mother    Stroke Mother    Diabetes Mellitus II Mother    Family Psychiatric  History:  Father- Suicide Brother- Depression  Social History:  Social History   Substance and Sexual Activity  Alcohol Use No     Social History   Substance and Sexual Activity  Drug Use Yes   Types: Marijuana    Social History   Socioeconomic History  Marital status: Single    Spouse name: Not on file   Number of children: Not on file   Years of education: Not on file   Highest education level: Not on file  Occupational History   Not on file  Tobacco Use   Smoking status: Former    Current packs/day: 0.00    Types: Cigarettes    Quit date: 04/04/2012    Years since quitting: 11.8   Smokeless tobacco:  Never   Tobacco comments:    Pt is nonverbal. Unknown.   Vaping Use   Vaping status: Never Used  Substance and Sexual Activity   Alcohol use: No   Drug use: Yes    Types: Marijuana   Sexual activity: Never  Other Topics Concern   Not on file  Social History Narrative   Not on file   Social Drivers of Health   Financial Resource Strain: Not on file  Food Insecurity: Patient Declined (02/22/2024)   Hunger Vital Sign    Worried About Running Out of Food in the Last Year: Patient declined    Ran Out of Food in the Last Year: Patient declined  Recent Concern: Food Insecurity - Food Insecurity Present (01/28/2024)   Hunger Vital Sign    Worried About Running Out of Food in the Last Year: Sometimes true    Ran Out of Food in the Last Year: Sometimes true  Transportation Needs: No Transportation Needs (02/22/2024)   PRAPARE - Administrator, Civil Service (Medical): No    Lack of Transportation (Non-Medical): No  Physical Activity: Not on file  Stress: Not on file (06/11/2023)  Social Connections: Unknown (01/28/2024)   Social Connection and Isolation Panel    Frequency of Communication with Friends and Family: Patient unable to answer    Frequency of Social Gatherings with Friends and Family: Patient unable to answer    Attends Religious Services: Patient unable to answer    Active Member of Clubs or Organizations: Patient unable to answer    Attends Banker Meetings: Patient unable to answer    Marital Status: Married   Additional Social History:                         Sleep: Good Estimated Sleeping Duration (Last 24 Hours): 7.75 hours  Appetite:  Good  Current Medications: Current Facility-Administered Medications  Medication Dose Route Frequency Provider Last Rate Last Admin   acetaminophen  (TYLENOL ) tablet 650 mg  650 mg Oral Q6H PRN Motley-Mangrum, Jadeka A, PMHNP       alum & mag hydroxide-simeth (MAALOX/MYLANTA) 200-200-20 MG/5ML  suspension 30 mL  30 mL Oral Q4H PRN Motley-Mangrum, Jadeka A, PMHNP       haloperidol  (HALDOL ) tablet 5 mg  5 mg Oral TID PRN Motley-Mangrum, Jadeka A, PMHNP       And   diphenhydrAMINE  (BENADRYL ) capsule 50 mg  50 mg Oral TID PRN Motley-Mangrum, Jadeka A, PMHNP       haloperidol  lactate (HALDOL ) injection 5 mg  5 mg Intramuscular TID PRN Motley-Mangrum, Jadeka A, PMHNP       And   diphenhydrAMINE  (BENADRYL ) injection 50 mg  50 mg Intramuscular TID PRN Motley-Mangrum, Jadeka A, PMHNP       And   LORazepam  (ATIVAN ) injection 2 mg  2 mg Intramuscular TID PRN Motley-Mangrum, Jadeka A, PMHNP       haloperidol  lactate (HALDOL ) injection 10 mg  10 mg Intramuscular TID PRN Motley-Mangrum,  Cathaleen LABOR, PMHNP       And   diphenhydrAMINE  (BENADRYL ) injection 50 mg  50 mg Intramuscular TID PRN Motley-Mangrum, Jadeka A, PMHNP       And   LORazepam  (ATIVAN ) injection 2 mg  2 mg Intramuscular TID PRN Motley-Mangrum, Jadeka A, PMHNP       feeding supplement (ENSURE PLUS HIGH PROTEIN) liquid 237 mL  237 mL Oral BID BM Myeisha Kruser GORMAN, DO   237 mL at 02/24/24 1012   [START ON 02/25/2024] lisdexamfetamine (VYVANSE ) capsule 10 mg  10 mg Oral Daily Jared Whorley S, DO       LORazepam  (ATIVAN ) tablet 1 mg  1 mg Oral TID AC Johnaton Sonneborn S, DO   1 mg at 02/24/24 1130   magnesium  hydroxide (MILK OF MAGNESIA) suspension 30 mL  30 mL Oral Daily PRN Motley-Mangrum, Jadeka A, PMHNP       OLANZapine  zydis (ZYPREXA ) disintegrating tablet 10 mg  10 mg Oral QHS Wentworth Edelen S, DO       traZODone  (DESYREL ) tablet 50 mg  50 mg Oral QHS PRN Raliegh Marsa GORMAN, DO        Lab Results:  Results for orders placed or performed during the hospital encounter of 02/22/24 (from the past 48 hours)  Urine rapid drug screen (hosp performed)     Status: Abnormal   Collection Time: 02/22/24  6:29 PM  Result Value Ref Range   Opiates NONE DETECTED NONE DETECTED   Cocaine NONE DETECTED NONE DETECTED    Benzodiazepines NONE DETECTED NONE DETECTED   Amphetamines NONE DETECTED NONE DETECTED   Tetrahydrocannabinol POSITIVE (A) NONE DETECTED   Barbiturates NONE DETECTED NONE DETECTED    Comment: (NOTE) DRUG SCREEN FOR MEDICAL PURPOSES ONLY.  IF CONFIRMATION IS NEEDED FOR ANY PURPOSE, NOTIFY LAB WITHIN 5 DAYS.  LOWEST DETECTABLE LIMITS FOR URINE DRUG SCREEN Drug Class                     Cutoff (ng/mL) Amphetamine and metabolites    1000 Barbiturate and metabolites    200 Benzodiazepine                 200 Opiates and metabolites        300 Cocaine and metabolites        300 THC                            50 Performed at [facility], [address] [place], [place] [address]     Blood Alcohol level:  Lab Results  Component Value Date   [facility] <15 02/22/2024   ETH <15 01/28/2024    Metabolic Disorder Labs: Lab Results  Component Value Date   HGBA1C 5.4 02/04/2024   MPG 108 02/04/2024   MPG 103 07/15/2016   No results found for: PROLACTIN Lab Results  Component Value Date   CHOL 153 02/04/2024   TRIG 155 (H) 02/04/2024   HDL 29 (L) 02/04/2024   CHOLHDL 5.3 02/04/2024   VLDL 31 02/04/2024   LDLCALC 93 02/04/2024   LDLCALC 140 (H) 07/15/2016    Physical Findings: AIMS:  ,  ,  ,  ,  ,  ,   CIWA:    COWS:     Musculoskeletal: Strength & Muscle Tone: within normal limits Gait & Station: normal Patient leans: N/A  Psychiatric Specialty Exam:  Presentation  General Appearance:  Casual  Eye  Contact: Fair  Speech: Clear and Coherent; Normal Rate  Speech Volume: Normal  Handedness: Right   Mood and Affect  Mood: Dysphoric  Affect: Congruent; Appropriate   Thought Process  Thought Processes: Coherent; Goal Directed  Descriptions of Associations:Intact  Orientation:Full (Time, Place and Person)  Thought Content:Logical; WDL  History of Schizophrenia/Schizoaffective disorder:No  Duration of Psychotic Symptoms:Less  than six months  Hallucinations:Hallucinations: None  Ideas of Reference:None  Suicidal Thoughts:Suicidal Thoughts: No  Homicidal Thoughts:Homicidal Thoughts: No   Sensorium  Memory: Immediate Fair  Judgment: Fair  Insight: Fair   Art therapist  Concentration: Fair  Attention Span: Fair  Recall: [employer] of Knowledge: Fair  Language: Fair   Psychomotor Activity  Psychomotor Activity: Psychomotor Activity: Normal   Assets  Assets: Physical Health; Resilience   Sleep  Sleep: Sleep: Good    Physical Exam: Physical Exam Vitals and nursing note reviewed.  Constitutional:      General: He is not in acute distress.    Appearance: Normal appearance. He is normal weight. He is not ill-appearing or toxic-appearing.  HENT:     Head: Normocephalic and atraumatic.  Pulmonary:     Effort: Pulmonary effort is normal.  Musculoskeletal:        General: Normal range of motion.  Neurological:     General: No focal deficit present.     Mental Status: He is alert.    Review of Systems  Respiratory:  Negative for cough and shortness of breath.   Cardiovascular:  Negative for chest pain.  Gastrointestinal:  Negative for abdominal pain, constipation, diarrhea, nausea and vomiting.  Neurological:  Negative for dizziness, weakness and headaches.  Psychiatric/Behavioral:  Negative for depression, hallucinations and suicidal ideas. The patient is not nervous/anxious.    Blood pressure 90/68, pulse 74, temperature 97.8 F (36.6 C), temperature source Oral, resp. rate 16, height 5' 10 (1.778 m), weight 78 kg, SpO2 100%. Body mass index is 24.68 kg/m.   Treatment Plan Summary: Daily contact with patient to assess and evaluate symptoms and progress in treatment and Medication management  Benjamin [name] is a 43 yr old male who presented under IVC to [facility] on 7/21 due to concerns for fasting and not drinking or eating, he was admitted to [facility] on 7/22.  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    Benjamin [name] has responded well to the Ativan  as after his first dose he began eating again and interacting with others on the unit.  Today he is reporting improvement.  We will further increase his Zyprexa  this evening.  We will decrease his Ativan  tomorrow.  We will also restart his Vyvanse  at low dose tomorrow.  We will not make any other changes to his medications at this time.  We will continue to monitor.    MDD, Recurrent, Severe, w/psychosis w/ Catatonic features: -Increase Zyprexa  to 10 mg QHS for psychosis -Continue Ativan  1 mg TID for Catatonia -Continue Agitation Protocol: Haldol /Ativan /Benadryl      -Continue PRN's: Tylenol , Maalox, Atarax , Milk of Magnesia, Trazodone    --  The risks/benefits/side-effects/alternatives to medications were discussed in detail with the patient and time was given for questions. The patient consents to medication trials.                -- Metabolic  profile and EKG monitoring obtained while on an atypical antipsychotic (BMI:24.68 CMP: WNL except BUN:22,  CBC: WNL except  RBC: 6.07,  Hem: 12.7,  MCV: 73.8,  MCH: 20.9,  MCHC: 28.3,  RDW: 20.2,  UDS: THC positive, EKG: Sinus Rhythm w/ Qtc: 419 A1c(7/3): 5.4,  Lipid Panel(7/3): WNL except Trig: 155, HDL: 29              -- Encouraged patient to participate in unit milieu and in scheduled group therapies              -- Short Term Goals: Ability to identify changes in lifestyle to reduce recurrence of condition will improve, Ability to verbalize feelings will improve, Ability to disclose and discuss suicidal ideas, Ability to demonstrate self-control will improve, Ability to identify and develop effective  coping behaviors will improve, Ability to maintain clinical measurements within normal limits will improve, Compliance with prescribed medications will improve, and Ability to identify triggers associated with substance abuse/mental health issues will improve             -- Long Term Goals: Improvement in symptoms so as ready for discharge   Safety and Monitoring:             -- Involuntary admission to inpatient psychiatric unit for safety, stabilization and treatment             -- Daily contact with patient to assess and evaluate symptoms and progress in treatment             -- Patient's case to be discussed in multi-disciplinary team meeting             -- Observation Level : q15 minute checks             -- Vital signs:  q12 hours             -- Precautions: suicide, elopement, and assault  Discharge Planning:              -- Social work and case management to assist with discharge planning and identification of hospital follow-up needs prior to discharge             -- Estimated LOS: 5-7 more days             -- Discharge Concerns: Need to establish a safety plan; Medication compliance and effectiveness             -- Discharge Goals: Return home with outpatient referrals for mental health follow-up including medication management/psychotherapy   Marsa GORMAN Rosser, DO 02/24/2024, 1:13 PM

## 2024-02-24 NOTE — Plan of Care (Signed)
  Problem: Education: Goal: Emotional status will improve Outcome: Progressing Goal: Verbalization of understanding the information provided will improve Outcome: Progressing   Problem: Activity: Goal: Sleeping patterns will improve Outcome: Progressing   Problem: Coping: Goal: Ability to verbalize frustrations and anger appropriately will improve Outcome: Progressing

## 2024-02-24 NOTE — Plan of Care (Signed)
  Problem: Education: Goal: Knowledge of [facility] Education information/materials will improve Outcome: Progressing Goal: Emotional status will improve Outcome: Progressing Goal: Mental status will improve Outcome: Progressing Goal: Verbalization of understanding the information provided will improve Outcome: Progressing   Problem: Activity: Goal: Interest or engagement in activities will improve Outcome: Progressing   Problem: Health Behavior/Discharge Planning: Goal: Identification of resources available to assist in meeting health care needs will improve Outcome: Progressing Goal: Compliance with treatment plan for underlying cause of condition will improve Outcome: Progressing   Problem: Physical Regulation: Goal: Ability to maintain clinical measurements within normal limits will improve Outcome: Progressing   Problem: Safety: Goal: Periods of time without injury will increase Outcome: Progressing

## 2024-02-24 NOTE — BH Assessment (Signed)
(  Sleep Hours) - 7.75 (Any PRNs that were needed, meds refused, or side effects to meds)- None (Any disturbances and when (visitation, over night)-None noted (Concerns raised by the patient)- None noted (SI/HI/AVH)- Denies

## 2024-02-24 NOTE — Progress Notes (Signed)
   02/24/24 0800  Psych Admission Type (Psych Patients Only)  Admission Status Involuntary  Psychosocial Assessment  Patient Complaints Worrying;Anxiety  Eye Contact Fair  Facial Expression Animated  Affect Appropriate to circumstance  Speech Logical/coherent  Interaction Assertive  Motor Activity Other (Comment) (WNL)  Appearance/Hygiene In scrubs  Behavior Characteristics Cooperative;Appropriate to situation  Mood Pleasant  Thought Process  Coherency Blocking  Content WDL  Delusions None reported or observed  Perception WDL  Hallucination None reported or observed  Judgment Limited  Confusion None  Danger to Self  Current suicidal ideation? Denies  Agreement Not to Harm Self Yes  Description of Agreement verbal  Danger to Others  Danger to Others None reported or observed

## 2024-02-24 NOTE — Progress Notes (Signed)
   02/23/24 2130  Psych Admission Type (Psych Patients Only)  Admission Status Involuntary  Psychosocial Assessment  Patient Complaints Worrying;Tension  Eye Contact Fair  Facial Expression Flat  Affect Appropriate to circumstance  Speech Logical/coherent  Interaction Evasive  Motor Activity Other (Comment) (WNL)  Appearance/Hygiene Disheveled  Behavior Characteristics Appropriate to situation;Calm  Mood Pleasant  Thought Process  Coherency Unable to assess  Content WDL  Delusions None reported or observed  Perception WDL  Hallucination None reported or observed  Judgment Poor  Confusion None  Danger to Self  Current suicidal ideation? Denies (Denies)  Agreement Not to Harm Self Yes  Description of Agreement Notify Staff  Danger to Others  Danger to Others None reported or observed

## 2024-02-25 DIAGNOSIS — F333 Major depressive disorder, recurrent, severe with psychotic symptoms: Secondary | ICD-10-CM | POA: Diagnosis not present

## 2024-02-25 MED ORDER — LORAZEPAM 0.5 MG PO TABS
0.5000 mg | ORAL_TABLET | Freq: Three times a day (TID) | ORAL | Status: DC
  Administered 2024-02-25 – 2024-02-27 (×6): 0.5 mg via ORAL
  Filled 2024-02-25 (×6): qty 1

## 2024-02-25 MED ORDER — FAMOTIDINE 20 MG PO TABS
20.0000 mg | ORAL_TABLET | Freq: Every day | ORAL | Status: DC
  Administered 2024-02-25 – 2024-02-28 (×4): 20 mg via ORAL
  Filled 2024-02-25 (×4): qty 1

## 2024-02-25 MED ORDER — OLANZAPINE 15 MG PO TBDP
7.5000 mg | ORAL_TABLET | Freq: Every day | ORAL | Status: DC
  Administered 2024-02-25 – 2024-02-29 (×5): 7.5 mg via ORAL
  Filled 2024-02-25 (×5): qty 1

## 2024-02-25 NOTE — Plan of Care (Signed)

## 2024-02-25 NOTE — Group Note (Signed)
 LCSW Group Therapy Note   Group Date: 02/25/2024 Start Time: 1100 End Time: 1200   Participation:  patient was present and actively participated in the discussion  Type of Therapy:  Group Therapy  Topic:  Understanding Your Path to Change  Objective:  The goal is to help individuals understand the stages of change, identify where they currently are in the process, and provide actionable next steps to continue moving forward in their journey of change.  Goals: Learn about the six stages of change:  Precontemplation, Contemplation, Preparation, Action, Maintenance, and Relapse Reflect on Current Change Efforts:  Recognize which stage participants are in regarding a personal change. Plan Next Steps for Moving Forward:  Create an action plan based on their current stage of change.  Class Summary: In this session, we explored the Stages of Change as a framework to understand the process of change.  We discussed how each stage helps individuals recognize where they are in their personal journey and used the Stages of Change Worksheet for self-reflection. Participants answered questions to better understand their current stage, challenges, and progress. We also emphasized the importance of moving forward, even if setbacks (Relapse) occur, and created actionable steps to help participants continue progressing. By the end of the session, participants gained a clearer understanding of their path to change and left with a clear plan for next steps.  Modalities:  Elements of CBT (cognitive restructuring, problem solving)  Element of DBT (mindfulness, distress tolerance)   Soloman Mckeithan O Pheonix Clinkscale, LCSWA 02/25/2024  4:36 PM

## 2024-02-25 NOTE — Progress Notes (Addendum)
 D. Pt has been friendly during interactions, reported improved mood today and is asking when he will be able to go home. Pt has been visible in the milieu, observed interacting well with peers. Pt currently denies SI/HI and AVH  A. Labs and vitals monitored. Pt given and educated on medications. Pt supported emotionally and encouraged to express concerns and ask questions.   R. Pt remains safe with 15 minute checks. Will continue POC.    02/25/24 0800  Psych Admission Type (Psych Patients Only)  Admission Status Involuntary  Psychosocial Assessment  Patient Complaints None  Eye Contact Fair  Facial Expression Animated  Affect Appropriate to circumstance  Speech Logical/coherent  Interaction Assertive  Motor Activity Slow  Appearance/Hygiene Unremarkable  Behavior Characteristics Cooperative;Appropriate to situation  Mood Pleasant  Thought Process  Coherency WDL  Content WDL  Delusions None reported or observed  Perception WDL  Hallucination None reported or observed  Judgment Poor  Confusion None  Danger to Self  Current suicidal ideation? Denies  Agreement Not to Harm Self Yes  Description of Agreement agreed to contact staff before acting on harmful thoughts  Danger to Others  Danger to Others None reported or observed

## 2024-02-25 NOTE — Progress Notes (Signed)
   02/24/24 2300  Psych Admission Type (Psych Patients Only)  Admission Status Involuntary  Psychosocial Assessment  Patient Complaints None  Eye Contact Fair  Facial Expression Animated  Affect Appropriate to circumstance  Speech Logical/coherent  Interaction Assertive  Motor Activity Slow  Appearance/Hygiene In scrubs  Behavior Characteristics Cooperative;Appropriate to situation  Mood Pleasant  Thought Process  Coherency WDL  Content WDL  Delusions None reported or observed  Perception WDL  Hallucination None reported or observed  Judgment Poor  Confusion None  Danger to Self  Current suicidal ideation? Denies  Description of Suicide Plan none  Agreement Not to Harm Self Yes  Description of Agreement verbal  Danger to Others  Danger to Others None reported or observed

## 2024-02-25 NOTE — Group Note (Signed)
 Date:  02/25/2024 Time:  9:41 PM  Group Topic/Focus:  Wrap-Up Group:   The focus of this group is to help patients review their daily goal of treatment and discuss progress on daily workbooks.    Participation Level:  Active  Participation Quality:  Appropriate  Affect:  Appropriate  Cognitive:  Appropriate  Insight: Appropriate  Engagement in Group:  Engaged  Modes of Intervention:  Education and Exploration  Additional Comments:  Patient attended and participated in group tonight. He reports that his goal today was to stay awake. He did accomplish his goal.  Jasmynn Pfalzgraf Dacosta 02/25/2024, 9:41 PM

## 2024-02-25 NOTE — Plan of Care (Signed)

## 2024-02-25 NOTE — Progress Notes (Signed)
(  Sleep Hours) - 7.25 (Any PRNs that were needed, meds refused, or side effects to meds)- Pt eating, drinking and taking his meds. (Any disturbances and when (visitation, over night)- none (Concerns raised by the patient)-  (SI/HI/AVH)- denied

## 2024-02-25 NOTE — Progress Notes (Signed)
   02/25/24 2000  Psych Admission Type (Psych Patients Only)  Admission Status Involuntary  Psychosocial Assessment  Patient Complaints None  Eye Contact Fair  Facial Expression Animated  Affect Appropriate to circumstance  Speech Logical/coherent  Interaction Assertive  Motor Activity Slow  Appearance/Hygiene Unremarkable  Behavior Characteristics Cooperative  Mood Anxious  Aggressive Behavior  Effect No apparent injury  Thought Process  Coherency WDL  Content WDL  Delusions None reported or observed  Perception WDL  Hallucination None reported or observed  Judgment Limited  Confusion None  Danger to Self  Current suicidal ideation? Denies   (Sleep Hours) -8.5 (Any PRNs that were needed, meds refused, or side effects to meds)- none (Any disturbances and when (visitation, over night)-none (Concerns raised by the patient)- none (SI/HI/AVH)-denied

## 2024-02-25 NOTE — Group Note (Unsigned)
 Date:  02/25/2024 Time:  8:52 PM  Group Topic/Focus:  Wrap-Up Group:   The focus of this group is to help patients review their daily goal of treatment and discuss progress on daily workbooks.     Participation Level:  {BHH PARTICIPATION OZCZO:77735}  Participation Quality:  {BHH PARTICIPATION QUALITY:22265}  Affect:  {BHH AFFECT:22266}  Cognitive:  {BHH COGNITIVE:22267}  Insight: {BHH Insight2:20797}  Engagement in Group:  {BHH ENGAGEMENT IN HMNLE:77731}  Modes of Intervention:  {BHH MODES OF INTERVENTION:22269}  Additional Comments:  ***  Gwenn Nobie [place] 02/25/2024, 8:52 PM

## 2024-02-25 NOTE — Progress Notes (Signed)
 [facility] MD Progress Note  02/25/2024 10:42 AM Benjamin [name]  MRN:  [id number] Subjective:   Benjamin [name] is a 43 yr old male who presented under IVC to [facility] on 7/21 due to concerns for fasting and not drinking or eating, he was admitted to [facility] on 7/22.  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    Case was discussed in the multidisciplinary team. MAR was reviewed and patient was compliant with medications.  He did not require any PRN medications yesterday.   Psychiatric Team made the following recommendations yesterday: -Increase Zyprexa  to 10 mg QHS for psychosis -Continue Ativan  1 mg TID for Catatonia     On interview today patient reports he slept good last night.  He reports his appetite is doing good.  He reports no SI, HI, or AVH.  He reports no Paranoia or Ideas of Reference.  He reports mild issues with his medications.  He reports that the Zyprexa  is making him very tired during the day.   Discussed with him that we could lower the Zyprexa  so that he would not be as tired during the day.  Discussed we would also begin to reduce his Ativan  as his catatonia is resolving.  Discussed that we would start Vyvanse  very low dose because we did not want to unbalance him but that as tolerated we would further increase it and he was agreeable with this.  He reports no other concerns at present.    Principal Problem: MDD (major depressive disorder), recurrent, severe, with psychosis (HCC) Diagnosis: Principal Problem:   MDD (major depressive disorder), recurrent, severe, with psychosis (HCC) Active Problems:   Catatonic withdrawn type  Total Time spent with patient:  I personally spent 35 minutes on the unit in direct  patient care. The direct patient care time included face-to-face time with the patient, reviewing the patient's chart, communicating with other professionals, and coordinating care. Greater than 50% of this time was spent in counseling or coordinating care with the patient regarding goals of hospitalization, psycho-education, and discharge planning needs.   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   Past Medical History:  Past Medical History:  Diagnosis Date   Depression    Hematemesis 08/2017   Herniated disc    x3   Hypertension    Mental disorder     Past Surgical History:  Procedure Laterality Date   ESOPHAGOGASTRODUODENOSCOPY Left 08/27/2017   Procedure: ESOPHAGOGASTRODUODENOSCOPY (EGD);  Surgeon: Rollin Dover, MD;  Location: [facility] ENDOSCOPY;  Service: Endoscopy;  Laterality: Left;   SHOULDER SURGERY     WISDOM TOOTH EXTRACTION     Family History:  Family History  Problem Relation Age of Onset   Seizures Mother    Stroke Mother    Diabetes Mellitus II Mother    Family Psychiatric  History:  Father- Suicide Brother- Depression  Social History:  Social History   Substance and Sexual Activity  Alcohol Use No     Social History   Substance and Sexual Activity  Drug Use Yes   Types: Marijuana    Social History  Socioeconomic History   Marital status: Single    Spouse name: Not on file   Number of children: Not on file   Years of education: Not on file   Highest education level: Not on file  Occupational History   Not on file  Tobacco Use   Smoking status: Former    Current packs/day: 0.00    Types: Cigarettes    Quit date: 04/04/2012    Years since quitting: 11.9   Smokeless tobacco:  Never   Tobacco comments:    Pt is nonverbal. Unknown.   Vaping Use   Vaping status: Never Used  Substance and Sexual Activity   Alcohol use: No   Drug use: Yes    Types: Marijuana   Sexual activity: Never  Other Topics Concern   Not on file  Social History Narrative   Not on file   Social Drivers of Health   Financial Resource Strain: Not on file  Food Insecurity: Patient Declined (02/22/2024)   Hunger Vital Sign    Worried About Running Out of Food in the Last Year: Patient declined    Ran Out of Food in the Last Year: Patient declined  Recent Concern: Food Insecurity - Food Insecurity Present (01/28/2024)   Hunger Vital Sign    Worried About Running Out of Food in the Last Year: Sometimes true    Ran Out of Food in the Last Year: Sometimes true  Transportation Needs: No Transportation Needs (02/22/2024)   PRAPARE - Administrator, Civil Service (Medical): No    Lack of Transportation (Non-Medical): No  Physical Activity: Not on file  Stress: Not on file (06/11/2023)  Social Connections: Unknown (01/28/2024)   Social Connection and Isolation Panel    Frequency of Communication with Friends and Family: Patient unable to answer    Frequency of Social Gatherings with Friends and Family: Patient unable to answer    Attends Religious Services: Patient unable to answer    Active Member of Clubs or Organizations: Patient unable to answer    Attends Banker Meetings: Patient unable to answer    Marital Status: Married   Additional Social History:                         Sleep: Good Estimated Sleeping Duration (Last 24 Hours): 6.75-7.00 hours  Appetite:  Good  Current Medications: Current Facility-Administered Medications  Medication Dose Route Frequency Provider Last Rate Last Admin   acetaminophen  (TYLENOL ) tablet 650 mg  650 mg Oral Q6H PRN Motley-Mangrum, Jadeka A, PMHNP       alum & mag hydroxide-simeth (MAALOX/MYLANTA) 200-200-20  MG/5ML suspension 30 mL  30 mL Oral Q4H PRN Motley-Mangrum, Jadeka A, PMHNP       haloperidol  (HALDOL ) tablet 5 mg  5 mg Oral TID PRN Motley-Mangrum, Jadeka A, PMHNP       And   diphenhydrAMINE  (BENADRYL ) capsule 50 mg  50 mg Oral TID PRN Motley-Mangrum, Jadeka A, PMHNP       haloperidol  lactate (HALDOL ) injection 5 mg  5 mg Intramuscular TID PRN Motley-Mangrum, Jadeka A, PMHNP       And   diphenhydrAMINE  (BENADRYL ) injection 50 mg  50 mg Intramuscular TID PRN Motley-Mangrum, Jadeka A, PMHNP       And   LORazepam  (ATIVAN ) injection 2 mg  2 mg Intramuscular TID PRN Motley-Mangrum, Jadeka A, PMHNP       haloperidol  lactate (HALDOL ) injection 10 mg  10 mg  Intramuscular TID PRN Motley-Mangrum, Jadeka A, PMHNP       And   diphenhydrAMINE  (BENADRYL ) injection 50 mg  50 mg Intramuscular TID PRN Motley-Mangrum, Jadeka A, PMHNP       And   LORazepam  (ATIVAN ) injection 2 mg  2 mg Intramuscular TID PRN Motley-Mangrum, Jadeka A, PMHNP       feeding supplement (ENSURE PLUS HIGH PROTEIN) liquid 237 mL  237 mL Oral BID BM Sanjana Folz S, DO   237 mL at 02/25/24 9185   lisdexamfetamine (VYVANSE ) capsule 10 mg  10 mg Oral Daily Shervin Cypert S, DO   10 mg at 02/25/24 9185   LORazepam  (ATIVAN ) tablet 0.5 mg  0.5 mg Oral TID AC Joaquina Nissen S, DO       magnesium  hydroxide (MILK OF MAGNESIA) suspension 30 mL  30 mL Oral Daily PRN Motley-Mangrum, Jadeka A, PMHNP       OLANZapine  zydis (ZYPREXA ) disintegrating tablet 7.5 mg  7.5 mg Oral QHS Tyresa Prindiville S, DO       traZODone  (DESYREL ) tablet 50 mg  50 mg Oral QHS PRN Raliegh Marsa RAMAN, DO        Lab Results:  No results found for this or any previous visit (from the past 48 hours).   Blood Alcohol level:  Lab Results  Component Value Date   [facility] <15 02/22/2024   ETH <15 01/28/2024    Metabolic Disorder Labs: Lab Results  Component Value Date   HGBA1C 5.4 02/04/2024   MPG 108 02/04/2024   MPG 103 07/15/2016   No results  found for: PROLACTIN Lab Results  Component Value Date   CHOL 153 02/04/2024   TRIG 155 (H) 02/04/2024   HDL 29 (L) 02/04/2024   CHOLHDL 5.3 02/04/2024   VLDL 31 02/04/2024   LDLCALC 93 02/04/2024   LDLCALC 140 (H) 07/15/2016    Physical Findings: AIMS:  ,  ,  ,  ,  ,  ,   CIWA:    COWS:     Musculoskeletal: Strength & Muscle Tone: within normal limits Gait & Station: normal Patient leans: N/A  Psychiatric Specialty Exam:  Presentation  General Appearance:  Appropriate for Environment; Casual  Eye Contact: Good  Speech: Normal Rate; Clear and Coherent  Speech Volume: Normal  Handedness: Right   Mood and Affect  Mood: Dysphoric  Affect: Congruent; Appropriate   Thought Process  Thought Processes: Coherent; Goal Directed  Descriptions of Associations:Intact  Orientation:Full (Time, Place and Person)  Thought Content:Logical; WDL  History of Schizophrenia/Schizoaffective disorder:No  Duration of Psychotic Symptoms:Less than six months  Hallucinations:Hallucinations: None  Ideas of Reference:None  Suicidal Thoughts:Suicidal Thoughts: No  Homicidal Thoughts:Homicidal Thoughts: No   Sensorium  Memory: Immediate Fair  Judgment: Fair  Insight: Fair   Art therapist  Concentration: Fair  Attention Span: Fair  Recall: [employer] of Knowledge: Fair  Language: Fair   Psychomotor Activity  Psychomotor Activity: Psychomotor Activity: Normal   Assets  Assets: Physical Health; Resilience; Desire for Improvement; Communication Skills   Sleep  Sleep: Sleep: Good    Physical Exam: Physical Exam Vitals and nursing note reviewed.  Constitutional:      General: He is not in acute distress.    Appearance: Normal appearance. He is normal weight. He is not ill-appearing or toxic-appearing.  HENT:     Head: Normocephalic and atraumatic.  Pulmonary:     Effort: Pulmonary effort is normal.  Musculoskeletal:         General:  Normal range of motion.  Neurological:     General: No focal deficit present.     Mental Status: He is alert.    Review of Systems  Respiratory:  Negative for cough and shortness of breath.   Cardiovascular:  Negative for chest pain.  Gastrointestinal:  Negative for abdominal pain, constipation, diarrhea, nausea and vomiting.  Neurological:  Negative for dizziness, weakness and headaches.  Psychiatric/Behavioral:  Positive for depression. Negative for hallucinations and suicidal ideas. The patient is not nervous/anxious.    Blood pressure 100/62, pulse 75, temperature 97.7 F (36.5 C), temperature source Oral, resp. rate 20, height 5' 10 (1.778 m), weight 78 kg, SpO2 99%. Body mass index is 24.68 kg/m.   Treatment Plan Summary: Daily contact with patient to assess and evaluate symptoms and progress in treatment and Medication management  Benjamin [name] is a 43 yr old male who presented under IVC to [facility] on 7/21 due to concerns for fasting and not drinking or eating, he was admitted to [facility] on 7/22.  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    Benjamin [name] has continued to eat and be active on the unit.  We will begin decreasing his Ativan  to 0.5 mg TID.  Due to day time sedation we will reduce his Zyprexa .  He will receive his first dose of Vyvanse  this morning at a significantly reduced rate and will titrate as tolerated.  We will continue to monitor.    MDD, Recurrent, Severe, w/psychosis w/ Catatonic features: -Decrease Zyprexa  to 7.5 mg QHS for psychosis -Continue Ativan  0.5 mg TID for Catatonia -Start Vyvanse  10 mg daily for low energy -Continue Agitation Protocol: Haldol /Ativan /Benadryl      -Continue  PRN's: Tylenol , Maalox, Atarax , Milk of Magnesia, Trazodone    --  The risks/benefits/side-effects/alternatives to medications were discussed in detail with the patient and time was given for questions. The patient consents to medication trials.                -- Metabolic profile and EKG monitoring obtained while on an atypical antipsychotic (BMI:24.68 CMP: WNL except BUN:22,  CBC: WNL except  RBC: 6.07,  Hem: 12.7,  MCV: 73.8,  MCH: 20.9,  MCHC: 28.3,  RDW: 20.2,  UDS: THC positive, EKG: Sinus Rhythm w/ Qtc: 419 A1c(7/3): 5.4,  Lipid Panel(7/3): WNL except Trig: 155, HDL: 29              -- Encouraged patient to participate in unit milieu and in scheduled group therapies              -- Short Term Goals: Ability to identify changes in lifestyle to reduce recurrence of condition will improve, Ability to verbalize feelings will improve, Ability to disclose and discuss suicidal ideas, Ability to demonstrate self-control will improve, Ability to identify and develop effective coping behaviors will improve, Ability to maintain clinical measurements within normal limits will improve, Compliance with prescribed medications will improve, and Ability to identify triggers associated with substance abuse/mental health issues will improve             -- Long Term Goals: Improvement in symptoms so as ready for discharge   Safety and Monitoring:             -- Involuntary admission to inpatient psychiatric unit for safety,  stabilization and treatment             -- Daily contact with patient to assess and evaluate symptoms and progress in treatment             -- Patient's case to be discussed in multi-disciplinary team meeting             -- Observation Level : q15 minute checks             -- Vital signs:  q12 hours             -- Precautions: suicide, elopement, and assault  Discharge Planning:              -- Social work and case management to assist with discharge planning and identification of hospital  follow-up needs prior to discharge             -- Estimated LOS: 4-6 more days             -- Discharge Concerns: Need to establish a safety plan; Medication compliance and effectiveness             -- Discharge Goals: Return home with outpatient referrals for mental health follow-up including medication management/psychotherapy   Marsa GORMAN Rosser, DO 02/25/2024, 10:42 AM

## 2024-02-25 NOTE — Plan of Care (Signed)
   Problem: Education: Goal: Emotional status will improve Outcome: Progressing Goal: Mental status will improve Outcome: Progressing   Problem: Activity: Goal: Interest or engagement in activities will improve Outcome: Progressing

## 2024-02-25 NOTE — Group Note (Signed)
 Date:  02/25/2024 Time:  11:25 AM  Group Topic/Focus:  Goals Group:   The focus of this group is to help patients establish daily goals to achieve during treatment and discuss how the patient can incorporate goal setting into their daily lives to aide in recovery.    Participation Level:  Did Not Attend   Benjamin [name] 02/25/2024, 11:25 AM

## 2024-02-26 DIAGNOSIS — F333 Major depressive disorder, recurrent, severe with psychotic symptoms: Secondary | ICD-10-CM | POA: Diagnosis not present

## 2024-02-26 MED ORDER — LISDEXAMFETAMINE DIMESYLATE 30 MG PO CAPS
30.0000 mg | ORAL_CAPSULE | Freq: Every day | ORAL | Status: DC
  Administered 2024-02-26 – 2024-02-27 (×2): 30 mg via ORAL
  Filled 2024-02-26 (×2): qty 1

## 2024-02-26 NOTE — Plan of Care (Signed)
 Nurse discussed anxiety, depression and coping skills with patient.

## 2024-02-26 NOTE — Group Note (Signed)
 Recreation Therapy Group Note   Group Topic:Leisure Education  Group Date: 02/26/2024 Start Time: 0934 End Time: 1013 Facilitators: Eudora Guevarra-McCall, LRT,CTRS Location: [address] Dayroom   Group Topic: Leisure Education  Goal Area(s) Addresses:  Patient will identify positive leisure activities.  Patient will identify one positive benefit of participation in leisure activities.   Behavioral Response:   Intervention: Leisure Group Game  Activity: Patients and LRT participated in playing a trivia game of Guess the [place]. Patients were divided into 2 group. Patients spin the spinner and whatever category ([address], Dance, [place], [place], Hip-Hop and R&B) they land on LRT reads the lyrics. If the team guesses the missing quote, they get the card, if not the other team gets a chance to steal the point.   Education:  Leisure Exposure, Pharmacist, community, Discharge Planning  Education Outcome: Acknowledges education/In group clarification offered/Needs additional education   Affect/Mood: N/A   Participation Level: Did not attend    Clinical Observations/Individualized Feedback:      Plan: Continue to engage patient in RT group sessions 2-3x/week.   Benjamin [name], LRT,CTRS 02/26/2024 1:22 PM

## 2024-02-26 NOTE — Progress Notes (Signed)
 (  Sleep Hours) - 7.25 (Any PRNs that were needed, meds refused, or side effects to meds)- none (Any disturbances and when (visitation, over night)- none (Concerns raised by the patient)- none (SI/HI/AVH)- denies

## 2024-02-26 NOTE — Progress Notes (Signed)
 [facility] MD Progress Note  02/26/2024 3:44 PM Benjamin [name]  MRN:  [id number] Subjective:   Benjamin [name] is a 43 yr old male who presented under IVC to [facility] on 7/21 due to concerns for fasting and not drinking or eating, he was admitted to [facility] on 7/22.  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    Case was discussed in the multidisciplinary team. MAR was reviewed and patient was compliant with medications.  He did not require any PRN medications yesterday.   Psychiatric Team made the following recommendations yesterday: -Decrease Zyprexa  to 7.5 mg QHS for psychosis -Continue Ativan  0.5 mg TID for Catatonia -Start Vyvanse  10 mg daily for low energy    On interview today patient reports he slept good last night.  He reports his appetite is doing good.  He reports no SI, HI, or AVH.  He reports no Paranoia or Ideas of Reference.  He reports no issues with his medications.  He reports that the decrease in Zyprexa  and Ativan  has been fine and not had any issue.  He reports no issues with the start of Vyvanse  yesterday and thinks he will tolerate the increase of it today as he is still tired.  Discussed we would continue making changes over the next few days and he reported understanding.  He reports no other concerns at present.    Principal Problem: MDD (major depressive disorder), recurrent, severe, with psychosis (HCC) Diagnosis: Principal Problem:   MDD (major depressive disorder), recurrent, severe, with psychosis (HCC) Active Problems:   Catatonic withdrawn type  Total Time spent with patient:  I personally spent 35 minutes on the unit in direct patient care. The direct patient care time included face-to-face time with  the patient, reviewing the patient's chart, communicating with other professionals, and coordinating care. Greater than 50% of this time was spent in counseling or coordinating care with the patient regarding goals of hospitalization, psycho-education, and discharge planning needs.   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   Past Medical History:  Past Medical History:  Diagnosis Date   Depression    Hematemesis 08/2017   Herniated disc    x3   Hypertension    Mental disorder     Past Surgical History:  Procedure Laterality Date   ESOPHAGOGASTRODUODENOSCOPY Left 08/27/2017   Procedure: ESOPHAGOGASTRODUODENOSCOPY (EGD);  Surgeon: Rollin Dover, MD;  Location: [facility] ENDOSCOPY;  Service: Endoscopy;  Laterality: Left;   SHOULDER SURGERY     WISDOM TOOTH EXTRACTION     Family History:  Family History  Problem Relation Age of Onset   Seizures Mother    Stroke Mother    Diabetes Mellitus II Mother    Family Psychiatric  History:  Father- Suicide Brother- Depression  Social History:  Social History   Substance and Sexual Activity  Alcohol Use No     Social History   Substance and Sexual Activity  Drug Use Yes   Types: Marijuana    Social History   Socioeconomic History   Marital status: Single    Spouse name: Not on file  Number of children: Not on file   Years of education: Not on file   Highest education level: Not on file  Occupational History   Not on file  Tobacco Use   Smoking status: Former    Current packs/day: 0.00    Types: Cigarettes    Quit date: 04/04/2012    Years since quitting: 11.9   Smokeless tobacco: Never   Tobacco comments:    Pt is nonverbal. Unknown.   Vaping Use    Vaping status: Never Used  Substance and Sexual Activity   Alcohol use: No   Drug use: Yes    Types: Marijuana   Sexual activity: Never  Other Topics Concern   Not on file  Social History Narrative   Not on file   Social Drivers of Health   Financial Resource Strain: Not on file  Food Insecurity: Patient Declined (02/22/2024)   Hunger Vital Sign    Worried About Running Out of Food in the Last Year: Patient declined    Ran Out of Food in the Last Year: Patient declined  Recent Concern: Food Insecurity - Food Insecurity Present (01/28/2024)   Hunger Vital Sign    Worried About Running Out of Food in the Last Year: Sometimes true    Ran Out of Food in the Last Year: Sometimes true  Transportation Needs: No Transportation Needs (02/22/2024)   PRAPARE - Administrator, Civil Service (Medical): No    Lack of Transportation (Non-Medical): No  Physical Activity: Not on file  Stress: Not on file (06/11/2023)  Social Connections: Unknown (01/28/2024)   Social Connection and Isolation Panel    Frequency of Communication with Friends and Family: Patient unable to answer    Frequency of Social Gatherings with Friends and Family: Patient unable to answer    Attends Religious Services: Patient unable to answer    Active Member of Clubs or Organizations: Patient unable to answer    Attends Banker Meetings: Patient unable to answer    Marital Status: Married   Additional Social History:                         Sleep: Good Estimated Sleeping Duration (Last 24 Hours): 7.50-8.50 hours  Appetite:  Good  Current Medications: Current Facility-Administered Medications  Medication Dose Route Frequency Provider Last Rate Last Admin   acetaminophen  (TYLENOL ) tablet 650 mg  650 mg Oral Q6H PRN Motley-Mangrum, Jadeka A, PMHNP       alum & mag hydroxide-simeth (MAALOX/MYLANTA) 200-200-20 MG/5ML suspension 30 mL  30 mL Oral Q4H PRN Motley-Mangrum, Jadeka A, PMHNP    30 mL at 02/25/24 1118   haloperidol  (HALDOL ) tablet 5 mg  5 mg Oral TID PRN Motley-Mangrum, Jadeka A, PMHNP       And   diphenhydrAMINE  (BENADRYL ) capsule 50 mg  50 mg Oral TID PRN Motley-Mangrum, Jadeka A, PMHNP       haloperidol  lactate (HALDOL ) injection 5 mg  5 mg Intramuscular TID PRN Motley-Mangrum, Jadeka A, PMHNP       And   diphenhydrAMINE  (BENADRYL ) injection 50 mg  50 mg Intramuscular TID PRN Motley-Mangrum, Jadeka A, PMHNP       And   LORazepam  (ATIVAN ) injection 2 mg  2 mg Intramuscular TID PRN Motley-Mangrum, Jadeka A, PMHNP       haloperidol  lactate (HALDOL ) injection 10 mg  10 mg Intramuscular TID PRN Motley-Mangrum, Jadeka A, PMHNP       And  diphenhydrAMINE  (BENADRYL ) injection 50 mg  50 mg Intramuscular TID PRN Motley-Mangrum, Jadeka A, PMHNP       And   LORazepam  (ATIVAN ) injection 2 mg  2 mg Intramuscular TID PRN Motley-Mangrum, Jadeka A, PMHNP       famotidine  (PEPCID ) tablet 20 mg  20 mg Oral Daily Paisely Brick S, DO   20 mg at 02/26/24 0743   feeding supplement (ENSURE PLUS HIGH PROTEIN) liquid 237 mL  237 mL Oral BID BM Laasya Peyton S, DO   237 mL at 02/26/24 1033   lisdexamfetamine (VYVANSE ) capsule 30 mg  30 mg Oral Daily Masami Plata S, DO   30 mg at 02/26/24 9256   LORazepam  (ATIVAN ) tablet 0.5 mg  0.5 mg Oral TID AC Gerber Penza S, DO   0.5 mg at 02/26/24 1135   magnesium  hydroxide (MILK OF MAGNESIA) suspension 30 mL  30 mL Oral Daily PRN Motley-Mangrum, Jadeka A, PMHNP       OLANZapine  zydis (ZYPREXA ) disintegrating tablet 7.5 mg  7.5 mg Oral QHS Reubin Bushnell S, DO   7.5 mg at 02/25/24 2132   traZODone  (DESYREL ) tablet 50 mg  50 mg Oral QHS PRN Raliegh Marsa RAMAN, DO        Lab Results:  No results found for this or any previous visit (from the past 48 hours).   Blood Alcohol level:  Lab Results  Component Value Date   [facility] <15 02/22/2024   ETH <15 01/28/2024    Metabolic Disorder Labs: Lab Results  Component  Value Date   HGBA1C 5.4 02/04/2024   MPG 108 02/04/2024   MPG 103 07/15/2016   No results found for: PROLACTIN Lab Results  Component Value Date   CHOL 153 02/04/2024   TRIG 155 (H) 02/04/2024   HDL 29 (L) 02/04/2024   CHOLHDL 5.3 02/04/2024   VLDL 31 02/04/2024   LDLCALC 93 02/04/2024   LDLCALC 140 (H) 07/15/2016    Physical Findings: AIMS:  ,  ,  ,  ,  ,  ,   CIWA:    COWS:     Musculoskeletal: Strength & Muscle Tone: within normal limits Gait & Station: normal Patient leans: N/A  Psychiatric Specialty Exam:  Presentation  General Appearance:  Appropriate for Environment; Casual  Eye Contact: Good  Speech: Clear and Coherent; Normal Rate  Speech Volume: Normal  Handedness: Right   Mood and Affect  Mood: Dysphoric  Affect: Congruent   Thought Process  Thought Processes: Coherent; Goal Directed  Descriptions of Associations:Intact  Orientation:Full (Time, Place and Person)  Thought Content:Logical; WDL  History of Schizophrenia/Schizoaffective disorder:No  Duration of Psychotic Symptoms:Less than six months  Hallucinations:Hallucinations: None  Ideas of Reference:None  Suicidal Thoughts:Suicidal Thoughts: No  Homicidal Thoughts:Homicidal Thoughts: No   Sensorium  Memory: Immediate Fair  Judgment: Fair  Insight: Fair   Art therapist  Concentration: Fair  Attention Span: Fair  Recall: [employer] of Knowledge: Fair  Language: Fair   Psychomotor Activity  Psychomotor Activity: Psychomotor Activity: Normal   Assets  Assets: Communication Skills; Desire for Improvement; Physical Health; Resilience   Sleep  Sleep: Sleep: Good    Physical Exam: Physical Exam Vitals and nursing note reviewed.  Constitutional:      General: He is not in acute distress.    Appearance: Normal appearance. He is normal weight. He is not ill-appearing or toxic-appearing.  HENT:     Head: Normocephalic and  atraumatic.  Pulmonary:     Effort: Pulmonary effort  is normal.  Musculoskeletal:        General: Normal range of motion.  Neurological:     General: No focal deficit present.     Mental Status: He is alert.    Review of Systems  Respiratory:  Negative for cough and shortness of breath.   Cardiovascular:  Negative for chest pain.  Gastrointestinal:  Negative for abdominal pain, constipation, diarrhea, nausea and vomiting.  Neurological:  Negative for dizziness, weakness and headaches.  Psychiatric/Behavioral:  Positive for depression. Negative for hallucinations and suicidal ideas. The patient is not nervous/anxious.    Blood pressure (!) 129/90, pulse 86, temperature 98 F (36.7 C), temperature source Oral, resp. rate 18, height 5' 10 (1.778 m), weight 78 kg, SpO2 98%. Body mass index is 24.68 kg/m.   Treatment Plan Summary: Daily contact with patient to assess and evaluate symptoms and progress in treatment and Medication management  Benjamin [name] is a 43 yr old male who presented under IVC to [facility] on 7/21 due to concerns for fasting and not drinking or eating, he was admitted to [facility] on 7/22.  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    Benjamin [name] has tolerated the changes to his medications- decrease Zyprexa  and start Vyvanse .  He continues to eat and be interactive on the unit without psychosis or hyper-religiosity.  We will monitor for his continued response and consider decreasing his Ativan  tomorrow.  We will continue to monitor.    MDD, Recurrent, Severe, w/psychosis w/ Catatonic features: -Continue Zyprexa  7.5 mg QHS for psychosis -Continue Ativan  0.5 mg TID for Catatonia -Increase Vyvanse  to 30 mg daily  for low energy -Continue Agitation Protocol: Haldol /Ativan /Benadryl      -Continue PRN's: Tylenol , Maalox, Atarax , Milk of Magnesia, Trazodone    --  The risks/benefits/side-effects/alternatives to medications were discussed in detail with the patient and time was given for questions. The patient consents to medication trials.                -- Metabolic profile and EKG monitoring obtained while on an atypical antipsychotic (BMI:24.68 CMP: WNL except BUN:22,  CBC: WNL except  RBC: 6.07,  Hem: 12.7,  MCV: 73.8,  MCH: 20.9,  MCHC: 28.3,  RDW: 20.2,  UDS: THC positive, EKG: Sinus Rhythm w/ Qtc: 419 A1c(7/3): 5.4,  Lipid Panel(7/3): WNL except Trig: 155, HDL: 29              -- Encouraged patient to participate in unit milieu and in scheduled group therapies              -- Short Term Goals: Ability to identify changes in lifestyle to reduce recurrence of condition will improve, Ability to verbalize feelings will improve, Ability to disclose and discuss suicidal ideas, Ability to demonstrate self-control will improve, Ability to identify and develop effective coping behaviors will improve, Ability to maintain clinical measurements within normal limits will improve, Compliance with prescribed medications will improve, and Ability to identify triggers associated with substance abuse/mental health issues will improve             -- Long Term Goals: Improvement in symptoms so as ready for discharge   Safety and Monitoring:             -- Involuntary admission to inpatient psychiatric unit for  safety, stabilization and treatment             -- Daily contact with patient to assess and evaluate symptoms and progress in treatment             -- Patient's case to be discussed in multi-disciplinary team meeting             -- Observation Level : q15 minute checks             -- Vital signs:  q12 hours             -- Precautions: suicide, elopement, and assault  Discharge Planning:              -- Social  work and case management to assist with discharge planning and identification of hospital follow-up needs prior to discharge             -- Estimated LOS: 3-5 more days             -- Discharge Concerns: Need to establish a safety plan; Medication compliance and effectiveness             -- Discharge Goals: Return home with outpatient referrals for mental health follow-up including medication management/psychotherapy   Marsa GORMAN Rosser, DO 02/26/2024, 3:44 PM

## 2024-02-26 NOTE — Progress Notes (Addendum)
 D:  Patient denied SI and HI, contracts for safety.  Denied A/V hallucinations.  Denied pain. A:  Medications administered per MD orders.  Emotional support and encouragement given patient. R:  Safety maintained with 15 minute checks.

## 2024-02-26 NOTE — [facility] Group Notes (Signed)
 The focus of this group is to help patients establish daily goals to achieve during treatment and discuss how the patient can incorporate goal setting into their daily lives to aide in recovery.      Scale 1-10  7    Goal: Go to groups

## 2024-02-27 DIAGNOSIS — F333 Major depressive disorder, recurrent, severe with psychotic symptoms: Secondary | ICD-10-CM | POA: Diagnosis not present

## 2024-02-27 MED ORDER — LORAZEPAM 0.5 MG PO TABS
0.5000 mg | ORAL_TABLET | Freq: Two times a day (BID) | ORAL | Status: DC
  Administered 2024-02-27 – 2024-02-28 (×3): 0.5 mg via ORAL
  Filled 2024-02-27 (×3): qty 1

## 2024-02-27 MED ORDER — LISDEXAMFETAMINE DIMESYLATE 30 MG PO CAPS
40.0000 mg | ORAL_CAPSULE | Freq: Every day | ORAL | Status: DC
  Administered 2024-02-28: 40 mg via ORAL
  Filled 2024-02-27: qty 1

## 2024-02-27 NOTE — Progress Notes (Signed)
   02/27/24 0800  Psych Admission Type (Psych Patients Only)  Admission Status Involuntary  Psychosocial Assessment  Patient Complaints None  Eye Contact Fair  Facial Expression Animated  Affect Appropriate to circumstance  Speech Logical/coherent  Interaction Assertive  Motor Activity Slow  Appearance/Hygiene Unremarkable  Behavior Characteristics Cooperative  Mood Anxious  Thought Process  Coherency WDL  Content WDL  Delusions None reported or observed  Perception WDL  Hallucination None reported or observed  Judgment Limited  Confusion None  Danger to Self  Current suicidal ideation? Denies  Description of Suicide Plan No Plan  Agreement Not to Harm Self Yes  Description of Agreement Verbal Contract  Danger to Others  Danger to Others None reported or observed

## 2024-02-27 NOTE — Progress Notes (Signed)
   02/27/24 2222  Psych Admission Type (Psych Patients Only)  Admission Status Involuntary  Psychosocial Assessment  Patient Complaints None  Eye Contact Fair  Facial Expression Animated  Affect Appropriate to circumstance  Speech Logical/coherent  Interaction Assertive  Motor Activity Other (Comment) (WDL)  Appearance/Hygiene Unremarkable  Behavior Characteristics Appropriate to situation  Mood Pleasant  Thought Process  Coherency WDL  Content WDL  Delusions None reported or observed  Perception WDL  Hallucination None reported or observed  Judgment Limited  Confusion None  Danger to Self  Current suicidal ideation? Denies  Agreement Not to Harm Self Yes  Description of Agreement Verbal  Danger to Others  Danger to Others None reported or observed

## 2024-02-27 NOTE — Plan of Care (Signed)
  Problem: Education: Goal: Emotional status will improve Outcome: Progressing Goal: Verbalization of understanding the information provided will improve Outcome: Progressing   Problem: Activity: Goal: Interest or engagement in activities will improve Outcome: Progressing   

## 2024-02-27 NOTE — Progress Notes (Signed)
 [facility] MD Progress Note  02/27/2024 1:34 PM Benjamin [name]  MRN:  [id number] Subjective:   Benjamin [name] is a 43 yr old male who presented under IVC to [facility] on 7/21 due to concerns for fasting and not drinking or eating, he was admitted to [facility] on 7/22.  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    Case was discussed in the multidisciplinary team. MAR was reviewed and patient was compliant with medications.  He did not receive any PRN medications yesterday.   Psychiatric Team made the following recommendations yesterday: -Continue Zyprexa  7.5 mg QHS for psychosis -Continue Ativan  0.5 mg TID for Catatonia -Increase Vyvanse  to 30 mg daily for low energy    On interview today patient reports he slept good last night.  He reports his appetite is doing good.  He reports no SI, HI, or AVH.  He reports no Paranoia or Ideas of Reference.  He reports no issues with his medications.  Discussed with him that we would continue to make adjustments to his medications and monitor his response.  Discussed the need to do this slowly to reduce the risk of psychosis or catatonia returning and he reported understanding.  Discussed we would decrease his Ativan  today and increase his Vyvanse  tomorrow and he was agreeable.  He reports no other concerns at present.    Principal Problem: MDD (major depressive disorder), recurrent, severe, with psychosis (HCC) Diagnosis: Principal Problem:   MDD (major depressive disorder), recurrent, severe, with psychosis (HCC) Active Problems:   Catatonic withdrawn type  Total Time spent with patient:  I personally spent 35 minutes on the unit in direct patient care. The direct patient care time included  face-to-face time with the patient, reviewing the patient's chart, communicating with other professionals, and coordinating care. Greater than 50% of this time was spent in counseling or coordinating care with the patient regarding goals of hospitalization, psycho-education, and discharge planning needs.   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   Past Medical History:  Past Medical History:  Diagnosis Date   Depression    Hematemesis 08/2017   Herniated disc    x3   Hypertension    Mental disorder     Past Surgical History:  Procedure Laterality Date   ESOPHAGOGASTRODUODENOSCOPY Left 08/27/2017   Procedure: ESOPHAGOGASTRODUODENOSCOPY (EGD);  Surgeon: Rollin Dover, MD;  Location: [facility] ENDOSCOPY;  Service: Endoscopy;  Laterality: Left;   SHOULDER SURGERY     WISDOM TOOTH EXTRACTION     Family History:  Family History  Problem Relation Age of Onset   Seizures Mother    Stroke Mother    Diabetes Mellitus II Mother    Family Psychiatric  History:  Father- Suicide Brother- Depression  Social History:  Social History   Substance and Sexual Activity  Alcohol Use No     Social History   Substance and Sexual Activity  Drug Use Yes   Types: Marijuana    Social History   Socioeconomic History   Marital status: Single    Spouse name: Not on file   Number of children:  Not on file   Years of education: Not on file   Highest education level: Not on file  Occupational History   Not on file  Tobacco Use   Smoking status: Former    Current packs/day: 0.00    Types: Cigarettes    Quit date: 04/04/2012    Years since quitting: 11.9   Smokeless tobacco: Never   Tobacco comments:    Pt is nonverbal.  Unknown.   Vaping Use   Vaping status: Never Used  Substance and Sexual Activity   Alcohol use: No   Drug use: Yes    Types: Marijuana   Sexual activity: Never  Other Topics Concern   Not on file  Social History Narrative   Not on file   Social Drivers of Health   Financial Resource Strain: Not on file  Food Insecurity: Patient Declined (02/22/2024)   Hunger Vital Sign    Worried About Running Out of Food in the Last Year: Patient declined    Ran Out of Food in the Last Year: Patient declined  Recent Concern: Food Insecurity - Food Insecurity Present (01/28/2024)   Hunger Vital Sign    Worried About Running Out of Food in the Last Year: Sometimes true    Ran Out of Food in the Last Year: Sometimes true  Transportation Needs: No Transportation Needs (02/22/2024)   PRAPARE - Administrator, Civil Service (Medical): No    Lack of Transportation (Non-Medical): No  Physical Activity: Not on file  Stress: Not on file (06/11/2023)  Social Connections: Unknown (01/28/2024)   Social Connection and Isolation Panel    Frequency of Communication with Friends and Family: Patient unable to answer    Frequency of Social Gatherings with Friends and Family: Patient unable to answer    Attends Religious Services: Patient unable to answer    Active Member of Clubs or Organizations: Patient unable to answer    Attends Banker Meetings: Patient unable to answer    Marital Status: Married   Additional Social History:                         Sleep: Good Estimated Sleeping Duration (Last 24 Hours): 5.50-6.00 hours  Appetite:  Good  Current Medications: Current Facility-Administered Medications  Medication Dose Route Frequency Provider Last Rate Last Admin   acetaminophen  (TYLENOL ) tablet 650 mg  650 mg Oral Q6H PRN Motley-Mangrum, Jadeka A, PMHNP       alum & mag hydroxide-simeth (MAALOX/MYLANTA) 200-200-20 MG/5ML suspension 30 mL  30 mL Oral Q4H PRN  Motley-Mangrum, Jadeka A, PMHNP   30 mL at 02/25/24 1118   haloperidol  (HALDOL ) tablet 5 mg  5 mg Oral TID PRN Motley-Mangrum, Jadeka A, PMHNP       And   diphenhydrAMINE  (BENADRYL ) capsule 50 mg  50 mg Oral TID PRN Motley-Mangrum, Jadeka A, PMHNP       haloperidol  lactate (HALDOL ) injection 5 mg  5 mg Intramuscular TID PRN Motley-Mangrum, Jadeka A, PMHNP       And   diphenhydrAMINE  (BENADRYL ) injection 50 mg  50 mg Intramuscular TID PRN Motley-Mangrum, Jadeka A, PMHNP       And   LORazepam  (ATIVAN ) injection 2 mg  2 mg Intramuscular TID PRN Motley-Mangrum, Jadeka A, PMHNP       haloperidol  lactate (HALDOL ) injection 10 mg  10 mg Intramuscular TID PRN Motley-Mangrum, Jadeka A, PMHNP       And   diphenhydrAMINE  (  BENADRYL ) injection 50 mg  50 mg Intramuscular TID PRN Motley-Mangrum, Jadeka A, PMHNP       And   LORazepam  (ATIVAN ) injection 2 mg  2 mg Intramuscular TID PRN Motley-Mangrum, Jadeka A, PMHNP       famotidine  (PEPCID ) tablet 20 mg  20 mg Oral Daily Reshard Guillet S, DO   20 mg at 02/27/24 9257   feeding supplement (ENSURE PLUS HIGH PROTEIN) liquid 237 mL  237 mL Oral BID BM Raliegh Marsa RAMAN, DO   237 mL at 02/27/24 0802   [START ON 02/28/2024] lisdexamfetamine (VYVANSE ) capsule 40 mg  40 mg Oral Daily Mariaelena Cade S, DO       LORazepam  (ATIVAN ) tablet 0.5 mg  0.5 mg Oral BID Lamiracle Chaidez S, DO       magnesium  hydroxide (MILK OF MAGNESIA) suspension 30 mL  30 mL Oral Daily PRN Motley-Mangrum, Jadeka A, PMHNP       OLANZapine  zydis (ZYPREXA ) disintegrating tablet 7.5 mg  7.5 mg Oral QHS Kanisha Duba S, DO   7.5 mg at 02/26/24 2128   traZODone  (DESYREL ) tablet 50 mg  50 mg Oral QHS PRN Raliegh Marsa RAMAN, DO        Lab Results:  No results found for this or any previous visit (from the past 48 hours).   Blood Alcohol level:  Lab Results  Component Value Date   [facility] <15 02/22/2024   ETH <15 01/28/2024    Metabolic Disorder Labs: Lab Results   Component Value Date   HGBA1C 5.4 02/04/2024   MPG 108 02/04/2024   MPG 103 07/15/2016   No results found for: PROLACTIN Lab Results  Component Value Date   CHOL 153 02/04/2024   TRIG 155 (H) 02/04/2024   HDL 29 (L) 02/04/2024   CHOLHDL 5.3 02/04/2024   VLDL 31 02/04/2024   LDLCALC 93 02/04/2024   LDLCALC 140 (H) 07/15/2016    Physical Findings: AIMS:  ,  ,  ,  ,  ,  ,   CIWA:    COWS:     Musculoskeletal: Strength & Muscle Tone: within normal limits Gait & Station: normal Patient leans: N/A  Psychiatric Specialty Exam:  Presentation  General Appearance:  Appropriate for Environment; Casual  Eye Contact: Good  Speech: Clear and Coherent; Normal Rate  Speech Volume: Normal  Handedness: Right   Mood and Affect  Mood: -- (ok)  Affect: Congruent; Appropriate   Thought Process  Thought Processes: Coherent; Goal Directed  Descriptions of Associations:Intact  Orientation:Full (Time, Place and Person)  Thought Content:Logical; WDL  History of Schizophrenia/Schizoaffective disorder:No  Duration of Psychotic Symptoms:Less than six months  Hallucinations:Hallucinations: None  Ideas of Reference:None  Suicidal Thoughts:Suicidal Thoughts: No  Homicidal Thoughts:Homicidal Thoughts: No   Sensorium  Memory: Immediate Fair  Judgment: Fair  Insight: Fair   Art therapist  Concentration: Fair  Attention Span: Fair  Recall: [employer] of Knowledge: Fair  Language: Fair   Psychomotor Activity  Psychomotor Activity: Psychomotor Activity: Normal   Assets  Assets: Communication Skills; Desire for Improvement; Physical Health; Resilience   Sleep  Sleep: Sleep: Good    Physical Exam: Physical Exam Vitals and nursing note reviewed.  Constitutional:      General: He is not in acute distress.    Appearance: Normal appearance. He is normal weight. He is not ill-appearing or toxic-appearing.  HENT:     Head:  Normocephalic and atraumatic.  Pulmonary:     Effort: Pulmonary effort is normal.  Musculoskeletal:        General: Normal range of motion.  Neurological:     General: No focal deficit present.     Mental Status: He is alert.    Review of Systems  Respiratory:  Negative for cough and shortness of breath.   Cardiovascular:  Negative for chest pain.  Gastrointestinal:  Negative for abdominal pain, constipation, diarrhea, nausea and vomiting.  Neurological:  Negative for dizziness, weakness and headaches.  Psychiatric/Behavioral:  Negative for depression, hallucinations and suicidal ideas. The patient is not nervous/anxious.    Blood pressure 122/86, pulse 80, temperature 98.2 F (36.8 C), temperature source Oral, resp. rate 18, height 5' 10 (1.778 m), weight 78 kg, SpO2 100%. Body mass index is 24.68 kg/m.   Treatment Plan Summary: Daily contact with patient to assess and evaluate symptoms and progress in treatment and Medication management  Benjamin [name] is a 43 yr old male who presented under IVC to [facility] on 7/21 due to concerns for fasting and not drinking or eating, he was admitted to [facility] on 7/22.  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    Benjamin [name] has continued to do well with his medication regiment.  We will further reduce his Ativan  today to BID.  We will plan to increase his Vyvanse  to 40 mg tomorrow.  We will not make any other changes to his medications at this time.  We will continue to monitor his response to the titrations of his medications for return of either psychosis or catatonia.     MDD, Recurrent, Severe, w/psychosis w/ Catatonic features: -Continue Zyprexa  7.5 mg QHS for psychosis -Decrease  Ativan  frequency to 0.5 mg BID for Catatonia -Continue Vyvanse  30 mg daily for low energy -Continue Agitation Protocol: Haldol /Ativan /Benadryl      -Continue PRN's: Tylenol , Maalox, Atarax , Milk of Magnesia, Trazodone    --  The risks/benefits/side-effects/alternatives to medications were discussed in detail with the patient and time was given for questions. The patient consents to medication trials.                -- Metabolic profile and EKG monitoring obtained while on an atypical antipsychotic (BMI:24.68 CMP: WNL except BUN:22,  CBC: WNL except  RBC: 6.07,  Hem: 12.7,  MCV: 73.8,  MCH: 20.9,  MCHC: 28.3,  RDW: 20.2,  UDS: THC positive, EKG: Sinus Rhythm w/ Qtc: 419 A1c(7/3): 5.4,  Lipid Panel(7/3): WNL except Trig: 155, HDL: 29              -- Encouraged patient to participate in unit milieu and in scheduled group therapies              -- Short Term Goals: Ability to identify changes in lifestyle to reduce recurrence of condition will improve, Ability to verbalize feelings will improve, Ability to disclose and discuss suicidal ideas, Ability to demonstrate self-control will improve, Ability to identify and develop effective coping behaviors will improve, Ability to maintain clinical measurements within normal limits will improve, Compliance with prescribed medications will improve, and Ability to identify triggers associated with substance abuse/mental health issues will improve             -- Long Term Goals: Improvement in symptoms so as ready for discharge   Safety and Monitoring:             --  Involuntary admission to inpatient psychiatric unit for safety, stabilization and treatment             -- Daily contact with patient to assess and evaluate symptoms and progress in treatment             -- Patient's case to be discussed in multi-disciplinary team meeting             -- Observation Level : q15 minute checks             -- Vital signs:  q12 hours             -- Precautions:  suicide, elopement, and assault  Discharge Planning:              -- Social work and case management to assist with discharge planning and identification of hospital follow-up needs prior to discharge             -- Estimated LOS: 2-4 more days             -- Discharge Concerns: Need to establish a safety plan; Medication compliance and effectiveness             -- Discharge Goals: Return home with outpatient referrals for mental health follow-up including medication management/psychotherapy   Marsa GORMAN Rosser, DO 02/27/2024, 1:34 PM

## 2024-02-27 NOTE — B[facility] Group Notes (Signed)
 Adult Psychoeducational Group Note  Date:  02/27/2024 Time:  9:22 AM  Group Topic/Focus:  Goals Group:   The focus of this group is to help patients establish daily goals to achieve during treatment and discuss how the patient can incorporate goal setting into their daily lives to aide in recovery.  Participation Level:  Active  Participation Quality:  Appropriate  Affect:  Appropriate  Cognitive:  Alert  Insight: Appropriate  Engagement in Group:  Engaged  Modes of Intervention:  Orientation  Additional Comments:  Pt goal for today is to have patience and enjoy every minute   Niel CHRISTELLA Nightingale 02/27/2024, 9:22 AM

## 2024-02-27 NOTE — Group Note (Signed)
@  TD@  10:00-11:00AM  Type of Therapy and Topic:  Group Therapy:  Understanding feelings  Participation Level:  Active   Description of Group:  The patient in this group were given a blob tree worksheet. Patients in this group were introduced to understanding their feelings and to express their emotions.  The patient was able to identify how they are feeling in the current moment. The patient was encourage to define and describe how they are feeling and each type was acted out.  Patients discussed what additional feelings and how it impacts their life and the people around them.  An emphasis was placed on following up with the discharge plan when they leave the hospital to continue to discover their feelings to becoming healthier and happier.    Therapeutic Goals: 1)  Identify feelings from the blob tree  2)  discuss their feeling and the impact of their feelings  3)  discuss commitment to reaching their feelings      Summary of Patient Progress:  The patient share that he will be happy when he gets home and get to spend time with his family.   Therapeutic Modalities:   Psychoeducation Brief Solution-Focused Therapy

## 2024-02-27 NOTE — Group Note (Signed)
 Date:  02/27/2024 Time:  10:50 PM  Group Topic/Focus:  Wrap-Up Group:   The focus of this group is to help patients review their daily goal of treatment and discuss progress on daily workbooks.    Participation Level:  Active  Participation Quality:  Attentive  Affect:  Appropriate  Cognitive:  Appropriate  Insight: Appropriate  Engagement in Group:  Engaged  Modes of Intervention:  Discussion  Additional Comments:  Patient stated he had an okay day   Bari Moats 02/27/2024, 10:50 PM

## 2024-02-28 DIAGNOSIS — F333 Major depressive disorder, recurrent, severe with psychotic symptoms: Secondary | ICD-10-CM | POA: Diagnosis not present

## 2024-02-28 MED ORDER — FAMOTIDINE 20 MG PO TABS
40.0000 mg | ORAL_TABLET | Freq: Every day | ORAL | Status: DC
  Administered 2024-02-29 – 2024-03-01 (×2): 40 mg via ORAL
  Filled 2024-02-28 (×2): qty 2

## 2024-02-28 NOTE — [facility] Group Notes (Signed)
 Adult Psychoeducational Group Note  Date:  02/28/2024 Time:  8:47 PM  Group Topic/Focus:  Wrap-Up Group:   The focus of this group is to help patients review their daily goal of treatment and discuss progress on daily workbooks.  Participation Level:  Active  Participation Quality:  Appropriate  Affect:  Appropriate  Cognitive:  Appropriate  Insight: Appropriate  Engagement in Group:  Engaged  Modes of Intervention:  Discussion  Additional Comments: Pt attended group.  Drue Pouch 02/28/2024, 8:47 PM

## 2024-02-28 NOTE — Plan of Care (Signed)
   Problem: Education: Goal: Mental status will improve Outcome: Progressing   Problem: Activity: Goal: Interest or engagement in activities will improve Outcome: Progressing

## 2024-02-28 NOTE — Progress Notes (Signed)
(  Sleep Hours) - 7 hours (Any PRNs that were needed, meds refused, or side effects to meds)-  None (Any disturbances and when (visitation, over night)- None (Concerns raised by the patient)- None (SI/HI/AVH)-  Denies

## 2024-02-28 NOTE — Group Note (Unsigned)
 Date:  02/28/2024 Time:  8:23 PM  Group Topic/Focus:  Wrap-Up Group:   The focus of this group is to help patients review their daily goal of treatment and discuss progress on daily workbooks.     Participation Level:  {BHH PARTICIPATION OZCZO:77735}  Participation Quality:  {B[facility] PARTICIPATION QUALITY:22265}  Affect:  {B[facility] AFFECT:22266}  Cognitive:  {B[facility] COGNITIVE:22267}  Insight: {BHH Insight2:20797}  Engagement in Group:  {BHH ENGAGEMENT IN HMNLE:77731}  Modes of Intervention:  {BHH MODES OF INTERVENTION:22269}  Additional Comments:  ***  Gwenn Nobie [place] 02/28/2024, 8:23 PM

## 2024-02-28 NOTE — Plan of Care (Signed)
   Problem: Education: Goal: Emotional status will improve Outcome: Progressing Goal: Mental status will improve Outcome: Progressing Goal: Verbalization of understanding the information provided will improve Outcome: Progressing   Problem: Activity: Goal: Interest or engagement in activities will improve Outcome: Progressing

## 2024-02-28 NOTE — Plan of Care (Signed)
  Problem: Education: Goal: Knowledge of [employer] [facility] Education information/materials will improve Outcome: Progressing Goal: Emotional status will improve Outcome: Progressing Goal: Mental status will improve Outcome: Progressing   Problem: Activity: Goal: Interest or engagement in activities will improve Outcome: Progressing Goal: Sleeping patterns will improve Outcome: Progressing   

## 2024-02-28 NOTE — Group Note (Signed)
 Date:  02/28/2024 Time:  9:20 AM  Group Topic/Focus:  Goals Group:   The focus of this group is to help patients establish daily goals to achieve during treatment and discuss how the patient can incorporate goal setting into their daily lives to aide in recovery. Orientation:   The focus of this group is to educate the patient on the purpose and policies of crisis stabilization and provide a format to answer questions about their admission.  The group details unit policies and expectations of patients while admitted.    Participation Level:  Active  Participation Quality:  Appropriate  Affect:  Appropriate  Cognitive:  Appropriate  Insight: Appropriate  Engagement in Group:  Engaged  Modes of Intervention:  Orientation  Additional Comments:    Rankin JONETTA Sierra 02/28/2024, 9:20 AM

## 2024-02-28 NOTE — Progress Notes (Addendum)
 [facility] MD Progress Note  02/28/2024 12:49 PM Loyde Orth  MRN:  [id number] Subjective:   Benjamin [name] is a 43 yr old male who presented under IVC to [facility] on 7/21 due to concerns for fasting and not drinking or eating, he was admitted to [facility] on 7/22.  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    Case was discussed in the multidisciplinary team. MAR was reviewed and patient was compliant with medications.  He did not require any PRN medications yesterday.   Psychiatric Team made the following recommendations yesterday: -Continue Zyprexa  7.5 mg QHS for psychosis -Decrease Ativan  frequency to 0.5 mg BID for Catatonia  -Continue Vyvanse  30 mg daily for low energy    On interview today patient reports he slept good last night.  He reports his appetite is doing good.  He reports no SI, HI, or AVH.  He reports no Paranoia or Ideas of Reference.  He reports no issues with his medications.  He reports that he has not any return of his catatonic symptoms and no return of any hallucinations.  Discussed with him that we would continue to change his medications specifically further increasing his Vyvanse  tomorrow and decreasing his Ativan  frequency tomorrow and he was agreeable.  He reports some acid reflux and discussed further increasing his Famotidine .  He reports no other concerns at present.     Principal Problem: MDD (major depressive disorder), recurrent, severe, with psychosis (HCC) Diagnosis: Principal Problem:   MDD (major depressive disorder), recurrent, severe, with psychosis (HCC) Active Problems:   Catatonic withdrawn type  Total Time spent with patient:  I personally spent 35 minutes on the unit in direct patient  care. The direct patient care time included face-to-face time with the patient, reviewing the patient's chart, communicating with other professionals, and coordinating care. Greater than 50% of this time was spent in counseling or coordinating care with the patient regarding goals of hospitalization, psycho-education, and discharge planning needs.   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   Past Medical History:  Past Medical History:  Diagnosis Date   Depression    Hematemesis 08/2017   Herniated disc    x3   Hypertension    Mental disorder     Past Surgical History:  Procedure Laterality Date   ESOPHAGOGASTRODUODENOSCOPY Left 08/27/2017   Procedure: ESOPHAGOGASTRODUODENOSCOPY (EGD);  Surgeon: Rollin Dover, MD;  Location: [facility] ENDOSCOPY;  Service: Endoscopy;  Laterality: Left;   SHOULDER SURGERY     WISDOM TOOTH EXTRACTION     Family History:  Family History  Problem Relation Age of Onset   Seizures Mother    Stroke Mother    Diabetes Mellitus II Mother    Family Psychiatric  History:  Father- Suicide Brother- Depression  Social History:  Social History   Substance and Sexual Activity  Alcohol Use No     Social History   Substance and Sexual Activity  Drug Use Yes   Types: Marijuana    Social History   Socioeconomic History   Marital status: Single    Spouse name: Not on  file   Number of children: Not on file   Years of education: Not on file   Highest education level: Not on file  Occupational History   Not on file  Tobacco Use   Smoking status: Former    Current packs/day: 0.00    Types: Cigarettes    Quit date: 04/04/2012    Years since quitting: 11.9   Smokeless tobacco: Never    Tobacco comments:    Pt is nonverbal. Unknown.   Vaping Use   Vaping status: Never Used  Substance and Sexual Activity   Alcohol use: No   Drug use: Yes    Types: Marijuana   Sexual activity: Never  Other Topics Concern   Not on file  Social History Narrative   Not on file   Social Drivers of Health   Financial Resource Strain: Not on file  Food Insecurity: Patient Declined (02/22/2024)   Hunger Vital Sign    Worried About Running Out of Food in the Last Year: Patient declined    Ran Out of Food in the Last Year: Patient declined  Recent Concern: Food Insecurity - Food Insecurity Present (01/28/2024)   Hunger Vital Sign    Worried About Running Out of Food in the Last Year: Sometimes true    Ran Out of Food in the Last Year: Sometimes true  Transportation Needs: No Transportation Needs (02/22/2024)   PRAPARE - Administrator, Civil Service (Medical): No    Lack of Transportation (Non-Medical): No  Physical Activity: Not on file  Stress: Not on file (06/11/2023)  Social Connections: Unknown (01/28/2024)   Social Connection and Isolation Panel    Frequency of Communication with Friends and Family: Patient unable to answer    Frequency of Social Gatherings with Friends and Family: Patient unable to answer    Attends Religious Services: Patient unable to answer    Active Member of Clubs or Organizations: Patient unable to answer    Attends Banker Meetings: Patient unable to answer    Marital Status: Married   Additional Social History:                         Sleep: Good Estimated Sleeping Duration (Last 24 Hours): 5.75-7.25 hours  Appetite:  Good  Current Medications: Current Facility-Administered Medications  Medication Dose Route Frequency Provider Last Rate Last Admin   acetaminophen  (TYLENOL ) tablet 650 mg  650 mg Oral Q6H PRN Motley-Mangrum, Jadeka A, PMHNP       alum & mag hydroxide-simeth (MAALOX/MYLANTA) 200-200-20 MG/5ML  suspension 30 mL  30 mL Oral Q4H PRN Motley-Mangrum, Jadeka A, PMHNP   30 mL at 02/28/24 0851   haloperidol  (HALDOL ) tablet 5 mg  5 mg Oral TID PRN Motley-Mangrum, Jadeka A, PMHNP       And   diphenhydrAMINE  (BENADRYL ) capsule 50 mg  50 mg Oral TID PRN Motley-Mangrum, Jadeka A, PMHNP       haloperidol  lactate (HALDOL ) injection 5 mg  5 mg Intramuscular TID PRN Motley-Mangrum, Jadeka A, PMHNP       And   diphenhydrAMINE  (BENADRYL ) injection 50 mg  50 mg Intramuscular TID PRN Motley-Mangrum, Jadeka A, PMHNP       And   LORazepam  (ATIVAN ) injection 2 mg  2 mg Intramuscular TID PRN Motley-Mangrum, Jadeka A, PMHNP       haloperidol  lactate (HALDOL ) injection 10 mg  10 mg Intramuscular TID PRN Motley-Mangrum, Jadeka A, PMHNP  And   diphenhydrAMINE  (BENADRYL ) injection 50 mg  50 mg Intramuscular TID PRN Motley-Mangrum, Jadeka A, PMHNP       And   LORazepam  (ATIVAN ) injection 2 mg  2 mg Intramuscular TID PRN Motley-Mangrum, Jadeka A, PMHNP       [START ON 02/29/2024] famotidine  (PEPCID ) tablet 40 mg  40 mg Oral Daily Kamal Jurgens S, DO       feeding supplement (ENSURE PLUS HIGH PROTEIN) liquid 237 mL  237 mL Oral BID BM Remy Dia S, DO   237 mL at 02/28/24 9088   lisdexamfetamine (VYVANSE ) capsule 40 mg  40 mg Oral Daily Chyann Ambrocio S, DO   40 mg at 02/28/24 9258   LORazepam  (ATIVAN ) tablet 0.5 mg  0.5 mg Oral BID Bich Mchaney S, DO   0.5 mg at 02/28/24 9258   magnesium  hydroxide (MILK OF MAGNESIA) suspension 30 mL  30 mL Oral Daily PRN Motley-Mangrum, Jadeka A, PMHNP       OLANZapine  zydis (ZYPREXA ) disintegrating tablet 7.5 mg  7.5 mg Oral QHS Lachlan Pelto S, DO   7.5 mg at 02/27/24 2116   traZODone  (DESYREL ) tablet 50 mg  50 mg Oral QHS PRN Raliegh Marsa RAMAN, DO        Lab Results:  No results found for this or any previous visit (from the past 48 hours).   Blood Alcohol level:  Lab Results  Component Value Date   [facility] <15 02/22/2024   ETH <15  01/28/2024    Metabolic Disorder Labs: Lab Results  Component Value Date   HGBA1C 5.4 02/04/2024   MPG 108 02/04/2024   MPG 103 07/15/2016   No results found for: PROLACTIN Lab Results  Component Value Date   CHOL 153 02/04/2024   TRIG 155 (H) 02/04/2024   HDL 29 (L) 02/04/2024   CHOLHDL 5.3 02/04/2024   VLDL 31 02/04/2024   LDLCALC 93 02/04/2024   LDLCALC 140 (H) 07/15/2016    Physical Findings: AIMS:  ,  ,  ,  ,  ,  ,   CIWA:    COWS:     Musculoskeletal: Strength & Muscle Tone: within normal limits Gait & Station: normal Patient leans: N/A  Psychiatric Specialty Exam:  Presentation  General Appearance:  Appropriate for Environment; Casual  Eye Contact: Good  Speech: Clear and Coherent; Normal Rate  Speech Volume: Normal  Handedness: Right   Mood and Affect  Mood: -- (fine)  Affect: Appropriate; Congruent   Thought Process  Thought Processes: Coherent; Goal Directed  Descriptions of Associations:Intact  Orientation:Full (Time, Place and Person)  Thought Content:Logical; WDL  History of Schizophrenia/Schizoaffective disorder:No  Duration of Psychotic Symptoms:Less than six months  Hallucinations:Hallucinations: None  Ideas of Reference:None  Suicidal Thoughts:Suicidal Thoughts: No  Homicidal Thoughts:Homicidal Thoughts: No   Sensorium  Memory: Immediate Fair  Judgment: Fair  Insight: Fair   Art therapist  Concentration: Fair  Attention Span: Fair  Recall: [employer] of Knowledge: Fair  Language: Fair   Psychomotor Activity  Psychomotor Activity: Psychomotor Activity: Normal   Assets  Assets: Communication Skills; Desire for Improvement; Physical Health; Resilience   Sleep  Sleep: Sleep: Good    Physical Exam: Physical Exam Vitals and nursing note reviewed.  Constitutional:      General: He is not in acute distress.    Appearance: Normal appearance. He is normal weight. He is  not ill-appearing or toxic-appearing.  HENT:     Head: Normocephalic and atraumatic.  Pulmonary:  Effort: Pulmonary effort is normal.  Musculoskeletal:        General: Normal range of motion.  Neurological:     General: No focal deficit present.     Mental Status: He is alert.    Review of Systems  Respiratory:  Negative for cough and shortness of breath.   Cardiovascular:  Negative for chest pain.  Gastrointestinal:  Positive for heartburn. Negative for abdominal pain, constipation, diarrhea, nausea and vomiting.  Neurological:  Negative for dizziness, weakness and headaches.  Psychiatric/Behavioral:  Negative for depression, hallucinations and suicidal ideas. The patient is not nervous/anxious.    Blood pressure 122/86, pulse 80, temperature 98.2 F (36.8 C), temperature source Oral, resp. rate 18, height 5' 10 (1.778 m), weight 78 kg, SpO2 100%. Body mass index is 24.68 kg/m.   Treatment Plan Summary: Daily contact with patient to assess and evaluate symptoms and progress in treatment and Medication management  Geroge Gilliam is a 43 yr old male who presented under IVC to [facility] on 7/21 due to concerns for fasting and not drinking or eating, he was admitted to [facility] on 7/22.  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    Sabastien has continued to tolerate the medication changes.  We will increase his Vyvanse  to 40 mg today.  We will plan to further increase his Vyvanse  tomorrow and reduce the frequency of his Ativan  tomorrow.  We will increase his Famotidine  to address his acid reflux.  We will continue to monitor.    MDD, Recurrent, Severe, w/psychosis w/ Catatonic features: -Continue Zyprexa  7.5 mg QHS for  psychosis -Continue Ativan  0.5 mg BID for Catatonia -Increase Vyvanse  to 40 mg daily for low energy -Continue Agitation Protocol: Haldol /Ativan /Benadryl      -Increase Famotidine  to 40 mg tomorrow for acid reflux -Continue PRN's: Tylenol , Maalox, Atarax , Milk of Magnesia, Trazodone    --  The risks/benefits/side-effects/alternatives to medications were discussed in detail with the patient and time was given for questions. The patient consents to medication trials.                -- Metabolic profile and EKG monitoring obtained while on an atypical antipsychotic (BMI:24.68 CMP: WNL except BUN:22,  CBC: WNL except  RBC: 6.07,  Hem: 12.7,  MCV: 73.8,  MCH: 20.9,  MCHC: 28.3,  RDW: 20.2,  UDS: THC positive, EKG: Sinus Rhythm w/ Qtc: 419 A1c(7/3): 5.4,  Lipid Panel(7/3): WNL except Trig: 155, HDL: 29              -- Encouraged patient to participate in unit milieu and in scheduled group therapies              -- Short Term Goals: Ability to identify changes in lifestyle to reduce recurrence of condition will improve, Ability to verbalize feelings will improve, Ability to disclose and discuss suicidal ideas, Ability to demonstrate self-control will improve, Ability to identify and develop effective coping behaviors will improve, Ability to maintain clinical measurements within normal limits will improve, Compliance with prescribed medications will improve, and Ability to identify triggers associated with substance abuse/mental health issues will improve             -- Long Term Goals: Improvement in symptoms so as ready for discharge   Safety and Monitoring:             --  Involuntary admission to inpatient psychiatric unit for safety, stabilization and treatment             -- Daily contact with patient to assess and evaluate symptoms and progress in treatment             -- Patient's case to be discussed in multi-disciplinary team meeting             -- Observation Level : q15 minute checks              -- Vital signs:  q12 hours             -- Precautions: suicide, elopement, and assault  Discharge Planning:              -- Social work and case management to assist with discharge planning and identification of hospital follow-up needs prior to discharge             -- Estimated LOS: 2-3 more days             -- Discharge Concerns: Need to establish a safety plan; Medication compliance and effectiveness             -- Discharge Goals: Return home with outpatient referrals for mental health follow-up including medication management/psychotherapy   Marsa GORMAN Rosser, DO 02/28/2024, 12:49 PM

## 2024-02-28 NOTE — Progress Notes (Signed)
   02/28/24 0800  Psych Admission Type (Psych Patients Only)  Admission Status Involuntary  Psychosocial Assessment  Patient Complaints None  Eye Contact Fair  Facial Expression Animated  Affect Appropriate to circumstance  Speech Logical/coherent  Interaction Assertive  Motor Activity Slow  Appearance/Hygiene Unremarkable  Behavior Characteristics Appropriate to situation  Mood Pleasant  Thought Process  Coherency WDL  Content WDL  Delusions None reported or observed  Perception WDL  Hallucination None reported or observed  Judgment Impaired  Confusion None  Danger to Self  Current suicidal ideation? Denies  Description of Suicide Plan No Plan  Agreement Not to Harm Self Yes  Description of Agreement Verbal  Danger to Others  Danger to Others None reported or observed

## 2024-02-29 ENCOUNTER — Encounter (HOSPITAL_COMMUNITY): Payer: Self-pay

## 2024-02-29 DIAGNOSIS — F333 Major depressive disorder, recurrent, severe with psychotic symptoms: Secondary | ICD-10-CM | POA: Diagnosis not present

## 2024-02-29 MED ORDER — LORAZEPAM 0.5 MG PO TABS
0.5000 mg | ORAL_TABLET | Freq: Every day | ORAL | Status: DC
  Administered 2024-02-29 – 2024-03-01 (×2): 0.5 mg via ORAL
  Filled 2024-02-29 (×2): qty 1

## 2024-02-29 MED ORDER — LISDEXAMFETAMINE DIMESYLATE 30 MG PO CAPS
60.0000 mg | ORAL_CAPSULE | Freq: Every day | ORAL | Status: DC
  Administered 2024-02-29 – 2024-03-01 (×2): 60 mg via ORAL
  Filled 2024-02-29 (×2): qty 2

## 2024-02-29 NOTE — Plan of Care (Signed)
   Problem: Education: Goal: Emotional status will improve Outcome: Progressing Goal: Mental status will improve Outcome: Progressing Goal: Verbalization of understanding the information provided will improve Outcome: Progressing   Problem: Activity: Goal: Interest or engagement in activities will improve Outcome: Progressing

## 2024-02-29 NOTE — Group Note (Signed)
 Recreation Therapy Group Note   Group Topic:Health and Wellness  Group Date: 02/29/2024 Start Time: 0930 End Time: 0955 Facilitators: Chioke Noxon-McCall, LRT,CTRS Location: [address] Dayroom   Group Topic: Wellness   Goal Area(s) Addresses:  Patient will define components of whole wellness. Patient will verbalize benefit of whole wellness.   Behavioral Response:    Intervention: Music   Activity: LRT and patients engaged in a series of exercises. Patients took turns leading the group in various exercises of their choosing. The easiness or difficulty of the exercises were dependent on what each patient chose to do. LRT and patients engaged in exercise for at least 15 minutes. Patients could get water if needed or take breaks when needed as well.    Education: Wellness, Building control surveyor.    Education Outcome: Acknowledges education/In group clarification offered/Needs additional education.    Affect/Mood: N/A   Participation Level: Did not attend    Clinical Observations/Individualized Feedback:      Plan: Continue to engage patient in RT group sessions 2-3x/week.   Kerington Hildebrant-McCall, LRT,CTRS 02/29/2024 1:28 PM

## 2024-02-29 NOTE — Progress Notes (Signed)
   02/29/24 2200  Psych Admission Type (Psych Patients Only)  Admission Status Involuntary  Psychosocial Assessment  Patient Complaints None  Eye Contact Fair  Facial Expression Anxious  Affect Appropriate to circumstance  Speech Logical/coherent  Interaction Assertive  Motor Activity Slow  Appearance/Hygiene Unremarkable  Behavior Characteristics Appropriate to situation  Mood Pleasant  Aggressive Behavior  Effect No apparent injury  Thought Process  Coherency WDL  Content WDL  Delusions None reported or observed  Perception WDL  Hallucination None reported or observed  Judgment Impaired  Confusion WDL  Danger to Self  Current suicidal ideation? Denies   (Sleep Hours) -8 (Any PRNs that were needed, meds refused, or side effects to meds)- none (Any disturbances and when (visitation, over night)-none (Concerns raised by the patient)- none (SI/HI/AVH)-denied

## 2024-02-29 NOTE — Group Note (Signed)
 Date:  02/29/2024 Time:  9:02 PM  Group Topic/Focus:  Wrap-Up Group:   The focus of this group is to help patients review their daily goal of treatment and discuss progress on daily workbooks.    Participation Level:  Active  Participation Quality:  Appropriate  Affect:  Appropriate  Cognitive:  Appropriate  Insight: Appropriate  Engagement in Group:  Improving  Modes of Intervention:  Discussion  Additional Comments:  pt attended group  Leontina Skidmore E Haydn Hutsell 02/29/2024, 9:02 PM

## 2024-02-29 NOTE — [facility] Group Notes (Signed)
 Spiritual care group on grief and loss facilitated by Chaplain Rockie Sofia, [facility]  Group Goal: Support / Education around grief and loss  Members engage in facilitated group support and psycho-social education.  Group Description:  Following introductions and group rules, group members engaged in facilitated group dialogue and support around topic of loss, with particular support around experiences of loss in their lives. Group Identified types of loss (relationships / self / things) and identified patterns, circumstances, and changes that precipitate losses. Reflected on thoughts / feelings around loss, normalized grief responses, and recognized variety in grief experience. Group encouraged individual reflection on safe space and on the coping skills that they are already utilizing.  Group drew on Adlerian / Rogerian and narrative framework  Patient Progress: Benjamin [name] attended group and actively engaged and participated in group conversation and activities.  Comments demonstrated good insight and contributed positively to the group conversation.

## 2024-02-29 NOTE — BH IP Treatment Plan (Signed)
 Interdisciplinary Treatment and Diagnostic Plan Update  02/29/2024 Time of Session: UPDATE Benjamin [name] MRN: [id number]  Principal Diagnosis: MDD (major depressive disorder), recurrent, severe, with psychosis (HCC)  Secondary Diagnoses: Principal Problem:   MDD (major depressive disorder), recurrent, severe, with psychosis (HCC) Active Problems:   Catatonic withdrawn type   Current Medications:  Current Facility-Administered Medications  Medication Dose Route Frequency Provider Last Rate Last Admin   acetaminophen  (TYLENOL ) tablet 650 mg  650 mg Oral Q6H PRN Motley-Mangrum, Jadeka A, PMHNP       alum & mag hydroxide-simeth (MAALOX/MYLANTA) 200-200-20 MG/5ML suspension 30 mL  30 mL Oral Q4H PRN Motley-Mangrum, Jadeka A, PMHNP   30 mL at 02/28/24 0851   haloperidol  (HALDOL ) tablet 5 mg  5 mg Oral TID PRN Motley-Mangrum, Jadeka A, PMHNP       And   diphenhydrAMINE  (BENADRYL ) capsule 50 mg  50 mg Oral TID PRN Motley-Mangrum, Jadeka A, PMHNP       haloperidol  lactate (HALDOL ) injection 5 mg  5 mg Intramuscular TID PRN Motley-Mangrum, Jadeka A, PMHNP       And   diphenhydrAMINE  (BENADRYL ) injection 50 mg  50 mg Intramuscular TID PRN Motley-Mangrum, Jadeka A, PMHNP       And   LORazepam  (ATIVAN ) injection 2 mg  2 mg Intramuscular TID PRN Motley-Mangrum, Jadeka A, PMHNP       haloperidol  lactate (HALDOL ) injection 10 mg  10 mg Intramuscular TID PRN Motley-Mangrum, Jadeka A, PMHNP       And   diphenhydrAMINE  (BENADRYL ) injection 50 mg  50 mg Intramuscular TID PRN Motley-Mangrum, Jadeka A, PMHNP       And   LORazepam  (ATIVAN ) injection 2 mg  2 mg Intramuscular TID PRN Motley-Mangrum, Jadeka A, PMHNP       famotidine  (PEPCID ) tablet 40 mg  40 mg Oral Daily Pashayan, Alexander S, DO   40 mg at 02/29/24 0805   lisdexamfetamine (VYVANSE ) capsule 60 mg  60 mg Oral Daily Pashayan, Alexander S, DO   60 mg at 02/29/24 0805   LORazepam  (ATIVAN ) tablet 0.5 mg  0.5 mg Oral Q0600 Pashayan, Alexander S,  DO   0.5 mg at 02/29/24 0805   magnesium  hydroxide (MILK OF MAGNESIA) suspension 30 mL  30 mL Oral Daily PRN Motley-Mangrum, Jadeka A, PMHNP       OLANZapine  zydis (ZYPREXA ) disintegrating tablet 7.5 mg  7.5 mg Oral QHS Pashayan, Alexander S, DO   7.5 mg at 02/28/24 2042   traZODone  (DESYREL ) tablet 50 mg  50 mg Oral QHS PRN Pashayan, Alexander S, DO       PTA Medications: Medications Prior to Admission  Medication Sig Dispense Refill Last Dose/Taking   clonazePAM  (KLONOPIN ) 0.5 MG tablet Take 1 tablet (0.5 mg total) by mouth every 12 (twelve) hours for 7 days. (Patient not taking: Reported on 02/22/2024) 14 tablet 0    lisdexamfetamine (VYVANSE ) 70 MG capsule Take 1 capsule (70 mg total) by mouth daily. (Patient not taking: Reported on 02/22/2024)      OLANZapine  (ZYPREXA ) 20 MG tablet Take 1 tablet (20 mg total) by mouth at bedtime. (Patient not taking: Reported on 02/22/2024) 30 tablet 0     Patient Stressors:    Patient Strengths:    Treatment Modalities: Medication Management, Group therapy, Case management,  1 to 1 session with clinician, Psychoeducation, Recreational therapy.   Physician Treatment Plan for Primary Diagnosis: MDD (major depressive disorder), recurrent, severe, with psychosis (HCC) Long Term Goal(s): Improvement in symptoms so as ready  for discharge   Short Term Goals: Ability to identify changes in lifestyle to reduce recurrence of condition will improve Ability to verbalize feelings will improve Ability to identify and develop effective coping behaviors will improve Ability to maintain clinical measurements within normal limits will improve Compliance with prescribed medications will improve  Medication Management: Evaluate patient's response, side effects, and tolerance of medication regimen.  Therapeutic Interventions: 1 to 1 sessions, Unit Group sessions and Medication administration.  Evaluation of Outcomes: Adequate for Discharge  Physician Treatment Plan  for Secondary Diagnosis: Principal Problem:   MDD (major depressive disorder), recurrent, severe, with psychosis (HCC) Active Problems:   Catatonic withdrawn type  Long Term Goal(s): Improvement in symptoms so as ready for discharge   Short Term Goals: Ability to identify changes in lifestyle to reduce recurrence of condition will improve Ability to verbalize feelings will improve Ability to identify and develop effective coping behaviors will improve Ability to maintain clinical measurements within normal limits will improve Compliance with prescribed medications will improve     Medication Management: Evaluate patient's response, side effects, and tolerance of medication regimen.  Therapeutic Interventions: 1 to 1 sessions, Unit Group sessions and Medication administration.  Evaluation of Outcomes: Adequate for Discharge   RN Treatment Plan for Primary Diagnosis: MDD (major depressive disorder), recurrent, severe, with psychosis (HCC) Long Term Goal(s): Knowledge of disease and therapeutic regimen to maintain health will improve  Short Term Goals: Ability to participate in decision making will improve, Ability to identify and develop effective coping behaviors will improve, and Compliance with prescribed medications will improve  Medication Management: RN will administer medications as ordered by provider, will assess and evaluate patient's response and provide education to patient for prescribed medication. RN will report any adverse and/or side effects to prescribing provider.  Therapeutic Interventions: 1 on 1 counseling sessions, Psychoeducation, Medication administration, Evaluate responses to treatment, Monitor vital signs and CBGs as ordered, Perform/monitor CIWA, COWS, AIMS and Fall Risk screenings as ordered, Perform wound care treatments as ordered.  Evaluation of Outcomes: Adequate for Discharge   LCSW Treatment Plan for Primary Diagnosis: MDD (major depressive disorder),  recurrent, severe, with psychosis (HCC) Long Term Goal(s): Safe transition to appropriate next level of care at discharge, Engage patient in therapeutic group addressing interpersonal concerns.  Short Term Goals: Engage patient in aftercare planning with referrals and resources, Increase emotional regulation, Facilitate acceptance of mental health diagnosis and concerns, and Identify triggers associated with mental health/substance abuse issues  Therapeutic Interventions: Assess for all discharge needs, 1 to 1 time with Social worker, Explore available resources and support systems, Assess for adequacy in community support network, Educate family and significant other(s) on suicide prevention, Complete Psychosocial Assessment, Interpersonal group therapy.  Evaluation of Outcomes: Adequate for Discharge   Progress in Treatment: Attending groups: No. Participating in groups: No. Taking medication as prescribed: Yes. Toleration medication: Yes. Family/Significant other contact made: patient declined consents Patient understands diagnosis: Yes. Discussing patient identified problems/goals with staff: Yes. Medical problems stabilized or resolved: Yes. Denies suicidal/homicidal ideation: Yes. Issues/concerns per patient self-inventory: No.   New problem(s) identified:  No   New Short Term/Long Term Goal(s):   Patient Goals:  I want to get better and gain some weight after fasting.    Discharge Plan or Barriers:  Patient recently admitted. CSW will continue to follow and assess for appropriate referrals and possible discharge planning.    Reason for Continuation of Hospitalization: Depression Medication stabilization   Estimated Length of Stay:  1-2  DAYS  Last 3 [place] Suicide Severity Risk Score: Flowsheet Row [facility] Most recent reading at 02/22/2024 11:00 PM ED from 02/22/2024 in [facility] at [facility] Most recent reading at 02/22/2024  9:27 AM Admission (Discharged) [facility] Most recent reading at 02/02/2024  9:20 PM  C-SSRS RISK CATEGORY No Risk No Risk Error: Question 2 not populated    Last PHQ 2/9 Scores:     No data to display          Scribe for Treatment Team: Iyanla Eilers N Hermila Millis, LCSW 02/29/2024 5:18 PM

## 2024-02-29 NOTE — Progress Notes (Signed)
 [facility] MD Progress Note  02/29/2024 2:06 PM Jonhatan Hearty  MRN:  [id number] Subjective:   Benjamin [name] is a 43 yr old male who presented under IVC to [facility] on 7/21 due to concerns for fasting and not drinking or eating, he was admitted to [facility] on 7/22.  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    Case was discussed in the multidisciplinary team. MAR was reviewed and patient was compliant with medications.  He did received PRN Maalox yesterday.   Psychiatric Team made the following recommendations yesterday: -Continue Zyprexa  7.5 mg QHS for psychosis -Continue Ativan  0.5 mg BID for Catatonia -Increase Vyvanse  to 40 mg daily for low energy    On interview today patient reports he slept good last night.  He reports his appetite is doing good.  He reports no SI, HI, or AVH.  He reports no Paranoia or Ideas of Reference.  He reports no issues with his medications.  Discussed with him that we had proceed with the increase in his Vyvanse  and the decrease of his Ativan  and he reports tolerating them well.  Discussed with him that since he has an appointment scheduled with his outpatient provider we will plan to discharge him tomorrow in time to make that and he is agreeable.  He reports no other concerns at present.    Principal Problem: MDD (major depressive disorder), recurrent, severe, with psychosis (HCC) Diagnosis: Principal Problem:   MDD (major depressive disorder), recurrent, severe, with psychosis (HCC) Active Problems:   Catatonic withdrawn type  Total Time spent with patient:  I personally spent 35 minutes on the unit in direct patient care. The direct patient care time included face-to-face time with the patient,  reviewing the patient's chart, communicating with other professionals, and coordinating care. Greater than 50% of this time was spent in counseling or coordinating care with the patient regarding goals of hospitalization, psycho-education, and discharge planning needs.   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   Past Medical History:  Past Medical History:  Diagnosis Date   Depression    Hematemesis 08/2017   Herniated disc    x3   Hypertension    Mental disorder     Past Surgical History:  Procedure Laterality Date   ESOPHAGOGASTRODUODENOSCOPY Left 08/27/2017   Procedure: ESOPHAGOGASTRODUODENOSCOPY (EGD);  Surgeon: Rollin Dover, MD;  Location: [facility] ENDOSCOPY;  Service: Endoscopy;  Laterality: Left;   SHOULDER SURGERY     WISDOM TOOTH EXTRACTION     Family History:  Family History  Problem Relation Age of Onset   Seizures Mother    Stroke Mother    Diabetes Mellitus II Mother    Family Psychiatric  History:  Father- Suicide Brother- Depression  Social History:  Social History   Substance and Sexual Activity  Alcohol Use No     Social History   Substance and Sexual Activity  Drug Use Yes   Types: Marijuana    Social History   Socioeconomic History   Marital status: Single    Spouse name: Not on file   Number of children: Not  on file   Years of education: Not on file   Highest education level: Not on file  Occupational History   Not on file  Tobacco Use   Smoking status: Former    Current packs/day: 0.00    Types: Cigarettes    Quit date: 04/04/2012    Years since quitting: 11.9   Smokeless tobacco: Never   Tobacco comments:    Pt is nonverbal. Unknown.   Vaping Use   Vaping status:  Never Used  Substance and Sexual Activity   Alcohol use: No   Drug use: Yes    Types: Marijuana   Sexual activity: Never  Other Topics Concern   Not on file  Social History Narrative   Not on file   Social Drivers of Health   Financial Resource Strain: Not on file  Food Insecurity: Patient Declined (02/22/2024)   Hunger Vital Sign    Worried About Running Out of Food in the Last Year: Patient declined    Ran Out of Food in the Last Year: Patient declined  Recent Concern: Food Insecurity - Food Insecurity Present (01/28/2024)   Hunger Vital Sign    Worried About Running Out of Food in the Last Year: Sometimes true    Ran Out of Food in the Last Year: Sometimes true  Transportation Needs: No Transportation Needs (02/22/2024)   PRAPARE - Administrator, Civil Service (Medical): No    Lack of Transportation (Non-Medical): No  Physical Activity: Not on file  Stress: Not on file (06/11/2023)  Social Connections: Unknown (01/28/2024)   Social Connection and Isolation Panel    Frequency of Communication with Friends and Family: Patient unable to answer    Frequency of Social Gatherings with Friends and Family: Patient unable to answer    Attends Religious Services: Patient unable to answer    Active Member of Clubs or Organizations: Patient unable to answer    Attends Banker Meetings: Patient unable to answer    Marital Status: Married   Additional Social History:                         Sleep: Good Estimated Sleeping Duration (Last 24 Hours): 7.25-8.25 hours  Appetite:  Good  Current Medications: Current Facility-Administered Medications  Medication Dose Route Frequency Provider Last Rate Last Admin   acetaminophen  (TYLENOL ) tablet 650 mg  650 mg Oral Q6H PRN Motley-Mangrum, Jadeka A, PMHNP       alum & mag hydroxide-simeth (MAALOX/MYLANTA) 200-200-20 MG/5ML suspension 30 mL  30 mL Oral Q4H PRN Motley-Mangrum, Jadeka A, PMHNP   30 mL at  02/28/24 0851   haloperidol  (HALDOL ) tablet 5 mg  5 mg Oral TID PRN Motley-Mangrum, Jadeka A, PMHNP       And   diphenhydrAMINE  (BENADRYL ) capsule 50 mg  50 mg Oral TID PRN Motley-Mangrum, Jadeka A, PMHNP       haloperidol  lactate (HALDOL ) injection 5 mg  5 mg Intramuscular TID PRN Motley-Mangrum, Jadeka A, PMHNP       And   diphenhydrAMINE  (BENADRYL ) injection 50 mg  50 mg Intramuscular TID PRN Motley-Mangrum, Jadeka A, PMHNP       And   LORazepam  (ATIVAN ) injection 2 mg  2 mg Intramuscular TID PRN Motley-Mangrum, Jadeka A, PMHNP       haloperidol  lactate (HALDOL ) injection 10 mg  10 mg Intramuscular TID PRN Motley-Mangrum, Jadeka A, PMHNP       And   diphenhydrAMINE  (BENADRYL )  injection 50 mg  50 mg Intramuscular TID PRN Motley-Mangrum, Jadeka A, PMHNP       And   LORazepam  (ATIVAN ) injection 2 mg  2 mg Intramuscular TID PRN Motley-Mangrum, Jadeka A, PMHNP       famotidine  (PEPCID ) tablet 40 mg  40 mg Oral Daily Aquita Simmering S, DO   40 mg at 02/29/24 0805   lisdexamfetamine (VYVANSE ) capsule 60 mg  60 mg Oral Daily Eiliana Drone S, DO   60 mg at 02/29/24 9194   LORazepam  (ATIVAN ) tablet 0.5 mg  0.5 mg Oral Q0600 Finis Hendricksen S, DO   0.5 mg at 02/29/24 0805   magnesium  hydroxide (MILK OF MAGNESIA) suspension 30 mL  30 mL Oral Daily PRN Motley-Mangrum, Jadeka A, PMHNP       OLANZapine  zydis (ZYPREXA ) disintegrating tablet 7.5 mg  7.5 mg Oral QHS Hermelinda Diegel S, DO   7.5 mg at 02/28/24 2042   traZODone  (DESYREL ) tablet 50 mg  50 mg Oral QHS PRN Raliegh Marsa RAMAN, DO        Lab Results:  No results found for this or any previous visit (from the past 48 hours).   Blood Alcohol level:  Lab Results  Component Value Date   [facility] <15 02/22/2024   ETH <15 01/28/2024    Metabolic Disorder Labs: Lab Results  Component Value Date   HGBA1C 5.4 02/04/2024   MPG 108 02/04/2024   MPG 103 07/15/2016   No results found for: PROLACTIN Lab Results  Component  Value Date   CHOL 153 02/04/2024   TRIG 155 (H) 02/04/2024   HDL 29 (L) 02/04/2024   CHOLHDL 5.3 02/04/2024   VLDL 31 02/04/2024   LDLCALC 93 02/04/2024   LDLCALC 140 (H) 07/15/2016    Physical Findings: AIMS:  ,  ,  ,  ,  ,  ,   CIWA:    COWS:     Musculoskeletal: Strength & Muscle Tone: within normal limits Gait & Station: normal Patient leans: N/A  Psychiatric Specialty Exam:  Presentation  General Appearance:  Appropriate for Environment; Casual  Eye Contact: Good  Speech: Clear and Coherent; Normal Rate  Speech Volume: Normal  Handedness: Right   Mood and Affect  Mood: Euthymic  Affect: Appropriate; Congruent   Thought Process  Thought Processes: Coherent; Goal Directed  Descriptions of Associations:Intact  Orientation:Full (Time, Place and Person)  Thought Content:Logical; WDL  History of Schizophrenia/Schizoaffective disorder:No  Duration of Psychotic Symptoms:Less than six months  Hallucinations:Hallucinations: None  Ideas of Reference:None  Suicidal Thoughts:Suicidal Thoughts: No  Homicidal Thoughts:Homicidal Thoughts: No   Sensorium  Memory: Immediate Fair  Judgment: Fair  Insight: Fair   Art therapist  Concentration: Fair  Attention Span: Fair  Recall: [employer] of Knowledge: Fair  Language: Fair   Psychomotor Activity  Psychomotor Activity: Psychomotor Activity: Normal   Assets  Assets: Communication Skills; Desire for Improvement; Physical Health; Resilience   Sleep  Sleep: Sleep: Good    Physical Exam: Physical Exam Vitals and nursing note reviewed.  Constitutional:      General: He is not in acute distress.    Appearance: Normal appearance. He is normal weight. He is not ill-appearing or toxic-appearing.  HENT:     Head: Normocephalic and atraumatic.  Pulmonary:     Effort: Pulmonary effort is normal.  Musculoskeletal:        General: Normal range of motion.   Neurological:     General: No focal deficit present.  Mental Status: He is alert.    Review of Systems  Respiratory:  Negative for cough and shortness of breath.   Cardiovascular:  Negative for chest pain.  Gastrointestinal:  Negative for abdominal pain, constipation, diarrhea, nausea and vomiting.  Neurological:  Negative for dizziness, weakness and headaches.  Psychiatric/Behavioral:  Negative for depression, hallucinations and suicidal ideas. The patient is not nervous/anxious.    Blood pressure 117/86, pulse 87, temperature 97.8 F (36.6 C), temperature source Oral, resp. rate 20, height 5' 10 (1.778 m), weight 78 kg, SpO2 97%. Body mass index is 24.68 kg/m.   Treatment Plan Summary: Daily contact with patient to assess and evaluate symptoms and progress in treatment and Medication management  Dennis Hegeman is a 43 yr old male who presented under IVC to [facility] on 7/21 due to concerns for fasting and not drinking or eating, he was admitted to [facility] on 7/22.  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    Ramon has tolerated the increase in his Vyvanse  and decrease in frequency to his Ativan .  If he continues to tolerate these changes we will plan for discharge tomorrow morning so he can make his outpatient appointment at 11 AM.  We will continue to monitor.    MDD, Recurrent, Severe, w/psychosis w/ Catatonic features: -Continue Zyprexa  7.5 mg QHS for psychosis -Decrease Ativan  frequency 0.5 mg to daily for Catatonia -Increase Vyvanse  to 60 mg daily for low energy -Continue Agitation Protocol: Haldol /Ativan /Benadryl      -Continue Famotidine  40 mg for acid reflux -Continue PRN's: Tylenol , Maalox, Atarax , Milk  of Magnesia, Trazodone    --  The risks/benefits/side-effects/alternatives to medications were discussed in detail with the patient and time was given for questions. The patient consents to medication trials.                -- Metabolic profile and EKG monitoring obtained while on an atypical antipsychotic (BMI:24.68 CMP: WNL except BUN:22,  CBC: WNL except  RBC: 6.07,  Hem: 12.7,  MCV: 73.8,  MCH: 20.9,  MCHC: 28.3,  RDW: 20.2,  UDS: THC positive, EKG: Sinus Rhythm w/ Qtc: 419 A1c(7/3): 5.4,  Lipid Panel(7/3): WNL except Trig: 155, HDL: 29              -- Encouraged patient to participate in unit milieu and in scheduled group therapies              -- Short Term Goals: Ability to identify changes in lifestyle to reduce recurrence of condition will improve, Ability to verbalize feelings will improve, Ability to disclose and discuss suicidal ideas, Ability to demonstrate self-control will improve, Ability to identify and develop effective coping behaviors will improve, Ability to maintain clinical measurements within normal limits will improve, Compliance with prescribed medications will improve, and Ability to identify triggers associated with substance abuse/mental health issues will improve             -- Long Term Goals: Improvement in symptoms so as ready for discharge   Safety and Monitoring:             -- Involuntary admission to inpatient psychiatric unit for safety, stabilization and treatment             -- Daily contact with patient to assess and evaluate symptoms and  progress in treatment             -- Patient's case to be discussed in multi-disciplinary team meeting             -- Observation Level : q15 minute checks             -- Vital signs:  q12 hours             -- Precautions: suicide, elopement, and assault  Discharge Planning:              -- Social work and case management to assist with discharge planning and identification of hospital follow-up needs prior to discharge              -- Estimated LOS: 1 more day             -- Discharge Concerns: Need to establish a safety plan; Medication compliance and effectiveness             -- Discharge Goals: Return home with outpatient referrals for mental health follow-up including medication management/psychotherapy   Marsa GORMAN Rosser, DO 02/29/2024, 2:06 PM

## 2024-02-29 NOTE — Progress Notes (Signed)
   02/29/24 0800  Psych Admission Type (Psych Patients Only)  Admission Status Involuntary  Psychosocial Assessment  Patient Complaints None  Eye Contact Fair  Facial Expression Anxious  Affect Appropriate to circumstance  Speech Logical/coherent  Interaction Assertive  Motor Activity Slow  Appearance/Hygiene Unremarkable  Behavior Characteristics Appropriate to situation  Mood Pleasant  Thought Process  Coherency WDL  Content WDL  Delusions None reported or observed  Perception WDL  Hallucination None reported or observed  Judgment Impaired  Confusion WDL  Danger to Self  Current suicidal ideation? Denies  Description of Suicide Plan No Plan  Agreement Not to Harm Self Yes  Description of Agreement Verbal  Danger to Others  Danger to Others None reported or observed

## 2024-02-29 NOTE — [facility] Group Notes (Signed)
 Type of Therapy:  Group Topic/ Focus: Goals Group: The focus of this group is to help patients establish daily goals to achieve during treatment and discuss how the patient can incorporate goal setting into their daily lives to aide in recovery.    Participation Level:  Active   Participation Quality:  Appropriate   Affect:  Appropriate   Cognitive:  Appropriate   Insight:  Appropriate   Engagement in Group:  Engaged   Modes of Intervention:  Discussion   Summary of Progress/Problems:   Patient attended and participated goals group today. No SI/HI. Patient's goal for today is to work to be discharged on Wednesday.

## 2024-02-29 NOTE — Plan of Care (Signed)

## 2024-02-29 NOTE — Progress Notes (Signed)
(  Sleep Hours) - 8  (Any PRNs that were needed, meds refused, or side effects to meds)- denies  (Any disturbances and when (visitation, over night)- none  (Concerns raised by the patient)- denies  (SI/HI/AVH)- denies

## 2024-03-01 DIAGNOSIS — F333 Major depressive disorder, recurrent, severe with psychotic symptoms: Secondary | ICD-10-CM | POA: Diagnosis not present

## 2024-03-01 MED ORDER — FAMOTIDINE 40 MG PO TABS: 40.0000 mg | ORAL_TABLET | Freq: Every day | ORAL | 0 refills | Status: AC

## 2024-03-01 MED ORDER — LISDEXAMFETAMINE DIMESYLATE 60 MG PO CAPS: 60.0000 mg | ORAL_CAPSULE | Freq: Every day | ORAL | Status: AC

## 2024-03-01 MED ORDER — TRAZODONE HCL 50 MG PO TABS: 50.0000 mg | ORAL_TABLET | Freq: Every evening | ORAL | 0 refills | Status: AC | PRN

## 2024-03-01 MED ORDER — OLANZAPINE 15 MG PO TBDP: 7.5000 mg | ORAL_TABLET | Freq: Every day | ORAL | 0 refills | Status: AC

## 2024-03-01 MED ORDER — LORAZEPAM 0.5 MG PO TABS: 0.5000 mg | ORAL_TABLET | Freq: Every day | ORAL | 0 refills | Status: AC

## 2024-03-01 NOTE — Discharge Summary (Signed)
 Physician Discharge Summary Note  Patient:  Benjamin [name] is an 43 y.o., male MRN:  [id number] DOB:  05/11/1981 Patient phone:  [phone] (home)  Patient address:   [address] [place] [address],  Total Time spent with patient: 30 minutes  Date of Admission:  02/22/2024 Date of Discharge: 03/01/24  Reason for Admission:  as per H&P: 43 yr old male who presented under IVC to [facility] on 7/21 due to concerns for fasting and not drinking or eating, he was admitted to [facility] on 7/22.  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    When asked what brought him to the hospital he reports that some of his friends were concerned about him.  When asked why he reports that he has been fasting with a goal of not eating or drinking for 40 days.  He reports he is doing this to cleanse himself for God.  When asked about his medications after he was discharged earlier this month from the hospital he reports he stopped taking them as soon as he got home.  When asked why he did this he reports he does not know.  When asked what his family thought of this he reports that he has had no contact with his family.  He reports that he is not doing well today.  He reports that he is nauseous, dizzy, and has a headache.  When asked if he would consider restarting his medications he reports that he would.  When asked if he would drink he reports he would drink something to take his medications.  When asked if family members could be contacted he reports that they cannot.  He reports no other concerns at present.   He then did not want to answer any other questions.  When asked if any significant changes to his health and happened since his previous  hospitalization he reported no.  Information was obtained from previous H&P.   From H&P Dr. Lynnette 7/2- Collateral information:  Contacted Finnegan Gatta, Brother at [phone] on 01/29/2024   Helped patient get admitted -- drove down from [place]. Patient is typically a very hardworking individual, great dad with two kids. Ten years ago, patient's wife was a bad person and patient had a psychotic break in 2016. At that time, he had just left his ex-wife and did not speak with anyone for a month. Had lost 70 lbs and not eaten for a month. Completely non-verbal. Hyper-religious: told brother to fast. Was brought up in the church, but this is different from baseline. 3 weeks ago, mother was admitted into hospice. May have been the reason for patient's current psychotic break. At some point over that time stopped taking his meds. Unsure of names, but on an antipsychotic and mood stabilizer. Had severe paranoia (people listening in). If I were to guess, he stopped taking his meds about 1 month ago. Was acting different on fathers day weekend, lots of religious talk. This scared brother -- would leave the kids with brother and go on long drives, all day long. Had been isolating. No firearms and no violence. Certainly not a dangerous person. Girlfriend drove down from [place] . Had to coax him into going into the hospital for the purpose of checking on his vitals. Lost ~30 lbs since father's day,  extremely significant. Very similar episode from previous. Believes he will be back to his normal self on his meds. Largest factor: mother moving from nursing home to hospice. He did confirm with brother that he stopped taking his medication. Normally, very productive. Out of the norm.   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 Remote history of Olanzapine .  Previous Med Trials: Trazodone  100 mg at bedtime, Haldol  (not helpful)  Therapy: Unknown   Prior Psych Hospitalization: 01/05/2017 -- 01/16/2017 for an extended phase of catatonia after self-discontinuing abilify  unresponsive to Ativan . Stopped talking, sleeping, eating and drinking. Started at wall, would not verbally communicate. Needed 5 treatments.  Prior Self Harm: Denies Prior Violence: Denies       Past Medical History:  Past Medical History:  Diagnosis Date   Depression    Hematemesis 08/2017   Herniated disc    x3   Hypertension    Mental disorder     Past Surgical History:  Procedure Laterality Date   ESOPHAGOGASTRODUODENOSCOPY Left 08/27/2017   Procedure: ESOPHAGOGASTRODUODENOSCOPY (EGD);  Surgeon: Rollin Dover, MD;  Location: [facility] ENDOSCOPY;  Service: Endoscopy;  Laterality: Left;   SHOULDER SURGERY     WISDOM TOOTH EXTRACTION     Family History:  Family History  Problem Relation Age of Onset   Seizures Mother    Stroke Mother    Diabetes Mellitus II Mother    Family Psychiatric  History: Family Psych History: Depression in brother Family Hx suicide: Father Social History:  Social History   Substance and Sexual Activity  Alcohol Use No     Social History   Substance and Sexual Activity  Drug Use Yes   Types: Marijuana    Social History   Socioeconomic History   Marital status: Single    Spouse name: Not on file   Number of children: Not on file    Years of education: Not on file   Highest education level: Not on file  Occupational History   Not on file  Tobacco Use   Smoking status: Former    Current packs/day: 0.00    Types: Cigarettes    Quit date: 04/04/2012    Years since quitting: 11.9   Smokeless tobacco: Never   Tobacco comments:    Pt is nonverbal. Unknown.   Vaping Use   Vaping status: Never Used  Substance and Sexual Activity   Alcohol use: No   Drug use: Yes    Types: Marijuana   Sexual activity: Never  Other Topics Concern   Not on file  Social History Narrative   Not on file   Social Drivers of Health   Financial Resource Strain: Not on file  Food Insecurity: Patient Declined (02/22/2024)   Hunger Vital Sign    Worried About Running Out of Food in the Last Year: Patient declined    Ran Out of Food in the Last Year: Patient declined  Recent Concern: Food Insecurity - Food Insecurity  Present (01/28/2024)   Hunger Vital Sign    Worried About Running Out of Food in the Last Year: Sometimes true    Ran Out of Food in the Last Year: Sometimes true  Transportation Needs: No Transportation Needs (02/22/2024)   PRAPARE - Administrator, Civil Service (Medical): No    Lack of Transportation (Non-Medical): No  Physical Activity: Not on file  Stress: Not on file (06/11/2023)  Social Connections: Unknown (01/28/2024)   Social Connection and Isolation Panel    Frequency of Communication with Friends and Family: Patient unable to answer    Frequency of Social Gatherings with Friends and Family: Patient unable to answer    Attends Religious Services: Patient unable to answer    Active Member of Clubs or Organizations: Patient unable to answer    Attends Banker Meetings: Patient unable to answer    Marital Status: Married    Hospital Course:    Patient was admitted to inpatient psychiatry at [facility] for safety and stabilization. Patient was provided safe and therapeutic milieu,  psychiatric and medical assessment, care and treatment, as well as support from nursing, behavioral health staff. Both psychotherapy and psychoeducation groups were provided. Different coping skills such as journaling, CBT and art therapy groups were offered. Additional consultation was provided by hospitalist for H&P and medical needs.  Patient was started on Zyprexa  7.5 mg at bedtime and Ativan  for psychosis and catatonia which resolved. Ativan  was tapered to 0.5 mg qdaily during the admission with good tolerance. . Patient tolerated without side effects and medications were titrated to therapeutic effect. As patient stabilized on medications and participated in therapeutic interventions, symptoms began to improve.  On the day of discharge, the chart was reviewed, case was discussed with staff and the patient was seen in person. Patient is euthymic without any evidence of psychosis, linear and goal directed speech and is organized without any evidence of psychosis. Patient is euthymic and conversant. Tells me this happened 10 years ago. I emphasized importance of him continuing to take Zyprexa  or alternative antipsychotic as he was readmitted after noncompliance and he states he understands. Patient does not present with evidence of ADHD on my evaluation and I advised against continued increases in Vyvanse  as stimulants are not recommended in catatonia or psychosis. Patient will followup with his outpatient psychiatrist today. Has denied SI or HI for >72 hours and has had no further evidence of psychosis. Patient is not voicing any delusions and is not responding to internal stimuli. Future oriented on seeing his kids.   Patient's overall mood has improved. Patient was calm and cooperative and did not appear anxious. Patient reported adequate sleep and stable mood. Patient was tolerating medications well without side effects reported or noted. Patient denied suicidal ideation, plan or intent, denied  hopelessness, helplessness or worthlessness, and denied homicidal ideation. Insight and judgement have improved. Patient demonstrated future orientation and was motivated to follow-up with aftercare. Patient was encouraged to be adherent with medications. Patient was instructed to call 911, ask for help to go to the closest emergency room or crisis center, call crisis hotlines for help if in critical status or when symptoms were worsening. Patient voiced understanding of this information. At the time of discharge, patient had reached maximum benefit from hospitalization, was no longer considered to be dangerous to self or others, and was psychiatrically stable and otherwise appropriate for discharge to less restrictive care in the community.   Medical Hospital Course:  Patient was seen by the hospitalist for routine admission examination. Medications for chronic conditions were continued.  Medical hospital course was otherwise unremarkable.    Musculoskeletal: Strength & Muscle Tone: within normal limits Gait & Station: normal Patient leans: N/A   Psychiatric Specialty Exam:  Presentation  General Appearance:  Appropriate for Environment  Eye Contact: Fair  Speech: Clear and Coherent  Speech Volume: Normal  Handedness: Right   Mood and Affect  Mood: Euthymic  Affect: Appropriate   Thought Process  Thought Processes: Goal Directed  Descriptions of Associations:Intact  Orientation:Full (Time, Place and Person)  Thought Content:Logical  History of Schizophrenia/Schizoaffective disorder:Yes  Duration of Psychotic Symptoms:Less than six months  Hallucinations:Hallucinations: None  Ideas of Reference:None  Suicidal Thoughts:Suicidal Thoughts: No  Homicidal Thoughts:Homicidal Thoughts: No   Sensorium  Memory: Immediate Fair  Judgment: Fair  Insight: Fair   Art therapist  Concentration: Fair  Attention Span: Fair  Recall: [employer] of  Knowledge: Fair  Language: Fair   Psychomotor Activity  Psychomotor Activity: Psychomotor Activity: Normal   Assets  Assets: Communication Skills; Desire for Improvement; Financial Resources/Insurance; Social Support; Resilience   Sleep  Sleep: Sleep: Fair  Estimated Sleeping Duration (Last 24 Hours): 6.25-7.25 hours   Physical Exam: Physical exam: Please see exam on admit note. General: Well developed, well nourished.  Pupils: Normal at 3mm Respiratory: Breathing is unlabored.  Cardiovascular: No edema.  Language: No anomia, no aphasia Muscle strength and tone-pt moving all extremities.  Gait not assessed as pt remained in bed.  Neuro: Facial muscles are symmetric. Pt without tremor, no evidence of hyperarousal.  Review of Systems  Constitutional: Negative.   HENT: Negative.    Eyes: Negative.   Respiratory: Negative.    Cardiovascular: Negative.   Gastrointestinal: Negative.   Genitourinary: Negative.   Musculoskeletal: Negative.   Skin: Negative.   Neurological: Negative.   Endo/Heme/Allergies: Negative.   Psychiatric/Behavioral: Negative.     Blood pressure 110/78, pulse 84, temperature (!) 97.5 F (36.4 C), temperature source Oral, resp. rate 18, height 5' 10 (1.778 m), weight 78 kg, SpO2 98%. Body mass index is 24.68 kg/m.   Social History   Tobacco Use  Smoking Status Former   Current packs/day: 0.00   Types: Cigarettes   Quit date: 04/04/2012   Years since quitting: 11.9  Smokeless Tobacco Never  Tobacco Comments   Pt is nonverbal. Unknown.    Tobacco Cessation:  N/A, patient does not currently use tobacco products   Blood Alcohol level:  Lab Results  Component Value Date   [facility] <15 02/22/2024   ETH <15 01/28/2024    Metabolic Disorder Labs:  Lab Results  Component Value Date   HGBA1C 5.4 02/04/2024   MPG 108 02/04/2024   MPG 103 07/15/2016   No results found for: PROLACTIN Lab Results  Component Value Date   CHOL 153  02/04/2024   TRIG 155 (H) 02/04/2024   HDL 29 (L) 02/04/2024   CHOLHDL 5.3 02/04/2024   VLDL 31 02/04/2024   LDLCALC 93 02/04/2024   LDLCALC 140 (H) 07/15/2016    See Psychiatric Specialty Exam and Suicide Risk Assessment completed by Attending Physician prior to discharge.  Discharge destination:  Home  Is patient on multiple antipsychotic therapies at discharge:  No   Has Patient had three or more failed trials of antipsychotic monotherapy by history:  No  Recommended Plan for Multiple Antipsychotic Therapies: NA  Discharge Instructions     Diet - low sodium heart healthy  Complete by: As directed       Allergies as of 03/01/2024   No Known Allergies      Medication List     STOP taking these medications    clonazePAM  0.5 MG tablet Commonly known as: KLONOPIN    OLANZapine  20 MG tablet Commonly known as: ZYPREXA  Replaced by: OLANZapine  zydis 15 MG disintegrating tablet       TAKE these medications      Indication  famotidine  40 MG tablet Commonly known as: PEPCID  Take 1 tablet (40 mg total) by mouth daily. Start taking on: March 02, 2024  Indication: Gastroesophageal Reflux Disease   lisdexamfetamine 60 MG capsule Commonly known as: VYVANSE  Take 1 capsule (60 mg total) by mouth daily. Start taking on: March 02, 2024 What changed:  medication strength how much to take  Indication: Attention Deficit Hyperactivity Disorder   LORazepam  0.5 MG tablet Commonly known as: ATIVAN  Take 1 tablet (0.5 mg total) by mouth daily at 6 (six) AM. Start taking on: March 02, 2024  Indication: Catatonia   OLANZapine  zydis 15 MG disintegrating tablet Commonly known as: ZYPREXA  Take 0.5 tablets (7.5 mg total) by mouth at bedtime. Replaces: OLANZapine  20 MG tablet  Indication: Psychotic Depressive Illness   traZODone  50 MG tablet Commonly known as: DESYREL  Take 1 tablet (50 mg total) by mouth at bedtime as needed for sleep.  Indication: Trouble Sleeping         Follow-up Information     Tasia Lung, MD. Go on 03/01/2024.   Specialty: Psychiatry Why: You have an appointment for medication management services on 03/01/24 at 11:00 am .  You also have an appointment for therapy services on 03/04/24 at 1:00 pm, in person.  You have an outstanding balance with this provider, for which you will need to set up a payment plan. Contact information: [address] [place] [address] [phone]                 Follow-up recommendations:  outpatient psychiatric followup today at 11.  Signed: Orene Abbasi, MD 03/01/2024, 9:41 AM

## 2024-03-01 NOTE — Progress Notes (Signed)
 Patient is discharging at this time. Patient is A&Ox4. Stable. Patient denies SI,HI, and A/V/H with no plan/intent. Printed AVS reviewed with and given to patient along with medications and follow up appointments. Suicide safety plan complete with copy provided to patient. Original form in chart. Patient verbalized all understanding. All valuables/belongings returned to patient. Patient is being transported by taxi. Patient denies any pain/discomfort. No s/s of current distress.

## 2024-03-01 NOTE — Progress Notes (Signed)
  [facility] Adult Case Management Discharge Plan :  Will you be returning to the same living situation after discharge:  Yes,  pt returning home at discharge At discharge, do you have transportation home?: Yes,  csw arranged [employer] for 1015AM Do you have the ability to pay for your medications: Yes,  pt has active health insurance coverage  Release of information consent forms completed and in the chart;  Patient's signature needed at discharge.  Patient to Follow up at:  Follow-up Information     Tasia Lung, MD. Go on 03/01/2024.   Specialty: Psychiatry Why: You have an appointment for medication management services on 03/01/24 at 11:00 am .  You also have an appointment for therapy services on 03/04/24 at 1:00 pm, in person.  You have an outstanding balance with this provider, for which you will need to set up a payment plan. Contact information: [address] [place] [address] [phone]                 Next level of care provider has access to [facility] Link:no  Safety Planning and Suicide Prevention discussed: Yes,  completed with patient at discharge     Has patient been referred to the Quitline?: Patient refused referral for treatment  Patient has been referred for addiction treatment: No known substance use disorder.  Jenkins LULLA Primer, LCSWA 03/01/2024, 9:21 AM

## 2024-03-01 NOTE — [facility] Suicide Risk Assessment (Signed)
 [facility] Discharge Suicide Risk Assessment   Principal Problem: MDD (major depressive disorder), recurrent, severe, with psychosis (HCC) Discharge Diagnoses: Principal Problem:   MDD (major depressive disorder), recurrent, severe, with psychosis (HCC) Active Problems:   Catatonic withdrawn type   Total Time spent with patient: 30 minutes  Musculoskeletal: Strength & Muscle Tone: within normal limits Gait & Station: normal Patient leans: N/A  Psychiatric Specialty Exam  Presentation  General Appearance:  Appropriate for Environment  Eye Contact: Fair  Speech: Clear and Coherent  Speech Volume: Normal  Handedness: Right   Mood and Affect  Mood: Euthymic  Duration of Depression Symptoms: No data recorded Affect: Appropriate   Thought Process  Thought Processes: Goal Directed  Descriptions of Associations:Intact  Orientation:Full (Time, Place and Person)  Thought Content:Logical  History of Schizophrenia/Schizoaffective disorder:Yes  Duration of Psychotic Symptoms:Less than six months  Hallucinations:Hallucinations: None  Ideas of Reference:None  Suicidal Thoughts:Suicidal Thoughts: No  Homicidal Thoughts:Homicidal Thoughts: No   Sensorium  Memory: Immediate Fair  Judgment: Fair  Insight: Fair   Art therapist  Concentration: Fair  Attention Span: Fair  Recall: [employer] of Knowledge: Fair  Language: Fair   Psychomotor Activity  Psychomotor Activity: Psychomotor Activity: Normal   Assets  Assets: Communication Skills; Desire for Improvement; Financial Resources/Insurance; Social Support; Resilience   Sleep  Sleep: Sleep: Fair  Estimated Sleeping Duration (Last 24 Hours): 6.25-7.25 hours  Physical Exam: Physical Exam ROS Blood pressure 110/78, pulse 84, temperature (!) 97.5 F (36.4 C), temperature source Oral, resp. rate 18, height 5' 10 (1.778 m), weight 78 kg, SpO2 98%. Body mass index is 24.68  kg/m.  Mental Status Per Nursing Assessment::   On Admission:  NA  Demographic Factors:  Male, Caucasian, and Living alone  Loss Factors: NA  Historical Factors: NA  Risk Reduction Factors:   Sense of responsibility to family, Employed, Positive social support, Positive therapeutic relationship, and Positive coping skills or problem solving skills  Continued Clinical Symptoms:  Previous Psychiatric Diagnoses and Treatments  Cognitive Features That Contribute To Risk:  None    Suicide Risk:  Minimal: No identifiable suicidal ideation.  Patients presenting with no risk factors but with morbid ruminations; may be classified as minimal risk based on the severity of the depressive symptoms  Patient denies SI or HI for >48 hours. Denies wanting to be dead and future oriented. SRA complete and acute risk for suicide is low.   [place] Suicide Risk assessment:  1. Do you wish to be dead? NONE REPORTED 2. Have you wished your dead or wished you could go to sleep and not wake up? NONE REPORTED 3.  Have you actually had thoughts of killing yourself?  NONE REPORTED 4.  Have you been thinking about how you might do this?  NONE REPORTED 5.  Have you had these thoughts and some intention of acting on them? NONE REPORTED 6.  Have you started to work out or worked out the details to kill yourself? NONE REPORTED 7.  Do you intend to carry out this plan? NONE REPORTED 8. On a scale of 1-5 with 1 being the least severe and 5 being the most severe answer the following questions place for intensity of ideation. ZERO 9. How many times have you had these thoughts? NONE REPORTED 10. When you have the thoughts how long to the last?  NONE REPORTED 11. Control ability.  Could you or can you stop thinking about killing herself or wanting to die if you want to?  YES 12. Are there any things anyone or anything family religion pain of death that stop you from wanting to die or acting on thoughts of  committing suicide?  FAMILY 13.  What sort of reason to do have to think about wanting to die or killing yourself? NONE REPORTED 14.Was it to end the pain or stop the way you are feeling in other words you could not go on living with his pain or how you are feeling or was not to get attention revenge or reaction from others?  Or both?  NONE REPORTED  [place] Suicide Risk assessment:  1. Do you wish to be dead? NONE REPORTED 2. Have you wished your dead or wished you could go to sleep and not wake up? NONE REPORTED 3.  Have you actually had thoughts of killing yourself?  NONE REPORTED 4.  Have you been thinking about how you might do this?  NONE REPORTED 5.  Have you had these thoughts and some intention of acting on them? NONE REPORTED 6.  Have you started to work out or worked out the details to kill yourself? NONE REPORTED 7.  Do you intend to carry out this plan? NONE REPORTED 8. On a scale of 1-5 with 1 being the least severe and 5 being the most severe answer the following questions place for intensity of ideation. ZERO 9. How many times have you had these thoughts? NONE REPORTED 10. When you have the thoughts how long to the last?  NONE REPORTED 11. Control ability.  Could you or can you stop thinking about killing herself or wanting to die if you want to?  YES 12. Are there any things anyone or anything family religion pain of death that stop you from wanting to die or acting on thoughts of committing suicide?  FAMILY 13.  What sort of reason to do have to think about wanting to die or killing yourself? NONE REPORTED 14.Was it to end the pain or stop the way you are feeling in other words you could not go on living with his pain or how you are feeling or was not to get attention revenge or reaction from others?  Or both?  NONE REPORTED    Follow-up Information     Tasia Lung, MD. Go on 03/01/2024.   Specialty: Psychiatry Why: You have an appointment for medication management  services on 03/01/24 at 11:00 am .  You also have an appointment for therapy services on 03/04/24 at 1:00 pm, in person.  You have an outstanding balance with this provider, for which you will need to set up a payment plan. Contact information: [address] [place] [address] [phone]                 Plan Of Care/Follow-up recommendations:  Outpatient psychiatric followup at 11 today  Cherita Hebel, MD 03/01/2024, 9:41 AM

## 2024-03-01 NOTE — Transportation (Signed)
 03/01/2024  Benjamin [name] DOB: 05-01-1981 MRN: [id number]   RIDER WAIVER AND RELEASE OF LIABILITY  For the purposes of helping with transportation needs, [facility] partners with outside transportation providers (taxi companies, [place], Catering manager.) to give [facility] patients or other approved people the choice of on-demand rides Public librarian) to our buildings for non-emergency visits.  By using [employer], I, the person signing this document, on behalf of myself and/or any legal minors (in my care using the [employer]), agree:  Science writer given to me are supplied by independent, outside transportation providers who do not work for, or have any affiliation with, [employer]. [facility] is not a transportation company. [facility] has no control over the quality or safety of the rides I get using [employer]. [facility] has no control over whether any outside ride will happen on time or not. [facility] gives no guarantee on the reliability, quality, safety, or availability on any rides, or that no mistakes will happen. I know and accept that traveling by vehicle (car, truck, SVU, fleeta, bus, taxi, etc.) has risks of serious injuries such as disability, being paralyzed, and death. I know and agree the risk of using [employer] is mine alone, and not [employer]. [employer] are provided as is and as are available. The transportation providers are in charge for all inspections and care of the vehicles used to provide these rides. I agree not to take legal action against [facility], its agents, employees, officers, directors, representatives, insurers, attorneys, assigns, successors, subsidiaries, and affiliates at any time for any reasons related directly or indirectly to using [employer]. I also agree not to take legal action against  or its affiliates for any injury, death, or damage to property caused by or related to using  [employer]. I have read this Waiver and Release of Liability, and I understand the terms used in it and their legal meaning. This Waiver is freely and voluntarily given with the understanding that my right (or any legal minors) to legal action against  relating to [employer] is knowingly given up to use these services.   I attest that I read the Ride Waiver and Release of Liability to Benjamin [name], gave Mr. Kriz the opportunity to ask questions and answered the questions asked (if any). I affirm that Kaydin Kincy then provided consent for assistance with transportation.

## 2024-03-01 NOTE — Group Note (Signed)
 Date:  03/01/2024 Time:  9:47 AM  Group Topic/Focus:  Goals Group:   The focus of this group is to help patients establish daily goals to achieve during treatment and discuss how the patient can incorporate goal setting into their daily lives to aide in recovery.    Participation Level:  Active  Participation Quality:  Appropriate  Affect:  Appropriate  Cognitive:  Appropriate  Insight: Appropriate  Engagement in Group:  Engaged  Modes of Intervention:  Activity  Additional Comments:    Cambell Rickenbach 03/01/2024, 9:47 AM

## 2024-08-19 ENCOUNTER — Emergency Department (HOSPITAL_COMMUNITY)

## 2024-08-19 ENCOUNTER — Emergency Department (HOSPITAL_COMMUNITY)
Admission: EM | Admit: 2024-08-19 | Discharge: 2024-08-19 | Disposition: A | Discharge status: Home / Self Care | Attending: Emergency Medicine | Admitting: Emergency Medicine

## 2024-08-19 ENCOUNTER — Encounter (HOSPITAL_COMMUNITY): Payer: Self-pay

## 2024-08-19 DIAGNOSIS — K209 Esophagitis, unspecified without bleeding: Secondary | ICD-10-CM | POA: Diagnosis not present

## 2024-08-19 DIAGNOSIS — D72829 Elevated white blood cell count, unspecified: Secondary | ICD-10-CM | POA: Insufficient documentation

## 2024-08-19 DIAGNOSIS — R1013 Epigastric pain: Secondary | ICD-10-CM | POA: Diagnosis present

## 2024-08-19 LAB — CBC WITH DIFFERENTIAL/PLATELET
Abs Immature Granulocytes: 0.03 K/uL (ref 0.00–0.07)
Basophils Absolute: 0.1 K/uL (ref 0.0–0.1)
Basophils Relative: 1 %
Eosinophils Absolute: 0.3 K/uL (ref 0.0–0.5)
Eosinophils Relative: 3 %
HCT: 35.1 % — ABNORMAL LOW (ref 39.0–52.0)
Hemoglobin: 9.4 g/dL — ABNORMAL LOW (ref 13.0–17.0)
Immature Granulocytes: 0 %
Lymphocytes Relative: 20 %
Lymphs Abs: 2.1 K/uL (ref 0.7–4.0)
MCH: 18.1 pg — ABNORMAL LOW (ref 26.0–34.0)
MCHC: 26.8 g/dL — ABNORMAL LOW (ref 30.0–36.0)
MCV: 67.5 fL — ABNORMAL LOW (ref 80.0–100.0)
Monocytes Absolute: 0.6 K/uL (ref 0.1–1.0)
Monocytes Relative: 6 %
Neutro Abs: 7.8 K/uL — ABNORMAL HIGH (ref 1.7–7.7)
Neutrophils Relative %: 70 %
Platelets: 458 K/uL — ABNORMAL HIGH (ref 150–400)
RBC: 5.2 MIL/uL (ref 4.22–5.81)
RDW: 19.1 % — ABNORMAL HIGH (ref 11.5–15.5)
Smear Review: NORMAL
WBC: 11 K/uL — ABNORMAL HIGH (ref 4.0–10.5)
nRBC: 0 % (ref 0.0–0.2)

## 2024-08-19 LAB — LIPASE, BLOOD: Lipase: 29 U/L (ref 11–51)

## 2024-08-19 LAB — COMPREHENSIVE METABOLIC PANEL WITH GFR
ALT: 27 U/L (ref 0–44)
AST: 21 U/L (ref 15–41)
Albumin: 4.8 g/dL (ref 3.5–5.0)
Alkaline Phosphatase: 64 U/L (ref 38–126)
Anion gap: 12 (ref 5–15)
BUN: 10 mg/dL (ref 6–20)
CO2: 21 mmol/L — ABNORMAL LOW (ref 22–32)
Calcium: 9.6 mg/dL (ref 8.9–10.3)
Chloride: 105 mmol/L (ref 98–111)
Creatinine, Ser: 0.86 mg/dL (ref 0.61–1.24)
GFR, Estimated: >60 mL/min
Glucose, Bld: 98 mg/dL (ref 70–99)
Potassium: 3.9 mmol/L (ref 3.5–5.1)
Sodium: 138 mmol/L (ref 135–145)
Total Bilirubin: 0.3 mg/dL (ref 0.0–1.2)
Total Protein: 8.4 g/dL — ABNORMAL HIGH (ref 6.5–8.1)

## 2024-08-19 LAB — TYPE AND SCREEN
ABO/RH(D): A POS
Antibody Screen: NEGATIVE

## 2024-08-19 LAB — POC OCCULT BLOOD, ED: Fecal Occult Bld: NEGATIVE

## 2024-08-19 MED ORDER — PANTOPRAZOLE SODIUM 40 MG PO TBEC: 40.0000 mg | DELAYED_RELEASE_TABLET | Freq: Two times a day (BID) | ORAL | 0 refills | Status: AC

## 2024-08-19 MED ORDER — PANTOPRAZOLE SODIUM 40 MG IV SOLR
40.0000 mg | Freq: Once | INTRAVENOUS | Status: AC
  Administered 2024-08-19: 40 mg via INTRAVENOUS
  Filled 2024-08-19: qty 10

## 2024-08-19 MED ORDER — LIDOCAINE VISCOUS HCL 2 % MT SOLN
15.0000 mL | Freq: Once | OROMUCOSAL | Status: AC
  Administered 2024-08-19: 15 mL via ORAL
  Filled 2024-08-19: qty 15

## 2024-08-19 MED ORDER — SUCRALFATE 1 G PO TABS: 1.0000 g | ORAL_TABLET | Freq: Three times a day (TID) | ORAL | 0 refills | Status: AC

## 2024-08-19 MED ORDER — FAMOTIDINE IN NACL 20-0.9 MG/50ML-% IV SOLN
20.0000 mg | Freq: Once | INTRAVENOUS | Status: AC
  Administered 2024-08-19: 20 mg via INTRAVENOUS
  Filled 2024-08-19: qty 50

## 2024-08-19 MED ORDER — ALUM & MAG HYDROXIDE-SIMETH 200-200-20 MG/5ML PO SUSP
30.0000 mL | Freq: Once | ORAL | Status: AC
  Administered 2024-08-19: 30 mL via ORAL
  Filled 2024-08-19: qty 30

## 2024-08-19 MED ORDER — MORPHINE SULFATE (PF) 4 MG/ML IV SOLN
4.0000 mg | Freq: Once | INTRAVENOUS | Status: AC
  Administered 2024-08-19: 4 mg via INTRAVENOUS
  Filled 2024-08-19: qty 1

## 2024-08-19 MED ORDER — METOCLOPRAMIDE HCL 5 MG/ML IJ SOLN
10.0000 mg | Freq: Once | INTRAMUSCULAR | Status: AC
  Administered 2024-08-19: 10 mg via INTRAVENOUS
  Filled 2024-08-19: qty 2

## 2024-08-19 MED ORDER — IOHEXOL 300 MG/ML  SOLN
100.0000 mL | Freq: Once | INTRAMUSCULAR | Status: AC | PRN
  Administered 2024-08-19: 100 mL via INTRAVENOUS

## 2024-08-19 MED ORDER — ONDANSETRON HCL 4 MG/2ML IJ SOLN
4.0000 mg | Freq: Once | INTRAMUSCULAR | Status: AC
  Administered 2024-08-19: 4 mg via INTRAVENOUS
  Filled 2024-08-19: qty 2

## 2024-08-19 NOTE — ED Provider Notes (Signed)
 " Benjamin [name]   CSN: [id number] Arrival date & time: 08/19/24  9091     Patient presents with: Abdominal Pain   Benjamin [name] is a 44 y.o. male.   Patient is a 44 year old male with a past medical history of PUD presenting to the emergency department with epigastric pain.  Patient states that he has had epigastric pain for the last 2 months.  He states that he was restarted on PPI and had scheduled follow-up with GI that is coming up at the end of this month.  He states over the last few weeks his pain has become significantly worse.  He was seen at an outside ED 1 week ago and was recommended to take Protonix  and Maalox and states that despite this his pain is only getting worse.  He states that his pain is constant and worse anytime he swallows anything including his pills or water and is worse when he is lying on his right side.  He states he has associated nausea and has vomited 3 times this week.  He states that he has seen small blood clots in his vomit.  He states that his stool have appeared black and tarry.  He states that he had peptic ulcers several years ago and this pain feels the same.  He denies any blood thinner use, tobacco or alcohol use.  The history is provided by the patient.  Abdominal Pain      Prior to Admission medications  Medication Sig Start Date End Date Taking? Authorizing Provider  pantoprazole  (PROTONIX ) 40 MG tablet Take 1 tablet (40 mg total) by mouth 2 (two) times daily. 08/19/24 09/18/24 Yes Kingsley, Alaynna Kerwood K, DO  sucralfate  (CARAFATE ) 1 g tablet Take 1 tablet (1 g total) by mouth 4 (four) times daily -  with meals and at bedtime. 08/19/24  Yes Ellouise, Misbah Hornaday K, DO  famotidine  (PEPCID ) 40 MG tablet Take 1 tablet (40 mg total) by mouth daily. 03/02/24   Zouev, Dmitri, MD  lisdexamfetamine  (VYVANSE ) 60 MG capsule Take 1 capsule (60 mg total) by mouth daily. 03/02/24   Zouev, Dmitri, MD   LORazepam  (ATIVAN ) 0.5 MG tablet Take 1 tablet (0.5 mg total) by mouth daily at 6 (six) AM. 03/02/24   Zouev, Dmitri, MD  OLANZapine  zydis (ZYPREXA ) 15 MG disintegrating tablet Take 0.5 tablets (7.5 mg total) by mouth at bedtime. 03/01/24   Zouev, Dmitri, MD  traZODone  (DESYREL ) 50 MG tablet Take 1 tablet (50 mg total) by mouth at bedtime as needed for sleep. 03/01/24   Zouev, Dmitri, MD    Allergies: Patient has no known allergies.    Review of Systems  Gastrointestinal:  Positive for abdominal pain.    Updated Vital Signs BP 117/74 (BP Location: Left Arm)   Pulse 91   Temp 98 F (36.7 C) (Oral)   Resp 18   SpO2 100%   Physical Exam Vitals and nursing [name] reviewed. Exam conducted with a chaperone present Jethro Kitty NT).  Constitutional:      General: He is not in acute distress.    Appearance: He is well-developed.  HENT:     Head: Normocephalic and atraumatic.     Mouth/Throat:     Mouth: Mucous membranes are moist.     Pharynx: Oropharynx is clear.  Eyes:     General: No scleral icterus.    Extraocular Movements: Extraocular movements intact.  Cardiovascular:     Rate and Rhythm: Normal rate  and regular rhythm.     Heart sounds: Normal heart sounds.  Pulmonary:     Effort: Pulmonary effort is normal.     Breath sounds: Normal breath sounds.  Abdominal:     General: Abdomen is flat.     Palpations: Abdomen is soft.     Tenderness: There is abdominal tenderness in the epigastric area. There is no guarding or rebound.  Genitourinary:    Rectum: Normal.     Comments: Light brown stool in vault  Skin:    General: Skin is warm and dry.  Neurological:     General: No focal deficit present.     Mental Status: He is alert and oriented to person, place, and time.  Psychiatric:        Mood and Affect: Mood normal.        Behavior: Behavior normal.     (all labs ordered are listed, but only abnormal results are displayed) Labs Reviewed  CBC WITH  DIFFERENTIAL/PLATELET - Abnormal; Notable for the following components:      Result Value   WBC 11.0 (*)    Hemoglobin 9.4 (*)    HCT 35.1 (*)    MCV 67.5 (*)    MCH 18.1 (*)    MCHC 26.8 (*)    RDW 19.1 (*)    Platelets 458 (*)    Neutro Abs 7.8 (*)    All other components within normal limits  COMPREHENSIVE METABOLIC PANEL WITH GFR - Abnormal; Notable for the following components:   CO2 21 (*)    Total Protein 8.4 (*)    All other components within normal limits  LIPASE, BLOOD  POC OCCULT BLOOD, ED  TYPE AND SCREEN    EKG: None  Radiology: CT ABDOMEN PELVIS W CONTRAST Result Date: 08/19/2024 EXAM: CT ABDOMEN AND PELVIS WITH CONTRAST 08/19/2024 12:24:24 PM TECHNIQUE: CT of the abdomen and pelvis was performed with the administration of 100 mL of iohexol  (OMNIPAQUE ) 300 MG/ML solution. Multiplanar reformatted images are provided for review. Automated exposure control, iterative reconstruction, and/or weight-based adjustment of the mA/kV was utilized to reduce the radiation dose to as low as reasonably achievable. COMPARISON: 08/26/2017 CLINICAL HISTORY: Abdominal pain, acute, nonlocalized. FINDINGS: LOWER CHEST: Mild circumferential wall thickening of the distal esophagus. LIVER: The liver is unremarkable. GALLBLADDER AND BILE DUCTS: Gallbladder is unremarkable. No biliary ductal dilatation. SPLEEN: No acute abnormality. PANCREAS: No acute abnormality. ADRENAL GLANDS: No acute abnormality. KIDNEYS, URETERS AND BLADDER: No stones in the kidneys or ureters. No hydronephrosis. No perinephric or periureteral stranding. The urinary bladder is distended and without focal abnormality. GI AND BOWEL: Decompressed stomach. Gas filled nondistended normal appendix. There is no bowel obstruction. PERITONEUM AND RETROPERITONEUM: No ascites. No free air. No free pelvic fluid. VASCULATURE: Aorta is normal in caliber. Scattered aortoiliac atherosclerosis. LYMPH NODES: No lymphadenopathy. REPRODUCTIVE  ORGANS: No prostatomegaly. BONES AND SOFT TISSUES: No acute osseous abnormality. No focal soft tissue abnormality. IMPRESSION: 1. Mild circumferential wall thickening of the distal esophagus, which may be due to underdistention or seen with esophagitis. Electronically signed by: Rogelia Myers MD 08/19/2024 01:15 PM EST RP Workstation: [id number]     Procedures   Medications Ordered in the ED  alum & mag hydroxide-simeth (MAALOX/MYLANTA) 200-200-20 MG/5ML suspension 30 mL (has no administration in time range)    And  lidocaine  (XYLOCAINE ) 2 % viscous mouth solution 15 mL (has no administration in time range)  famotidine  (PEPCID ) IVPB 20 mg premix (has no administration in time range)  ondansetron  (  ZOFRAN ) injection 4 mg (4 mg Intravenous Given 08/19/24 1015)  pantoprazole  (PROTONIX ) injection 40 mg (40 mg Intravenous Given 08/19/24 1014)  metoCLOPramide  (REGLAN ) injection 10 mg (10 mg Intravenous Given 08/19/24 1208)  morphine  (PF) 4 MG/ML injection 4 mg (4 mg Intravenous Given 08/19/24 1208)  iohexol  (OMNIPAQUE ) 300 MG/ML solution 100 mL (100 mLs Intravenous Contrast Given 08/19/24 1216)    Clinical Course as of 08/19/24 1510  Fri Aug 19, 2024  1118 Hgb stable from last week, mild leykocytosis, otherwise labs within normal range. Hemoccult negative. Without signs of bleeding ulcer and uncontrolled pain, would recommend imaging to evaluate for alternative etiology of pain prior to contacting GI. [VK]  1354 CT with findings of esophagitis. With uncontrolled pain, will discuss with GI.  [VK]  1500 I spoke with Dr. Saintclair with GI who states with no active bleeding does not have indication for admission. Recommended stopping marijuana use, PPI BID and Carafate  QID with outpatient follow up as scheduled.  [VK]    Clinical Course User Index [VK] Kingsley, Sonam Wandel K, DO                                 Medical Decision Making This patient presents to the ED with chief complaint(s) of epigastric pain  with pertinent past medical history of PUD which further complicates the presenting complaint. The complaint involves an extensive differential diagnosis and also carries with it a high risk of complications and morbidity.    The differential diagnosis includes gastritis, GERD, PUD, hepatitis, pancreatitis, cholelithiasis, cholecystitis, GI bleed, anemia, coagulopathy  Additional history obtained: Additional history obtained from N/A Records reviewed Care Everywhere/External Records  ED Course and Reassessment: On patient's arrival he is hemodynamically stable in no acute distress.  Patient will have labs to evaluate for anemia or other etiology of his pain.  Will Hemoccult to evaluate for GI bleed.  Was started on IV PPI and given Zofran  for symptomatic management and will be closely reassessed.  Independent labs interpretation:  The following labs were independently interpreted: mild leukocytosis, otherwise stable from recent labs  Independent visualization of imaging: - I independently visualized the following imaging with scope of interpretation limited to determining acute life threatening conditions related to emergency care: CTAP, which revealed esophagitis, otherwise no acute disease  Consultation: - Consulted or discussed management/test interpretation w/ external professional: GI  Consideration for admission or further workup: Patient has no emergent conditions requiring admission or further work-up at this time and is stable for discharge home with GI follow-up  Social Determinants of health: N/A    Amount and/or Complexity of Data Reviewed Labs: ordered. Radiology: ordered.  Risk OTC drugs. Prescription drug management.       Final diagnoses:  Esophagitis    ED Discharge Orders          Ordered    pantoprazole  (PROTONIX ) 40 MG tablet  2 times daily        08/19/24 1508    sucralfate  (CARAFATE ) 1 g tablet  3 times daily with meals & bedtime        08/19/24  1508               Kingsley, Asif Muchow K, DO 08/19/24 1510  "

## 2024-08-19 NOTE — Discharge Instructions (Signed)
 You were seen in the emergency department for your abdominal pain.  Your CT did show some inflammation of your esophagus but no signs of active bleeding at this time.  You should follow-up with GI as an outpatient as scheduled.  You should take Protonix  twice daily and take Carafate  4 times daily with meals and at bedtime.  You can continue to take the Zofran  as needed for nausea.  You should return to the emergency department if you are having significantly worsening pain, increased bleeding, you become lightheaded or pass out or any other new or concerning symptoms.

## 2024-08-19 NOTE — ED Triage Notes (Signed)
 Pt presents with c/o abdominal pain secondary to a bleeding stomach ulcer per pt. Pt reports black tarry stools.
